# Patient Record
Sex: Male | Born: 1945 | Race: White | Hispanic: No | Marital: Single | Smoking: Former smoker
Health system: Southern US, Community
[De-identification: ages and names within clinical notes are randomized; demographics above are authoritative.]

## PROBLEM LIST (undated history)

## (undated) DIAGNOSIS — U071 COVID-19: Secondary | ICD-10-CM

## (undated) DIAGNOSIS — J189 Pneumonia, unspecified organism: Secondary | ICD-10-CM

## (undated) DIAGNOSIS — G479 Sleep disorder, unspecified: Secondary | ICD-10-CM

## (undated) DIAGNOSIS — J449 Chronic obstructive pulmonary disease, unspecified: Secondary | ICD-10-CM

## (undated) DIAGNOSIS — Z87442 Personal history of urinary calculi: Secondary | ICD-10-CM

## (undated) DIAGNOSIS — F419 Anxiety disorder, unspecified: Secondary | ICD-10-CM

## (undated) DIAGNOSIS — I10 Essential (primary) hypertension: Secondary | ICD-10-CM

## (undated) DIAGNOSIS — M109 Gout, unspecified: Secondary | ICD-10-CM

## (undated) DIAGNOSIS — K219 Gastro-esophageal reflux disease without esophagitis: Secondary | ICD-10-CM

## (undated) DIAGNOSIS — M1990 Unspecified osteoarthritis, unspecified site: Secondary | ICD-10-CM

## (undated) DIAGNOSIS — R519 Headache, unspecified: Secondary | ICD-10-CM

## (undated) DIAGNOSIS — N400 Enlarged prostate without lower urinary tract symptoms: Secondary | ICD-10-CM

## (undated) DIAGNOSIS — Z974 Presence of external hearing-aid: Secondary | ICD-10-CM

## (undated) DIAGNOSIS — E039 Hypothyroidism, unspecified: Secondary | ICD-10-CM

## (undated) DIAGNOSIS — F32A Depression, unspecified: Secondary | ICD-10-CM

## (undated) DIAGNOSIS — G629 Polyneuropathy, unspecified: Secondary | ICD-10-CM

## (undated) DIAGNOSIS — H9209 Otalgia, unspecified ear: Secondary | ICD-10-CM

## (undated) DIAGNOSIS — D126 Benign neoplasm of colon, unspecified: Secondary | ICD-10-CM

## (undated) DIAGNOSIS — E7800 Pure hypercholesterolemia, unspecified: Secondary | ICD-10-CM

## (undated) DIAGNOSIS — G4730 Sleep apnea, unspecified: Secondary | ICD-10-CM

## (undated) DIAGNOSIS — J45909 Unspecified asthma, uncomplicated: Secondary | ICD-10-CM

## (undated) HISTORY — PX: HERNIA REPAIR: SHX51

## (undated) HISTORY — PX: OTHER SURGICAL HISTORY: SHX169

## (undated) HISTORY — PX: CHOLECYSTECTOMY: SHX55

## (undated) HISTORY — PX: COLONOSCOPY: SHX174

---

## 2007-09-03 DIAGNOSIS — E7800 Pure hypercholesterolemia, unspecified: Secondary | ICD-10-CM | POA: Insufficient documentation

## 2007-09-03 DIAGNOSIS — I10 Essential (primary) hypertension: Secondary | ICD-10-CM | POA: Insufficient documentation

## 2008-01-30 DIAGNOSIS — F339 Major depressive disorder, recurrent, unspecified: Secondary | ICD-10-CM | POA: Insufficient documentation

## 2008-01-30 DIAGNOSIS — F519 Sleep disorder not due to a substance or known physiological condition, unspecified: Secondary | ICD-10-CM | POA: Insufficient documentation

## 2010-09-02 DIAGNOSIS — G4700 Insomnia, unspecified: Secondary | ICD-10-CM | POA: Insufficient documentation

## 2012-03-20 DIAGNOSIS — B369 Superficial mycosis, unspecified: Secondary | ICD-10-CM | POA: Insufficient documentation

## 2012-06-14 DIAGNOSIS — L299 Pruritus, unspecified: Secondary | ICD-10-CM | POA: Insufficient documentation

## 2013-04-20 ENCOUNTER — Emergency Department (HOSPITAL_COMMUNITY)
Admission: EM | Admit: 2013-04-20 | Discharge: 2013-04-20 | Disposition: A | Discharge status: Home / Self Care | Payer: Medicare HMO | Attending: Emergency Medicine | Admitting: Emergency Medicine

## 2013-04-20 ENCOUNTER — Encounter (HOSPITAL_COMMUNITY): Payer: Self-pay | Admitting: Emergency Medicine

## 2013-04-20 DIAGNOSIS — Z79899 Other long term (current) drug therapy: Secondary | ICD-10-CM | POA: Insufficient documentation

## 2013-04-20 DIAGNOSIS — J45909 Unspecified asthma, uncomplicated: Secondary | ICD-10-CM | POA: Insufficient documentation

## 2013-04-20 DIAGNOSIS — E785 Hyperlipidemia, unspecified: Secondary | ICD-10-CM | POA: Insufficient documentation

## 2013-04-20 DIAGNOSIS — S838X9A Sprain of other specified parts of unspecified knee, initial encounter: Secondary | ICD-10-CM | POA: Insufficient documentation

## 2013-04-20 DIAGNOSIS — I10 Essential (primary) hypertension: Secondary | ICD-10-CM | POA: Insufficient documentation

## 2013-04-20 DIAGNOSIS — W240XXA Contact with lifting devices, not elsewhere classified, initial encounter: Secondary | ICD-10-CM | POA: Insufficient documentation

## 2013-04-20 DIAGNOSIS — Y92838 Other recreation area as the place of occurrence of the external cause: Secondary | ICD-10-CM | POA: Insufficient documentation

## 2013-04-20 DIAGNOSIS — Z8669 Personal history of other diseases of the nervous system and sense organs: Secondary | ICD-10-CM | POA: Insufficient documentation

## 2013-04-20 DIAGNOSIS — S76112A Strain of left quadriceps muscle, fascia and tendon, initial encounter: Secondary | ICD-10-CM

## 2013-04-20 DIAGNOSIS — Y9239 Other specified sports and athletic area as the place of occurrence of the external cause: Secondary | ICD-10-CM | POA: Insufficient documentation

## 2013-04-20 DIAGNOSIS — Z87448 Personal history of other diseases of urinary system: Secondary | ICD-10-CM | POA: Insufficient documentation

## 2013-04-20 DIAGNOSIS — Y9389 Activity, other specified: Secondary | ICD-10-CM | POA: Insufficient documentation

## 2013-04-20 HISTORY — DX: Unspecified asthma, uncomplicated: J45.909

## 2013-04-20 HISTORY — DX: Essential (primary) hypertension: I10

## 2013-04-20 HISTORY — DX: Benign prostatic hyperplasia without lower urinary tract symptoms: N40.0

## 2013-04-20 HISTORY — DX: Polyneuropathy, unspecified: G62.9

## 2013-04-20 HISTORY — DX: Otalgia, unspecified ear: H92.09

## 2013-04-20 HISTORY — DX: Sleep disorder, unspecified: G47.9

## 2013-04-20 HISTORY — DX: Pure hypercholesterolemia, unspecified: E78.00

## 2013-04-20 MED ORDER — NAPROXEN 500 MG PO TABS: 500.0000 mg | ORAL_TABLET | Freq: Two times a day (BID) | ORAL | Status: DC

## 2013-04-20 MED ORDER — HYDROCODONE-ACETAMINOPHEN 5-325 MG PO TABS
1.0000 | ORAL_TABLET | Freq: Once | ORAL | Status: AC
  Administered 2013-04-20: 1 via ORAL
  Filled 2013-04-20: qty 1

## 2013-04-20 MED ORDER — HYDROCODONE-ACETAMINOPHEN 5-325 MG PO TABS: 1.0000 | ORAL_TABLET | Freq: Four times a day (QID) | ORAL | Status: DC | PRN

## 2013-04-20 NOTE — ED Notes (Signed)
Pt given d/c teaching by Jamison Oka, RN. Pt does not have any further questions upon d/c. Pt instructed not to drive home. Pt states he has called his sister to come get him and she will be picking him in the lobby. Pt requests a wheelchair to lobby. Pt has ace bandage applied to left leg prior to d/c and endorses he knows how to put it on and off. NAD noted upon d/c. Pt taken to waiting room via wheelchair.

## 2013-04-20 NOTE — ED Notes (Signed)
Osie Bond, PA at the bedside

## 2013-04-20 NOTE — ED Provider Notes (Signed)
History     CSN: [id number]  Arrival date & time 04/20/13  6213   First MD Initiated Contact with Patient 04/20/13 [phone]      Chief Complaint  Patient presents with  . Leg Pain    (Consider location/radiation/quality/duration/timing/severity/associated sxs/prior treatment) HPI Dakota Harper is a 67 y.o. male who reports left leg pain for 2 days. States used elliptical machine which he normally does not use at the gym 2 days ago. States since then has had pain in left quad that is worsening. 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 States pulled over and called 911. Pt reports taking ibuprofen for pain with no relief. Denies bruising or swelling to the area. No numbness or weakness to the distal leg. No other injuries.   Past Medical History  Diagnosis Date  . Hypertension   . Hypercholesteremia   . Asthma   . Sleep disorder   . BPH (benign prostatic hyperplasia)   . Ear pain   . Neuropathy     Past Surgical History  Procedure Laterality Date  . Hernia repair      History reviewed. No pertinent family history.  History  Substance Use Topics  . Smoking status: Not on file  . Smokeless tobacco: Not on file  . Alcohol Use: Not on file      Review of Systems  Constitutional: Negative for fever and chills.  Respiratory: Negative.   Cardiovascular: Negative.   Genitourinary: Negative for dysuria and flank pain.  Musculoskeletal: Positive for myalgias and arthralgias.  Skin: Negative.   Neurological: Negative for weakness and numbness.    Allergies  Sulfa antibiotics  Home Medications   Current Outpatient Rx  Name  Route  Sig  Dispense  Refill  . LISINOPRIL PO   Oral   Take 1 tablet by mouth daily.           BP 161/74  Pulse 75  Temp(Src) 97.7 F (36.5 C) (Oral)  Resp 20  SpO2 99%  Physical Exam  Nursing note and vitals  reviewed. Constitutional: He appears well-developed and well-nourished. No distress.  Eyes: Conjunctivae are normal.  Neck: Neck supple.  Cardiovascular: Normal rate, regular rhythm and normal heart sounds.   Pulmonary/Chest: Effort normal and breath sounds normal. No respiratory distress. He has no wheezes. He has no rales.  Musculoskeletal:  Normal appearing left thigh. Tender to the distal quadricept muscle and superior patella tendon. Normal hip exam. Pt is able to raise straight leg off stretcher and hold against resistance. Full rom of the knee and hip. Negative anterior and posterior drawer signs of left knee. No laxity of pain with medial or lateral strain. Normal and equal dorsal pedal pulses bilaterally. No LE swelling. No calf tenderness bilaterally.   Neurological: He is alert.  Skin: Skin is warm and dry.    ED Course  Procedures (including critical care time)  Labs Reviewed - No data to display No results found.   1. Quadriceps strain, left, initial encounter       MDM  Pt with left thigh pain. Mild tenderness over quadricept muscle. Full rom of both hips and knee joints. Doubt deep tissue infection given mild tenderness, no skin changes, no fever. Patella tendon intact. Will treat with ACE wrap, norco at home. Suspect muscle strain.   Filed Vitals:   04/20/13 0919  BP: 161/74  Pulse:  75  Temp: 97.7 F (36.5 C)  TempSrc: Oral  Resp: 20  SpO2: 99%          Lottie Mussel, PA-C 04/20/13 1635

## 2013-04-20 NOTE — ED Notes (Signed)
Pt ambulatory in room but requests wheelchair to lobby

## 2013-04-20 NOTE — ED Provider Notes (Signed)
Medical screening examination/treatment/procedure(s) were conducted as a shared visit with non-physician practitioner(s) and myself.  I personally evaluated the patient during the encounter Pt with injury to the left leg on eliptical 2 days ago.  Today pain worsened.  Pt has pain with palpation to the distal quad.  Full ROM of the knee and hip.  No signs of infection or joint problem.  Will d/c with f/u  Gwyneth Sprout, MD 04/20/13 2054

## 2013-04-20 NOTE — ED Notes (Signed)
Per EMS: Pt states that he injured his left leg on the elliptical machine 2 days prior and starting hurting yesterday. Pt states he was unable to drive his car. VSS 140 palpated HR 90 99% RA 18 RR

## 2013-04-26 ENCOUNTER — Emergency Department (HOSPITAL_COMMUNITY)
Admission: EM | Admit: 2013-04-26 | Discharge: 2013-04-26 | Disposition: A | Discharge status: Home / Self Care | Payer: Medicare HMO | Attending: Emergency Medicine | Admitting: Emergency Medicine

## 2013-04-26 ENCOUNTER — Encounter (HOSPITAL_COMMUNITY): Payer: Self-pay | Admitting: Family Medicine

## 2013-04-26 DIAGNOSIS — G479 Sleep disorder, unspecified: Secondary | ICD-10-CM | POA: Insufficient documentation

## 2013-04-26 DIAGNOSIS — R059 Cough, unspecified: Secondary | ICD-10-CM | POA: Insufficient documentation

## 2013-04-26 DIAGNOSIS — Y93B9 Activity, other involving muscle strengthening exercises: Secondary | ICD-10-CM | POA: Insufficient documentation

## 2013-04-26 DIAGNOSIS — Z79899 Other long term (current) drug therapy: Secondary | ICD-10-CM | POA: Insufficient documentation

## 2013-04-26 DIAGNOSIS — X500XXA Overexertion from strenuous movement or load, initial encounter: Secondary | ICD-10-CM | POA: Insufficient documentation

## 2013-04-26 DIAGNOSIS — IMO0001 Reserved for inherently not codable concepts without codable children: Secondary | ICD-10-CM | POA: Insufficient documentation

## 2013-04-26 DIAGNOSIS — Y9289 Other specified places as the place of occurrence of the external cause: Secondary | ICD-10-CM | POA: Insufficient documentation

## 2013-04-26 DIAGNOSIS — R05 Cough: Secondary | ICD-10-CM

## 2013-04-26 DIAGNOSIS — Z8669 Personal history of other diseases of the nervous system and sense organs: Secondary | ICD-10-CM | POA: Insufficient documentation

## 2013-04-26 DIAGNOSIS — IMO0002 Reserved for concepts with insufficient information to code with codable children: Secondary | ICD-10-CM | POA: Insufficient documentation

## 2013-04-26 DIAGNOSIS — Z7982 Long term (current) use of aspirin: Secondary | ICD-10-CM | POA: Insufficient documentation

## 2013-04-26 DIAGNOSIS — N400 Enlarged prostate without lower urinary tract symptoms: Secondary | ICD-10-CM | POA: Insufficient documentation

## 2013-04-26 DIAGNOSIS — I10 Essential (primary) hypertension: Secondary | ICD-10-CM | POA: Insufficient documentation

## 2013-04-26 DIAGNOSIS — J45909 Unspecified asthma, uncomplicated: Secondary | ICD-10-CM | POA: Insufficient documentation

## 2013-04-26 DIAGNOSIS — S76112D Strain of left quadriceps muscle, fascia and tendon, subsequent encounter: Secondary | ICD-10-CM

## 2013-04-26 DIAGNOSIS — G589 Mononeuropathy, unspecified: Secondary | ICD-10-CM | POA: Insufficient documentation

## 2013-04-26 DIAGNOSIS — E7800 Pure hypercholesterolemia, unspecified: Secondary | ICD-10-CM | POA: Insufficient documentation

## 2013-04-26 MED ORDER — LIDOCAINE 5 % EX PTCH: 1.0000 | MEDICATED_PATCH | CUTANEOUS | Status: DC

## 2013-04-26 MED ORDER — CYCLOBENZAPRINE HCL 10 MG PO TABS: 10.0000 mg | ORAL_TABLET | Freq: Two times a day (BID) | ORAL | Status: DC | PRN

## 2013-04-26 NOTE — ED Provider Notes (Signed)
History     CSN: [id number]  Arrival date & time 04/26/13  0808   First MD Initiated Contact with Patient 04/26/13 [phone]      Chief Complaint  Patient presents with  . Leg Pain    (Consider location/radiation/quality/duration/timing/severity/associated sxs/prior treatment) HPI  Dakota Harper is a 67 y.o. male complaining of left anterior thigh pain onset approximately a week ago after strenuous exercise. Patient was seen and diagnosed with quadriceps sprain. He's been taking all Norco with little relief. Patient denies any numbness paresthesia, difficulty ambulating, new trauma or strenuous activity. Patient also reports that he has had a dry cough for several months. She denies any chest pain, shortness of breath, fever, nausea vomiting, change in bowel or bladder habits.  Past Medical History  Diagnosis Date  . Hypertension   . Hypercholesteremia   . Asthma   . Sleep disorder   . BPH (benign prostatic hyperplasia)   . Ear pain   . Neuropathy     Past Surgical History  Procedure Laterality Date  . Hernia repair      History reviewed. No pertinent family history.  History  Substance Use Topics  . Smoking status: Never Smoker   . Smokeless tobacco: Not on file  . Alcohol Use: No      Review of Systems  Constitutional:       Negative except as described in HPI  HENT:       Negative except as described in HPI  Respiratory: Positive for cough.        Negative except as described in HPI  Cardiovascular:       Negative except as described in HPI  Gastrointestinal: Negative for nausea, vomiting and diarrhea.       Negative except as described in HPI  Genitourinary:       Negative except as described in HPI  Musculoskeletal: Positive for myalgias.       Negative except as described in HPI  Skin:       Negative except as described in HPI  Neurological:       Negative except as described in HPI  All other systems reviewed and are negative.    Allergies  Saw  palmetto and Sulfa antibiotics  Home Medications   Current Outpatient Rx  Name  Route  Sig  Dispense  Refill  . acetaminophen (TYLENOL) 325 MG tablet   Oral   Take 650 mg by mouth every 6 (six) hours as needed for pain.         Marland Kitchen albuterol (PROVENTIL HFA;VENTOLIN HFA) 108 (90 BASE) MCG/ACT inhaler   Inhalation   Inhale 1 puff into the lungs every 6 (six) hours as needed for wheezing.         . Artificial Tear Ointment (DRY EYES OP)   Both Eyes   Place 1 drop into both eyes as needed (dry eyes).         Marland Kitchen aspirin EC 81 MG tablet   Oral   Take 81 mg by mouth daily.         Marland Kitchen atorvastatin (LIPITOR) 80 MG tablet   Oral   Take 80 mg by mouth daily.         . cetirizine (ZYRTEC) 10 MG tablet   Oral   Take 10 mg by mouth daily.         . clobetasol cream (TEMOVATE) 0.05 %   Topical   Apply 1 application topically 2 (two) times daily.         Marland Kitchen  DULoxetine (CYMBALTA) 60 MG capsule   Oral   Take 60 mg by mouth daily.         . Fluticasone Propionate  HFA (FLOVENT HFA IN)   Inhalation   Inhale 2 sprays into the lungs 2 (two) times daily.         Marland Kitchen gabapentin (NEURONTIN) 800 MG tablet   Oral   Take 1,600 mg by mouth 3 (three) times daily.         Marland Kitchen HYDROcodone-acetaminophen (NORCO) 5-325 MG per tablet   Oral   Take 1 tablet by mouth every 6 (six) hours as needed for pain.   20 tablet   0   . ibuprofen (ADVIL,MOTRIN) 200 MG tablet   Oral   Take 1,000 mg by mouth every 6 (six) hours as needed for pain.         Marland Kitchen lisinopril (PRINIVIL,ZESTRIL) 5 MG tablet   Oral   Take 5 mg by mouth daily.         . naproxen (NAPROSYN) 500 MG tablet   Oral   Take 1 tablet (500 mg total) by mouth 2 (two) times daily.   30 tablet   0   . PRESCRIPTION MEDICATION   Apply externally   Apply 1 application topically 2 (two) times daily as needed (dry skin). triamcinolone 1%  Menthol and camfer 1%         . QUEtiapine (SEROQUEL) 200 MG tablet   Oral   Take 200  mg by mouth at bedtime.         . tamsulosin (FLOMAX) 0.4 MG CAPS   Oral   Take 0.8 mg by mouth daily.         . urea (CARMOL) 40 % CREA   Topical   Apply 1 application topically daily.         Marland Kitchen lidocaine (LIDODERM) 5 %   Transdermal   Place 1 patch onto the skin daily. Remove & Discard patch within 12 hours or as directed by MD   5 patch   0     BP 150/81  Pulse 90  Temp(Src) 97.3 F (36.3 C)  Resp 18  SpO2 99%  Physical Exam  Nursing note and vitals reviewed. Constitutional: He is oriented to person, place, and time. He appears well-developed and well-nourished. No distress.  HENT:  Head: Normocephalic.  Mouth/Throat: Oropharynx is clear and moist.  Eyes: Conjunctivae and EOM are normal. Pupils are equal, round, and reactive to light.  Neck: Normal range of motion. Neck supple.  Cardiovascular: Normal rate, regular rhythm and normal heart sounds.   Pulmonary/Chest: Effort normal and breath sounds normal. No stridor. No respiratory distress. He has no wheezes. He has no rales. He exhibits no tenderness.  Abdominal: Soft. Bowel sounds are normal. There is no tenderness. There is no rebound and no guarding.  Musculoskeletal: Normal range of motion.  Diffusely tender to palpation of medial quadriceps insertion. Excellent range of motion to both hip and knee.  Lymphadenopathy:    He has no cervical adenopathy.  Neurological: He is alert and oriented to person, place, and time.  Psychiatric: He has a normal mood and affect.    ED Course  Procedures (including critical care time)  Labs Reviewed - No data to display No results found.   1. Quadriceps strain, left, subsequent encounter   2. Cough       MDM   Filed Vitals:   04/26/13 0820  BP: 150/81  Pulse: 90  Temp: 97.3 F (36.3 C)  Resp: 18  SpO2: 99%     Dakota Harper is a 67 y.o. male with quadriceps strain, I will give the patient must rule out Sirs and topical lidocaine. Patient will  followup with his orthopedist he has an appointment next week. I have asked him to follow with his primary care Dr. For his dry cough which has been persistent for several months. Lung sounds are clear and I see any reason for intervention at this time.   Pt is hemodynamically stable, appropriate for, and amenable to discharge at this time. Pt verbalized understanding and agrees with care plan. Outpatient follow-up and specific return precautions discussed.    Discharge Medication List as of 04/26/2013  8:34 AM    START taking these medications   Details  lidocaine (LIDODERM) 5 % Place 1 patch onto the skin daily. Remove & Discard patch within 12 hours or as directed by MD, Starting 04/26/2013, Until Discontinued, [employer], PA-C 04/27/13 [phone]

## 2013-04-26 NOTE — ED Notes (Addendum)
Per pt was dx with muscle strain a week ago and was unable to stay off of his leg but not better. sts pain meds not helping. sts also has been congested x 2 months.

## 2013-04-29 NOTE — ED Provider Notes (Signed)
Medical screening examination/treatment/procedure(s) were performed by non-physician practitioner and as supervising physician I was immediately available for consultation/collaboration.   Jian Hodgman M Wallice Granville, DO 04/29/13 1815 

## 2013-07-18 DIAGNOSIS — R053 Chronic cough: Secondary | ICD-10-CM | POA: Insufficient documentation

## 2013-08-30 DIAGNOSIS — J45909 Unspecified asthma, uncomplicated: Secondary | ICD-10-CM | POA: Insufficient documentation

## 2013-09-18 DIAGNOSIS — M109 Gout, unspecified: Secondary | ICD-10-CM | POA: Insufficient documentation

## 2013-09-22 DIAGNOSIS — D352 Benign neoplasm of pituitary gland: Secondary | ICD-10-CM | POA: Insufficient documentation

## 2013-09-26 DIAGNOSIS — E669 Obesity, unspecified: Secondary | ICD-10-CM | POA: Insufficient documentation

## 2013-10-20 DIAGNOSIS — E291 Testicular hypofunction: Secondary | ICD-10-CM | POA: Insufficient documentation

## 2014-01-09 DIAGNOSIS — H0288A Meibomian gland dysfunction right eye, upper and lower eyelids: Secondary | ICD-10-CM | POA: Insufficient documentation

## 2014-01-09 DIAGNOSIS — H25019 Cortical age-related cataract, unspecified eye: Secondary | ICD-10-CM | POA: Insufficient documentation

## 2014-01-09 DIAGNOSIS — H11153 Pinguecula, bilateral: Secondary | ICD-10-CM | POA: Insufficient documentation

## 2014-01-14 DIAGNOSIS — K8020 Calculus of gallbladder without cholecystitis without obstruction: Secondary | ICD-10-CM | POA: Insufficient documentation

## 2014-07-02 DIAGNOSIS — M7550 Bursitis of unspecified shoulder: Secondary | ICD-10-CM | POA: Insufficient documentation

## 2014-07-03 DIAGNOSIS — M79604 Pain in right leg: Secondary | ICD-10-CM | POA: Insufficient documentation

## 2014-07-03 DIAGNOSIS — R079 Chest pain, unspecified: Secondary | ICD-10-CM | POA: Insufficient documentation

## 2014-07-03 DIAGNOSIS — I8390 Asymptomatic varicose veins of unspecified lower extremity: Secondary | ICD-10-CM | POA: Insufficient documentation

## 2014-08-14 ENCOUNTER — Ambulatory Visit: Payer: Self-pay | Admitting: Podiatry

## 2014-11-19 DIAGNOSIS — M722 Plantar fascial fibromatosis: Secondary | ICD-10-CM | POA: Insufficient documentation

## 2015-06-03 ENCOUNTER — Ambulatory Visit: Payer: Self-pay | Admitting: Family Medicine

## 2015-06-24 DIAGNOSIS — L309 Dermatitis, unspecified: Secondary | ICD-10-CM | POA: Insufficient documentation

## 2015-08-20 ENCOUNTER — Emergency Department: Payer: Commercial Managed Care - HMO

## 2015-08-20 ENCOUNTER — Emergency Department
Admission: EM | Admit: 2015-08-20 | Discharge: 2015-08-20 | Disposition: A | Discharge status: Home / Self Care | Payer: Commercial Managed Care - HMO | Attending: Emergency Medicine | Admitting: Emergency Medicine

## 2015-08-20 DIAGNOSIS — I10 Essential (primary) hypertension: Secondary | ICD-10-CM | POA: Insufficient documentation

## 2015-08-20 DIAGNOSIS — Z791 Long term (current) use of non-steroidal anti-inflammatories (NSAID): Secondary | ICD-10-CM | POA: Insufficient documentation

## 2015-08-20 DIAGNOSIS — Z7951 Long term (current) use of inhaled steroids: Secondary | ICD-10-CM | POA: Insufficient documentation

## 2015-08-20 DIAGNOSIS — R1032 Left lower quadrant pain: Secondary | ICD-10-CM | POA: Diagnosis present

## 2015-08-20 DIAGNOSIS — Z79899 Other long term (current) drug therapy: Secondary | ICD-10-CM | POA: Insufficient documentation

## 2015-08-20 DIAGNOSIS — F552 Abuse of laxatives: Secondary | ICD-10-CM

## 2015-08-20 DIAGNOSIS — Z7982 Long term (current) use of aspirin: Secondary | ICD-10-CM | POA: Diagnosis not present

## 2015-08-20 LAB — BASIC METABOLIC PANEL
Anion gap: 5 (ref 5–15)
BUN: 15 mg/dL (ref 6–20)
CALCIUM: 9.2 mg/dL (ref 8.9–10.3)
CO2: 28 mmol/L (ref 22–32)
CREATININE: 1.02 mg/dL (ref 0.61–1.24)
Chloride: 107 mmol/L (ref 101–111)
GFR calc Af Amer: >60 mL/min (ref >60)
GLUCOSE: 113 mg/dL — AB (ref 65–99)
POTASSIUM: 4 mmol/L (ref 3.5–5.1)
SODIUM: 140 mmol/L (ref 135–145)

## 2015-08-20 LAB — URINALYSIS COMPLETE WITH MICROSCOPIC (ARMC ONLY)
BILIRUBIN URINE: NEGATIVE
GLUCOSE, UA: NEGATIVE mg/dL
Hgb urine dipstick: NEGATIVE
Leukocytes, UA: NEGATIVE
Nitrite: NEGATIVE
PH: 6 (ref 5.0–8.0)
Protein, ur: 30 mg/dL — AB
Specific Gravity, Urine: 1.049 — ABNORMAL HIGH (ref 1.005–1.030)

## 2015-08-20 LAB — CBC WITH DIFFERENTIAL/PLATELET
Basophils Absolute: 0 10*3/uL (ref 0–0.1)
Basophils Relative: 0 %
EOS ABS: 0.1 10*3/uL (ref 0–0.7)
EOS PCT: 2 %
HCT: 44.3 % (ref 40.0–52.0)
Hemoglobin: 14.8 g/dL (ref 13.0–18.0)
LYMPHS ABS: 0.9 10*3/uL — AB (ref 1.0–3.6)
Lymphocytes Relative: 12 %
MCH: 30.5 pg (ref 26.0–34.0)
MCHC: 33.5 g/dL (ref 32.0–36.0)
MCV: 91.2 fL (ref 80.0–100.0)
MONO ABS: 0.6 10*3/uL (ref 0.2–1.0)
Monocytes Relative: 7 %
Neutro Abs: 6.1 10*3/uL (ref 1.4–6.5)
Neutrophils Relative %: 79 %
PLATELETS: 192 10*3/uL (ref 150–440)
RBC: 4.86 MIL/uL (ref 4.40–5.90)
RDW: 13.1 % (ref 11.5–14.5)
WBC: 7.7 10*3/uL (ref 3.8–10.6)

## 2015-08-20 MED ORDER — HYDROMORPHONE HCL 1 MG/ML IJ SOLN
0.5000 mg | INTRAMUSCULAR | Status: DC | PRN
  Filled 2015-08-20: qty 1

## 2015-08-20 MED ORDER — METOCLOPRAMIDE HCL 5 MG/ML IJ SOLN
10.0000 mg | Freq: Once | INTRAMUSCULAR | Status: AC
  Administered 2015-08-20: 10 mg via INTRAVENOUS
  Filled 2015-08-20: qty 2

## 2015-08-20 MED ORDER — IOHEXOL 300 MG/ML  SOLN
100.0000 mL | Freq: Once | INTRAMUSCULAR | Status: AC | PRN
  Administered 2015-08-20: 100 mL via INTRAVENOUS

## 2015-08-20 MED ORDER — FENTANYL CITRATE (PF) 100 MCG/2ML IJ SOLN
50.0000 ug | Freq: Once | INTRAMUSCULAR | Status: AC
  Administered 2015-08-20: 50 ug via INTRAVENOUS
  Filled 2015-08-20: qty 2

## 2015-08-20 MED ORDER — SODIUM CHLORIDE 0.9 % IV BOLUS (SEPSIS)
1000.0000 mL | Freq: Once | INTRAVENOUS | Status: AC
  Administered 2015-08-20: 1000 mL via INTRAVENOUS

## 2015-08-20 MED ORDER — DICYCLOMINE HCL 20 MG PO TABS: 20.0000 mg | ORAL_TABLET | Freq: Three times a day (TID) | ORAL | Status: DC | PRN

## 2015-08-20 MED ORDER — IOHEXOL 240 MG/ML SOLN
25.0000 mL | Freq: Once | INTRAMUSCULAR | Status: DC | PRN
  Administered 2015-08-20: 25 mL via ORAL
  Filled 2015-08-20: qty 50

## 2015-08-20 NOTE — ED Notes (Signed)
Pt to CT via stretcher

## 2015-08-20 NOTE — ED Notes (Signed)
Pt called out, c/o 10/10 pain in LLQ an he is requesting pain medication,.

## 2015-08-20 NOTE — Discharge Instructions (Signed)
Your CT scan looked good. You do not have diverticulitis or Crohn's disease. You do not have significant distention or significant constipation. Your pain is likely due to the constipation you had and the aggressive bowel regimen with multiple stool softeners and laxatives. Simplify this regimen-simply take MiraLAX once a day for the next 3 days and then stop. She may resume MiraLAX if needed. Take Bentyl for abdominal spasm as needed. Follow-up with your regular doctor this coming week. Return to the emergency department if you have worsening pain or other urgent concerns.  Abdominal Pain, Adult Many things can cause abdominal pain. Usually, abdominal pain is not caused by a disease and will improve without treatment. It can often be observed and treated at home. Your health care provider will do a physical exam and possibly order blood tests and X-rays to help determine the seriousness of your pain. However, in many cases, more time must pass before a clear cause of the pain can be found. Before that point, your health care provider may not know if you need more testing or further treatment. HOME CARE INSTRUCTIONS Monitor your abdominal pain for any changes. The following actions may help to alleviate any discomfort you are experiencing:  Only take over-the-counter or prescription medicines as directed by your health care provider.  Do not take laxatives unless directed to do so by your health care provider.  Try a clear liquid diet (broth, tea, or water) as directed by your health care provider. Slowly move to a bland diet as tolerated. SEEK MEDICAL CARE IF:  You have unexplained abdominal pain.  You have abdominal pain associated with nausea or diarrhea.  You have pain when you urinate or have a bowel movement.  You experience abdominal pain that wakes you in the night.  You have abdominal pain that is worsened or improved by eating food.  You have abdominal pain that is worsened with  eating fatty foods.  You have a fever. SEEK IMMEDIATE MEDICAL CARE IF:  Your pain does not go away within 2 hours.  You keep throwing up (vomiting).  Your pain is felt only in portions of the abdomen, such as the right side or the left lower portion of the abdomen.  You pass bloody or black tarry stools. MAKE SURE YOU:  Understand these instructions.  Will watch your condition.  Will get help right away if you are not doing well or get worse.   This information is not intended to replace advice given to you by your health care provider. Make sure you discuss any questions you have with your health care provider.   Document Released: 08/02/2005 Document Revised: 07/14/2015 Document Reviewed: 07/02/2013 Elsevier Interactive Patient Education [employer].

## 2015-08-20 NOTE — ED Provider Notes (Signed)
[facility] Emergency Department Provider Note  ____________________________________________  Time seen:  1345  I have reviewed the triage vital signs and the nursing notes.   HISTORY  Chief Complaint Fecal Impaction  abdominal pain, left lower quadrant    HPI Dakota Harper is a 69 y.o. male who reports she has been having difficulty with constipation for proximally 2 weeks. He believes this is due to the use of tramadol and Percocet for tension headache and dental pain. He went to an urgent care center approximately 2 weeks ago due to discomfort. An x-ray and blood tests were done at that time and they reported he had constipation and advised him to take MiraLAX. He took 4 doses of MiraLAX one day and 3 doses the next. He reports he did have bowel movements but has continued to have discomfort in the feeling that he needs to stool. He has used fleets enemas with positive results as well as area He has also taken Colace and senna. He went to see his regular doctor at [facility] yesterday. His doctor prescribed lactulose for him. He has taken 2 30 milliliter doses and reports he had a large bowel movement after this as well as. The patient reports she had a normal bowel movement this morning.  Despite multiple bowel movements, the patient remains focused on the possibility of fecal impaction and constipation. More of concern is his ongoing discomfort in the left lower quadrant of his abdomen. He denies any history of diverticulitis. He has had a colonoscopy in the past year which she reports showed polyps but no other pathology.   He denies any fever. He denies any nausea or vomiting.     Past Medical History  Diagnosis Date  . Hypertension   . Hypercholesteremia   . Asthma   . Sleep disorder   . BPH (benign prostatic hyperplasia)   . Ear pain   . Neuropathy ([place])     There are no active problems to display for this patient.   Past Surgical History   Procedure Laterality Date  . Hernia repair      Current Outpatient Rx  Name  Route  Sig  Dispense  Refill  . acetaminophen (TYLENOL) 325 MG tablet   Oral   Take 650 mg by mouth every 6 (six) hours as needed for pain.         Marland Kitchen albuterol (PROVENTIL HFA;VENTOLIN HFA) 108 (90 BASE) MCG/ACT inhaler   Inhalation   Inhale 1 puff into the lungs every 6 (six) hours as needed for wheezing.         . Artificial Tear Ointment (DRY EYES OP)   Both Eyes   Place 1 drop into both eyes as needed (dry eyes).         Marland Kitchen aspirin EC 81 MG tablet   Oral   Take 81 mg by mouth daily.         Marland Kitchen atorvastatin (LIPITOR) 80 MG tablet   Oral   Take 80 mg by mouth daily.         . cetirizine (ZYRTEC) 10 MG tablet   Oral   Take 10 mg by mouth daily.         . clobetasol cream (TEMOVATE) 0.05 %   Topical   Apply 1 application topically 2 (two) times daily.         . cyclobenzaprine (FLEXERIL) 10 MG tablet   Oral   Take 1 tablet (10 mg total) by mouth 2 (  two) times daily as needed for muscle spasms.   20 tablet   0   . dicyclomine (BENTYL) 20 MG tablet   Oral   Take 1 tablet (20 mg total) by mouth 3 (three) times daily as needed for spasms.   12 tablet   0   . DULoxetine (CYMBALTA) 60 MG capsule   Oral   Take 60 mg by mouth daily.         . Fluticasone Propionate  HFA (FLOVENT HFA IN)   Inhalation   Inhale 2 sprays into the lungs 2 (two) times daily.         Marland Kitchen gabapentin (NEURONTIN) 800 MG tablet   Oral   Take 1,600 mg by mouth 3 (three) times daily.         Marland Kitchen HYDROcodone-acetaminophen (NORCO) 5-325 MG per tablet   Oral   Take 1 tablet by mouth every 6 (six) hours as needed for pain.   20 tablet   0   . ibuprofen (ADVIL,MOTRIN) 200 MG tablet   Oral   Take 1,000 mg by mouth every 6 (six) hours as needed for pain.         Marland Kitchen lidocaine (LIDODERM) 5 %   Transdermal   Place 1 patch onto the skin daily. Remove & Discard patch within 12 hours or as directed by MD    5 patch   0   . lisinopril (PRINIVIL,ZESTRIL) 5 MG tablet   Oral   Take 5 mg by mouth daily.         . naproxen (NAPROSYN) 500 MG tablet   Oral   Take 1 tablet (500 mg total) by mouth 2 (two) times daily.   30 tablet   0   . PRESCRIPTION MEDICATION   Apply externally   Apply 1 application topically 2 (two) times daily as needed (dry skin). triamcinolone 1%  Menthol and camfer 1%         . QUEtiapine (SEROQUEL) 200 MG tablet   Oral   Take 200 mg by mouth at bedtime.         . tamsulosin (FLOMAX) 0.4 MG CAPS   Oral   Take 0.8 mg by mouth daily.         . urea (CARMOL) 40 % CREA   Topical   Apply 1 application topically daily.           Allergies Saw palmetto and Sulfa antibiotics  History reviewed. No pertinent family history.  Social History Social History  Substance Use Topics  . Smoking status: Never Smoker   . Smokeless tobacco: None  . Alcohol Use: No    Review of Systems  Constitutional: Negative for fever. ENT: Negative for sore throat. Cardiovascular: Negative for chest pain. Respiratory: Negative for cough. Gastrointestinal: Positive for abdominal pain with a focus of concern of constipation.. Genitourinary: Negative for dysuria. Musculoskeletal: No myalgias or injuries. Skin: Negative for rash. Neurological: Negative for paresthesia or weakness   10-point ROS otherwise negative.  ____________________________________________   PHYSICAL EXAM:  VITAL SIGNS: ED Triage Vitals  Enc Vitals Group     BP 08/20/15 1240 145/75 mmHg     Pulse Rate 08/20/15 1240 103     Resp 08/20/15 1240 16     Temp 08/20/15 1240 98.2 F (36.8 C)     Temp Source 08/20/15 1240 Oral     SpO2 08/20/15 1240 100 %     Weight 08/20/15 1240 204 lb (92.534 kg)     Height  08/20/15 1240 5\' 8"  (1.727 m)     Head Cir --      Peak Flow --      Pain Score 08/20/15 1241 10     Pain Loc --      Pain Edu? --      Excl. in [place]? --     Constitutional: Alert and  oriented. Well appearing and in no distress. ENT   Head: Normocephalic and atraumatic.   Nose: No congestion/rhinnorhea.    Cardiovascular: Normal rate, regular rhythm, no murmur noted Respiratory:  Normal respiratory effort, no tachypnea.    Breath sounds are clear and equal bilaterally.  Gastrointestinal: Soft , tenderness in the left lower quadrant. No rebound. No distention.  Back: No muscle spasm, no tenderness, no CVA tenderness. Musculoskeletal: No deformity noted. Nontender with normal range of motion in all extremities.  No noted edema. Neurologic:  Normal speech and language. No gross focal neurologic deficits are appreciated.  Skin:  Skin is warm, dry. No rash noted. Psychiatric: Mood and affect are normal. Speech and behavior are normal.  ____________________________________________    LABS (pertinent positives/negatives)  Labs Reviewed  CBC WITH DIFFERENTIAL/PLATELET - Abnormal; Notable for the following:    Lymphs Abs 0.9 (*)    All other components within normal limits  BASIC METABOLIC PANEL - Abnormal; Notable for the following:    Glucose, Bld 113 (*)    All other components within normal limits  URINALYSIS COMPLETEWITH MICROSCOPIC ([facility] ONLY) - Abnormal; Notable for the following:    Color, Urine YELLOW (*)    APPearance CLEAR (*)    Ketones, ur TRACE (*)    Specific Gravity, Urine 1.049 (*)    Protein, ur 30 (*)    Bacteria, UA FEW (*)    Squamous Epithelial / LPF 0-5 (*)    All other components within normal limits     ____________________________________________    RADIOLOGY  CT abdomen and pelvis:  IMPRESSION: No acute findings within the abdomen or pelvis.  Moderately enlarged prostate.  Tiny nonobstructive calculi in lower pole of left kidney in urinary bladder. No evidence of ureteral calculi or hydronephrosis.  ____________________________________________  ____________________________________________   INITIAL IMPRESSION /  ASSESSMENT AND PLAN / ED COURSE  Pertinent labs & imaging results that were available during my care of the patient were reviewed by me and considered in my medical decision making (see chart for details).  Pleasant alert 69 year old male in no acute distress. He is very focused on his bowel pattern and on the possibility of constipation, however he has had positive bowel movements with both fleets enemas, lactulose, and a normal bowel movement this morning without any other immediate intervention. I do not think he has a fecal impaction or even constipation and the stage. I'm more concerned about his pain, left lower quadrant, and the possibility of diverticulitis or other pathology. We will do a CT scan. Patient and I discussed this and he agrees with this plan.  I will treat his mild discomfort currently with 50 g of fentanyl and 10 mg of Reglan.  ----------------------------------------- 5:02 PM on 08/20/2015 -----------------------------------------  Patient complained of additional pain. We treated him with 0.5 mg of Dilaudid with an order for repeat as needed. The patient does ambulate in the emergency department and appears to be in no acute distress.  ----------------------------------------- 5:22 PM on 08/20/2015 -----------------------------------------  CT scan shows no acute findings. There is no evidence of diverticulitis. There is no evidence of obstruction, inflammatory process,  or colonic distention.  I believe this patient's discomfort has come from what was notable constipation followed by aggressive bowel regimen. I will advise him to continue to take MiraLAX once a day and taper off and follow-up with his primary physician.   ____________________________________________   FINAL CLINICAL IMPRESSION(S) / ED DIAGNOSES  Final diagnoses:  Left lower quadrant pain  Laxative abuse      Ahmed Prima, MD 08/20/15 1732

## 2015-08-20 NOTE — ED Notes (Signed)
08/07/15  Patient went to [facility] urgent care and was diagnosed with fecal impaction secondary to tramadol use. Since 10/1 patient has had numerous treatment for severe constipation.  Patient listed that he has had enemas, mira lax, stool softeners (colace), magnesium citrate, and lactulose.  Patient continues to complain of not have stools.  Patient did verbalize that he had abdominal x-ray 10-1 at [facility] urgent care that showed impaction, but no obstruction. Patient denies nausea and vomiting.  Patient major complaint is pain to left lower quadrant of abdomen. Last reported normal BM was today.

## 2015-09-24 DIAGNOSIS — K5904 Chronic idiopathic constipation: Secondary | ICD-10-CM | POA: Insufficient documentation

## 2015-10-01 ENCOUNTER — Emergency Department: Payer: Medicare PPO

## 2015-10-01 ENCOUNTER — Encounter: Payer: Self-pay | Admitting: Emergency Medicine

## 2015-10-01 ENCOUNTER — Emergency Department
Admission: EM | Admit: 2015-10-01 | Discharge: 2015-10-02 | Disposition: A | Discharge status: Home / Self Care | Payer: Medicare PPO | Attending: Student | Admitting: Student

## 2015-10-01 DIAGNOSIS — K5900 Constipation, unspecified: Secondary | ICD-10-CM | POA: Insufficient documentation

## 2015-10-01 DIAGNOSIS — I10 Essential (primary) hypertension: Secondary | ICD-10-CM | POA: Insufficient documentation

## 2015-10-01 DIAGNOSIS — R109 Unspecified abdominal pain: Secondary | ICD-10-CM | POA: Diagnosis present

## 2015-10-01 DIAGNOSIS — Z79899 Other long term (current) drug therapy: Secondary | ICD-10-CM | POA: Diagnosis not present

## 2015-10-01 DIAGNOSIS — Z791 Long term (current) use of non-steroidal anti-inflammatories (NSAID): Secondary | ICD-10-CM | POA: Diagnosis not present

## 2015-10-01 DIAGNOSIS — Z7982 Long term (current) use of aspirin: Secondary | ICD-10-CM | POA: Diagnosis not present

## 2015-10-01 DIAGNOSIS — Z7951 Long term (current) use of inhaled steroids: Secondary | ICD-10-CM | POA: Diagnosis not present

## 2015-10-01 DIAGNOSIS — R1084 Generalized abdominal pain: Secondary | ICD-10-CM

## 2015-10-01 LAB — CBC WITH DIFFERENTIAL/PLATELET
Basophils Absolute: 0.1 10*3/uL (ref 0–0.1)
Basophils Relative: 1 %
EOS ABS: 0.4 10*3/uL (ref 0–0.7)
Eosinophils Relative: 4 %
HCT: 46.1 % (ref 40.0–52.0)
HEMOGLOBIN: 15.6 g/dL (ref 13.0–18.0)
LYMPHS ABS: 1.6 10*3/uL (ref 1.0–3.6)
Lymphocytes Relative: 18 %
MCH: 31.2 pg (ref 26.0–34.0)
MCHC: 33.8 g/dL (ref 32.0–36.0)
MCV: 92.4 fL (ref 80.0–100.0)
MONOS PCT: 10 %
Monocytes Absolute: 0.9 10*3/uL (ref 0.2–1.0)
NEUTROS ABS: 5.8 10*3/uL (ref 1.4–6.5)
NEUTROS PCT: 67 %
Platelets: 252 10*3/uL (ref 150–440)
RBC: 4.99 MIL/uL (ref 4.40–5.90)
RDW: 13.7 % (ref 11.5–14.5)
WBC: 8.7 10*3/uL (ref 3.8–10.6)

## 2015-10-01 LAB — BASIC METABOLIC PANEL
Anion gap: 6 (ref 5–15)
BUN: 18 mg/dL (ref 6–20)
CHLORIDE: 106 mmol/L (ref 101–111)
CO2: 28 mmol/L (ref 22–32)
CREATININE: 1.08 mg/dL (ref 0.61–1.24)
Calcium: 9.8 mg/dL (ref 8.9–10.3)
GFR calc non Af Amer: >60 mL/min (ref >60)
Glucose, Bld: 128 mg/dL — ABNORMAL HIGH (ref 65–99)
Potassium: 3.9 mmol/L (ref 3.5–5.1)
Sodium: 140 mmol/L (ref 135–145)

## 2015-10-01 NOTE — ED Provider Notes (Signed)
[facility] Emergency Department Provider Note  ____________________________________________  Time seen: Approximately 11:33 PM  I have reviewed the triage vital signs and the nursing notes.   HISTORY  Chief Complaint Abdominal Pain    HPI Dakota Harper is a 69 y.o. male with hypertension, hyperlipidemia, asthma since for evaluation of 2 months gradual onset diffuse abdominal pain, constant since onset, no modifying factors. Patient was seen here 1 month ago for similar symptoms had a reassuring CT scan of the abdomen and pelvis and was discharged with MiraLAX for constipation which was thought to be associated with narcotic use. He was seen by his GI doctor at [facility] who gave him Bentyl which she also found to be constipating and who recommended he discontinue his Seroquel and all other constipating medications. He reports his pain has been persistent. He had a 6 vessel bowel movement today with the help of an enema. He denies any nausea, vomiting, diarrhea, fevers or chills. No chest pain or difficulty breathing. Currently his pain is moderate.   Past Medical History  Diagnosis Date  . Hypertension   . Hypercholesteremia   . Asthma   . Sleep disorder   . BPH (benign prostatic hyperplasia)   . Ear pain   . Neuropathy ([place])     There are no active problems to display for this patient.   Past Surgical History  Procedure Laterality Date  . Hernia repair    . Cholecystectomy      Current Outpatient Rx  Name  Route  Sig  Dispense  Refill  . acetaminophen (TYLENOL) 325 MG tablet   Oral   Take 650 mg by mouth every 6 (six) hours as needed for pain.         Marland Kitchen albuterol (PROVENTIL HFA;VENTOLIN HFA) 108 (90 BASE) MCG/ACT inhaler   Inhalation   Inhale 1 puff into the lungs every 6 (six) hours as needed for wheezing.         . Artificial Tear Ointment (DRY EYES OP)   Both Eyes   Place 1 drop into both eyes as needed (dry eyes).         Marland Kitchen aspirin  EC 81 MG tablet   Oral   Take 81 mg by mouth daily.         Marland Kitchen atorvastatin (LIPITOR) 80 MG tablet   Oral   Take 80 mg by mouth daily.         . cetirizine (ZYRTEC) 10 MG tablet   Oral   Take 10 mg by mouth daily.         . clobetasol cream (TEMOVATE) 0.05 %   Topical   Apply 1 application topically 2 (two) times daily.         . cyclobenzaprine (FLEXERIL) 10 MG tablet   Oral   Take 1 tablet (10 mg total) by mouth 2 (two) times daily as needed for muscle spasms.   20 tablet   0   . dicyclomine (BENTYL) 20 MG tablet   Oral   Take 1 tablet (20 mg total) by mouth 3 (three) times daily as needed for spasms.   12 tablet   0   . DULoxetine (CYMBALTA) 60 MG capsule   Oral   Take 60 mg by mouth daily.         . Fluticasone Propionate  HFA (FLOVENT HFA IN)   Inhalation   Inhale 2 sprays into the lungs 2 (two) times daily.         Marland Kitchen  gabapentin (NEURONTIN) 800 MG tablet   Oral   Take 1,600 mg by mouth 3 (three) times daily.         Marland Kitchen HYDROcodone-acetaminophen (NORCO) 5-325 MG per tablet   Oral   Take 1 tablet by mouth every 6 (six) hours as needed for pain.   20 tablet   0   . ibuprofen (ADVIL,MOTRIN) 200 MG tablet   Oral   Take 1,000 mg by mouth every 6 (six) hours as needed for pain.         Marland Kitchen lidocaine (LIDODERM) 5 %   Transdermal   Place 1 patch onto the skin daily. Remove & Discard patch within 12 hours or as directed by MD   5 patch   0   . lisinopril (PRINIVIL,ZESTRIL) 5 MG tablet   Oral   Take 5 mg by mouth daily.         . naproxen (NAPROSYN) 500 MG tablet   Oral   Take 1 tablet (500 mg total) by mouth 2 (two) times daily.   30 tablet   0   . PRESCRIPTION MEDICATION   Apply externally   Apply 1 application topically 2 (two) times daily as needed (dry skin). triamcinolone 1%  Menthol and camfer 1%         . QUEtiapine (SEROQUEL) 200 MG tablet   Oral   Take 200 mg by mouth at bedtime.         . tamsulosin (FLOMAX) 0.4 MG  CAPS   Oral   Take 0.8 mg by mouth daily.         . urea (CARMOL) 40 % CREA   Topical   Apply 1 application topically daily.         Marland Kitchen zolpidem (AMBIEN) 5 MG tablet   Oral   Take 1 tablet (5 mg total) by mouth at bedtime as needed for sleep.   5 tablet   0     Allergies Saw palmetto and Sulfa antibiotics  History reviewed. No pertinent family history.  Social History Social History  Substance Use Topics  . Smoking status: Never Smoker   . Smokeless tobacco: None  . Alcohol Use: No    Review of Systems Constitutional: No fever/chills Eyes: No visual changes. ENT: No sore throat. Cardiovascular: Denies chest pain. Respiratory: Denies shortness of breath. Gastrointestinal: + abdominal pain.  No nausea, no vomiting.  No diarrhea.  + constipation. Genitourinary: Negative for dysuria. Musculoskeletal: Negative for back pain. Skin: Negative for rash. Neurological: Negative for headaches, focal weakness or numbness.  10-point ROS otherwise negative.  ____________________________________________   PHYSICAL EXAM:  VITAL SIGNS: ED Triage Vitals  Enc Vitals Group     BP 10/01/15 2202 136/76 mmHg     Pulse Rate 10/01/15 2202 88     Resp 10/01/15 2202 18     Temp 10/01/15 2202 98.6 F (37 C)     Temp src --      SpO2 10/01/15 2202 99 %     Weight 10/01/15 2202 190 lb (86.183 kg)     Height 10/01/15 2202 5\' 8"  (1.727 m)     Head Cir --      Peak Flow --      Pain Score 10/01/15 2203 9     Pain Loc --      Pain Edu? --      Excl. in [place]? --     Constitutional: Alert and oriented. Well appearing and in no acute distress. Eyes: Conjunctivae  are normal. PERRL. EOMI. Head: Atraumatic. Nose: No congestion/rhinnorhea. Mouth/Throat: Mucous membranes are moist.  Oropharynx non-erythematous. Neck: No stridor.   Cardiovascular: Normal rate, regular rhythm. Grossly normal heart sounds.  Good peripheral circulation. Respiratory: Normal respiratory effort.  No  retractions. Lungs CTAB. Gastrointestinal: Soft and nontender. No distention.  No CVA tenderness. Genitourinary: deferred Musculoskeletal: No lower extremity tenderness nor edema.  No joint effusions. Neurologic:  Normal speech and language. No gross focal neurologic deficits are appreciated. No gait instability. Skin:  Skin is warm, dry and intact. No rash noted. Psychiatric: Mood and affect are normal. Speech and behavior are normal.  ____________________________________________   LABS (all labs ordered are listed, but only abnormal results are displayed)  Labs Reviewed  BASIC METABOLIC PANEL - Abnormal; Notable for the following:    Glucose, Bld 128 (*)    All other components within normal limits  CBC WITH DIFFERENTIAL/PLATELET  HEPATIC FUNCTION PANEL  LIPASE, BLOOD   ____________________________________________  EKG  none ____________________________________________  RADIOLOGY  KUB IMPRESSION: Unremarkable bowel gas pattern; no free intra-abdominal air seen. No radiographic evidence for constipation. Trace air and fluid noted within the colon. No significant stool seen.  ____________________________________________   PROCEDURES  Procedure(s) performed: None  Critical Care performed: No  ____________________________________________   INITIAL IMPRESSION / ASSESSMENT AND PLAN / ED COURSE  Pertinent labs & imaging results that were available during my care of the patient were reviewed by me and considered in my medical decision making (see chart for details).  Dakota Harper is a 69 y.o. male with hypertension, hyperlipidemia, asthma since for evaluation of 2 months gradual onset diffuse abdominal pain, constant since onset, no modifying factors. On exam, he is generally well-appearing and in no acute distress. Vital signs stable, he is afebrile. He does not appear to be in any discomfort. He is a benign abdominal examination. Labs reviewed. Unremarkable CBC,  CMP, lipase. No dysuria or increased urinary frequency so will not obtain urinalysis as my suspicion for urinary tract infection is low. Upright KUB shows an unremarkable bowel gas pattern and no constipation. I discussed with him that I do not think that there is an acute life-threatening intra-abdominal process given the chronicity of his pain, his benign exam, normal vital signs and reassuring lab work. We discussed over-the-counter treatment with appropriately dosed Tylenol for his pain and need for continued GI follow-up as well as close PCP follow-up. He is requesting Ambien for help with sleep as he has discontinued his Seroquel and his pain causes difficulty with sleep. I discussed with him that I will give him 5 tablets given that is the weekend and he'll follow up with primary care doctor for continued treatment. He had a CT scan one month ago and given his benign exam, I do not the key requires a repeat at this time. Discussed return precautions, need for close follow-up and he is comfortable with the discharge plan. ____________________________________________   FINAL CLINICAL IMPRESSION(S) / ED DIAGNOSES  Final diagnoses:  Generalized abdominal pain      Joanne Gavel, MD 10/02/15 0139

## 2015-10-01 NOTE — ED Notes (Signed)
Patient transported to X-ray 

## 2015-10-01 NOTE — ED Notes (Signed)
Pt. 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  Pt. States last bowel movement was today, using a fleet enema.  Pt. States using a variety of constipation medication, prescribed and OTC.  Pt. States he will still have lower abdominal pain after having bowel movement.

## 2015-10-02 LAB — HEPATIC FUNCTION PANEL
ALBUMIN: 4.3 g/dL (ref 3.5–5.0)
ALT: 26 U/L (ref 17–63)
AST: 23 U/L (ref 15–41)
Alkaline Phosphatase: 83 U/L (ref 38–126)
BILIRUBIN INDIRECT: 0.4 mg/dL (ref 0.3–0.9)
Bilirubin, Direct: 0.1 mg/dL (ref 0.1–0.5)
TOTAL PROTEIN: 7.1 g/dL (ref 6.5–8.1)
Total Bilirubin: 0.5 mg/dL (ref 0.3–1.2)

## 2015-10-02 LAB — LIPASE, BLOOD: LIPASE: 40 U/L (ref 11–51)

## 2015-10-02 MED ORDER — ZOLPIDEM TARTRATE 5 MG PO TABS: 5.0000 mg | ORAL_TABLET | Freq: Every evening | ORAL | Status: DC | PRN

## 2015-10-03 ENCOUNTER — Emergency Department: Admission: EM | Admit: 2015-10-03 | Discharge: 2015-10-03 | Discharge status: Absent without leave | Payer: Medicare PPO

## 2015-10-07 DIAGNOSIS — R1084 Generalized abdominal pain: Secondary | ICD-10-CM | POA: Insufficient documentation

## 2015-10-17 DIAGNOSIS — Z87898 Personal history of other specified conditions: Secondary | ICD-10-CM | POA: Insufficient documentation

## 2015-10-18 DIAGNOSIS — R45851 Suicidal ideations: Secondary | ICD-10-CM | POA: Insufficient documentation

## 2015-10-18 DIAGNOSIS — Z7289 Other problems related to lifestyle: Secondary | ICD-10-CM | POA: Insufficient documentation

## 2015-10-18 DIAGNOSIS — G894 Chronic pain syndrome: Secondary | ICD-10-CM | POA: Insufficient documentation

## 2016-03-31 ENCOUNTER — Encounter: Payer: Self-pay | Admitting: Emergency Medicine

## 2016-03-31 DIAGNOSIS — R319 Hematuria, unspecified: Secondary | ICD-10-CM | POA: Insufficient documentation

## 2016-03-31 DIAGNOSIS — R350 Frequency of micturition: Secondary | ICD-10-CM | POA: Diagnosis present

## 2016-03-31 DIAGNOSIS — Z5321 Procedure and treatment not carried out due to patient leaving prior to being seen by health care provider: Secondary | ICD-10-CM | POA: Insufficient documentation

## 2016-03-31 LAB — BASIC METABOLIC PANEL
Anion gap: 8 (ref 5–15)
BUN: 23 mg/dL — AB (ref 6–20)
CHLORIDE: 107 mmol/L (ref 101–111)
CO2: 26 mmol/L (ref 22–32)
Calcium: 9.7 mg/dL (ref 8.9–10.3)
Creatinine, Ser: 1.53 mg/dL — ABNORMAL HIGH (ref 0.61–1.24)
GFR calc Af Amer: 51 mL/min — ABNORMAL LOW (ref >60)
GFR calc non Af Amer: 44 mL/min — ABNORMAL LOW (ref >60)
Glucose, Bld: 148 mg/dL — ABNORMAL HIGH (ref 65–99)
POTASSIUM: 3.8 mmol/L (ref 3.5–5.1)
SODIUM: 141 mmol/L (ref 135–145)

## 2016-03-31 LAB — CBC
HEMATOCRIT: 45.7 % (ref 40.0–52.0)
Hemoglobin: 15.3 g/dL (ref 13.0–18.0)
MCH: 30 pg (ref 26.0–34.0)
MCHC: 33.5 g/dL (ref 32.0–36.0)
MCV: 89.6 fL (ref 80.0–100.0)
Platelets: 281 10*3/uL (ref 150–440)
RBC: 5.1 MIL/uL (ref 4.40–5.90)
RDW: 13.4 % (ref 11.5–14.5)
WBC: 12.9 10*3/uL — AB (ref 3.8–10.6)

## 2016-03-31 NOTE — ED Notes (Signed)
Patient presents to the ED with urinary frequency, hematuria, and bladder "pressure".  Patient reports he is being treated for a UTI with levaquin and is on the 4th dose today.  Patient reports history of BPH and states, "I think I need to see a urologist."  Patient appears slightly uncomfortable at this time.

## 2016-04-01 ENCOUNTER — Emergency Department
Admission: EM | Admit: 2016-04-01 | Discharge: 2016-04-01 | Disposition: A | Discharge status: Home / Self Care | Payer: Medicare PPO | Attending: Emergency Medicine | Admitting: Emergency Medicine

## 2016-04-02 ENCOUNTER — Emergency Department: Payer: Medicare PPO

## 2016-04-02 ENCOUNTER — Emergency Department
Admission: EM | Admit: 2016-04-02 | Discharge: 2016-04-02 | Disposition: A | Discharge status: Home / Self Care | Payer: Medicare PPO | Attending: Emergency Medicine | Admitting: Emergency Medicine

## 2016-04-02 DIAGNOSIS — I10 Essential (primary) hypertension: Secondary | ICD-10-CM | POA: Diagnosis not present

## 2016-04-02 DIAGNOSIS — Z7951 Long term (current) use of inhaled steroids: Secondary | ICD-10-CM | POA: Diagnosis not present

## 2016-04-02 DIAGNOSIS — Z79899 Other long term (current) drug therapy: Secondary | ICD-10-CM | POA: Diagnosis not present

## 2016-04-02 DIAGNOSIS — Z7982 Long term (current) use of aspirin: Secondary | ICD-10-CM | POA: Insufficient documentation

## 2016-04-02 DIAGNOSIS — J45909 Unspecified asthma, uncomplicated: Secondary | ICD-10-CM | POA: Insufficient documentation

## 2016-04-02 DIAGNOSIS — N200 Calculus of kidney: Secondary | ICD-10-CM | POA: Insufficient documentation

## 2016-04-02 DIAGNOSIS — R319 Hematuria, unspecified: Secondary | ICD-10-CM

## 2016-04-02 DIAGNOSIS — R1032 Left lower quadrant pain: Secondary | ICD-10-CM | POA: Diagnosis present

## 2016-04-02 LAB — URINALYSIS COMPLETE WITH MICROSCOPIC (ARMC ONLY)
Bilirubin Urine: NEGATIVE
Glucose, UA: NEGATIVE mg/dL
KETONES UR: NEGATIVE mg/dL
Nitrite: NEGATIVE
PH: 5 (ref 5.0–8.0)
PROTEIN: 30 mg/dL — AB
Specific Gravity, Urine: 1.021 (ref 1.005–1.030)
Squamous Epithelial / LPF: NONE SEEN

## 2016-04-02 LAB — CBC WITH DIFFERENTIAL/PLATELET
Basophils Absolute: 0 10*3/uL (ref 0–0.1)
EOS ABS: 0.4 10*3/uL (ref 0–0.7)
Eosinophils Relative: 4 %
HEMATOCRIT: 44.5 % (ref 40.0–52.0)
Hemoglobin: 15.2 g/dL (ref 13.0–18.0)
Lymphocytes Relative: 16 %
Lymphs Abs: 1.5 10*3/uL (ref 1.0–3.6)
MCH: 30 pg (ref 26.0–34.0)
MCHC: 34.1 g/dL (ref 32.0–36.0)
MCV: 88.1 fL (ref 80.0–100.0)
MONO ABS: 1.1 10*3/uL — AB (ref 0.2–1.0)
NEUTROS ABS: 6.5 10*3/uL (ref 1.4–6.5)
Neutrophils Relative %: 69 %
Platelets: 257 10*3/uL (ref 150–440)
RBC: 5.06 MIL/uL (ref 4.40–5.90)
RDW: 13.6 % (ref 11.5–14.5)
WBC: 9.5 10*3/uL (ref 3.8–10.6)

## 2016-04-02 LAB — LACTIC ACID, PLASMA
LACTIC ACID, VENOUS: 1.1 mmol/L (ref 0.5–2.0)
Lactic Acid, Venous: 3.3 mmol/L (ref 0.5–2.0)

## 2016-04-02 LAB — LIPASE, BLOOD: Lipase: 32 U/L (ref 11–51)

## 2016-04-02 MED ORDER — CEPHALEXIN 500 MG PO CAPS: 500.0000 mg | ORAL_CAPSULE | Freq: Four times a day (QID) | ORAL | Status: AC

## 2016-04-02 MED ORDER — DEXTROSE 5 % IV SOLN
1.0000 g | Freq: Once | INTRAVENOUS | Status: AC
  Administered 2016-04-02: 1 g via INTRAVENOUS
  Filled 2016-04-02: qty 10

## 2016-04-02 MED ORDER — ONDANSETRON HCL 4 MG PO TABS: 4.0000 mg | ORAL_TABLET | Freq: Every day | ORAL | Status: DC | PRN

## 2016-04-02 MED ORDER — TAMSULOSIN HCL 0.4 MG PO CAPS: 0.4000 mg | ORAL_CAPSULE | Freq: Every day | ORAL | Status: DC

## 2016-04-02 MED ORDER — SODIUM CHLORIDE 0.9 % IV BOLUS (SEPSIS)
1000.0000 mL | Freq: Once | INTRAVENOUS | Status: AC
  Administered 2016-04-02: 1000 mL via INTRAVENOUS

## 2016-04-02 MED ORDER — OXYCODONE-ACETAMINOPHEN 5-325 MG PO TABS: 1.0000 | ORAL_TABLET | Freq: Four times a day (QID) | ORAL | Status: DC | PRN

## 2016-04-02 NOTE — ED Notes (Signed)
Pt seen on the 26th for blood in urine and frequency and treated with UTI. States haematuria has stopped. Pt reports lower abd cramping since yesterday. Denies diarrhea, vomiting or fever.

## 2016-04-02 NOTE — ED Notes (Signed)
MD at bedside. 

## 2016-04-02 NOTE — ED Notes (Signed)
Pt reports lower abdomen pain X 2 days. PT states he stopped taking his Lyrica less than a week ago to save money and he "shouldn't have done that". Pt also reports blood in urine last time he was seen by MD.

## 2016-04-02 NOTE — ED Notes (Signed)
Report to Iris, RN  

## 2016-04-02 NOTE — Discharge Instructions (Signed)
If you have increased pain, vomiting, fever, or you feel worse in any way return to the emergency room. Otherwise, follow up closely with your urologist tomorrow or Tuesday when they open.

## 2016-04-02 NOTE — ED Provider Notes (Addendum)
[facility] Emergency Department Provider Note  ____________________________________________   I have reviewed the triage vital signs and the nursing notes.   HISTORY  Chief Complaint Abdominal Pain    HPI Dakota Harper is a 70 y.o. male who states that for the last 6 months he has had abdominal cramping off and on. He went to see his primary care doctor, they thought it was because of constipation secondary to narcotic use. He then went to see a GI doctor he had an endoscopy and they started him on Lyrica. He states the Lyrica is controlled as cramping for the last few months but he elected to stop taking it a couple weeks ago and then began having cramping off and on for last few days. Patient was being treated for urinary tract infection. He has no dysuria no urinary frequency at this time. He thinks his urinary tract infection is improved. He has not had any vomiting. He does feel constipated and he feels this is perhaps why he is having his cramping. The cramping is in the left lower part of his abdomen. It is the same cramping that he had before. He has had no vomiting he does not feel nauseated. He denies any chest pain or shortness of breath. His pain is in the left lower quadrant. He had a few episodes of yesterday and some today. This is the same discomfort he has when he is constipated. He states that he does not feel that he has urinary retention he is not having any further urinary symptoms. Patient has a somewhat rambling and vague history but he is aware of his workup thus far. He denies any symptoms at this time.    Past Medical History  Diagnosis Date  . Hypertension   . Hypercholesteremia   . Asthma   . Sleep disorder   . BPH (benign prostatic hyperplasia)   . Ear pain   . Neuropathy ([place])     There are no active problems to display for this patient.   Past Surgical History  Procedure Laterality Date  . Hernia repair    . Cholecystectomy       Current Outpatient Rx  Name  Route  Sig  Dispense  Refill  . acetaminophen (TYLENOL) 325 MG tablet   Oral   Take 650 mg by mouth every 6 (six) hours as needed for pain.         Marland Kitchen albuterol (PROVENTIL HFA;VENTOLIN HFA) 108 (90 BASE) MCG/ACT inhaler   Inhalation   Inhale 1 puff into the lungs every 6 (six) hours as needed for wheezing.         . Artificial Tear Ointment (DRY EYES OP)   Both Eyes   Place 1 drop into both eyes as needed (dry eyes).         Marland Kitchen aspirin EC 81 MG tablet   Oral   Take 81 mg by mouth daily.         Marland Kitchen atorvastatin (LIPITOR) 80 MG tablet   Oral   Take 80 mg by mouth daily.         . cetirizine (ZYRTEC) 10 MG tablet   Oral   Take 10 mg by mouth daily.         . clobetasol cream (TEMOVATE) 0.05 %   Topical   Apply 1 application topically 2 (two) times daily.         . cyclobenzaprine (FLEXERIL) 10 MG tablet   Oral   Take  1 tablet (10 mg total) by mouth 2 (two) times daily as needed for muscle spasms.   20 tablet   0   . dicyclomine (BENTYL) 20 MG tablet   Oral   Take 1 tablet (20 mg total) by mouth 3 (three) times daily as needed for spasms.   12 tablet   0   . DULoxetine (CYMBALTA) 60 MG capsule   Oral   Take 60 mg by mouth daily.         . Fluticasone Propionate  HFA (FLOVENT HFA IN)   Inhalation   Inhale 2 sprays into the lungs 2 (two) times daily.         Marland Kitchen gabapentin (NEURONTIN) 800 MG tablet   Oral   Take 1,600 mg by mouth 3 (three) times daily.         Marland Kitchen HYDROcodone-acetaminophen (NORCO) 5-325 MG per tablet   Oral   Take 1 tablet by mouth every 6 (six) hours as needed for pain.   20 tablet   0   . ibuprofen (ADVIL,MOTRIN) 200 MG tablet   Oral   Take 1,000 mg by mouth every 6 (six) hours as needed for pain.         Marland Kitchen lidocaine (LIDODERM) 5 %   Transdermal   Place 1 patch onto the skin daily. Remove & Discard patch within 12 hours or as directed by MD   5 patch   0   . lisinopril  (PRINIVIL,ZESTRIL) 5 MG tablet   Oral   Take 5 mg by mouth daily.         . naproxen (NAPROSYN) 500 MG tablet   Oral   Take 1 tablet (500 mg total) by mouth 2 (two) times daily.   30 tablet   0   . PRESCRIPTION MEDICATION   Apply externally   Apply 1 application topically 2 (two) times daily as needed (dry skin). triamcinolone 1%  Menthol and camfer 1%         . QUEtiapine (SEROQUEL) 200 MG tablet   Oral   Take 200 mg by mouth at bedtime.         . tamsulosin (FLOMAX) 0.4 MG CAPS   Oral   Take 0.8 mg by mouth daily.         . urea (CARMOL) 40 % CREA   Topical   Apply 1 application topically daily.         Marland Kitchen zolpidem (AMBIEN) 5 MG tablet   Oral   Take 1 tablet (5 mg total) by mouth at bedtime as needed for sleep.   5 tablet   0     Allergies Saw palmetto and Sulfa antibiotics  No family history on file.  Social History Social History  Substance Use Topics  . Smoking status: Never Smoker   . Smokeless tobacco: Not on file  . Alcohol Use: No    Review of Systems Constitutional: No fever/chills Eyes: No visual changes. ENT: No sore throat. No stiff neck no neck pain Cardiovascular: Denies chest pain. Respiratory: Denies shortness of breath. Gastrointestinal:   no vomiting.  No diarrhea.  No constipation. Genitourinary: Negative for dysuria. Musculoskeletal: Negative lower extremity swelling Skin: Negative for rash. Neurological: Negative for headaches, focal weakness or numbness. 10-point ROS otherwise negative.  ____________________________________________   PHYSICAL EXAM:  VITAL SIGNS: ED Triage Vitals  Enc Vitals Group     BP 04/02/16 1437 135/82 mmHg     Pulse Rate 04/02/16 1437 92  Resp 04/02/16 1437 18     Temp 04/02/16 1437 98.5 F (36.9 C)     Temp Source 04/02/16 1437 Oral     SpO2 04/02/16 1437 98 %     Weight 04/02/16 1437 205 lb (92.987 kg)     Height 04/02/16 1437 5\' 8"  (1.727 m)     Head Cir --      Peak Flow --       Pain Score 04/02/16 1449 6     Pain Loc --      Pain Edu? --      Excl. in [place]? --     Constitutional: Alert and oriented. Well appearing and in no acute distress. Eyes: Conjunctivae are normal. PERRL. EOMI. Head: Atraumatic. Nose: No congestion/rhinnorhea. Mouth/Throat: Mucous membranes are moist.  Oropharynx non-erythematous. Neck: No stridor.   Nontender with no meningismus Cardiovascular: Normal rate, regular rhythm. Grossly normal heart sounds.  Good peripheral circulation. Respiratory: Normal respiratory effort.  No retractions. Lungs CTAB. Abdominal: Soft and nontender. No distention. No guarding no rebound Back:  There is no focal tenderness or step off there is no midline tenderness there are no lesions noted. there is no CVA tenderness  Musculoskeletal: No lower extremity tenderness. No joint effusions, no DVT signs strong distal pulses no edema Neurologic:  Normal speech and language. No gross focal neurologic deficits are appreciated.  Skin:  Skin is warm, dry and intact. No rash noted. Psychiatric: Mood and affect are normal. Speech and behavior are normal.  ____________________________________________   LABS (all labs ordered are listed, but only abnormal results are displayed)  Labs Reviewed  LACTIC ACID, PLASMA  LACTIC ACID, PLASMA  CBC WITH DIFFERENTIAL/PLATELET  LIPASE, BLOOD  URINALYSIS COMPLETEWITH MICROSCOPIC ([facility] ONLY)   ____________________________________________  EKG  I personally interpreted any EKGs ordered by me or triage  ____________________________________________  RADIOLOGY  I reviewed any imaging ordered by me or triage that were performed during my shift and, if possible, patient and/or family made aware of any abnormal findings. ____________________________________________   PROCEDURES  Procedure(s) performed: None  Critical Care performed: None  ____________________________________________   INITIAL IMPRESSION / ASSESSMENT  AND PLAN / ED COURSE  Pertinent labs & imaging results that were available during my care of the patient were reviewed by me and considered in my medical decision making (see chart for details).  Patient with a completely benign abdominal exam who has had a cramping on and off for months that was apparently managed well with Lyrica but that he stop taking the Lyrica. We'll do a bladder scan to make sure he is not retaining urine although low suspicion based on his exam, we'll check basic blood work, nothing at this time this excess that this left lower quadrant cramping pain in conjunction with the constipation, patient states he has not had a bowel movement in 3 days, reflects ischemic gut we will send a lactate. Nothing that makes me think this represents intrathoracic pathology such as ACS PE or dissection, we will get a screening x-ray trying to avoid external radiation for this a symptomatically as an check blood work and reassess.   ----------------------------------------- 4:44 PM on 04/02/2016 -----------------------------------------  Patient is passing a kidney stone which accounts for symptoms. I was not sure what is lactated indicates. There is also no indication of ischemic gut on CT white count is normal abdomen is completely benign and he has clear other inciting cause. Does not appear to be septic with normal vital signs afebrile normotensive and  at this time not tachycardic. We will give him IV fluid. Lactic acid is a very nonspecific finding. He has trace leuks but a large number reds and trace whites in his urine. We will send a urine culture. Certainly no evidence of urosepsis.  ----------------------------------------- 6:02 PM on 04/02/2016 -----------------------------------------  Remains a symptomatically no complaints, he is asking about discharge however I didn't want a recheck that lactic acid to make sure it was real. She has no complaints of pain or tenderness and his  abdomen is completely benign.  ----------------------------------------- 7:08 PM on 04/02/2016 -----------------------------------------  She remains in no discomfort. We will give him pain medication and Flomax, given there is a question that he may have recently had a urinary tract infection given Rocephin here although there is no evidence of infection is lactic acid and recheck his normal is unclear what that signified, certainly no evidence of ongoing ischemia or sepsis. Nonetheless with her on the side of safety give him IV antibiotics pending culture. We'll also start him on Keflex. Patient in no acute distress requesting discharge.. Return precautions and follow-up given and understood. ____________________________________________   FINAL CLINICAL IMPRESSION(S) / ED DIAGNOSES  Final diagnoses:  None      This chart was dictated using voice recognition software.  Despite best efforts to proofread,  errors can occur which can change meaning.     Schuyler Amor, MD 04/02/16 [place], MD 04/02/16 [place], MD 04/02/16 [place], MD 04/02/16 [place], MD 04/02/16 1909  Schuyler Amor, MD 04/02/16 [id number]

## 2016-04-02 NOTE — ED Notes (Signed)
Pt up to the bathroom

## 2016-04-02 NOTE — ED Notes (Signed)
Pt discharged to home.  Family member driving.  Discharge instructions reviewed.  Verbalized understanding.  No questions or concerns at this time.  Teach back verified.  Pt in NAD.  No items left in ED.   

## 2016-04-03 LAB — URINE CULTURE: Culture: NO GROWTH

## 2016-06-01 ENCOUNTER — Ambulatory Visit: Payer: Medicare PPO | Admitting: Psychiatry

## 2017-07-30 ENCOUNTER — Ambulatory Visit: Payer: Self-pay | Admitting: Podiatry

## 2017-08-02 ENCOUNTER — Encounter: Payer: Self-pay | Admitting: Podiatry

## 2017-08-02 ENCOUNTER — Ambulatory Visit (INDEPENDENT_AMBULATORY_CARE_PROVIDER_SITE_OTHER): Payer: Medicare PPO | Admitting: Podiatry

## 2017-08-02 VITALS — BP 153/89 | HR 85

## 2017-08-02 DIAGNOSIS — L03031 Cellulitis of right toe: Secondary | ICD-10-CM | POA: Diagnosis not present

## 2017-08-02 DIAGNOSIS — L600 Ingrowing nail: Secondary | ICD-10-CM

## 2017-08-02 NOTE — Progress Notes (Signed)
   Subjective:    Patient ID: Dakota Harper, male    DOB: [birth year], 71 y.o.   MRN: [id number]  HPI this patient presents the office with chief complaint of painful ingrowing toenails, both borders of the right great toe.  He says pain has been present for over 2 months walking and wearing his shoes.  He says the nails are trimmed at his facility, but the podiatrist hasn't showed up for months.  He says he had surgery to correct ingrown toenail on the left great toe which has done well.  He presents the office today for an evaluation and treatment for the ingrowing toenails on both borders right great toe.    Review of Systems  All other systems reviewed and are negative.      Objective:   Physical Exam General Appearance  Alert, conversant and in no acute stress.  Vascular  Dorsalis pedis and posterior pulses are palpable  bilaterally.  Capillary return is within normal limits  Bilaterally. Temperature is within normal limits  Bilaterally  Neurologic  Senn-Weinstein monofilament wire test within normal limits  bilaterally. Muscle power  Within normal limits bilaterally.  Nails marked incurvation noted along the medial border of the right great toe with mild incurvation noted lateral border right great toe.  Both borders reveal redness and swelling noted along the nail grooves.  No evidence of granulation tissue or pus  Orthopedic  No limitations of motion of motion feet bilaterally.  No crepitus or effusions noted.  No bony pathology or digital deformities noted.  Skin  normotropic skin with no porokeratosis noted bilaterally.  No signs of infections or ulcers noted.          Assessment & Plan:  Ingrown Toenail right hallux  Paronychia right hallux.   IE  Nail surgery.  Treatment options and alternatives discussed.  Recommended permanent phenol matrixectomy and patient agreed.  The right hallux  was prepped with alcohol and a toe block of 3cc of 2% lidocaine plain was  administered in a digital toe block. .  The toe was then prepped with betadine solution .  The offending nail borders were  then excised and matrix tissue exposed.  Phenol was then applied to the matrix tissue followed by an alcohol wash.  Antibiotic ointment and a dry sterile dressing was applied.  The patient was dispensed instructions for aftercare.  RTC 1 week.     Gardiner Barefoot DPM

## 2017-08-09 ENCOUNTER — Encounter: Payer: Self-pay | Admitting: Podiatry

## 2017-08-09 ENCOUNTER — Ambulatory Visit (INDEPENDENT_AMBULATORY_CARE_PROVIDER_SITE_OTHER): Payer: Medicare PPO | Admitting: Podiatry

## 2017-08-09 DIAGNOSIS — Z09 Encounter for follow-up examination after completed treatment for conditions other than malignant neoplasm: Secondary | ICD-10-CM

## 2017-08-09 NOTE — Progress Notes (Signed)
This patient returns to the office following nail surgery one week ago.  The patient says toe has been soaked and bandaged as directed.  There has been improvement of the toe since the surgery has been performed. The patient presents for continued evaluation and treatment. There is necrotic tissue along both nail grooves.  GENERAL APPEARANCE: Alert, conversant. Appropriately groomed. No acute distress.  VASCULAR: Pedal pulses palpable at  [facility] and PT bilateral.  Capillary refill time is immediate to all digits,  Normal temperature gradient.    NEUROLOGIC: sensation is normal to 5.07 monofilament at 5/5 sites bilateral.  Light touch is intact bilateral, Muscle strength normal.  MUSCULOSKELETAL: acceptable muscle strength, tone and stability bilateral.  Intrinsic muscluature intact bilateral.  Rectus appearance of foot and digits noted bilateral.   DERMATOLOGIC: skin color, texture, and turgor are within normal limits.  No preulcerative lesions or ulcers  are seen, no interdigital maceration noted.   NAILS  There is necrotic tissue along the nail groove  There is redness along both borders.  Mild pain.  DX  S/p nail surgery  ROV  Home instructions were discussed.  Patient to call the office if there are any questions or concerns. Prescribe cephalexin 500 mg  # 15.  RTC prn   Gardiner Barefoot DPM

## 2017-08-28 ENCOUNTER — Ambulatory Visit: Payer: Self-pay | Admitting: Urology

## 2017-09-17 ENCOUNTER — Encounter: Payer: Self-pay | Admitting: Urology

## 2017-09-17 ENCOUNTER — Ambulatory Visit (INDEPENDENT_AMBULATORY_CARE_PROVIDER_SITE_OTHER): Payer: 59 | Admitting: Urology

## 2017-09-17 VITALS — BP 145/91 | HR 112 | Ht 68.0 in | Wt 210.3 lb

## 2017-09-17 DIAGNOSIS — N3946 Mixed incontinence: Secondary | ICD-10-CM | POA: Diagnosis not present

## 2017-09-17 DIAGNOSIS — N400 Enlarged prostate without lower urinary tract symptoms: Secondary | ICD-10-CM | POA: Diagnosis not present

## 2017-09-17 LAB — URINALYSIS, COMPLETE
Bilirubin, UA: NEGATIVE
Glucose, UA: NEGATIVE
Leukocytes, UA: NEGATIVE
NITRITE UA: NEGATIVE
RBC, UA: NEGATIVE
SPEC GRAV UA: 1.025 (ref 1.005–1.030)
UUROB: 1 mg/dL (ref 0.2–1.0)
pH, UA: 6 (ref 5.0–7.5)

## 2017-09-17 LAB — MICROSCOPIC EXAMINATION
Epithelial Cells (non renal): NONE SEEN /hpf (ref 0–10)
RBC MICROSCOPIC, UA: NONE SEEN /HPF (ref 0–?)
WBC UA: NONE SEEN /HPF (ref 0–?)

## 2017-09-17 MED ORDER — MIRABEGRON ER 50 MG PO TB24: 50.0000 mg | ORAL_TABLET | Freq: Every day | ORAL | 11 refills | Status: DC

## 2017-09-17 NOTE — Progress Notes (Signed)
09/17/2017 9:18 AM   Dakota Harper 09/21/1946 [id number]  Referring provider: Janann [address] [place], [place] [address]  Chief Complaint  Patient presents with  . Benign Prostatic Hypertrophy    HPI: The patient was consulted to evaluate his prostate.  He describes an enlarged prostate on a recent CT scan.  He wanted a prostate examination.  He is on Avodart and Flomax and wondered after 6 months could he stop the Avodart since he did not think it was working well.  He has a significant sleeping disorder and voids every 1-2 hours at night and about the same during the day.  His flow is reasonable  He did not note any other neurologic issues but I noticed a left hand tremor.  He has had at least one kidney stone passed  He has not had urinary tract infections and denies previous GU surgery  Modifying factors: There are no other modifying factors  Associated signs and symptoms: There are no other associated signs and symptoms Aggravating and relieving factors: There are no other aggravating or relieving factors Severity: Moderate Duration: Persistent     PMH: Past Medical History:  Diagnosis Date  . Asthma   . BPH (benign prostatic hyperplasia)   . Ear pain   . Hypercholesteremia   . Hypertension   . Neuropathy   . Sleep disorder     Surgical History: Past Surgical History:  Procedure Laterality Date  . CHOLECYSTECTOMY    . HERNIA REPAIR      Home Medications:  Allergies as of 09/17/2017      Reactions   Saw Palmetto Rash, Hives   "Berry form"   Other    Sulfa Antibiotics Rash   Sulfacetamide Sodium Rash      Medication List        Accurate as of 09/17/17  9:18 AM. Always use your most recent med list.          acetaminophen 325 MG tablet Commonly known as:  TYLENOL Take 650 mg by mouth every 6 (six) hours as needed for pain.   albuterol 108 (90 Base) MCG/ACT inhaler Commonly known as:  PROVENTIL HFA;VENTOLIN  HFA Inhale 1 puff into the lungs every 6 (six) hours as needed for wheezing.   amLODipine 5 MG tablet Commonly known as:  NORVASC   aspirin EC 81 MG tablet Take 81 mg by mouth daily.   atorvastatin 80 MG tablet Commonly known as:  LIPITOR Take 80 mg by mouth daily.   clobetasol cream 0.05 % Commonly known as:  TEMOVATE Apply 1 application topically 2 (two) times daily.   clonazePAM 1 MG tablet Commonly known as:  KLONOPIN Take 1 mg 2 (two) times daily by mouth.   DULoxetine 60 MG capsule Commonly known as:  CYMBALTA Take 60 mg by mouth daily.   dutasteride 0.5 MG capsule Commonly known as:  AVODART   FLOVENT HFA IN Inhale 2 sprays into the lungs 2 (two) times daily.   guaiFENesin 600 MG 12 hr tablet Commonly known as:  MUCINEX Take 2 (two) times daily by mouth.   ibuprofen 200 MG tablet Commonly known as:  ADVIL,MOTRIN Take 1,000 mg by mouth every 6 (six) hours as needed for pain.   levothyroxine 50 MCG tablet Commonly known as:  SYNTHROID, LEVOTHROID Take 50 mcg daily before breakfast by mouth.   lisinopril 5 MG tablet Commonly known as:  PRINIVIL,ZESTRIL Take 5 mg by mouth daily.   loratadine 10 MG tablet  Commonly known as:  CLARITIN Take 10 mg daily by mouth.   mirabegron ER 50 MG Tb24 tablet Commonly known as:  MYRBETRIQ Take 1 tablet (50 mg total) daily by mouth.   naproxen 500 MG tablet Commonly known as:  NAPROSYN Take 1 tablet (500 mg total) by mouth 2 (two) times daily.   PRESCRIPTION MEDICATION Apply 1 application topically 2 (two) times daily as needed (dry skin). triamcinolone 1%  Menthol and camfer 1%   QUEtiapine 200 MG tablet Commonly known as:  SEROQUEL Take 200 mg by mouth at bedtime.   risperiDONE 1 MG tablet Commonly known as:  RISPERDAL Take 1 mg at bedtime by mouth.   tamsulosin 0.4 MG Caps capsule Commonly known as:  FLOMAX Take 1 capsule (0.4 mg total) by mouth daily.   traZODone 50 MG tablet Commonly known as:   DESYREL Take 50 mg at bedtime by mouth.   urea 40 % Crea Commonly known as:  CARMOL Apply 1 application topically daily.   zolpidem 5 MG tablet Commonly known as:  AMBIEN Take 1 tablet (5 mg total) by mouth at bedtime as needed for sleep.       Allergies:  Allergies  Allergen Reactions  . Saw Palmetto Rash and Hives    "Berry form"  . Other   . Sulfa Antibiotics Rash  . Sulfacetamide Sodium Rash    Family History: Family History  Problem Relation Age of Onset  . Prostate cancer Neg Hx   . Bladder Cancer Neg Hx   . Kidney cancer Neg Hx     Social History:  reports that  has never smoked. he has never used smokeless tobacco. He reports that he does not drink alcohol or use drugs.  ROS: UROLOGY Frequent Urination?: Yes Hard to postpone urination?: No Burning/pain with urination?: Yes Get up at night to urinate?: Yes Leakage of urine?: No Urine stream starts and stops?: No Trouble starting stream?: No Do you have to strain to urinate?: No Blood in urine?: No Urinary tract infection?: No Sexually transmitted disease?: No Injury to kidneys or bladder?: No Painful intercourse?: No Weak stream?: No Erection problems?: No Penile pain?: No  Gastrointestinal Nausea?: No Vomiting?: No Indigestion/heartburn?: No Diarrhea?: No Constipation?: No  Constitutional Fever: No Night sweats?: No Weight loss?: No Fatigue?: Yes  Skin Skin rash/lesions?: No Itching?: No  Eyes Blurred vision?: No Double vision?: No  Ears/Nose/Throat Sore throat?: No Sinus problems?: Yes  Hematologic/Lymphatic Swollen glands?: No Easy bruising?: No  Cardiovascular Leg swelling?: No Chest pain?: Yes  Respiratory Cough?: Yes Shortness of breath?: No  Endocrine Excessive thirst?: No  Musculoskeletal Back pain?: No Joint pain?: Yes  Neurological Headaches?: No Dizziness?: No  Psychologic Depression?: Yes Anxiety?: No  Physical Exam: BP (!) 145/91 (BP  Location: Right Arm, Patient Position: Sitting, Cuff Size: Normal)   Pulse (!) 112   Ht 5\' 8"  (1.727 m)   Wt 210 lb 4.8 oz (95.4 kg)   BMI 31.98 kg/m   Constitutional:  Alert and oriented, No acute distress. HEENT: [place] AT, moist mucus membranes.  Trachea midline, no masses. Cardiovascular: No clubbing, cyanosis, or edema. Respiratory: Normal respiratory effort, no increased work of breathing. GI: Abdomen is soft, nontender, nondistended, no abdominal masses GU: 50 g benign prostate Skin: No rashes, bruises or suspicious lesions. Lymph: No cervical or inguinal adenopathy. Neurologic: Grossly intact, no focal deficits, moving all 4 extremities. Psychiatric: Normal mood and affect.  Laboratory Data: Lab Results  Component Value Date   WBC  9.5 04/02/2016   HGB 15.2 04/02/2016   HCT 44.5 04/02/2016   MCV 88.1 04/02/2016   PLT 257 04/02/2016    Lab Results  Component Value Date   CREATININE 1.53 (H) 03/31/2016    No results found for: PSA  No results found for: TESTOSTERONE  No results found for: HGBA1C  Urinalysis    Component Value Date/Time   COLORURINE YELLOW (A) 04/02/2016 1522   APPEARANCEUR CLEAR (A) 04/02/2016 1522   LABSPEC 1.021 04/02/2016 1522   PHURINE 5.0 04/02/2016 1522   GLUCOSEU NEGATIVE 04/02/2016 1522   HGBUR 2+ (A) 04/02/2016 1522   BILIRUBINUR NEGATIVE 04/02/2016 1522   KETONESUR NEGATIVE 04/02/2016 1522   PROTEINUR 30 (A) 04/02/2016 1522   NITRITE NEGATIVE 04/02/2016 1522   LEUKOCYTESUR TRACE (A) 04/02/2016 1522    Pertinent Imaging: none  Assessment & Plan: The patient has medical comorbidities with frequency and nocturia.  A CT scan in May 2017 demonstrated an enlarged prostate and he had a distal left ureteral stone.  The patient would like to try Myrbetriq 50 mg.  I will ask him next time if he stopped the Avodart.  I am not convinced that desmopressin is ideal for him.  I found that the conversation was a little bit circular today but I  think he understands things well.  He may have tried in antimuscarinic years ago without benefit.  Reevaluate in 4-6 weeks  1. Benign prostatic hyperplasia, unspecified whether lower urinary tract symptoms present 2.  Nighttime frequency - Urinalysis, Complete   No Follow-up on file.  Reece Packer, MD  [facility] [address] [place], [place] [address] [phone]

## 2017-10-15 ENCOUNTER — Ambulatory Visit: Payer: Self-pay

## 2017-11-12 ENCOUNTER — Ambulatory Visit: Payer: Self-pay | Admitting: Urology

## 2017-11-14 DIAGNOSIS — G4733 Obstructive sleep apnea (adult) (pediatric): Secondary | ICD-10-CM | POA: Insufficient documentation

## 2017-11-27 ENCOUNTER — Ambulatory Visit: Payer: 59 | Attending: Neurology

## 2017-11-27 DIAGNOSIS — G4761 Periodic limb movement disorder: Secondary | ICD-10-CM | POA: Insufficient documentation

## 2017-11-27 DIAGNOSIS — G4733 Obstructive sleep apnea (adult) (pediatric): Secondary | ICD-10-CM | POA: Diagnosis present

## 2017-11-27 DIAGNOSIS — Z8669 Personal history of other diseases of the nervous system and sense organs: Secondary | ICD-10-CM | POA: Insufficient documentation

## 2017-11-27 DIAGNOSIS — Z09 Encounter for follow-up examination after completed treatment for conditions other than malignant neoplasm: Secondary | ICD-10-CM | POA: Diagnosis not present

## 2018-01-31 ENCOUNTER — Emergency Department
Admission: EM | Admit: 2018-01-31 | Discharge: 2018-01-31 | Disposition: A | Discharge status: Home / Self Care | Payer: 59 | Attending: Emergency Medicine | Admitting: Emergency Medicine

## 2018-01-31 ENCOUNTER — Other Ambulatory Visit: Payer: Self-pay

## 2018-01-31 DIAGNOSIS — Z79899 Other long term (current) drug therapy: Secondary | ICD-10-CM | POA: Insufficient documentation

## 2018-01-31 DIAGNOSIS — J45909 Unspecified asthma, uncomplicated: Secondary | ICD-10-CM | POA: Insufficient documentation

## 2018-01-31 DIAGNOSIS — R339 Retention of urine, unspecified: Secondary | ICD-10-CM | POA: Diagnosis not present

## 2018-01-31 DIAGNOSIS — Z7982 Long term (current) use of aspirin: Secondary | ICD-10-CM | POA: Insufficient documentation

## 2018-01-31 DIAGNOSIS — K5900 Constipation, unspecified: Secondary | ICD-10-CM | POA: Diagnosis present

## 2018-01-31 DIAGNOSIS — I10 Essential (primary) hypertension: Secondary | ICD-10-CM | POA: Insufficient documentation

## 2018-01-31 DIAGNOSIS — K5641 Fecal impaction: Secondary | ICD-10-CM | POA: Diagnosis not present

## 2018-01-31 NOTE — ED Triage Notes (Addendum)
Pt "my rectum is impacted" states he has had issues with it in the past and was able to get some out himself but is still uncomfortable. Pt was brought and dropped off from assistive living at [place]. Pt is a/ox4.Marland Kitchen

## 2018-01-31 NOTE — ED Provider Notes (Signed)
[facility] Emergency Department Provider Note  ____________________________________________   First MD Initiated Contact with Patient 01/31/18 1511     (approximate)  I have reviewed the triage vital signs and the nursing notes.   HISTORY  Chief Complaint Constipation   HPI Dakota Harper is a 72 y.o. male with a history of hypertension as well as intermittent constipation who is presenting to the emergency department having not moved his bowels over the past 4-5 days.  He says that he try to self disimpact was able to get a very small amount out but still feels pressure in his rectum and is unable to urinate.  He says that his abdomen also feels full but is denying any abdominal pain to me.   Past Medical History:  Diagnosis Date  . Asthma   . BPH (benign prostatic hyperplasia)   . Ear pain   . Hypercholesteremia   . Hypertension   . Neuropathy   . Sleep disorder     There are no active problems to display for this patient.   Past Surgical History:  Procedure Laterality Date  . CHOLECYSTECTOMY    . HERNIA REPAIR      Prior to Admission medications   Medication Sig Start Date End Date Taking? Authorizing Provider  acetaminophen (TYLENOL) 325 MG tablet Take 650 mg by mouth every 6 (six) hours as needed for pain.    [provider]  albuterol (PROVENTIL HFA;VENTOLIN HFA) 108 (90 BASE) MCG/ACT inhaler Inhale 1 puff into the lungs every 6 (six) hours as needed for wheezing.    [provider]  amLODipine (NORVASC) 5 MG tablet  08/07/17   [provider]  aspirin EC 81 MG tablet Take 81 mg by mouth daily.    [provider]  atorvastatin (LIPITOR) 80 MG tablet Take 80 mg by mouth daily.    [provider]  clobetasol cream (TEMOVATE) 9.98 % Apply 1 application topically 2 (two) times daily.    [provider]  clonazePAM (KLONOPIN) 1 MG tablet Take 1 mg 2 (two) times daily by mouth.     [provider]  DULoxetine (CYMBALTA) 60 MG capsule Take 60 mg by mouth daily.    [provider]  dutasteride (AVODART) 0.5 MG capsule  07/05/17   [provider]  Fluticasone Propionate  HFA (FLOVENT HFA IN) Inhale 2 sprays into the lungs 2 (two) times daily.    [provider]  guaiFENesin (MUCINEX) 600 MG 12 hr tablet Take 2 (two) times daily by mouth.    [provider]  ibuprofen (ADVIL,MOTRIN) 200 MG tablet Take 1,000 mg by mouth every 6 (six) hours as needed for pain.    [provider]  levothyroxine (SYNTHROID, LEVOTHROID) 50 MCG tablet Take 50 mcg daily before breakfast by mouth.    [provider]  lisinopril (PRINIVIL,ZESTRIL) 5 MG tablet Take 5 mg by mouth daily.    [provider]  loratadine (CLARITIN) 10 MG tablet Take 10 mg daily by mouth.    [provider]  mirabegron ER (MYRBETRIQ) 50 MG TB24 tablet Take 1 tablet (50 mg total) daily by mouth. 09/17/17   MacDiarmid, Nicki Reaper, MD  naproxen (NAPROSYN) 500 MG tablet Take 1 tablet (500 mg total) by mouth 2 (two) times daily. Patient not taking: Reported on 08/02/2017 04/20/13   Jeannett Senior, PA-C  PRESCRIPTION MEDICATION Apply 1 application topically 2 (two) times daily as needed (dry skin). triamcinolone 1%  Menthol and camfer  1%    [provider]  QUEtiapine (SEROQUEL) 200 MG tablet Take 200 mg by mouth at bedtime.    [provider]  risperiDONE (RISPERDAL) 1 MG tablet Take 1 mg at bedtime by mouth.    [provider]  tamsulosin (FLOMAX) 0.4 MG CAPS capsule Take 1 capsule (0.4 mg total) by mouth daily. 04/02/16   Schuyler Amor, MD  traZODone (DESYREL) 50 MG tablet Take 50 mg at bedtime by mouth.    [provider]  urea (CARMOL) 40 % CREA Apply 1 application topically daily.    [provider]  zolpidem (AMBIEN) 5 MG tablet Take 1 tablet (5 mg total) by mouth at bedtime as needed for sleep. Patient  not taking: Reported on 08/02/2017 10/02/15   Joanne Gavel, MD    Allergies Saw palmetto; Other; Sulfa antibiotics; and Sulfacetamide sodium  Family History  Problem Relation Age of Onset  . Prostate cancer Neg Hx   . Bladder Cancer Neg Hx   . Kidney cancer Neg Hx     Social History Social History   Tobacco Use  . Smoking status: Never Smoker  . Smokeless tobacco: Never Used  Substance Use Topics  . Alcohol use: No  . Drug use: No    Review of Systems  Constitutional: No fever/chills Eyes: No visual changes. ENT: No sore throat. Cardiovascular: Denies chest pain. Respiratory: Denies shortness of breath. Gastrointestinal: No abdominal pain.  No nausea, no vomiting.  No diarrhea.   Genitourinary: Negative for dysuria. Musculoskeletal: Negative for back pain. Skin: Negative for rash. Neurological: Negative for headaches, focal weakness or numbness.   ____________________________________________   PHYSICAL EXAM:  VITAL SIGNS: ED Triage Vitals  Enc Vitals Group     BP 01/31/18 1419 108/66     Pulse Rate 01/31/18 1419 68     Resp 01/31/18 1419 18     Temp 01/31/18 1419 97.9 F (36.6 C)     Temp Source 01/31/18 1419 Oral     SpO2 01/31/18 1419 100 %     Weight 01/31/18 1419 205 lb (93 kg)     Height 01/31/18 1419 5\' 8"  (1.727 m)     Head Circumference --      Peak Flow --      Pain Score 01/31/18 1425 0     Pain Loc --      Pain Edu? --      Excl. in [place]? --     Constitutional: Alert and oriented. Well appearing and in no acute distress. Eyes: Conjunctivae are normal.  Head: Atraumatic. Nose: No congestion/rhinnorhea. Mouth/Throat: Mucous membranes are moist.  Neck: No stridor.   Cardiovascular: Normal rate, regular rhythm. Grossly normal heart sounds.   Respiratory: Normal respiratory effort.  No retractions. Lungs CTAB. Gastrointestinal: Soft and nontender. No distention.  Musculoskeletal: No lower extremity tenderness nor edema.  No joint  effusions. Neurologic:  Normal speech and language. No gross focal neurologic deficits are appreciated. Skin:  Skin is warm, dry and intact. No rash noted. Psychiatric: Mood and affect are normal. Speech and behavior are normal.  ____________________________________________   LABS (all labs ordered are listed, but only abnormal results are displayed)  Labs Reviewed - No data to display ____________________________________________  EKG   ____________________________________________  RADIOLOGY   ____________________________________________   PROCEDURES  Procedure(s) performed:    Fecal disimpaction Date/Time: 01/31/2018 5:01 PM Performed by: Orbie Pyo, MD Authorized by: Orbie Pyo, MD  Consent: Verbal consent obtained. Consent given  by: patient Patient understanding: patient states understanding of the procedure being performed Patient identity confirmed: verbally with patient Patient tolerance: Patient tolerated the procedure well with no immediate complications Comments: Several large, hard stool balls removed from the rectal vault     Critical Care performed:   ____________________________________________   INITIAL IMPRESSION / ASSESSMENT AND PLAN / ED COURSE  Pertinent labs & imaging results that were available during my care of the patient were reviewed by me and considered in my medical decision making (see chart for details).  DDX: Fecal impaction, constipation, bowel obstruction, urinary retention As part of my medical decision making, I reviewed the following data within the electronic MEDICAL RECORD NUMBER Notes from prior ED visits  ----------------------------------------- 5:03 PM on 01/31/2018 -----------------------------------------  Patient reports having a large bowel movement after disimpaction.  Says he feels improved and was also able to urinate.  Will be discharged at this  time. ____________________________________________   FINAL CLINICAL IMPRESSION(S) / ED DIAGNOSES  Fecal Impaction.     NEW MEDICATIONS STARTED DURING THIS VISIT:  New Prescriptions   No medications on file     Note:  This document was prepared using Dragon voice recognition software and may include unintentional dictation errors.     Orbie Pyo, MD 01/31/18 [phone]

## 2018-01-31 NOTE — ED Triage Notes (Addendum)
FIRST NURSE NOTE-pt here for constipation. Last BM yesterday. Reports took some stool out with hand. Ambulatory.

## 2018-04-07 NOTE — Progress Notes (Signed)
[facility] Consultation      Assessment and Plan:  Asthma with chronic cough. - Patient using rescue inhaler 3 times daily. - He is asked to stop lisinopril, he is currently only taking 5 mg with blood pressure well controlled.  Will need to have follow-up with PCP and his assisted living facility to see if his blood pressure needs to be treated with other medications. - He has symptoms of allergic rhinitis, he is asked to take Nasacort 2 sprays in each nostril every night. - He is asked to return in about 6 weeks if his symptoms have not improved.  At that time we can consider further titration of inhalers, as well as initiation of cough medicine such as Tessalon.   Date: 04/08/2018  MRN# [id number] Dakota Harper 1946-03-22    Dakota Harper is a 72 y.o. old male seen in consultation for chief complaint of:    Chief Complaint  Patient presents with  . Consult    Referral from Sierra Brooks, NP. Persistent cough vs allergic bronchitis.     HPI:   The patient is a 72 year old male with a history of headaches as well as a history of obstructive sleep apnea.  Patient comes in today from the [facility] which is assisted living.  He is here because he was diagnosed with allergic bronchitis, he coughs when the weather changes. He has been on both pulmicort and advair over the years, both seemed to work. When he moved to [facility] in 2005 he was changed to flovent and albuterol.  He notes that the past few years he has been having to use his albuterol up to 3 times per day in addition to the flovent over the past few years because of coughing. He is coughing up mucus. He is not a smoker. He has never lived with a smoker.  He has occasional sinus drainage, he takes claritin daily. He was told in the past that his allergy tests were negative. He does not take a nasal spray.  He denies reflux.   He apparently had a sleep study a few months ago which  was negative, at sleep med. He has insomnia for which he takes ambien XR. He has been on multiple medications for insomnia for several years including seroquel, restoril, temazepam.   Review and summary of outside medical records: Patient has a history of allergic bronchitis, currently on Claritin, he has been maintained on Flovent twice daily and albuterol as needed.  He has issues with insomnia, he takes Klonopin 1 mg nightly, his history includes hypertension, depression, schizoaffective disorder.   PMHX:   Past Medical History:  Diagnosis Date  . Asthma   . BPH (benign prostatic hyperplasia)   . Ear pain   . Hypercholesteremia   . Hypertension   . Neuropathy   . Sleep disorder    Surgical Hx:  Past Surgical History:  Procedure Laterality Date  . CHOLECYSTECTOMY    . HERNIA REPAIR     Family Hx:  Family History  Problem Relation Age of Onset  . Prostate cancer Neg Hx   . Bladder Cancer Neg Hx   . Kidney cancer Neg Hx    Social Hx:   Social History   Tobacco Use  . Smoking status: Never Smoker  . Smokeless tobacco: Never Used  Substance Use Topics  . Alcohol use: No  . Drug use: No   Medication:    Current Outpatient Medications:  .  acetaminophen (TYLENOL) 325 MG tablet, Take 650 mg by mouth every 6 (six) hours as needed for pain., Disp: , Rfl:  .  albuterol (PROVENTIL HFA;VENTOLIN HFA) 108 (90 BASE) MCG/ACT inhaler, Inhale 1 puff into the lungs every 6 (six) hours as needed for wheezing., Disp: , Rfl:  .  amLODipine (NORVASC) 5 MG tablet, , Disp: , Rfl:  .  aspirin EC 81 MG tablet, Take 81 mg by mouth daily., Disp: , Rfl:  .  atorvastatin (LIPITOR) 80 MG tablet, Take 80 mg by mouth daily., Disp: , Rfl:  .  clobetasol cream (TEMOVATE) 6.57 %, Apply 1 application topically 2 (two) times daily., Disp: , Rfl:  .  clonazePAM (KLONOPIN) 1 MG tablet, Take 1 mg 2 (two) times daily by mouth., Disp: , Rfl:  .  DULoxetine (CYMBALTA) 60 MG capsule, Take 60 mg by mouth  daily., Disp: , Rfl:  .  dutasteride (AVODART) 0.5 MG capsule, , Disp: , Rfl:  .  Fluticasone Propionate  HFA (FLOVENT HFA IN), Inhale 2 sprays into the lungs 2 (two) times daily., Disp: , Rfl:  .  guaiFENesin (MUCINEX) 600 MG 12 hr tablet, Take 2 (two) times daily by mouth., Disp: , Rfl:  .  ibuprofen (ADVIL,MOTRIN) 200 MG tablet, Take 1,000 mg by mouth every 6 (six) hours as needed for pain., Disp: , Rfl:  .  levothyroxine (SYNTHROID, LEVOTHROID) 50 MCG tablet, Take 50 mcg daily before breakfast by mouth., Disp: , Rfl:  .  lisinopril (PRINIVIL,ZESTRIL) 5 MG tablet, Take 5 mg by mouth daily., Disp: , Rfl:  .  loratadine (CLARITIN) 10 MG tablet, Take 10 mg daily by mouth., Disp: , Rfl:  .  mirabegron ER (MYRBETRIQ) 50 MG TB24 tablet, Take 1 tablet (50 mg total) daily by mouth., Disp: 30 tablet, Rfl: 11 .  naproxen (NAPROSYN) 500 MG tablet, Take 1 tablet (500 mg total) by mouth 2 (two) times daily. (Patient not taking: Reported on 08/02/2017), Disp: 30 tablet, Rfl: 0 .  PRESCRIPTION MEDICATION, Apply 1 application topically 2 (two) times daily as needed (dry skin). triamcinolone 1%  Menthol and camfer 1%, Disp: , Rfl:  .  QUEtiapine (SEROQUEL) 200 MG tablet, Take 200 mg by mouth at bedtime., Disp: , Rfl:  .  risperiDONE (RISPERDAL) 1 MG tablet, Take 1 mg at bedtime by mouth., Disp: , Rfl:  .  tamsulosin (FLOMAX) 0.4 MG CAPS capsule, Take 1 capsule (0.4 mg total) by mouth daily., Disp: 10 capsule, Rfl: 0 .  traZODone (DESYREL) 50 MG tablet, Take 50 mg at bedtime by mouth., Disp: , Rfl:  .  urea (CARMOL) 40 % CREA, Apply 1 application topically daily., Disp: , Rfl:  .  zolpidem (AMBIEN) 5 MG tablet, Take 1 tablet (5 mg total) by mouth at bedtime as needed for sleep. (Patient not taking: Reported on 08/02/2017), Disp: 5 tablet, Rfl: 0   Allergies:  Saw palmetto; Other; Sulfa antibiotics; and Sulfacetamide sodium  Review of Systems: Gen:  Denies  fever, sweats, chills HEENT: Denies blurred vision,  double vision. bleeds, sore throat Cvc:  No dizziness, chest pain. Resp:   Denies cough or sputum production, shortness of breath Gi: Denies swallowing difficulty, stomach pain. Gu:  Denies bladder incontinence, burning urine Ext:   No Joint pain, stiffness. Skin: No skin rash,  hives  Endoc:  No polyuria, polydipsia. Psych: No depression, insomnia. Other:  All other systems were reviewed with the patient and were negative other that what is mentioned in the HPI.  Physical Examination:   VS: BP 110/68 (BP Location: Right Arm, Cuff Size: Normal)   Pulse 95   SpO2 96%   General Appearance: No distress  Neuro:without focal findings,  speech normal,  HEENT: PERRLA, EOM intact.  Mallampati 4 Pulmonary: normal breath sounds, No wheezing.  CardiovascularNormal S1,S2.  No m/r/g.   Abdomen: Benign, Soft, non-tender. Renal:  No costovertebral tenderness  GU:  No performed at this time. Endoc: No evident thyromegaly, no signs of acromegaly. Skin:   warm, no rashes, no ecchymosis  Extremities: normal, no cyanosis, clubbing.  Other findings:    LABORATORY PANEL:   CBC No results for input(s): WBC, HGB, HCT, PLT in the last 168 hours. ------------------------------------------------------------------------------------------------------------------  Chemistries  No results for input(s): NA, K, CL, CO2, GLUCOSE, BUN, CREATININE, CALCIUM, MG, AST, ALT, ALKPHOS, BILITOT in the last 168 hours.  Invalid input(s): GFRCGP ------------------------------------------------------------------------------------------------------------------  Cardiac Enzymes No results for input(s): TROPONINI in the last 168 hours. ------------------------------------------------------------  RADIOLOGY:  No results found.     Thank  you for the consultation and for allowing [facility] to assist in the care of your patient. Our recommendations are noted above.  Please contact us if  we can be of further service.   Marda Stalker, MD.  Board Certified in Internal Medicine, Pulmonary Medicine, [place], and Sleep Medicine.  [facility] Office Number: [phone]  Patricia Pesa, M.D.  Merton Border, M.D  04/08/2018

## 2018-04-08 ENCOUNTER — Ambulatory Visit (INDEPENDENT_AMBULATORY_CARE_PROVIDER_SITE_OTHER): Payer: 59 | Admitting: Internal Medicine

## 2018-04-08 VITALS — BP 110/68 | HR 95

## 2018-04-08 DIAGNOSIS — R05 Cough: Secondary | ICD-10-CM

## 2018-04-08 DIAGNOSIS — J4550 Severe persistent asthma, uncomplicated: Secondary | ICD-10-CM

## 2018-04-08 DIAGNOSIS — T464X5A Adverse effect of angiotensin-converting-enzyme inhibitors, initial encounter: Secondary | ICD-10-CM

## 2018-04-08 DIAGNOSIS — R058 Other specified cough: Secondary | ICD-10-CM

## 2018-04-08 NOTE — Patient Instructions (Addendum)
  Start nasacort 2 sprays in each nostril every night.  Stop lisinopril  Come back in 6 weeks if symptoms have not resolved.

## 2018-05-08 NOTE — Progress Notes (Deleted)
[facility] Consultation      Assessment and Plan:  Asthma with chronic cough. - Patient using rescue inhaler 3 times daily. - He is asked to stop lisinopril, he is currently only taking 5 mg with blood pressure well controlled.  Will need to have follow-up with PCP and his assisted living facility to see if his blood pressure needs to be treated with other medications. - He has symptoms of allergic rhinitis, he is asked to take Nasacort 2 sprays in each nostril every night. - He is asked to return in about 6 weeks if his symptoms have not improved.  At that time we can consider further titration of inhalers, as well as initiation of cough medicine such as Tessalon.   Date: 05/08/2018  MRN# [id number] Dakota Harper 10-Mar-1946    Dakota Harper is a 72 y.o. old male seen in consultation for chief complaint of:    No chief complaint on file.   HPI:  The patient is a 72 year old male with a history of headaches as well as a history of obstructive sleep apnea he is a resident at [facility] which is assisted living.  At last visit it was noted that he had asthma with chronic cough.  He was asked to stop lisinopril, he was asked to use Nasacort, continue rescue inhaler, and follow-up in 6 weeks if symptoms have not improved, at that time we could consider further titration of inhalers as well as cough medicines.  He apparently had a sleep study a few months ago which was negative, at [facility]. He has insomnia for which he takes ambien XR. He has been on multiple medications for insomnia for several years including seroquel, restoril, temazepam.   Review and summary of outside medical records: Patient has a history of allergic bronchitis, currently on Claritin, he has been maintained on Flovent twice daily and albuterol as needed.  He has issues with insomnia, he takes Klonopin 1 mg nightly, his history includes hypertension, depression, schizoaffective  disorder.   Medication:    Current Outpatient Medications:  .  acetaminophen (TYLENOL) 325 MG tablet, Take 650 mg by mouth every 6 (six) hours as needed for pain., Disp: , Rfl:  .  albuterol (PROVENTIL HFA;VENTOLIN HFA) 108 (90 BASE) MCG/ACT inhaler, Inhale 1 puff into the lungs every 6 (six) hours as needed for wheezing., Disp: , Rfl:  .  aspirin EC 81 MG tablet, Take 81 mg by mouth daily., Disp: , Rfl:  .  clonazePAM (KLONOPIN) 1 MG tablet, Take 1 mg 2 (two) times daily by mouth., Disp: , Rfl:  .  DULoxetine (CYMBALTA) 60 MG capsule, Take 60 mg by mouth daily., Disp: , Rfl:  .  fenofibrate (TRICOR) 145 MG tablet, Take 1 tablet by mouth daily., Disp: , Rfl:  .  fluocinonide ointment (LIDEX) 0.05 %, , Disp: , Rfl:  .  Fluticasone Propionate  HFA (FLOVENT HFA IN), Inhale 2 sprays into the lungs 2 (two) times daily., Disp: , Rfl:  .  gabapentin (NEURONTIN) 100 MG capsule, Take 100 mg twice a day for one week, then increase to 200 mg(2 tablets) twice a day and continue, Disp: , Rfl:  .  guaiFENesin (MUCINEX) 600 MG 12 hr tablet, Take 2 (two) times daily by mouth., Disp: , Rfl:  .  levothyroxine (SYNTHROID, LEVOTHROID) 50 MCG tablet, Take 50 mcg daily before breakfast by mouth., Disp: , Rfl:  .  lisinopril (PRINIVIL,ZESTRIL) 20 MG tablet, Take 1 tablet by  mouth daily., Disp: , Rfl:  .  loratadine (CLARITIN) 10 MG tablet, Take 10 mg daily by mouth., Disp: , Rfl:  .  mirabegron ER (MYRBETRIQ) 50 MG TB24 tablet, Take 1 tablet (50 mg total) daily by mouth., Disp: 30 tablet, Rfl: 11 .  Multiple Vitamin (MULTI-VITAMINS) TABS, Take by mouth., Disp: , Rfl:  .  nortriptyline (PAMELOR) 75 MG capsule, , Disp: , Rfl:  .  PRESCRIPTION MEDICATION, Apply 1 application topically 2 (two) times daily as needed (dry skin). triamcinolone 1%  Menthol and camfer 1%, Disp: , Rfl:  .  tamsulosin (FLOMAX) 0.4 MG CAPS capsule, Take 1 capsule (0.4 mg total) by mouth daily., Disp: 10 capsule, Rfl: 0 .  zolpidem (AMBIEN) 5  MG tablet, Take 1 tablet (5 mg total) by mouth at bedtime as needed for sleep., Disp: 5 tablet, Rfl: 0   Allergies:  Saw palmetto; Other; Sulfa antibiotics; and Sulfacetamide sodium    Other findings:    LABORATORY PANEL:   CBC No results for input(s): WBC, HGB, HCT, PLT in the last 168 hours. ------------------------------------------------------------------------------------------------------------------  Chemistries  No results for input(s): NA, K, CL, CO2, GLUCOSE, BUN, CREATININE, CALCIUM, MG, AST, ALT, ALKPHOS, BILITOT in the last 168 hours.  Invalid input(s): GFRCGP ------------------------------------------------------------------------------------------------------------------  Cardiac Enzymes No results for input(s): TROPONINI in the last 168 hours. ------------------------------------------------------------  RADIOLOGY:  No results found.     Thank  you for the consultation and for allowing [facility] to assist in the care of your patient. Our recommendations are noted above.  Please contact us if we can be of further service.   Marda Stalker, M.D., F.C.C.P.  Board Certified in Internal Medicine, Pulmonary Medicine, [place], and Sleep Medicine.  [facility] Office Number: [phone]   05/08/2018

## 2018-05-13 ENCOUNTER — Inpatient Hospital Stay: Payer: 59 | Admitting: Internal Medicine

## 2018-05-14 ENCOUNTER — Encounter: Payer: Self-pay | Admitting: Internal Medicine

## 2018-06-18 ENCOUNTER — Inpatient Hospital Stay: Payer: 59 | Admitting: Internal Medicine

## 2018-09-05 DIAGNOSIS — R42 Dizziness and giddiness: Secondary | ICD-10-CM | POA: Insufficient documentation

## 2018-09-26 NOTE — Progress Notes (Addendum)
09/27/2018 12:16 PM  Dakota Harper 27-Jun-1946 [id number]  Referring provider: Janann Colonel [place], [place] [address]  Chief Complaint  Patient presents with  . Benign Prostatic Hypertrophy   HPI: Patient is a 72 y.o. male with a history of benign prostatic hypertrophy, urinary frequency and nocturia. He presents today for a prostate and testicle exam.   Prior CT scan on 03/2016 showed a left distal uretal stone and repeat CT scan on 06/2016 showed no stone or hydronephrosis.   He complains of frequent urination, urine leakage, trouble starting stream and erection problems.  Urine leakage was a recent incident and considered unusual for him. He states it was due to excessive waiting for the rest room.  Drinks 3 glasses of water daily. He is currently on Myrbetriq and Tamsulosin. Denies lumps in testicles. His IPSS score today is 22/3. His PVR today is 0 mL.  Background history: He was seen on 09/17/2018 by Dr. Matilde Sprang who noted the patient has medical comorbidities with frequency and nocturia. . At that time he was on Avodart and Flomax and interested in stopping Avodart since he did not feel it was an effective form of treatment for his symptoms. He had a CT scan in May 2017 that demonstrated an enlarged prostate and a distal left ureteral stone. Patient was interested in Myrbetriq but wasn't started.    PMH: Past Medical History:  Diagnosis Date  . Asthma   . BPH (benign prostatic hyperplasia)   . Ear pain   . Hypercholesteremia   . Hypertension   . Neuropathy   . Sleep disorder    Surgical History: Past Surgical History:  Procedure Laterality Date  . CHOLECYSTECTOMY    . HERNIA REPAIR     Home Medications:  Allergies as of 09/27/2018      Reactions   Saw Palmetto Rash, Hives   "Berry form"   Other    Sulfa Antibiotics Rash   Sulfacetamide Sodium Rash      Medication List        Accurate as of 09/27/18 12:16 PM. Always use your  most recent med list.          acetaminophen 325 MG tablet Commonly known as:  TYLENOL Take 650 mg by mouth every 6 (six) hours as needed for pain.   albuterol 108 (90 Base) MCG/ACT inhaler Commonly known as:  PROVENTIL HFA;VENTOLIN HFA Inhale 1 puff into the lungs every 6 (six) hours as needed for wheezing.   aspirin EC 81 MG tablet Take 81 mg by mouth daily.   clonazePAM 1 MG tablet Commonly known as:  KLONOPIN Take 1 mg 2 (two) times daily by mouth.   DULoxetine 60 MG capsule Commonly known as:  CYMBALTA Take 60 mg by mouth daily.   fenofibrate 145 MG tablet Commonly known as:  TRICOR Take 1 tablet by mouth daily.   FLOVENT HFA IN Inhale 2 sprays into the lungs 2 (two) times daily.   fluocinonide ointment 0.05 % Commonly known as:  LIDEX   gabapentin 100 MG capsule Commonly known as:  NEURONTIN Take 100 mg twice a day for one week, then increase to 200 mg(2 tablets) twice a day and continue   guaiFENesin 600 MG 12 hr tablet Commonly known as:  MUCINEX Take 2 (two) times daily by mouth.   levothyroxine 50 MCG tablet Commonly known as:  SYNTHROID, LEVOTHROID Take 50 mcg daily before breakfast by mouth.   lisinopril 20 MG tablet Commonly known  as:  PRINIVIL,ZESTRIL Take 1 tablet by mouth daily.   loratadine 10 MG tablet Commonly known as:  CLARITIN Take 10 mg daily by mouth.   mirabegron ER 50 MG Tb24 tablet Commonly known as:  MYRBETRIQ Take 1 tablet (50 mg total) daily by mouth.   MULTI-VITAMINS Tabs Take by mouth.   nortriptyline 75 MG capsule Commonly known as:  PAMELOR   PRESCRIPTION MEDICATION Apply 1 application topically 2 (two) times daily as needed (dry skin). triamcinolone 1%  Menthol and camfer 1%   tamsulosin 0.4 MG Caps capsule Commonly known as:  FLOMAX Take 1 capsule (0.4 mg total) by mouth daily.   zolpidem 5 MG tablet Commonly known as:  AMBIEN Take 1 tablet (5 mg total) by mouth at bedtime as needed for sleep.       Allergies:  Allergies  Allergen Reactions  . Saw Palmetto Rash and Hives    "Berry form"  . Other   . Sulfa Antibiotics Rash  . Sulfacetamide Sodium Rash   Family History: Family History  Problem Relation Age of Onset  . Prostate cancer Neg Hx   . Bladder Cancer Neg Hx   . Kidney cancer Neg Hx    Social History:  reports that he has never smoked. He has never used smokeless tobacco. He reports that he does not drink alcohol or use drugs.  ROS: UROLOGY Frequent Urination?: Yes Hard to postpone urination?: No Burning/pain with urination?: No Get up at night to urinate?: No Leakage of urine?: Yes Urine stream starts and stops?: No Trouble starting stream?: Yes Do you have to strain to urinate?: No Blood in urine?: No Urinary tract infection?: No Sexually transmitted disease?: No Injury to kidneys or bladder?: No Painful intercourse?: No Weak stream?: No Erection problems?: Yes Penile pain?: No Gastrointestinal Nausea?: No Vomiting?: No Indigestion/heartburn?: No Diarrhea?: No Constipation?: No Constitutional Fever: No Night sweats?: No Weight loss?: No Fatigue?: Yes Skin Skin rash/lesions?: No Itching?: No Eyes Blurred vision?: No Double vision?: No Ears/Nose/Throat Sore throat?: No Sinus problems?: Yes Hematologic/Lymphatic Swollen glands?: No Easy bruising?: No Cardiovascular Leg swelling?: No Chest pain?: No Respiratory Cough?: Yes Shortness of breath?: Yes Endocrine Excessive thirst?: No Musculoskeletal Back pain?: No Joint pain?: No Neurological Headaches?: Yes Dizziness?: Yes Psychologic Depression?: Yes Anxiety?: Yes  Physical Exam: BP 131/71   Pulse (!) 106   Ht 5\' 8"  (1.727 m)   Wt 200 lb (90.7 kg)   BMI 30.41 kg/m   Constitutional:  Well nourished. Alert and oriented, No acute distress. GU: No CVA tenderness.  No bladder fullness or masses.  Patient with circumcised phallus.  Urethral meatus is patent.  No penile  discharge. No penile lesions or rashes. Scrotum without lesions, cysts, rashes and/or edema.  Testicles are located scrotally bilaterally. No masses are appreciated in the testicles. Left and right epididymis are normal. Rectal: Patient with  normal sphincter tone. Anus and perineum without scarring or rashes. No rectal masses are appreciated. Prostate is approximately 60 grams, no nodules are appreciated. Seminal vesicles are normal. Neurologic: Grossly intact, no focal deficits, moving all 4 extremities. Psychiatric: Normal mood and affect.  Laboratory Data: Lab Results  Component Value Date   WBC 9.5 04/02/2016   HGB 15.2 04/02/2016   HCT 44.5 04/02/2016   MCV 88.1 04/02/2016   PLT 257 04/02/2016   Lab Results  Component Value Date   CREATININE 1.53 (H) 03/31/2016   No results found for: PSA No results found for: TESTOSTERONE No results found for:  HGBA1C No results found for: TSH No results found for: CHOL, HDL, CHOLHDL, VLDL, LDLCALC  Lab Results  Component Value Date   AST 23 10/01/2015   Lab Results  Component Value Date   ALT 26 10/01/2015   No components found for: ALKALINEPHOPHATASE No components found for: BILIRUBINTOTAL No results found for: ESTRADIOL  Urinalysis    Component Value Date/Time   COLORURINE YELLOW (A) 04/02/2016 1522   APPEARANCEUR Clear 09/17/2017 0905   LABSPEC 1.021 04/02/2016 1522   PHURINE 5.0 04/02/2016 1522   GLUCOSEU Negative 09/17/2017 0905   HGBUR 2+ (A) 04/02/2016 1522   BILIRUBINUR Negative 09/17/2017 0905   KETONESUR NEGATIVE 04/02/2016 1522   PROTEINUR 2+ (A) 09/17/2017 0905   PROTEINUR 30 (A) 04/02/2016 1522   NITRITE Negative 09/17/2017 0905   NITRITE NEGATIVE 04/02/2016 1522   LEUKOCYTESUR Negative 09/17/2017 0905   I have reviewed the labs.   Pertinent Imaging:  CT scan on 03/2016 showed a left distal uretal stone and repeat CT scan on 06/2016 showed no stone or hydronephrosis.   I have independently reviewed the  films.   Results for ABRIAN, HANOVER (MRN [id number]) as of 10/06/2018 17:50  Ref. Range 10/06/2018 17:50  Scan Result Unknown 0 mL   Assessment & Plan:    1. Benign Prostatic Hypertrophy IPSS score is 22/3 Continue conservative management, avoiding bladder irritants and timed voiding's Most bothersome symptoms is/are frequency Continue tamsulosin 0.4 mg daily RTC in 12 months for IPSS, PVR and exam   2. Frequency Continue the Myrbetriq 25 mg; refills given   Return in about 1 year (around 09/28/2019) for IPSS and PVR.  These notes generated with voice recognition software. I apologize for typographical errors.  Laneta Simmers  [facility] [address]  [place], [place] [address] [phone]  I, Temidayo Atanda-Ogunleye , am acting as a Education administrator for [employer], PA-C.   I have reviewed the above documentation for accuracy and completeness, and I agree with the above.    Zara Council, PA-C

## 2018-09-27 ENCOUNTER — Encounter: Payer: Self-pay | Admitting: Urology

## 2018-09-27 ENCOUNTER — Ambulatory Visit (INDEPENDENT_AMBULATORY_CARE_PROVIDER_SITE_OTHER): Payer: 59 | Admitting: Urology

## 2018-09-27 ENCOUNTER — Other Ambulatory Visit: Payer: Self-pay

## 2018-09-27 VITALS — BP 131/71 | HR 106 | Ht 68.0 in | Wt 200.0 lb

## 2018-09-27 DIAGNOSIS — N400 Enlarged prostate without lower urinary tract symptoms: Secondary | ICD-10-CM | POA: Diagnosis not present

## 2018-09-27 DIAGNOSIS — R350 Frequency of micturition: Secondary | ICD-10-CM

## 2018-10-06 LAB — BLADDER SCAN AMB NON-IMAGING

## 2018-10-06 NOTE — Addendum Note (Signed)
Addended by: Zara Council A on: 10/06/2018 05:51 PM   Modules accepted: Orders

## 2019-05-01 ENCOUNTER — Telehealth: Payer: Self-pay | Admitting: Internal Medicine

## 2019-05-01 ENCOUNTER — Ambulatory Visit (INDEPENDENT_AMBULATORY_CARE_PROVIDER_SITE_OTHER): Payer: 59 | Admitting: Internal Medicine

## 2019-05-01 DIAGNOSIS — J4550 Severe persistent asthma, uncomplicated: Secondary | ICD-10-CM

## 2019-05-01 DIAGNOSIS — R05 Cough: Secondary | ICD-10-CM | POA: Diagnosis not present

## 2019-05-01 DIAGNOSIS — R059 Cough, unspecified: Secondary | ICD-10-CM

## 2019-05-01 MED ORDER — BENZONATATE 100 MG PO CAPS: 200.0000 mg | ORAL_CAPSULE | Freq: Three times a day (TID) | ORAL | 3 refills | Status: DC

## 2019-05-01 NOTE — Telephone Encounter (Signed)
Called and spoke to [place] at the [facility]. The patient has had a cough and the in-house doctor believes it is just allergies, however the patient would like to schedule an apt just to make sure. Not sure how long this cough has been going on, they are unsure of any other symptoms. Scheduled for phone visit this afternoon.

## 2019-05-01 NOTE — Patient Instructions (Signed)
Will send fax with orders for chest xray and tessalon perles to attention of [facility] at fax [phone]

## 2019-05-01 NOTE — Progress Notes (Signed)
[facility] Consultation      Assessment and Plan:  Asthma with chronic cough. - Patient using rescue inhaler 3 times daily. -Lisinopril was stopped without improvement in cough.  Cough did not improve with trial of nasal steroid. - We will start trial of Tessalon, will check chest x-ray. Per pt request: Will send fax with orders for chest xray and tessalon perles to attention of [facility] at fax [phone]   Date: 05/01/2019  MRN# [id number] Dakota Harper [birth year]    Dakota Harper is a 73 y.o. old male seen in consultation for chief complaint of: DYSPNEA, COUGH.      HPI:  Dakota Harper is a 73 y.o. male with a history of headaches as well as a history of obstructive sleep apnea, he lives at [facility] which is assisted living.  He was seen recently with asthma with chronic cough thought to be related to allergies.  His lisinopril was stopped, was started on Nasacort and asked to return in 6 weeks if symptoms have not improved. His cough did not improve.  He denies reflux.  Since his last visit he has continued on flovent twice daily and albuterol. He did not notice an improvement in the cough with nasal spray. He takes robitussin twice daily. He has not been tried on tessalon.   [facility] He has insomnia for which he takes ambien XR. He has been on multiple medications for insomnia for several years including seroquel, restoril, temazepam.   Review and summary of outside medical records: Patient has a history of allergic bronchitis, currently on Claritin, he has been maintained on Flovent twice daily and albuterol as needed.  He has issues with insomnia, he takes Klonopin 1 mg nightly, his history includes hypertension, depression, schizoaffective disorder.  Medication:    Current Outpatient Medications:  .  acetaminophen (TYLENOL) 325 MG tablet, Take 650 mg by mouth every 6 (six) hours as needed  for pain., Disp: , Rfl:  .  albuterol (PROVENTIL HFA;VENTOLIN HFA) 108 (90 BASE) MCG/ACT inhaler, Inhale 1 puff into the lungs every 6 (six) hours as needed for wheezing., Disp: , Rfl:  .  aspirin EC 81 MG tablet, Take 81 mg by mouth daily., Disp: , Rfl:  .  clonazePAM (KLONOPIN) 1 MG tablet, Take 1 mg 2 (two) times daily by mouth., Disp: , Rfl:  .  DULoxetine (CYMBALTA) 60 MG capsule, Take 60 mg by mouth daily., Disp: , Rfl:  .  fenofibrate (TRICOR) 145 MG tablet, Take 1 tablet by mouth daily., Disp: , Rfl:  .  fluocinonide ointment (LIDEX) 0.05 %, , Disp: , Rfl:  .  Fluticasone Propionate  HFA (FLOVENT HFA IN), Inhale 2 sprays into the lungs 2 (two) times daily., Disp: , Rfl:  .  gabapentin (NEURONTIN) 100 MG capsule, Take 100 mg twice a day for one week, then increase to 200 mg(2 tablets) twice a day and continue, Disp: , Rfl:  .  guaiFENesin (MUCINEX) 600 MG 12 hr tablet, Take 2 (two) times daily by mouth., Disp: , Rfl:  .  levothyroxine (SYNTHROID, LEVOTHROID) 50 MCG tablet, Take 50 mcg daily before breakfast by mouth., Disp: , Rfl:  .  lisinopril (PRINIVIL,ZESTRIL) 20 MG tablet, Take 1 tablet by mouth daily., Disp: , Rfl:  .  loratadine (CLARITIN) 10 MG tablet, Take 10 mg daily by mouth., Disp: , Rfl:  .  mirabegron ER (MYRBETRIQ) 50 MG TB24 tablet,  Take 1 tablet (50 mg total) daily by mouth., Disp: 30 tablet, Rfl: 11 .  Multiple Vitamin (MULTI-VITAMINS) TABS, Take by mouth., Disp: , Rfl:  .  nortriptyline (PAMELOR) 75 MG capsule, , Disp: , Rfl:  .  PRESCRIPTION MEDICATION, Apply 1 application topically 2 (two) times daily as needed (dry skin). triamcinolone 1%  Menthol and camfer 1%, Disp: , Rfl:  .  tamsulosin (FLOMAX) 0.4 MG CAPS capsule, Take 1 capsule (0.4 mg total) by mouth daily., Disp: 10 capsule, Rfl: 0 .  zolpidem (AMBIEN) 5 MG tablet, Take 1 tablet (5 mg total) by mouth at bedtime as needed for sleep., Disp: 5 tablet, Rfl: 0   Allergies:  Saw palmetto, Other, Sulfa antibiotics,  and Sulfacetamide sodium  Review of Systems:  Constitutional: Feels well. Cardiovascular: Denies chest pain, exertional chest pain.  Pulmonary: Denies hemoptysis, pleuritic chest pain.   The remainder of systems were reviewed and were found to be negative other than what is documented in the HPI.       LABORATORY PANEL:   CBC No results for input(s): WBC, HGB, HCT, PLT in the last 168 hours. ------------------------------------------------------------------------------------------------------------------  Chemistries  No results for input(s): NA, K, CL, CO2, GLUCOSE, BUN, CREATININE, CALCIUM, MG, AST, ALT, ALKPHOS, BILITOT in the last 168 hours.  Invalid input(s): GFRCGP ------------------------------------------------------------------------------------------------------------------  Cardiac Enzymes No results for input(s): TROPONINI in the last 168 hours. ------------------------------------------------------------  RADIOLOGY:  No results found.     Thank  you for the consultation and for allowing [facility] to assist in the care of your patient. Our recommendations are noted above.  Please contact us if we can be of further service.  Marda Stalker, M.D., F.C.C.P.  Board Certified in Internal Medicine, Pulmonary Medicine, [place], and Sleep Medicine.  [facility] Office Number: [phone]  05/01/2019

## 2019-05-02 ENCOUNTER — Telehealth: Payer: Self-pay

## 2019-05-02 NOTE — Telephone Encounter (Signed)
Left message for [facility] to schedule 6wk rov.

## 2019-05-05 NOTE — Telephone Encounter (Signed)
[facility]C [facility] and received message that they were busy with residents at this time.  Will call back.

## 2019-05-05 NOTE — Telephone Encounter (Signed)
Spoke to medtech at [facility] and was advised to call back in 1hr, as schedulers are currently unavailable.

## 2019-05-05 NOTE — Telephone Encounter (Signed)
I spoke to [name], scheduled f/u apt for 06/19/19 for a phone visit.

## 2019-05-07 ENCOUNTER — Telehealth: Payer: Self-pay | Admitting: Internal Medicine

## 2019-05-07 NOTE — Telephone Encounter (Signed)
Spoke to patient, he had the CXR taken at [facility]. I let him know we do not have the results. He stated he is seeing his [facility] doctor tomorrow and she can send over the results. Will await results.

## 2019-05-07 NOTE — Telephone Encounter (Signed)
Attempted to call back, no answer, unable to leave a message. I can not see where he had the CXR.

## 2019-05-12 NOTE — Telephone Encounter (Signed)
Called pt re CXR results. Advised pt that we never received the results from his PCP. Pt stated that his PCP told him his CXR was normal. No further action necessary at this time.

## 2019-06-03 ENCOUNTER — Ambulatory Visit (INDEPENDENT_AMBULATORY_CARE_PROVIDER_SITE_OTHER): Payer: 59 | Admitting: Internal Medicine

## 2019-06-03 DIAGNOSIS — J4550 Severe persistent asthma, uncomplicated: Secondary | ICD-10-CM | POA: Diagnosis not present

## 2019-06-03 DIAGNOSIS — R05 Cough: Secondary | ICD-10-CM | POA: Diagnosis not present

## 2019-06-03 DIAGNOSIS — R059 Cough, unspecified: Secondary | ICD-10-CM

## 2019-06-03 NOTE — Progress Notes (Signed)
[facility] Consultation    Virtual Visit via Video Note I connected with patient on 06/03/19 at 11:15 AM EDT by video and verified that I am speaking with the correct person using two identifiers.   I discussed the limitations, risks of performing an evaluation and management service by video and the availability of in person appointments. I also discussed with the patient that there may be a patient responsible charge related to this service.  In light of current covid-19 pandemic, patient also understands that we are trying to protect them by minimizing in office contact if at all possible.  The patient expressed understanding and agreed to proceed. Please see note below for further detail.    The patient was advised to call back or seek an in-person evaluation if the symptoms worsen or if the condition fails to improve as anticipated. I spent 15 minutes of face-to-face time during this visit.   Dakota Hobby, MD   Assessment and Plan:  Asthma with chronic cough and chronic bronchitis.  - Patient using rescue inhaler 3 times daily. -Lisinopril was stopped without improvement in cough.  Cough did not improve with trial of nasal steroid. - We will start trial of Tessalon, will check chest x-ray. Per pt request: Will send fax with orders for chest xray and tessalon perles to attention of [facility] at fax [phone] --Continue current medications, he would like to try advair, ok to use this in the place of flovent as a trial. He is going to get it from his PCP.  Return if symptoms worsen or fail to improve.  Date: 06/03/2019  MRN# [id number] Dakota Harper [birth year]    Dakota Harper is a 73 y.o. old male seen in consultation for chief complaint of: DYSPNEA, COUGH.      HPI:  Dakota Harper is a 73 y.o. male with a history of headaches as well as a history of obstructive sleep apnea, he lives at [facility] which is assisted living.   He was seen recently with asthma with chronic cough thought to be related to allergies.  His lisinopril was stopped, was started on Nasacort and asked to return in 6 weeks if symptoms have not improved. His cough did not improve.  He denies reflux.  Since his last visit he underwent a chest x-ray which was normal, he was started on a trial of Tessalon.  Since his last phone visit he started the tessalon and after 4-5 days they helped for about a week. He has been having some chest congestion and started mucinex which helped. The congestion happened a few weeks later but the mucinex did not help. He has also tried robitussin which has helped.  He has remained on the tessalon. His doctor is going to change him to singulair. He is currently taking flovent 2 puffs twice per day and proair as needed.     [facility] He has insomnia for which he takes ambien XR. He has been on multiple medications for insomnia for several years including seroquel, restoril, temazepam.   Chest x-ray 05/06/2019>> outside x-ray report.  Reported as normal. PSG 11/27/2017>> AHI of 0.3, negative for sleep apnea.  PLMI 100 with PLM arousal index of 3. Review and summary of outside medical records: Patient has a history of allergic bronchitis, currently on Claritin, he has been maintained on Flovent twice daily and albuterol as needed.  He has issues with insomnia, he  takes Klonopin 1 mg nightly, his history includes hypertension, depression, schizoaffective disorder.  Medication:    Current Outpatient Medications:  .  acetaminophen (TYLENOL) 325 MG tablet, Take 650 mg by mouth every 6 (six) hours as needed for pain., Disp: , Rfl:  .  albuterol (PROVENTIL HFA;VENTOLIN HFA) 108 (90 BASE) MCG/ACT inhaler, Inhale 1 puff into the lungs every 6 (six) hours as needed for wheezing., Disp: , Rfl:  .  aspirin EC 81 MG tablet, Take 81 mg by mouth daily., Disp: , Rfl:  .  benzonatate (TESSALON PERLES)  100 MG capsule, Take 2 capsules (200 mg total) by mouth 3 (three) times daily., Disp: 540 capsule, Rfl: 3 .  clonazePAM (KLONOPIN) 1 MG tablet, Take 1 mg 2 (two) times daily by mouth., Disp: , Rfl:  .  DULoxetine (CYMBALTA) 60 MG capsule, Take 60 mg by mouth daily., Disp: , Rfl:  .  fenofibrate (TRICOR) 145 MG tablet, Take 1 tablet by mouth daily., Disp: , Rfl:  .  fluocinonide ointment (LIDEX) 0.05 %, , Disp: , Rfl:  .  Fluticasone Propionate  HFA (FLOVENT HFA IN), Inhale 2 sprays into the lungs 2 (two) times daily., Disp: , Rfl:  .  gabapentin (NEURONTIN) 100 MG capsule, Take 100 mg twice a day for one week, then increase to 200 mg(2 tablets) twice a day and continue, Disp: , Rfl:  .  guaiFENesin (MUCINEX) 600 MG 12 hr tablet, Take 2 (two) times daily by mouth., Disp: , Rfl:  .  levothyroxine (SYNTHROID, LEVOTHROID) 50 MCG tablet, Take 50 mcg daily before breakfast by mouth., Disp: , Rfl:  .  lisinopril (PRINIVIL,ZESTRIL) 20 MG tablet, Take 1 tablet by mouth daily., Disp: , Rfl:  .  loratadine (CLARITIN) 10 MG tablet, Take 10 mg daily by mouth., Disp: , Rfl:  .  mirabegron ER (MYRBETRIQ) 50 MG TB24 tablet, Take 1 tablet (50 mg total) daily by mouth., Disp: 30 tablet, Rfl: 11 .  Multiple Vitamin (MULTI-VITAMINS) TABS, Take by mouth., Disp: , Rfl:  .  nortriptyline (PAMELOR) 75 MG capsule, , Disp: , Rfl:  .  PRESCRIPTION MEDICATION, Apply 1 application topically 2 (two) times daily as needed (dry skin). triamcinolone 1%  Menthol and camfer 1%, Disp: , Rfl:  .  tamsulosin (FLOMAX) 0.4 MG CAPS capsule, Take 1 capsule (0.4 mg total) by mouth daily., Disp: 10 capsule, Rfl: 0 .  zolpidem (AMBIEN) 5 MG tablet, Take 1 tablet (5 mg total) by mouth at bedtime as needed for sleep., Disp: 5 tablet, Rfl: 0   Allergies:  Saw palmetto, Other, Sulfa antibiotics, and Sulfacetamide sodium  Review of Systems:  Constitutional: Feels well. Cardiovascular: Denies chest pain, exertional chest pain.  Pulmonary:  Denies hemoptysis, pleuritic chest pain.   The remainder of systems were reviewed and were found to be negative other than what is documented in the HPI.        LABORATORY PANEL:   CBC No results for input(s): WBC, HGB, HCT, PLT in the last 168 hours. ------------------------------------------------------------------------------------------------------------------  Chemistries  No results for input(s): NA, K, CL, CO2, GLUCOSE, BUN, CREATININE, CALCIUM, MG, AST, ALT, ALKPHOS, BILITOT in the last 168 hours.  Invalid input(s): GFRCGP ------------------------------------------------------------------------------------------------------------------  Cardiac Enzymes No results for input(s): TROPONINI in the last 168 hours. ------------------------------------------------------------  RADIOLOGY:  No results found.     Thank  you for the consultation and for allowing [facility] to assist in the care of your patient. Our recommendations are noted above.  Please  contact us if we can be of further service.  Marda Stalker, M.D., F.C.C.P.  Board Certified in Internal Medicine, Pulmonary Medicine, [place], and Sleep Medicine.  [facility] Office Number: [phone]  06/03/2019

## 2019-06-03 NOTE — Patient Instructions (Signed)
Continue to use your current medications.  Ok to to try advair in place of flovent, call us if you would like me to prescribe this.

## 2019-06-19 ENCOUNTER — Ambulatory Visit: Payer: 59 | Admitting: Internal Medicine

## 2019-11-11 ENCOUNTER — Emergency Department: Payer: 59

## 2019-11-11 ENCOUNTER — Encounter: Payer: Self-pay | Admitting: Emergency Medicine

## 2019-11-11 ENCOUNTER — Other Ambulatory Visit: Payer: Self-pay

## 2019-11-11 ENCOUNTER — Emergency Department
Admission: EM | Admit: 2019-11-11 | Discharge: 2019-11-11 | Disposition: A | Discharge status: Home / Self Care | Payer: 59 | Attending: Emergency Medicine | Admitting: Emergency Medicine

## 2019-11-11 DIAGNOSIS — Y92129 Unspecified place in nursing home as the place of occurrence of the external cause: Secondary | ICD-10-CM | POA: Diagnosis not present

## 2019-11-11 DIAGNOSIS — S82044A Nondisplaced comminuted fracture of right patella, initial encounter for closed fracture: Secondary | ICD-10-CM | POA: Diagnosis not present

## 2019-11-11 DIAGNOSIS — S82001A Unspecified fracture of right patella, initial encounter for closed fracture: Secondary | ICD-10-CM

## 2019-11-11 DIAGNOSIS — W010XXA Fall on same level from slipping, tripping and stumbling without subsequent striking against object, initial encounter: Secondary | ICD-10-CM | POA: Insufficient documentation

## 2019-11-11 DIAGNOSIS — J45909 Unspecified asthma, uncomplicated: Secondary | ICD-10-CM | POA: Insufficient documentation

## 2019-11-11 DIAGNOSIS — Z7982 Long term (current) use of aspirin: Secondary | ICD-10-CM | POA: Insufficient documentation

## 2019-11-11 DIAGNOSIS — Y9389 Activity, other specified: Secondary | ICD-10-CM | POA: Diagnosis not present

## 2019-11-11 DIAGNOSIS — Z79899 Other long term (current) drug therapy: Secondary | ICD-10-CM | POA: Diagnosis not present

## 2019-11-11 DIAGNOSIS — Y999 Unspecified external cause status: Secondary | ICD-10-CM | POA: Insufficient documentation

## 2019-11-11 DIAGNOSIS — I10 Essential (primary) hypertension: Secondary | ICD-10-CM | POA: Insufficient documentation

## 2019-11-11 DIAGNOSIS — S8991XA Unspecified injury of right lower leg, initial encounter: Secondary | ICD-10-CM | POA: Diagnosis present

## 2019-11-11 MED ORDER — HYDROCODONE-ACETAMINOPHEN 5-325 MG PO TABS: 1.0000 | ORAL_TABLET | Freq: Once | ORAL | Status: DC

## 2019-11-11 MED ORDER — HYDROCODONE-ACETAMINOPHEN 5-325 MG PO TABS: 1.0000 | ORAL_TABLET | Freq: Four times a day (QID) | ORAL | 0 refills | Status: DC | PRN

## 2019-11-11 NOTE — ED Notes (Signed)
See triage note  States he fell yesterday at [place] [facility]   Having pain to right knee   Having increased pain with movement

## 2019-11-11 NOTE — Discharge Instructions (Signed)
Call make an appointment with Dr. Posey Pronto who is the orthopedist on-call for [facility].  Wear the knee immobilizer when you are up walking for protection and support.  X-rays show that you have a fracture to your kneecap.  You may need to use ice and elevate as needed for pain and swelling.  Continue your regular medication.

## 2019-11-11 NOTE — ED Provider Notes (Signed)
[facility] Emergency Department Provider Note  ____________________________________________   First MD Initiated Contact with Patient 11/11/19 [phone]     (approximate)  I have reviewed the triage vital signs and the nursing notes.   HISTORY  Chief Complaint Knee Pain   HPI Dakota Harper is a 74 y.o. male presents to the ED via EMS after he fell at the [facility] yesterday and continues to have right knee pain.  Patient states that he was getting up without the use of his walker which she normally uses.  Patient denies any head injury or loss of consciousness.  Patient reports that he was positive for Covid on 11/05/2019.  He denies any worsening of his symptoms and states that he is beginning to feel much better.  He denies any recent fever or chills.  He states that he is eating better and is only here to be seen about his knee.  Currently rates his pain as a 10/10.     Past Medical History:  Diagnosis Date  . Asthma   . BPH (benign prostatic hyperplasia)   . Ear pain   . Hypercholesteremia   . Hypertension   . Neuropathy   . Sleep disorder     There are no problems to display for this patient.   Past Surgical History:  Procedure Laterality Date  . CHOLECYSTECTOMY    . HERNIA REPAIR      Prior to Admission medications   Medication Sig Start Date End Date Taking? Authorizing Provider  acetaminophen (TYLENOL) 325 MG tablet Take 650 mg by mouth every 6 (six) hours as needed for pain.    [provider]  albuterol (PROVENTIL HFA;VENTOLIN HFA) 108 (90 BASE) MCG/ACT inhaler Inhale 1 puff into the lungs every 6 (six) hours as needed for wheezing.    [provider]  aspirin EC 81 MG tablet Take 81 mg by mouth daily.    [provider]  benzonatate (TESSALON PERLES) 100 MG capsule Take 2 capsules (200 mg total) by mouth 3 (three) times daily. 05/01/19 04/30/20  Laverle Hobby, MD  clonazePAM (KLONOPIN) 1 MG tablet Take 1 mg  2 (two) times daily by mouth.    [provider]  DULoxetine (CYMBALTA) 60 MG capsule Take 60 mg by mouth daily.    [provider]  fenofibrate (TRICOR) 145 MG tablet Take 1 tablet by mouth daily. 02/03/18   [provider]  fluocinonide ointment (LIDEX) 0.05 %  03/13/18   [provider]  Fluticasone Propionate  HFA (FLOVENT HFA IN) Inhale 2 sprays into the lungs 2 (two) times daily.    [provider]  gabapentin (NEURONTIN) 100 MG capsule Take 100 mg twice a day for one week, then increase to 200 mg(2 tablets) twice a day and continue 03/22/18   [provider]  guaiFENesin (MUCINEX) 600 MG 12 hr tablet Take 2 (two) times daily by mouth.    [provider]  HYDROcodone-acetaminophen (NORCO/VICODIN) 5-325 MG tablet Take 1 tablet by mouth every 6 (six) hours as needed for moderate pain. 11/11/19   Johnn Hai, PA-C  levothyroxine (SYNTHROID, LEVOTHROID) 50 MCG tablet Take 50 mcg daily before breakfast by mouth.    [provider]  lisinopril (PRINIVIL,ZESTRIL) 20 MG tablet Take 1 tablet by mouth daily. 03/05/18   [provider]  loratadine (CLARITIN) 10 MG tablet Take 10 mg daily by mouth.    [provider]  mirabegron ER (MYRBETRIQ) 50 MG TB24 tablet Take  1 tablet (50 mg total) daily by mouth. 09/17/17   Bjorn Loser, MD  Multiple Vitamin (MULTI-VITAMINS) TABS Take by mouth.    [provider]  nortriptyline (PAMELOR) 75 MG capsule  04/04/18   [provider]  PRESCRIPTION MEDICATION Apply 1 application topically 2 (two) times daily as needed (dry skin). triamcinolone 1%  Menthol and camfer 1%    [provider]  tamsulosin (FLOMAX) 0.4 MG CAPS capsule Take 1 capsule (0.4 mg total) by mouth daily. 04/02/16   Schuyler Amor, MD  zolpidem (AMBIEN) 5 MG tablet Take 1 tablet (5 mg total) by mouth at bedtime as needed for sleep. 10/02/15   Joanne Gavel, MD    Allergies Saw  palmetto, Other, Sulfa antibiotics, and Sulfacetamide sodium  Family History  Problem Relation Age of Onset  . Prostate cancer Neg Hx   . Bladder Cancer Neg Hx   . Kidney cancer Neg Hx     Social History Social History   Tobacco Use  . Smoking status: Never Smoker  . Smokeless tobacco: Never Used  Substance Use Topics  . Alcohol use: No  . Drug use: No    Review of Systems Constitutional: No fever/chills Eyes: No visual changes. ENT: No sore throat. Cardiovascular: Denies chest pain. Respiratory: Denies shortness of breath. Gastrointestinal: No abdominal pain.  No nausea, no vomiting.  No diarrhea. Genitourinary: Negative for dysuria. Musculoskeletal: Positive for right knee pain. Skin: Negative for rash. Neurological: Negative for headaches, focal weakness or numbness. ___________________________________________   PHYSICAL EXAM:  VITAL SIGNS: ED Triage Vitals  Enc Vitals Group     BP 11/11/19 0329 (!) 100/47     Pulse Rate 11/11/19 0329 (!) 105     Resp 11/11/19 0329 18     Temp 11/11/19 0329 98.6 F (37 C)     Temp Source 11/11/19 0329 Oral     SpO2 11/11/19 0329 97 %     Weight 11/11/19 0327 215 lb (97.5 kg)     Height 11/11/19 0327 5\' 9"  (1.753 m)     Head Circumference --      Peak Flow --      Pain Score 11/11/19 0327 10     Pain Loc --      Pain Edu? --      Excl. in [place]? --    Constitutional: Alert and oriented. Well appearing and in no acute distress. Eyes: Conjunctivae are normal.  Head: Atraumatic. Nose: No congestion/rhinnorhea. Neck: No stridor.  Point tenderness on palpation of cervical spine posteriorly.  No evidence of abrasions or discoloration. Cardiovascular: Normal rate, regular rhythm. Grossly normal heart sounds.  Good peripheral circulation. Respiratory: Normal respiratory effort.  No retractions. Lungs CTAB. Gastrointestinal: Soft and nontender. No distention. Musculoskeletal: On examination of the right knee there is no gross  deformity however there is moderate tenderness on palpation of the patella with diffuse soft tissue edema.  Range of motion is guarded secondary to discomfort.  Skin is intact without discoloration.  Patient has no tenderness on palpation above or below his knee.  Nontender to palpation right hip area. Neurologic:  Normal speech and language. No gross focal neurologic deficits are appreciated.  Skin:  Skin is warm, dry and intact.  Psychiatric: Mood and affect are normal. Speech and behavior are normal.  ____________________________________________   LABS (all labs ordered are listed, but only abnormal results are displayed)  Labs Reviewed - No data to display ____________________________________________  RADIOLOGY  Official radiology report(s): DG  Knee Complete 4 Views Right  Result Date: 11/11/2019 CLINICAL DATA:  Right knee pain after fall yesterday morning EXAM: RIGHT KNEE - COMPLETE 4+ VIEW COMPARISON:  None. FINDINGS: There is some questionable irregular lucency through the superior pole of the patella incompletely assessed on these four views. Consider further evaluation with axial/sunrise view. Mild thickening of the distal quadriceps tendon is noted as well with prepatellar soft tissue thickening and some stranding in Hoffa's fat pad. No other acute osseous or soft tissue abnormality. No traumatic malalignment. IMPRESSION: 1. Possible fracture lucency through the superior pole of the patella. Correlate for point tenderness and consider further evaluation with axial/sunrise view or consider cross-sectional imaging. 2. Mild thickening of the distal quadriceps tendon, prepatellar soft tissue thickening and stranding in Hoffa's fat pad. 3. No other acute osseous abnormality or traumatic malalignment. Electronically Signed   By: Lovena Le M.D.   On: 11/11/2019 04:50    ____________________________________________   PROCEDURES  Procedure(s) performed (including Critical  Care):  Procedures Knee immobilizer was applied by ED staff.  ____________________________________________   INITIAL IMPRESSION / ASSESSMENT AND PLAN / ED COURSE  As part of my medical decision making, I reviewed the following data within the electronic MEDICAL RECORD NUMBER Notes from prior ED visits and [place] Controlled Substance Database  74 year old male presents to the ED after a fall this morning at the [facility].  Patient states that he got up without using his walker which he normally does.  He states that he fell injuring his right knee.  He states pain is increased with trying to ambulate.  Patient was brought to the ED via EMS.  X-rays are suggestive of a nondisplaced fracture of the patella and clinically patient is tender in this area.  Patient was placed in a knee immobilizer and given Norco while in the ED.  He was returned to the [facility] via EMS as they did not have any transportation.  He is to follow-up with Dr. Posey Pronto who is on-call for orthopedics [facility] clinic.  Patient is also given instructions to ice and elevate as needed for swelling and help with pain.  A prescription for Norco was sent to his pharmacy to be delivered the nursing facility.   ____________________________________________   FINAL CLINICAL IMPRESSION(S) / ED DIAGNOSES  Final diagnoses:  Closed nondisplaced fracture of right patella, unspecified fracture morphology, initial encounter     ED Discharge Orders         Ordered    HYDROcodone-acetaminophen (NORCO/VICODIN) 5-325 MG tablet  Every 6 hours PRN     11/11/19 0936           Note:  This document was prepared using Dragon voice recognition software and may include unintentional dictation errors.    Johnn Hai, PA-C 11/11/19 1053    Lavonia Drafts, MD 11/11/19 1234

## 2019-11-11 NOTE — ED Triage Notes (Signed)
Pt to triage via w/c, mask in place, with no distress noted; brought in by EMS from [facility] for c/o rt knee pain after falling yesterday morning after getting up without his walker; pt able to weight bear with pain; denies any other c/o or injuries; +COVID on 12/30

## 2019-11-25 ENCOUNTER — Inpatient Hospital Stay
Admission: EM | Admit: 2019-11-25 | Discharge: 2019-11-29 | DRG: 871 | Disposition: A | Discharge status: Home Health | Payer: 59 | Source: Skilled Nursing Facility | Attending: Hospitalist | Admitting: Hospitalist

## 2019-11-25 ENCOUNTER — Other Ambulatory Visit: Payer: Self-pay

## 2019-11-25 ENCOUNTER — Emergency Department: Payer: 59

## 2019-11-25 ENCOUNTER — Encounter: Payer: Self-pay | Admitting: Emergency Medicine

## 2019-11-25 DIAGNOSIS — Z79899 Other long term (current) drug therapy: Secondary | ICD-10-CM | POA: Diagnosis not present

## 2019-11-25 DIAGNOSIS — E785 Hyperlipidemia, unspecified: Secondary | ICD-10-CM | POA: Diagnosis present

## 2019-11-25 DIAGNOSIS — J159 Unspecified bacterial pneumonia: Secondary | ICD-10-CM | POA: Diagnosis present

## 2019-11-25 DIAGNOSIS — I129 Hypertensive chronic kidney disease with stage 1 through stage 4 chronic kidney disease, or unspecified chronic kidney disease: Secondary | ICD-10-CM | POA: Diagnosis present

## 2019-11-25 DIAGNOSIS — N400 Enlarged prostate without lower urinary tract symptoms: Secondary | ICD-10-CM

## 2019-11-25 DIAGNOSIS — Z79890 Hormone replacement therapy (postmenopausal): Secondary | ICD-10-CM | POA: Diagnosis not present

## 2019-11-25 DIAGNOSIS — A419 Sepsis, unspecified organism: Principal | ICD-10-CM

## 2019-11-25 DIAGNOSIS — G8929 Other chronic pain: Secondary | ICD-10-CM | POA: Diagnosis not present

## 2019-11-25 DIAGNOSIS — J4550 Severe persistent asthma, uncomplicated: Secondary | ICD-10-CM | POA: Diagnosis present

## 2019-11-25 DIAGNOSIS — Z20822 Contact with and (suspected) exposure to covid-19: Secondary | ICD-10-CM

## 2019-11-25 DIAGNOSIS — E039 Hypothyroidism, unspecified: Secondary | ICD-10-CM | POA: Diagnosis present

## 2019-11-25 DIAGNOSIS — J9601 Acute respiratory failure with hypoxia: Secondary | ICD-10-CM | POA: Diagnosis present

## 2019-11-25 DIAGNOSIS — H04123 Dry eye syndrome of bilateral lacrimal glands: Secondary | ICD-10-CM | POA: Diagnosis present

## 2019-11-25 DIAGNOSIS — R251 Tremor, unspecified: Secondary | ICD-10-CM | POA: Diagnosis present

## 2019-11-25 DIAGNOSIS — Z7982 Long term (current) use of aspirin: Secondary | ICD-10-CM | POA: Diagnosis not present

## 2019-11-25 DIAGNOSIS — Z882 Allergy status to sulfonamides status: Secondary | ICD-10-CM

## 2019-11-25 DIAGNOSIS — G4733 Obstructive sleep apnea (adult) (pediatric): Secondary | ICD-10-CM | POA: Diagnosis present

## 2019-11-25 DIAGNOSIS — N179 Acute kidney failure, unspecified: Secondary | ICD-10-CM | POA: Diagnosis present

## 2019-11-25 DIAGNOSIS — D75839 Thrombocytosis, unspecified: Secondary | ICD-10-CM

## 2019-11-25 DIAGNOSIS — R17 Unspecified jaundice: Secondary | ICD-10-CM

## 2019-11-25 DIAGNOSIS — R519 Headache, unspecified: Secondary | ICD-10-CM | POA: Diagnosis present

## 2019-11-25 DIAGNOSIS — D473 Essential (hemorrhagic) thrombocythemia: Secondary | ICD-10-CM | POA: Diagnosis not present

## 2019-11-25 DIAGNOSIS — J189 Pneumonia, unspecified organism: Secondary | ICD-10-CM | POA: Diagnosis not present

## 2019-11-25 DIAGNOSIS — R531 Weakness: Secondary | ICD-10-CM | POA: Diagnosis not present

## 2019-11-25 DIAGNOSIS — N1832 Chronic kidney disease, stage 3b: Secondary | ICD-10-CM | POA: Diagnosis present

## 2019-11-25 DIAGNOSIS — J440 Chronic obstructive pulmonary disease with acute lower respiratory infection: Secondary | ICD-10-CM | POA: Diagnosis present

## 2019-11-25 DIAGNOSIS — N183 Chronic kidney disease, stage 3 unspecified: Secondary | ICD-10-CM

## 2019-11-25 DIAGNOSIS — Z7951 Long term (current) use of inhaled steroids: Secondary | ICD-10-CM

## 2019-11-25 HISTORY — DX: Unspecified bacterial pneumonia: J15.9

## 2019-11-25 HISTORY — DX: Sepsis, unspecified organism: A41.9

## 2019-11-25 LAB — COMPREHENSIVE METABOLIC PANEL
ALT: 21 U/L (ref 0–44)
AST: 27 U/L (ref 15–41)
Albumin: 2.9 g/dL — ABNORMAL LOW (ref 3.5–5.0)
Alkaline Phosphatase: 56 U/L (ref 38–126)
Anion gap: 13 (ref 5–15)
BUN: 23 mg/dL (ref 8–23)
CO2: 23 mmol/L (ref 22–32)
Calcium: 9.2 mg/dL (ref 8.9–10.3)
Chloride: 99 mmol/L (ref 98–111)
Creatinine, Ser: 1.76 mg/dL — ABNORMAL HIGH (ref 0.61–1.24)
GFR calc Af Amer: 43 mL/min — ABNORMAL LOW (ref >60)
GFR calc non Af Amer: 37 mL/min — ABNORMAL LOW (ref >60)
Glucose, Bld: 132 mg/dL — ABNORMAL HIGH (ref 70–99)
Potassium: 4.2 mmol/L (ref 3.5–5.1)
Sodium: 135 mmol/L (ref 135–145)
Total Bilirubin: 1.7 mg/dL — ABNORMAL HIGH (ref 0.3–1.2)
Total Protein: 7.8 g/dL (ref 6.5–8.1)

## 2019-11-25 LAB — CBC WITH DIFFERENTIAL/PLATELET
Abs Immature Granulocytes: 0.61 10*3/uL — ABNORMAL HIGH (ref 0.00–0.07)
Basophils Absolute: 0.1 10*3/uL (ref 0.0–0.1)
Basophils Relative: 0 %
Eosinophils Absolute: 0.1 10*3/uL (ref 0.0–0.5)
Eosinophils Relative: 1 %
HCT: 42.5 % (ref 39.0–52.0)
Hemoglobin: 14.2 g/dL (ref 13.0–17.0)
Immature Granulocytes: 3 %
Lymphocytes Relative: 4 %
Lymphs Abs: 0.7 10*3/uL (ref 0.7–4.0)
MCH: 29.5 pg (ref 26.0–34.0)
MCHC: 33.4 g/dL (ref 30.0–36.0)
MCV: 88.2 fL (ref 80.0–100.0)
Monocytes Absolute: 1.3 10*3/uL — ABNORMAL HIGH (ref 0.1–1.0)
Monocytes Relative: 7 %
Neutro Abs: 15.6 10*3/uL — ABNORMAL HIGH (ref 1.7–7.7)
Neutrophils Relative %: 85 %
Platelets: 475 10*3/uL — ABNORMAL HIGH (ref 150–400)
RBC: 4.82 MIL/uL (ref 4.22–5.81)
RDW: 14.5 % (ref 11.5–15.5)
WBC: 18.4 10*3/uL — ABNORMAL HIGH (ref 4.0–10.5)
nRBC: 0 % (ref 0.0–0.2)

## 2019-11-25 LAB — TROPONIN I (HIGH SENSITIVITY): Troponin I (High Sensitivity): 5 ng/L (ref <18)

## 2019-11-25 LAB — RESPIRATORY PANEL BY RT PCR (FLU A&B, COVID)
Influenza A by PCR: NEGATIVE
Influenza B by PCR: NEGATIVE
SARS Coronavirus 2 by RT PCR: NEGATIVE

## 2019-11-25 LAB — LACTIC ACID, PLASMA: Lactic Acid, Venous: 1 mmol/L (ref 0.5–1.9)

## 2019-11-25 MED ORDER — GABAPENTIN 100 MG PO CAPS
200.0000 mg | ORAL_CAPSULE | Freq: Every day | ORAL | Status: DC
  Administered 2019-11-26 (×2): 200 mg via ORAL
  Filled 2019-11-25 (×3): qty 2

## 2019-11-25 MED ORDER — LOSARTAN POTASSIUM 25 MG PO TABS
25.0000 mg | ORAL_TABLET | Freq: Every day | ORAL | Status: DC
  Administered 2019-11-26 – 2019-11-29 (×4): 25 mg via ORAL
  Filled 2019-11-25 (×4): qty 1

## 2019-11-25 MED ORDER — TAMSULOSIN HCL 0.4 MG PO CAPS
0.4000 mg | ORAL_CAPSULE | Freq: Every day | ORAL | Status: DC
  Administered 2019-11-25 – 2019-11-28 (×4): 0.4 mg via ORAL
  Filled 2019-11-25 (×4): qty 1

## 2019-11-25 MED ORDER — ASPIRIN EC 81 MG PO TBEC
81.0000 mg | DELAYED_RELEASE_TABLET | Freq: Every day | ORAL | Status: DC
  Administered 2019-11-25 – 2019-11-28 (×4): 81 mg via ORAL
  Filled 2019-11-25 (×4): qty 1

## 2019-11-25 MED ORDER — SODIUM CHLORIDE 0.9 % IV SOLN
500.0000 mg | INTRAVENOUS | Status: DC
  Administered 2019-11-25 – 2019-11-26 (×2): 500 mg via INTRAVENOUS
  Filled 2019-11-25 (×3): qty 500

## 2019-11-25 MED ORDER — LEVOTHYROXINE SODIUM 50 MCG PO TABS
50.0000 ug | ORAL_TABLET | Freq: Every day | ORAL | Status: DC
  Administered 2019-11-26 – 2019-11-29 (×4): 50 ug via ORAL
  Filled 2019-11-25 (×4): qty 1

## 2019-11-25 MED ORDER — ENOXAPARIN SODIUM 40 MG/0.4ML ~~LOC~~ SOLN
40.0000 mg | SUBCUTANEOUS | Status: DC
  Administered 2019-11-25 – 2019-11-28 (×3): 40 mg via SUBCUTANEOUS
  Filled 2019-11-25 (×4): qty 0.4

## 2019-11-25 MED ORDER — SODIUM CHLORIDE 0.9 % IV BOLUS
500.0000 mL | Freq: Once | INTRAVENOUS | Status: AC
  Administered 2019-11-25: 21:00:00 500 mL via INTRAVENOUS

## 2019-11-25 MED ORDER — NORTRIPTYLINE HCL 25 MG PO CAPS
150.0000 mg | ORAL_CAPSULE | Freq: Every day | ORAL | Status: DC
  Administered 2019-11-26 – 2019-11-29 (×4): 150 mg via ORAL
  Filled 2019-11-25 (×4): qty 6

## 2019-11-25 MED ORDER — MIRABEGRON ER 50 MG PO TB24
50.0000 mg | ORAL_TABLET | Freq: Every day | ORAL | Status: DC
  Administered 2019-11-26 – 2019-11-29 (×4): 50 mg via ORAL
  Filled 2019-11-25 (×4): qty 1

## 2019-11-25 MED ORDER — SODIUM CHLORIDE 0.9 % IV SOLN
2.0000 g | INTRAVENOUS | Status: DC
  Administered 2019-11-25: 21:00:00 2 g via INTRAVENOUS
  Filled 2019-11-25: qty 20

## 2019-11-25 MED ORDER — MONTELUKAST SODIUM 10 MG PO TABS
5.0000 mg | ORAL_TABLET | Freq: Every day | ORAL | Status: DC
  Administered 2019-11-26 – 2019-11-28 (×4): 5 mg via ORAL
  Filled 2019-11-25 (×2): qty 1
  Filled 2019-11-25: qty 0.5
  Filled 2019-11-25: qty 1
  Filled 2019-11-25: qty 0.5

## 2019-11-25 MED ORDER — GABAPENTIN 600 MG PO TABS
600.0000 mg | ORAL_TABLET | Freq: Every day | ORAL | Status: DC
  Administered 2019-11-25: 23:00:00 600 mg via ORAL
  Filled 2019-11-25 (×3): qty 1

## 2019-11-25 MED ORDER — SODIUM CHLORIDE 0.9 % IV SOLN
2.0000 g | INTRAVENOUS | Status: DC
  Administered 2019-11-25 – 2019-11-27 (×3): 2 g via INTRAVENOUS
  Filled 2019-11-25: qty 20
  Filled 2019-11-25 (×2): qty 2

## 2019-11-25 MED ORDER — MOMETASONE FURO-FORMOTEROL FUM 200-5 MCG/ACT IN AERO
2.0000 | INHALATION_SPRAY | Freq: Two times a day (BID) | RESPIRATORY_TRACT | Status: DC
  Administered 2019-11-26 – 2019-11-29 (×8): 2 via RESPIRATORY_TRACT
  Filled 2019-11-25: qty 8.8

## 2019-11-25 MED ORDER — ACETAMINOPHEN 500 MG PO TABS: 500.0000 mg | ORAL_TABLET | ORAL | Status: DC | PRN

## 2019-11-25 MED ORDER — FENOFIBRATE 160 MG PO TABS
160.0000 mg | ORAL_TABLET | Freq: Every day | ORAL | Status: DC
  Administered 2019-11-27 – 2019-11-29 (×3): 160 mg via ORAL
  Filled 2019-11-25 (×4): qty 1

## 2019-11-25 NOTE — H&P (Signed)
History and Physical    Dakota Harper [id number] DOB: [birth year] DOA: 11/25/2019  [facility]P: [facility]  Patient coming from: [facility]  I have personally briefly reviewed patient's old medical records in [place]  Chief Complaint: cough and wheezing  HPI: Dakota Harper is a 74 y.o. male with medical history significant of severe persistent asthma, chronic headaches, insomnia, tremors, OSA, BPH who presents with concerns of increase cough and wheezing.  Patient reportedly endorse that he was positive for COVID about 2 weeks ago and since then has had at least 3 falls due to weakness and has been having left sided rib pain. He also noted increase non-productive cough. No fever. He saw a doctor today and was sent to ED for concerns of pneumonia.  Per facility he actually tested negative for COVID last week but no physical copy of test here in the ED. POC COVID test is pending.   Denies tobacco, alcohol or illicit drug use.  ED Course: He was afebrile, tachycardic, tachynpeic at 92% oxygen saturation on room air.  WBC of 18.4. CXR showed left lower lobe pneumonia.   He received 500cc bolus and was started on Rocephin and Azithromycin.   Review of Systems:  Constitutional: No Weight Change, No Fever ENT/Mouth: No sore throat, No Rhinorrhea Eyes: Vision Changes Cardiovascular: N+o Chest Pain, +SOB Respiratory: +Cough, No Sputum, Gastrointestinal: No Nausea, No Vomiting, No Diarrhea, No Constipation, No Pain Genitourinary: no Urinary Incontinence Musculoskeletal: No Arthralgias, No Myalgias Skin: No Skin Lesions, No Pruritus, Neuro: + Weakness, No Numbness,  No Loss of Consciousness, No Syncope Psych: no decrease appetite Heme/Lymph: No Bruising, No Bleeding  Past Medical History:  Diagnosis Date  . Asthma   . BPH (benign prostatic hyperplasia)   . Ear pain   . Hypercholesteremia   . Hypertension   . Neuropathy   . Sleep disorder     Past Surgical  History:  Procedure Laterality Date  . CHOLECYSTECTOMY    . HERNIA REPAIR       reports that he has never smoked. He has never used smokeless tobacco. He reports that he does not drink alcohol or use drugs.  Allergies  Allergen Reactions  . Saw Palmetto Hives and Rash  . Sulfa Antibiotics Rash  . Sulfacetamide Sodium Rash    Family History  Problem Relation Age of Onset  . Prostate cancer Neg Hx   . Bladder Cancer Neg Hx   . Kidney cancer Neg Hx      Prior to Admission medications   Medication Sig Start Date End Date Taking? Authorizing Provider  acetaminophen (TYLENOL) 325 MG tablet Take 650 mg by mouth every 6 (six) hours as needed for pain.    [provider]  albuterol (PROVENTIL HFA;VENTOLIN HFA) 108 (90 BASE) MCG/ACT inhaler Inhale 1 puff into the lungs every 6 (six) hours as needed for wheezing.    [provider]  aspirin EC 81 MG tablet Take 81 mg by mouth daily.    [provider]  benzonatate (TESSALON PERLES) 100 MG capsule Take 2 capsules (200 mg total) by mouth 3 (three) times daily. 05/01/19 04/30/20  Laverle Hobby, MD  clonazePAM (KLONOPIN) 1 MG tablet Take 1 mg 2 (two) times daily by mouth.    [provider]  DULoxetine (CYMBALTA) 60 MG capsule Take 60 mg by mouth daily.    [provider]  fenofibrate (TRICOR) 145 MG tablet Take 1 tablet by mouth daily. 02/03/18   [provider]  fluocinonide ointment (LIDEX) 0.05 %  03/13/18   [provider]  Fluticasone Propionate  HFA (FLOVENT HFA IN) Inhale 2 sprays into the lungs 2 (two) times daily.    [provider]  gabapentin (NEURONTIN) 100 MG capsule Take 100 mg twice a day for one week, then increase to 200 mg(2 tablets) twice a day and continue 03/22/18   [provider]  guaiFENesin (MUCINEX) 600 MG 12 hr tablet Take 2 (two) times daily by mouth.    [provider]  HYDROcodone-acetaminophen (NORCO/VICODIN) 5-325 MG  tablet Take 1 tablet by mouth every 6 (six) hours as needed for moderate pain. 11/11/19   Johnn Hai, PA-C  levothyroxine (SYNTHROID, LEVOTHROID) 50 MCG tablet Take 50 mcg daily before breakfast by mouth.    [provider]  lisinopril (PRINIVIL,ZESTRIL) 20 MG tablet Take 1 tablet by mouth daily. 03/05/18   [provider]  loratadine (CLARITIN) 10 MG tablet Take 10 mg daily by mouth.    [provider]  mirabegron ER (MYRBETRIQ) 50 MG TB24 tablet Take 1 tablet (50 mg total) daily by mouth. 09/17/17   MacDiarmid, Nicki Reaper, MD  Multiple Vitamin (MULTI-VITAMINS) TABS Take by mouth.    [provider]  nortriptyline (PAMELOR) 75 MG capsule  04/04/18   [provider]  PRESCRIPTION MEDICATION Apply 1 application topically 2 (two) times daily as needed (dry skin). triamcinolone 1%  Menthol and camfer 1%    [provider]  tamsulosin (FLOMAX) 0.4 MG CAPS capsule Take 1 capsule (0.4 mg total) by mouth daily. 04/02/16   Schuyler Amor, MD  zolpidem (AMBIEN) 5 MG tablet Take 1 tablet (5 mg total) by mouth at bedtime as needed for sleep. 10/02/15   Joanne Gavel, MD    Physical Exam: Vitals:   11/25/19 1709 11/25/19 1710  BP: 102/64   Pulse: (!) 113   Resp: 20   Temp: 98.5 F (36.9 C)   TempSrc: Oral   SpO2: 94%   Weight:  95.3 kg  Height:  5\' 9"  (1.753 m)    Constitutional: NAD, calm, comfortable, nontoxic appearing male laying flat in bed Vitals:   11/25/19 1709 11/25/19 1710  BP: 102/64   Pulse: (!) 113   Resp: 20   Temp: 98.5 F (36.9 C)   TempSrc: Oral   SpO2: 94%   Weight:  95.3 kg  Height:  5\' 9"  (1.753 m)   Eyes: PERRL, lids and conjunctivae normal ENMT: Mucous membranes are moist.  Poor dentition. Neck: normal, supple Respiratory: Bibasilar crackles and crackles to left upper lobe with no wheezing, tachypneic without any use of accessory muscle on room air Cardiovascular: Regular rate and rhythm, no murmurs / rubs /  gallops. No extremity edema. 2+ pedal pulses. No carotid bruits.  Abdomen: Pain to palpation of the left lateral lower rib cage, no masses palpated.  Bowel sounds positive.  Musculoskeletal: no clubbing / cyanosis. No joint deformity upper and lower extremities. Good ROM, no contractures. Normal muscle tone.  Skin: no rashes, lesions, ulcers. No induration Neurologic: CN 2-12 grossly intact. Sensation intact. Strength 4/5 in lower extremity. Psychiatric: Normal judgment and insight. Alert and oriented x 3. Normal mood.     Labs on Admission: I have personally reviewed following labs and imaging studies  CBC: Recent Labs  Lab 11/25/19 1716  WBC 18.4*  NEUTROABS 15.6*  HGB 14.2  HCT 42.5  MCV 88.2  PLT 123XX123*   Basic Metabolic Panel: Recent Labs  Lab 11/25/19 1716  NA 135  K 4.2  CL 99  CO2 23  GLUCOSE 132*  BUN 23  CREATININE 1.76*  CALCIUM 9.2   GFR: Estimated Creatinine Clearance: 41.9 mL/min (A) (by C-G formula based on SCr of 1.76 mg/dL (H)). Liver Function Tests: Recent Labs  Lab 11/25/19 1716  AST 27  ALT 21  ALKPHOS 56  BILITOT 1.7*  PROT 7.8  ALBUMIN 2.9*   No results for input(s): LIPASE, AMYLASE in the last 168 hours. No results for input(s): AMMONIA in the last 168 hours. Coagulation Profile: No results for input(s): INR, PROTIME in the last 168 hours. Cardiac Enzymes: No results for input(s): CKTOTAL, CKMB, CKMBINDEX, TROPONINI in the last 168 hours. BNP (last 3 results) No results for input(s): PROBNP in the last 8760 hours. HbA1C: No results for input(s): HGBA1C in the last 72 hours. CBG: No results for input(s): GLUCAP in the last 168 hours. Lipid Profile: No results for input(s): CHOL, HDL, LDLCALC, TRIG, CHOLHDL, LDLDIRECT in the last 72 hours. Thyroid Function Tests: No results for input(s): TSH, T4TOTAL, FREET4, T3FREE, THYROIDAB in the last 72 hours. Anemia Panel: No results for input(s): VITAMINB12, FOLATE, FERRITIN, TIBC, IRON,  RETICCTPCT in the last 72 hours. Urine analysis:    Component Value Date/Time   COLORURINE YELLOW (A) 04/02/2016 1522   APPEARANCEUR Clear 09/17/2017 0905   LABSPEC 1.021 04/02/2016 1522   PHURINE 5.0 04/02/2016 1522   GLUCOSEU Negative 09/17/2017 0905   HGBUR 2+ (A) 04/02/2016 1522   BILIRUBINUR Negative 09/17/2017 0905   KETONESUR NEGATIVE 04/02/2016 1522   PROTEINUR 2+ (A) 09/17/2017 0905   PROTEINUR 30 (A) 04/02/2016 1522   NITRITE Negative 09/17/2017 0905   NITRITE NEGATIVE 04/02/2016 1522   LEUKOCYTESUR Negative 09/17/2017 0905    Radiological Exams on Admission: DG Chest 2 View  Result Date: 11/25/2019 CLINICAL DATA:  74 year old male with shortness of breath and cough EXAM: CHEST - 2 VIEW COMPARISON:  None FINDINGS: Left lower lobe patchy opacity most consistent with pneumonia. Clinical correlation and follow-up recommended. A small left pleural effusion may be present. There is no pneumothorax. The cardiac silhouette is within normal limits. Atherosclerotic calcification of the aorta. No acute osseous pathology. IMPRESSION: Left lower lobe pneumonia. Clinical correlation and follow-up recommended. Electronically Signed   By: Anner Crete M.D.   On: 11/25/2019 17:46    EKG: Independently reviewed.   Assessment/Plan Sepsis secondary to left lower lobe community acquired pneumonia Admit as PUI pending repeat Covid test.  Although suspect this symptom is from bacterial pneumonia. 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 Baseline unknown Follow with fluids Avoid nephrotoxic agents   Hypothyroidism Continue levothyroxine  BPH Continue Flomax   Chronic headache Continue nortriptyline  Tremors Continue gabapentin  DVT prophylaxis:.Lovenox Code  Status: Full Family Communication: Plan discussed with patient at bedside  disposition Plan: SNF with at least 2 midnight stays  Consults called:  Admission status: inpatient    Iyonnah Ferrante T Darnella Zeiter DO [facility]   If 7PM-7AM, please contact night-coverage [email or web address]   11/25/2019, 8:54 PM

## 2019-11-25 NOTE — ED Provider Notes (Signed)
[facility] Emergency Department Provider Note   ____________________________________________   First MD Initiated Contact with Patient 11/25/19 2005     (approximate)  I have reviewed the triage vital signs and the nursing notes.   HISTORY  Chief Complaint Pneumonia    HPI Dakota Harper is a 74 y.o. male history of hypertension, COPD  Patient reports that his doctor referred him here to get an x-ray today that showed pneumonia.  He has cough, shortness of breath but does also have elements of chronic cough.  He has been feeling a little more short of breath and having aching and discomfort over the left side of the chest for several days  He reports verbally that he tested positive for coronavirus about 2 weeks ago, but is since tested negative while living at the [facility].  However, he does not have any accompanying documentation but does report that his test came back positive through "do[facility] making housecall[facility]" out of [place] and he reports it would be fine for [place] to contact them to fax over his information on his test  He reports chronic cough and congestion.  Reports this is due to his COPD.  Came here for evaluation and concern of his doctor who told him he had pneumonia today   Past Medical History:  Diagnosis Date  . Asthma   . BPH (benign prostatic hyperplasia)   . Ear pain   . Hypercholesteremia   . Hypertension   . Neuropathy   . Sleep disorder     There are no problems to display for this patient.   Past Surgical History:  Procedure Laterality Date  . CHOLECYSTECTOMY    . HERNIA REPAIR      Prior to Admission medications   Medication Sig Start Date End Date Taking? Authorizing Provider  acetaminophen (TYLENOL) 325 MG tablet Take 650 mg by mouth every 6 (six) hours as needed for pain.    [provider]  albuterol (PROVENTIL HFA;VENTOLIN HFA) 108 (90 BASE) MCG/ACT inhaler Inhale 1 puff into the lungs every 6 (six) hours as  needed for wheezing.    [provider]  aspirin EC 81 MG tablet Take 81 mg by mouth daily.    [provider]  benzonatate (TESSALON PERLES) 100 MG capsule Take 2 capsules (200 mg total) by mouth 3 (three) times daily. 05/01/19 04/30/20  Laverle Hobby, MD  clonazePAM (KLONOPIN) 1 MG tablet Take 1 mg 2 (two) times daily by mouth.    [provider]  DULoxetine (CYMBALTA) 60 MG capsule Take 60 mg by mouth daily.    [provider]  fenofibrate (TRICOR) 145 MG tablet Take 1 tablet by mouth daily. 02/03/18   [provider]  fluocinonide ointment (LIDEX) 0.05 %  03/13/18   [provider]  Fluticasone Propionate  HFA (FLOVENT HFA IN) Inhale 2 sprays into the lungs 2 (two) times daily.    [provider]  gabapentin (NEURONTIN) 100 MG capsule Take 100 mg twice a day for one week, then increase to 200 mg(2 tablets) twice a day and continue 03/22/18   [provider]  guaiFENesin (MUCINEX) 600 MG 12 hr tablet Take 2 (two) times daily by mouth.    [provider]  HYDROcodone-acetaminophen (NORCO/VICODIN) 5-325 MG tablet Take 1 tablet by mouth every 6 (six) hours as needed for moderate pain. 11/11/19   Johnn Hai, PA-C  levothyroxine (SYNTHROID, LEVOTHROID) 50 MCG tablet Take 50 mcg daily before breakfast by mouth.  [provider]  lisinopril (PRINIVIL,ZESTRIL) 20 MG tablet Take 1 tablet by mouth daily. 03/05/18   [provider]  loratadine (CLARITIN) 10 MG tablet Take 10 mg daily by mouth.    [provider]  mirabegron ER (MYRBETRIQ) 50 MG TB24 tablet Take 1 tablet (50 mg total) daily by mouth. 09/17/17   MacDiarmid, Nicki Reaper, MD  Multiple Vitamin (MULTI-VITAMINS) TABS Take by mouth.    [provider]  nortriptyline (PAMELOR) 75 MG capsule  04/04/18   [provider]  PRESCRIPTION MEDICATION Apply 1 application topically 2 (two) times daily as needed (dry skin).  triamcinolone 1%  Menthol and camfer 1%    [provider]  tamsulosin (FLOMAX) 0.4 MG CAPS capsule Take 1 capsule (0.4 mg total) by mouth daily. 04/02/16   Schuyler Amor, MD  zolpidem (AMBIEN) 5 MG tablet Take 1 tablet (5 mg total) by mouth at bedtime as needed for sleep. 10/02/15   Joanne Gavel, MD    Allergies Saw palmetto, Sulfa antibiotics, and Sulfacetamide sodium  Family History  Problem Relation Age of Onset  . Prostate cancer Neg Hx   . Bladder Cancer Neg Hx   . Kidney cancer Neg Hx     Social History Social History   Tobacco Use  . Smoking status: Never Smoker  . Smokeless tobacco: Never Used  Substance Use Topics  . Alcohol use: No  . Drug use: No    Review of Systems Constitutional: No fever/chills Eyes: No visual changes. ENT: No sore throat. Cardiovascular: Aching pain over the left ribs not quite sure how long but is been there a little while. Respiratory: Some shortness of breath.  No wheezing.  Frequent productive cough, reports this is somewhat chronic in nature.  Please see note in HPI above Gastrointestinal: No abdominal pain.   Genitourinary: Negative for dysuria. Musculoskeletal: Negative for back pain. Skin: Negative for rash. Neurological: Negative for headaches, areas of focal weakness or numbness.    ____________________________________________   PHYSICAL EXAM:  VITAL SIGNS: ED Triage Vitals  Enc Vitals Group     BP 11/25/19 1709 102/64     Pulse Rate 11/25/19 1709 (!) 113     Resp 11/25/19 1709 20     Temp 11/25/19 1709 98.5 F (36.9 C)     Temp Source 11/25/19 1709 Oral     SpO2 11/25/19 1709 94 %     Weight 11/25/19 1710 210 lb (95.3 kg)     Height 11/25/19 1710 5\' 9"  (1.753 m)     Head Circumference --      Peak Flow --      Pain Score 11/25/19 1710 6     Pain Loc --      Pain Edu? --      Excl. in [place]? --     Constitutional: Alert and oriented.  Mildly ill-appearing, some appearance of chronic illness but in  no acute distress Eyes: Conjunctivae are normal. Head: Atraumatic. Nose: No congestion/rhinnorhea. Mouth/Throat: Mucous membranes are slightly dry. Neck: No stridor.  Cardiovascular: Slightly tachycardic rate, regular rhythm. Grossly normal heart sounds.  Good peripheral circulation. Respiratory: Normal respiratory effort.  No retractions.  Diminished lung sounds at bases bilaterally, crackles and diminished lung sounds especially present on the left lower lobe. Gastrointestinal: Soft and nontender. No distention. Musculoskeletal: No lower extremity tenderness nor edema. Neurologic:  Normal speech and language. No gross focal neurologic deficits are appreciated.  Skin:  Skin is warm, dry and intact. No rash noted.  Psychiatric: Mood and affect are normal. Speech and behavior are normal.  ____________________________________________   LABS (all labs ordered are listed, but only abnormal results are displayed)  Labs Reviewed  COMPREHENSIVE METABOLIC PANEL - Abnormal; Notable for the following components:      Result Value   Glucose, Bld 132 (*)    Creatinine, Ser 1.76 (*)    Albumin 2.9 (*)    Total Bilirubin 1.7 (*)    GFR calc non Af Amer 37 (*)    GFR calc Af Amer 43 (*)    All other components within normal limits  CBC WITH DIFFERENTIAL/PLATELET - Abnormal; Notable for the following components:   WBC 18.4 (*)    Platelets 475 (*)    Neutro Abs 15.6 (*)    Monocytes Absolute 1.3 (*)    Abs Immature Granulocytes 0.61 (*)    All other components within normal limits  CULTURE, BLOOD (ROUTINE X 2)  CULTURE, BLOOD (ROUTINE X 2)  RESPIRATORY PANEL BY RT PCR (FLU A&B, COVID)  LACTIC ACID, PLASMA  TROPONIN I (HIGH SENSITIVITY)   ____________________________________________  EKG  Reviewed enterotomy at 1715 Heart rate 110 QRs 160 QT 400 Sinus tachycardia, right bundle branch block.  No evidence acute ischemia  ____________________________________________  RADIOLOGY  DG  Chest 2 View  Result Date: 11/25/2019 CLINICAL DATA:  74 year old male with shortness of breath and cough EXAM: CHEST - 2 VIEW COMPARISON:  None FINDINGS: Left lower lobe patchy opacity most consistent with pneumonia. Clinical correlation and follow-up recommended. A small left pleural effusion may be present. There is no pneumothorax. The cardiac silhouette is within normal limits. Atherosclerotic calcification of the aorta. No acute osseous pathology. IMPRESSION: Left lower lobe pneumonia. Clinical correlation and follow-up recommended. Electronically Signed   By: Anner Crete M.D.   On: 11/25/2019 17:46    Imaging reviewed, personally viewed, consolidation of left lower lobe small pleural effusion ____________________________________________   PROCEDURES  Procedure(s) performed: None  Procedures  Critical Care performed: Yes, see critical care note(s)  CRITICAL CARE Performed by: Delman Kitten   Total critical care time: 30 minutes  Critical care time was exclusive of separately billable procedures and treating other patients.  Critical care was necessary to treat or prevent imminent or life-threatening deterioration.  Critical care was time spent personally by me on the following activities: development of treatment plan with patient and/or surrogate as well as nursing, discussions with consultants, evaluation of patient's response to treatment, examination of patient, obtaining history from patient or surrogate, ordering and performing treatments and interventions, ordering and review of laboratory studies, ordering and review of radiographic studies, pulse oximetry and re-evaluation of patient's condition.  ____________________________________________   INITIAL IMPRESSION / ASSESSMENT AND PLAN / ED COURSE  Pertinent labs & imaging results that were available during my care of the patient were reviewed by me and considered in my medical decision making (see chart for  details).   Patient presents with cough, leukocytosis, tachycardia and infiltrative pattern in the left lower lobe.  Symptoms seem highly suggestive of a community-acquired pneumonia.  He denies recent hospitalization.  However, the patient does report that he tested positive for Covid 2 weeks ago, but since tested negative.  As a little unclear to me, work [employer] house calls and hopes to retrieve his COVID-19 testing and see if he did affirm a positive test.  There is no clear diminishment in symptoms or interval improvement however, and thus it makes it hard to know if  the patient still has active COVID-19 or if he was actually ever diagnosed as a positive.  Will obtain a repeat [id number] test, and certainly maintain appropriate precautions as we continue to delineate this  Nonetheless his imaging studies and clinical history seem to suggest community-acquired pneumonia we will start on appropriate antibiotics, obtain blood cultures and initiate fluids as he appears dehydrated as well.  He does meet sepsis criteria.  No evidence of severe or septic shock tender at this time.  Other etiologies for consolidation would certainly include tumor, ongoing infection, COVID-19 etc.  Patient will be monitored closely in the hospital  Hospitalist paged, discussed case with Dr. Flossie Buffy, also discussed his COVID test and that we are waiting on 2 hour PCR test and fax of his records of COVID testing from the [facility]  Patient understanding of plan agreeable with admission    Denies acute cardiac symptoms.  No acute neurologic symptoms currently afebrile.  Awake alert and oriented without oxygen deficit noted at this time  ____________________________________________   FINAL CLINICAL IMPRESSION(S) / ED DIAGNOSES  Final diagnoses:  COVID-19 virus test result unknown  Community acquired pneumonia of left lower lobe of lung  Sepsis, due to unspecified organism, unspecified whether acute organ dysfunction  present [facility])        Note:  This document was prepared using Dragon voice recognition software and may include unintentional dictation errors       Delman Kitten, MD 11/25/19 2049

## 2019-11-25 NOTE — ED Notes (Signed)
Pt complaining of ongoing shortness of breath, oxygen saturation WDL.  Pt placed on 2L nasal cannula for comfort measures.

## 2019-11-25 NOTE — ED Triage Notes (Signed)
Pt in via [facility] from the [facility], reports PCP advised to be evaluated here due to seeing PNA on recent chest imaging.  Pt reports cough, shortness of breath x approximately one week.  NAD noted at this time.

## 2019-11-25 NOTE — ED Triage Notes (Signed)
FIRST NURSE NOTE- from the [facility].  Per nursing facility sats were in 80s but were 96% RA with EMS.  Nursing staff still wanted pt evaluated. Pt has not complaints.

## 2019-11-25 NOTE — ED Notes (Signed)
Pt given apple juice, graham crackers, and peanut butter.  

## 2019-11-25 NOTE — ED Notes (Addendum)
Yellow High Fall Risk slip-resistant socks applied and yellow High Fall Risk bracelet placed on pt's right wrist at this time.

## 2019-11-25 NOTE — Progress Notes (Signed)
CODE SEPSIS - PHARMACY COMMUNICATION  **Broad Spectrum Antibiotics should be administered within 1 hour of Sepsis diagnosis**  Time Code Sepsis Called/Page Received:  1/19 @ 2007   Antibiotics Ordered: Ceftriaxone , Azithromycin   Time of 1st antibiotic administration:  Ceftriaxone 1/19 @ 2049   Additional action taken by pharmacy:   If necessary, Name of Provider/Nurse Contacted:     Jody Aguinaga D ,PharmD Clinical Pharmacist  11/25/2019  9:48 PM

## 2019-11-26 DIAGNOSIS — J189 Pneumonia, unspecified organism: Secondary | ICD-10-CM | POA: Diagnosis present

## 2019-11-26 DIAGNOSIS — J159 Unspecified bacterial pneumonia: Secondary | ICD-10-CM

## 2019-11-26 LAB — CBC
HCT: 36.1 % — ABNORMAL LOW (ref 39.0–52.0)
Hemoglobin: 12.2 g/dL — ABNORMAL LOW (ref 13.0–17.0)
MCH: 29.7 pg (ref 26.0–34.0)
MCHC: 33.8 g/dL (ref 30.0–36.0)
MCV: 87.8 fL (ref 80.0–100.0)
Platelets: 381 10*3/uL (ref 150–400)
RBC: 4.11 MIL/uL — ABNORMAL LOW (ref 4.22–5.81)
RDW: 14.5 % (ref 11.5–15.5)
WBC: 17.1 10*3/uL — ABNORMAL HIGH (ref 4.0–10.5)
nRBC: 0 % (ref 0.0–0.2)

## 2019-11-26 LAB — MRSA PCR SCREENING: MRSA by PCR: POSITIVE — AB

## 2019-11-26 LAB — BASIC METABOLIC PANEL
Anion gap: 10 (ref 5–15)
BUN: 23 mg/dL (ref 8–23)
CO2: 22 mmol/L (ref 22–32)
Calcium: 8.2 mg/dL — ABNORMAL LOW (ref 8.9–10.3)
Chloride: 104 mmol/L (ref 98–111)
Creatinine, Ser: 1.55 mg/dL — ABNORMAL HIGH (ref 0.61–1.24)
GFR calc Af Amer: 50 mL/min — ABNORMAL LOW (ref >60)
GFR calc non Af Amer: 43 mL/min — ABNORMAL LOW (ref >60)
Glucose, Bld: 102 mg/dL — ABNORMAL HIGH (ref 70–99)
Potassium: 4.3 mmol/L (ref 3.5–5.1)
Sodium: 136 mmol/L (ref 135–145)

## 2019-11-26 LAB — HIV ANTIBODY (ROUTINE TESTING W REFLEX): HIV Screen 4th Generation wRfx: NONREACTIVE

## 2019-11-26 LAB — PROCALCITONIN: Procalcitonin: 0.95 ng/mL

## 2019-11-26 MED ORDER — BENZONATATE 100 MG PO CAPS
200.0000 mg | ORAL_CAPSULE | Freq: Three times a day (TID) | ORAL | Status: DC
  Administered 2019-11-26 – 2019-11-29 (×9): 200 mg via ORAL
  Filled 2019-11-26 (×9): qty 2

## 2019-11-26 MED ORDER — MUPIROCIN 2 % EX OINT
1.0000 "application " | TOPICAL_OINTMENT | Freq: Two times a day (BID) | CUTANEOUS | Status: DC
  Administered 2019-11-27 – 2019-11-29 (×5): 1 via NASAL
  Filled 2019-11-26: qty 22

## 2019-11-26 MED ORDER — CHLORHEXIDINE GLUCONATE CLOTH 2 % EX PADS
6.0000 | MEDICATED_PAD | Freq: Every day | CUTANEOUS | Status: DC
  Administered 2019-11-28 – 2019-11-29 (×2): 6 via TOPICAL

## 2019-11-26 MED ORDER — IPRATROPIUM-ALBUTEROL 0.5-2.5 (3) MG/3ML IN SOLN
3.0000 mL | Freq: Four times a day (QID) | RESPIRATORY_TRACT | Status: DC
  Administered 2019-11-26 – 2019-11-27 (×3): 3 mL via RESPIRATORY_TRACT
  Filled 2019-11-26 (×2): qty 3

## 2019-11-26 NOTE — Progress Notes (Signed)
PROGRESS NOTE    Dakota Harper  [id number] DOB: 05-13-1946 DOA: 11/25/2019 [facility]: [facility]    Assessment & Plan:   Principal Problem:   Community acquired bacterial pneumonia Active Problems:   Sepsis ([place])   Weakness   CKD (chronic kidney disease), stage III   Hypothyroidism   BPH (benign prostatic hyperplasia)   Headache   Tremors of nervous system   Thrombocytosis ([facility])   Elevated bilirubin   Pneumonia    Dakota Harper is a 74 y.o. Caucasian male with medical history significant of severe persistent asthma, chronic headaches, insomnia, tremors, OSA, BPH who presents with concerns of increase cough and wheezing.   ED Course: He was afebrile, tachycardic, tachynpeic at 92% oxygen saturation on room air.  WBC of 18.4. CXR showed left lower lobe pneumonia.   He received 500cc bolus and was started on Rocephin and Azithromycin.   # Sepsis 2/2 Left lower lobe community acquired pneumonia # Acute hypoxic respiratory failure 2/2 CAP --COVID neg.  Procal 0.95, suggestive of sepsis.  No significant wheezing. PLAN: --Continue IV Rocephin and azithromycin --Incentive spirometry --Tessalon 200 mg TID scheduled (pt said this is the only cough med that works for him) --DuoNeb QID scheduled  Weakness secondary to pneumonia PT eval  Thrombocytosis  plt of 475 reactive   Elevated total bilirubin reactive   Hypertension  Continue Losartan   CKD 3b Presenting creatinine of 1.75/GFR 37. Baseline unknown Avoid nephrotoxic agents   Hypothyroidism Continue levothyroxine  BPH Continue Flomax   Chronic headache Continue nortriptyline  Tremors Continue gabapentin   DVT prophylaxis: Lovenox SQ Code Status: Full code  Family Communication: Not today Disposition Plan: Home   Subjective and Interval History:  Pt complained of bad coughing spells which cause him chest pain.  No fever, abdominal pain, N/V/D, dysuria, increased swelling.   Objective: Vitals:   11/26/19 1615 11/26/19 1630 11/26/19 1631 11/26/19 1740  BP:   (!) 118/58 121/66  Pulse: 94 93  96  Resp: (!) 24 (!) 22  20  Temp:    97.9 F (36.6 C)  TempSrc:    Oral  SpO2: 96% 97%  95%  Weight:      Height:       No intake or output data in the 24 hours ending 11/26/19 2006 Filed Weights   11/25/19 1710 11/26/19 0720  Weight: 95.3 kg 95.3 kg    Examination:   Constitutional: NAD, AAOx3 HEENT: conjunctivae and lids normal, EOMI CV: RRR no M,R,G. Distal pulses +2.  No cyanosis.   RESP: No wheezes, frequent coughing spells GI: +BS, NTND Extremities: No effusions, edema, or tenderness in BLE SKIN: warm, dry and intact Neuro: II - XII grossly intact.  Sensation intact Psych: Depressed mood and affect.     Data Reviewed: I have personally reviewed following labs and imaging studies  CBC: Recent Labs  Lab 11/25/19 1716 11/26/19 0614  WBC 18.4* 17.1*  NEUTROABS 15.6*  --   HGB 14.2 12.2*  HCT 42.5 36.1*  MCV 88.2 87.8  PLT 475* [id number]   Basic Metabolic Panel: Recent Labs  Lab 11/25/19 1716 11/26/19 0614  NA 135 136  K 4.2 4.3  CL 99 104  CO2 23 22  GLUCOSE 132* 102*  BUN 23 23  CREATININE 1.76* 1.55*  CALCIUM 9.2 8.2*   GFR: Estimated Creatinine Clearance: 47.6 mL/min (A) (by C-G formula based on SCr of 1.55 mg/dL (H)). Liver Function Tests: Recent Labs  Lab 11/25/19 1716  AST 27  ALT 21  ALKPHOS 56  BILITOT 1.7*  PROT 7.8  ALBUMIN 2.9*   No results for input(s): LIPASE, AMYLASE in the last 168 hours. No results for input(s): AMMONIA in the last 168 hours. Coagulation Profile: No results for input(s): INR, PROTIME in the last 168 hours. Cardiac Enzymes: No results for input(s): CKTOTAL, CKMB, CKMBINDEX, TROPONINI in the last 168 hours. BNP (last 3 results) No results for input(s): PROBNP in the last 8760 hours. HbA1C: No results for input(s): HGBA1C in the last 72 hours. CBG: No results for input(s): GLUCAP in the  last 168 hours. Lipid Profile: No results for input(s): CHOL, HDL, LDLCALC, TRIG, CHOLHDL, LDLDIRECT in the last 72 hours. Thyroid Function Tests: No results for input(s): TSH, T4TOTAL, FREET4, T3FREE, THYROIDAB in the last 72 hours. Anemia Panel: No results for input(s): VITAMINB12, FOLATE, FERRITIN, TIBC, IRON, RETICCTPCT in the last 72 hours. Sepsis Labs: Recent Labs  Lab 11/25/19 1716 11/26/19 0614  PROCALCITON  --  0.95  LATICACIDVEN 1.0  --     Recent Results (from the past 240 hour(s))  Blood Culture (routine x 2)     Status: None (Preliminary result)   Collection Time: 11/25/19  8:07 PM   Specimen: BLOOD  Result Value Ref Range Status   Specimen Description BLOOD LEFT ANTECUBITAL  Final   Special Requests   Final    BOTTLES DRAWN AEROBIC AND ANAEROBIC Blood Culture adequate volume   Culture   Final    NO GROWTH < 12 HOURS Performed at [facility], [address], [place], [place] [address]    Report Status PENDING  Incomplete  Blood Culture (routine x 2)     Status: None (Preliminary result)   Collection Time: 11/25/19  8:12 PM   Specimen: BLOOD  Result Value Ref Range Status   Specimen Description BLOOD BLOOD RIGHT WRIST  Final   Special Requests   Final    BOTTLES DRAWN AEROBIC AND ANAEROBIC Blood Culture adequate volume   Culture   Final    NO GROWTH < 12 HOURS Performed at [facility], [address] [place], [place] [address]    Report Status PENDING  Incomplete  Respiratory Panel by RT PCR (Flu A&B, Covid) - Nasopharyngeal Swab     Status: None   Collection Time: 11/25/19  8:50 PM   Specimen: Nasopharyngeal Swab  Result Value Ref Range Status   SARS Coronavirus 2 by RT PCR NEGATIVE NEGATIVE Final    Comment: (NOTE) SARS-CoV-2 target nucleic acids are NOT DETECTED. The SARS-CoV-2 RNA is generally detectable in upper respiratoy specimens during the acute phase of infection. The lowest concentration of SARS-CoV-2 viral copies this  assay can detect is 131 copies/mL. A negative result does not preclude SARS-Cov-2 infection and should not be used as the sole basis for treatment or other patient management decisions. A negative result may occur with  improper specimen collection/handling, submission of specimen other than nasopharyngeal swab, presence of viral mutation(s) within the areas targeted by this assay, and inadequate number of viral copies (<131 copies/mL). A negative result must be combined with clinical observations, patient history, and epidemiological information. The expected result is Negative. Fact Sheet for Patients:  PinkCheek.be Fact Sheet for Healthcare Providers:  GravelBags.it This test is not yet ap proved or cleared by the [place] FDA and  has been authorized for detection and/or diagnosis of SARS-CoV-2 by FDA under an Emergency Use Authorization (EUA). This EUA will remain  in effect (  meaning this test can be used) for the duration of the COVID-19 declaration under Section 564(b)(1) of the Act, 21 U.S.C. section 360bbb-3(b)(1), unless the authorization is terminated or revoked sooner.    Influenza A by PCR NEGATIVE NEGATIVE Final   Influenza B by PCR NEGATIVE NEGATIVE Final    Comment: (NOTE) The Xpert Xpress SARS-CoV-2/FLU/RSV assay is intended as an aid in  the diagnosis of influenza from Nasopharyngeal swab specimens and  should not be used as a sole basis for treatment. Nasal washings and  aspirates are unacceptable for Xpert Xpress SARS-CoV-2/FLU/RSV  testing. Fact Sheet for Patients: PinkCheek.be Fact Sheet for Healthcare Providers: GravelBags.it This test is not yet approved or cleared by the [place] FDA and  has been authorized for detection and/or diagnosis of SARS-CoV-2 by  FDA under an Emergency Use Authorization (EUA). This EUA will remain  in  effect (meaning this test can be used) for the duration of the  Covid-19 declaration under Section 564(b)(1) of the Act, 21  U.S.C. section 360bbb-3(b)(1), unless the authorization is  terminated or revoked. Performed at [facility], [address] [place], [place] [address]       Radiology Studies: DG Chest 2 View  Result Date: 11/25/2019 CLINICAL DATA:  74 year old male with shortness of breath and cough EXAM: CHEST - 2 VIEW COMPARISON:  None FINDINGS: Left lower lobe patchy opacity most consistent with pneumonia. Clinical correlation and follow-up recommended. A small left pleural effusion may be present. There is no pneumothorax. The cardiac silhouette is within normal limits. Atherosclerotic calcification of the aorta. No acute osseous pathology. IMPRESSION: Left lower lobe pneumonia. Clinical correlation and follow-up recommended. Electronically Signed   By: Anner Crete M.D.   On: 11/25/2019 17:46     Scheduled Meds: . aspirin EC  81 mg Oral QHS  . benzonatate  200 mg Oral TID  . enoxaparin (LOVENOX) injection  40 mg Subcutaneous Q24H  . fenofibrate  160 mg Oral Daily  . gabapentin  200 mg Oral QHS  . gabapentin  600 mg Oral QHS  . ipratropium-albuterol  3 mL Nebulization QID  . levothyroxine  50 mcg Oral QAC breakfast  . losartan  25 mg Oral Daily  . mirabegron ER  50 mg Oral Daily  . mometasone-formoterol  2 puff Inhalation BID  . montelukast  5 mg Oral QHS  . nortriptyline  150 mg Oral Daily  . tamsulosin  0.4 mg Oral QHS   Continuous Infusions: . azithromycin Stopped (11/25/19 2219)  . cefTRIAXone (ROCEPHIN)  IV Stopped (11/25/19 2256)     LOS: 1 day     Enzo Bi, MD [facility] If 7PM-7AM, please contact night-coverage 11/26/2019, 8:06 PM

## 2019-11-26 NOTE — ED Notes (Signed)
Pt assisted to toilet. Pt ambulatory with steady gait. Pt assisted into clean gown. Pt instructed to use red call bell when he is done.

## 2019-11-26 NOTE — ED Notes (Signed)
Pt assisted back to bed. Pt placed back on monitor. Pt resting at this time. Lights dimmed per pt request.

## 2019-11-26 NOTE — ED Notes (Addendum)
This RN assisted pt with ambulating to bathroom. Pt experienced increased SOB and coughing while pivoting from stretcher to toilet. Pt expresses pain in chest from SOB however denies pain medication.

## 2019-11-26 NOTE — ED Notes (Signed)
Pt assisted to toilet. Pt instructed to pull red call bell when done.

## 2019-11-26 NOTE — ED Notes (Signed)
Pt assisted back to bed by this RN and Therapist, sports. Pillow placed under pt's bottom. Pt repositioned in bed. Pt placed back on monitor and hooked to 2L [place] O2 at this time. TV turned on for pt. Pt given apple juice. Pt informed breakfast will be served within the hour.

## 2019-11-26 NOTE — ED Notes (Addendum)
This nurse assisted pt with using the restroom. Pt was visibly unsteady on his feet at first but was able to gain balance and pivot to toilet from stretcher with the help of this RN. Pt experienced increased SOB while getting up to use the restroom. Pt O2 was 95% after returning to stretcher from using restroom.

## 2019-11-26 NOTE — Progress Notes (Signed)
CH met w/ pt. per OR for AD education.  Pt. in bed w/lights dimmed watching TV.  Pt. shared he is suffering from COPD and pneumonia; experiences painful coughing fits that are 'frightening' to him.  Pt. shared feeling that his condition was seriuos: 'I'm pretty sick... I'm gonna be here for a while'  Discussion w/CH interrupted abruptly by coughing fit; Hollister judged this is not good time for AD discussion; left document w/RN for pt.'s perusal and made RN aware of CH's availability.  No further needs expressed at this time.     11/26/19 1945  Clinical Encounter Type  Visited With Patient;Health care provider  Visit Type Other (Comment);Initial (AD Education)  Referral From Nurse  Consult/Referral To Chaplain  Recommendations  (Follow up w/AD educ. if requested)  Spiritual Encounters  Spiritual Needs Emotional  Stress Factors  Patient Stress Factors Exhausted;Health changes;Loss of control

## 2019-11-26 NOTE — Progress Notes (Signed)
Report called to [facility]. Patient to be transported to room.

## 2019-11-26 NOTE — ED Notes (Signed)
Pt given warm blanket.

## 2019-11-27 LAB — CBC
HCT: 34.7 % — ABNORMAL LOW (ref 39.0–52.0)
Hemoglobin: 11.8 g/dL — ABNORMAL LOW (ref 13.0–17.0)
MCH: 29.9 pg (ref 26.0–34.0)
MCHC: 34 g/dL (ref 30.0–36.0)
MCV: 88.1 fL (ref 80.0–100.0)
Platelets: 409 10*3/uL — ABNORMAL HIGH (ref 150–400)
RBC: 3.94 MIL/uL — ABNORMAL LOW (ref 4.22–5.81)
RDW: 14.6 % (ref 11.5–15.5)
WBC: 14.6 10*3/uL — ABNORMAL HIGH (ref 4.0–10.5)
nRBC: 0 % (ref 0.0–0.2)

## 2019-11-27 LAB — BASIC METABOLIC PANEL
Anion gap: 8 (ref 5–15)
BUN: 21 mg/dL (ref 8–23)
CO2: 25 mmol/L (ref 22–32)
Calcium: 8.4 mg/dL — ABNORMAL LOW (ref 8.9–10.3)
Chloride: 104 mmol/L (ref 98–111)
Creatinine, Ser: 1.38 mg/dL — ABNORMAL HIGH (ref 0.61–1.24)
GFR calc Af Amer: 58 mL/min — ABNORMAL LOW (ref >60)
GFR calc non Af Amer: 50 mL/min — ABNORMAL LOW (ref >60)
Glucose, Bld: 132 mg/dL — ABNORMAL HIGH (ref 70–99)
Potassium: 4.2 mmol/L (ref 3.5–5.1)
Sodium: 137 mmol/L (ref 135–145)

## 2019-11-27 LAB — MAGNESIUM: Magnesium: 2 mg/dL (ref 1.7–2.4)

## 2019-11-27 LAB — PROCALCITONIN: Procalcitonin: 0.6 ng/mL

## 2019-11-27 MED ORDER — ADULT MULTIVITAMIN W/MINERALS CH
1.0000 | ORAL_TABLET | Freq: Every day | ORAL | Status: DC
  Administered 2019-11-27 – 2019-11-29 (×3): 1 via ORAL
  Filled 2019-11-27 (×3): qty 1

## 2019-11-27 MED ORDER — MAGNESIUM HYDROXIDE 400 MG/5ML PO SUSP
30.0000 mL | Freq: Once | ORAL | Status: AC
  Administered 2019-11-27: 21:00:00 30 mL via ORAL
  Filled 2019-11-27: qty 30

## 2019-11-27 MED ORDER — IPRATROPIUM-ALBUTEROL 0.5-2.5 (3) MG/3ML IN SOLN
3.0000 mL | Freq: Three times a day (TID) | RESPIRATORY_TRACT | Status: DC
  Filled 2019-11-27 (×2): qty 3

## 2019-11-27 MED ORDER — AZITHROMYCIN 500 MG PO TABS
500.0000 mg | ORAL_TABLET | Freq: Every day | ORAL | Status: DC
  Administered 2019-11-27 – 2019-11-28 (×2): 500 mg via ORAL
  Filled 2019-11-27 (×2): qty 1

## 2019-11-27 MED ORDER — POLYETHYLENE GLYCOL 3350 17 G PO PACK
17.0000 g | PACK | Freq: Every day | ORAL | Status: DC | PRN
  Filled 2019-11-27: qty 1

## 2019-11-27 MED ORDER — CYCLOSPORINE 0.05 % OP EMUL
1.0000 [drp] | Freq: Two times a day (BID) | OPHTHALMIC | Status: DC
  Administered 2019-11-27 – 2019-11-29 (×4): 1 [drp] via OPHTHALMIC
  Filled 2019-11-27 (×5): qty 1

## 2019-11-27 MED ORDER — FLUOCINONIDE 0.05 % EX OINT
1.0000 "application " | TOPICAL_OINTMENT | Freq: Every day | CUTANEOUS | Status: DC
  Administered 2019-11-27 – 2019-11-29 (×3): 1 via TOPICAL
  Filled 2019-11-27: qty 15

## 2019-11-27 MED ORDER — ENSURE ENLIVE PO LIQD
237.0000 mL | Freq: Two times a day (BID) | ORAL | Status: DC
  Administered 2019-11-27 – 2019-11-29 (×5): 237 mL via ORAL

## 2019-11-27 MED ORDER — GABAPENTIN 400 MG PO CAPS
800.0000 mg | ORAL_CAPSULE | Freq: Every day | ORAL | Status: DC
  Administered 2019-11-27 – 2019-11-28 (×2): 800 mg via ORAL
  Filled 2019-11-27 (×2): qty 2

## 2019-11-27 MED ORDER — SODIUM CHLORIDE 0.9 % IV SOLN
INTRAVENOUS | Status: DC | PRN
  Administered 2019-11-27: 250 mL via INTRAVENOUS

## 2019-11-27 MED ORDER — POLYVINYL ALCOHOL 1.4 % OP SOLN
1.0000 [drp] | OPHTHALMIC | Status: DC | PRN
  Filled 2019-11-27: qty 15

## 2019-11-27 NOTE — Progress Notes (Signed)
Initial Nutrition Assessment  DOCUMENTATION CODES:   Obesity unspecified  INTERVENTION:  Provide Ensure Enlive po BID, each supplement provides 350 kcal and 20 grams of protein (pt prefers chocolate)  MVI daily  NUTRITION DIAGNOSIS:   Increased nutrient needs related to acute illness(community aquired bacterial pnuemonia) as evidenced by estimated needs.  GOAL:   Patient will meet greater than or equal to 90% of their needs  MONITOR:   PO intake, Weight trends, Labs, I & O's, Supplement acceptance  REASON FOR ASSESSMENT:   Malnutrition Screening Tool    ASSESSMENT:  RD working remotely.  74 year old male with past medical history of HTN, HLD, BPH, OSA, tremors, insomnia, chronic headaches and severe persistent asthma, reported positive for COVID approximately 2 weeks ago presented to ED with increased cough with wheezing and complains of left sided rib pain secondary to multiple falls due to weakness. Patient admitted with sepsis secondary to left lower lobe pneumonia.  Per chart review, COVID negative. Patient to return to [facility] assisted [facility] facility upon discharge.  Patient documented with 100% po intake of breakfast this morning. Spoke with patient via phone this afternoon. He reports having fruit salad, lemonade and orange sherbet for lunch and stated his breakfast was great this morning.   Patient reports good appetite prior to admission, stated 3 meals/day provided for him although he doesn't care for the food very much. He recalls meatloaf, spaghetti with meat sauce, beef tips, pizza, pork chops and sloppy joes.   Patient denies any recent weight loss. He stated that activities have been limited at facility due to pandemic and feels he has probably gained weight. He recalls UBW 210 lbs.   Current wt 95.3 kg (209.66 lbs)  Weight recent weight history available for review. Last weight prior to admission 90.7 kg (November 2019)  Patient with increased needs  secondary to bacterial pneumonia and would benefit from oral nutrition supplement. Patient amenable to chocolate Ensure during admission.    Medications reviewed and include: Gabapentin, Lovenox, Rocephin  Labs: BG 132,102, Cr 1.38 (H), WBC 14.6 (H)  NUTRITION - FOCUSED PHYSICAL EXAM: Unable to complete at this time, RD working remotely.    Diet Order:   Diet Order            Diet Heart Room service appropriate? Yes; Fluid consistency: Thin  Diet effective now              EDUCATION NEEDS:   No education needs have been identified at this time  Skin:  Skin Assessment: Reviewed RN Assessment  Last BM:  PTA  Height:   Ht Readings from Last 1 Encounters:  11/26/19 5\' 9"  (1.753 m)    Weight:   Wt Readings from Last 1 Encounters:  11/26/19 95.3 kg    Ideal Body Weight:  72.7 kg  BMI:  Body mass index is 31.01 kg/m.  Estimated Nutritional Needs:   Kcal:  1900-2100  Protein:  95-105  Fluid:  >/= 1.9 L/day   Lajuan Lines, RD, LDN Clinical Nutrition [place] Telephone [phone] After Hours/Weekend Pager: [phone]

## 2019-11-27 NOTE — Progress Notes (Signed)
PHARMACIST - PHYSICIAN COMMUNICATION DR:   Billie Ruddy CONCERNING: Antibiotic IV to Oral Route Change Policy  RECOMMENDATION: This patient is receiving Azithromycin by the intravenous route.  Based on criteria approved by the Pharmacy and Therapeutics Committee, the antibiotic(s) is/are being converted to the equivalent oral dose form(s).  DESCRIPTION: These criteria include:  Patient being treated for a respiratory tract infection, urinary tract infection, cellulitis or clostridium difficile associated diarrhea if on metronidazole  The patient is not neutropenic and does not exhibit a GI malabsorption state  The patient is eating (either orally or via tube) and/or has been taking other orally administered medications for a least 24 hours  The patient is improving clinically and has a Tmax < 100.5  If you have questions about this conversion, please contact the [place], PharmD, BCPS Clinical Pharmacist 11/27/2019 11:55 AM

## 2019-11-27 NOTE — TOC Progression Note (Signed)
Transition of Care [facility]) - Progression Note    Patient Details  Name: Dakota Harper MRN: [id number] Date of Birth: [birth year]  Transition of Care [facility]) CM/SW [place], RN Phone Number: 11/27/2019, 10:16 AM  Clinical Narrative:     Damaris Schooner with Enid Derry at [facility], the patient resides there at the [facility] and they will accept the patient back at [place]      Expected Discharge Plan and Services                                                 Social Determinants of Health (SDOH) Interventions    Readmission Risk Interventions No flowsheet data found.

## 2019-11-27 NOTE — Progress Notes (Signed)
PROGRESS NOTE    Dakota Harper  [id number] DOB: May 24, 1946 DOA: 11/25/2019 PCP: [facility]    Assessment & Plan:   Principal Problem:   Community acquired bacterial pneumonia Active Problems:   Sepsis ([place])   Weakness   CKD (chronic kidney disease), stage III   Hypothyroidism   BPH (benign prostatic hyperplasia)   Headache   Tremors of nervous system   Thrombocytosis (H[facility])   Elevated bilirubin   Pneumonia    Dakota Harper is a 74 y.o. Caucasian male with medical history significant of severe persistent asthma, chronic headaches, insomnia, tremors, OSA, BPH who presents with concerns of increase cough and wheezing.   ED Course: He was afebrile, tachycardic, tachynpeic at 92% oxygen saturation on room air.  WBC of 18.4. CXR showed left lower lobe pneumonia.   He received 500cc bolus and was started on Rocephin and Azithromycin.   # Sepsis 2/2 Left lower lobe community acquired pneumonia # Acute hypoxic respiratory failure 2/2 CAP --COVID neg.  Procal 0.95, suggestive of sepsis.  No significant wheezing. PLAN: --Continue IV Rocephin and azithromycin --Incentive spirometry --Tessalon 200 mg TID scheduled (pt said this is the only cough med that works for him) --DuoNeb QID scheduled  Weakness secondary to pneumonia PT eval  Thrombocytosis  plt of 475 reactive   Elevated total bilirubin reactive   Hypertension  Continue Losartan   CKD 3b Presenting creatinine of 1.75/GFR 37. Baseline unknown Avoid nephrotoxic agents   Hypothyroidism Continue levothyroxine  BPH Continue Flomax   Chronic headache Continue nortriptyline  Tremors Continue gabapentin   DVT prophylaxis: Lovenox SQ Code Status: Full code  Family Communication: Not today Disposition Plan: Home in 1-2 days   Subjective and Interval History:  Still having bad coughing spells which hurts his chest, but seemed much improved today.  Able to talk for a long time  without an episode of coughing.  Complained of dry eyes.  No fever, dyspnea, abdominal pain, N/V/D, dysuria, increased swelling.   Objective: Vitals:   11/27/19 0926 11/27/19 0929 11/27/19 1201 11/27/19 1543  BP: (!) 113/54 (!) 108/48 (P) 120/64 131/66  Pulse: 86   (!) 102  Resp:    18  Temp:    98 F (36.7 C)  TempSrc:      SpO2:    95%  Weight:      Height:        Intake/Output Summary (Last 24 hours) at 11/27/2019 1954 Last data filed at 11/27/2019 0945 Gross per 24 hour  Intake 811.65 ml  Output 300 ml  Net 511.65 ml   Filed Weights   11/25/19 1710 11/26/19 0720  Weight: 95.3 kg 95.3 kg    Examination:   Constitutional: NAD, AAOx3 HEENT: conjunctivae and lids normal, EOMI CV: RRR no M,R,G. Distal pulses +2.  No cyanosis.   RESP: No wheezes, clear lung sounds, on RA GI: +BS, NTND Extremities: No effusions, edema, or tenderness in BLE SKIN: warm, dry and intact Neuro: II - XII grossly intact.  Sensation intact Psych: Depressed mood and affect.     Data Reviewed: I have personally reviewed following labs and imaging studies  CBC: Recent Labs  Lab 11/25/19 1716 11/26/19 0614 11/27/19 0517  WBC 18.4* 17.1* 14.6*  NEUTROABS 15.6*  --   --   HGB 14.2 12.2* 11.8*  HCT 42.5 36.1* 34.7*  MCV 88.2 87.8 88.1  PLT 475* 381 [id number]*   Basic Metabolic Panel: Recent Labs  Lab 11/25/19 1716 11/26/19 [id number]  11/27/19 0517  NA 135 136 137  K 4.2 4.3 4.2  CL 99 104 104  CO2 23 22 25   GLUCOSE 132* 102* 132*  BUN 23 23 21   CREATININE 1.76* 1.55* 1.38*  CALCIUM 9.2 8.2* 8.4*  MG  --   --  2.0   GFR: Estimated Creatinine Clearance: 53.5 mL/min (A) (by C-G formula based on SCr of 1.38 mg/dL (H)). Liver Function Tests: Recent Labs  Lab 11/25/19 1716  AST 27  ALT 21  ALKPHOS 56  BILITOT 1.7*  PROT 7.8  ALBUMIN 2.9*   No results for input(s): LIPASE, AMYLASE in the last 168 hours. No results for input(s): AMMONIA in the last 168 hours. Coagulation Profile: No  results for input(s): INR, PROTIME in the last 168 hours. Cardiac Enzymes: No results for input(s): CKTOTAL, CKMB, CKMBINDEX, TROPONINI in the last 168 hours. BNP (last 3 results) No results for input(s): PROBNP in the last 8760 hours. HbA1C: No results for input(s): HGBA1C in the last 72 hours. CBG: No results for input(s): GLUCAP in the last 168 hours. Lipid Profile: No results for input(s): CHOL, HDL, LDLCALC, TRIG, CHOLHDL, LDLDIRECT in the last 72 hours. Thyroid Function Tests: No results for input(s): TSH, T4TOTAL, FREET4, T3FREE, THYROIDAB in the last 72 hours. Anemia Panel: No results for input(s): VITAMINB12, FOLATE, FERRITIN, TIBC, IRON, RETICCTPCT in the last 72 hours. Sepsis Labs: Recent Labs  Lab 11/25/19 1716 11/26/19 0614 11/27/19 0517  PROCALCITON  --  0.95 0.60  LATICACIDVEN 1.0  --   --     Recent Results (from the past 240 hour(s))  Blood Culture (routine x 2)     Status: None (Preliminary result)   Collection Time: 11/25/19  8:07 PM   Specimen: BLOOD  Result Value Ref Range Status   Specimen Description BLOOD LEFT ANTECUBITAL  Final   Special Requests   Final    BOTTLES DRAWN AEROBIC AND ANAEROBIC Blood Culture adequate volume   Culture   Final    NO GROWTH 2 DAYS Performed at [facility], [address], [place], [place] [address]    Report Status PENDING  Incomplete  Blood Culture (routine x 2)     Status: None (Preliminary result)   Collection Time: 11/25/19  8:12 PM   Specimen: BLOOD  Result Value Ref Range Status   Specimen Description BLOOD BLOOD RIGHT WRIST  Final   Special Requests   Final    BOTTLES DRAWN AEROBIC AND ANAEROBIC Blood Culture adequate volume   Culture   Final    NO GROWTH 2 DAYS Performed at [facility], [address], [place], [place] [address]    Report Status PENDING  Incomplete  Respiratory Panel by RT PCR (Flu A&B, Covid) - Nasopharyngeal Swab     Status: None   Collection Time: 11/25/19  8:50  PM   Specimen: Nasopharyngeal Swab  Result Value Ref Range Status   SARS Coronavirus 2 by RT PCR NEGATIVE NEGATIVE Final    Comment: (NOTE) SARS-CoV-2 target nucleic acids are NOT DETECTED. The SARS-CoV-2 RNA is generally detectable in upper respiratoy specimens during the acute phase of infection. The lowest concentration of SARS-CoV-2 viral copies this assay can detect is 131 copies/mL. A negative result does not preclude SARS-Cov-2 infection and should not be used as the sole basis for treatment or other patient management decisions. A negative result may occur with  improper specimen collection/handling, submission of specimen other than nasopharyngeal swab, presence of viral mutation(s) within the areas  targeted by this assay, and inadequate number of viral copies (<131 copies/mL). A negative result must be combined with clinical observations, patient history, and epidemiological information. The expected result is Negative. Fact Sheet for Patients:  PinkCheek.be Fact Sheet for Healthcare Providers:  GravelBags.it This test is not yet ap proved or cleared by the [place] FDA and  has been authorized for detection and/or diagnosis of SARS-CoV-2 by FDA under an Emergency Use Authorization (EUA). This EUA will remain  in effect (meaning this test can be used) for the duration of the COVID-19 declaration under Section 564(b)(1) of the Act, 21 U.S.C. section 360bbb-3(b)(1), unless the authorization is terminated or revoked sooner.    Influenza A by PCR NEGATIVE NEGATIVE Final   Influenza B by PCR NEGATIVE NEGATIVE Final    Comment: (NOTE) The Xpert Xpress SARS-CoV-2/FLU/RSV assay is intended as an aid in  the diagnosis of influenza from Nasopharyngeal swab specimens and  should not be used as a sole basis for treatment. Nasal washings and  aspirates are unacceptable for Xpert Xpress SARS-CoV-2/FLU/RSV  testing. Fact  Sheet for Patients: PinkCheek.be Fact Sheet for Healthcare Providers: GravelBags.it This test is not yet approved or cleared by the [place] FDA and  has been authorized for detection and/or diagnosis of SARS-CoV-2 by  FDA under an Emergency Use Authorization (EUA). This EUA will remain  in effect (meaning this test can be used) for the duration of the  Covid-19 declaration under Section 564(b)(1) of the Act, 21  U.S.C. section 360bbb-3(b)(1), unless the authorization is  terminated or revoked. Performed at [facility], [address] [place], [place] [address]   MRSA PCR Screening     Status: Abnormal   Collection Time: 11/26/19  6:47 PM   Specimen: Nasopharyngeal  Result Value Ref Range Status   MRSA by PCR POSITIVE (A) NEGATIVE Final    Comment:        The GeneXpert MRSA Assay (FDA approved for NASAL specimens only), is one component of a comprehensive MRSA colonization surveillance program. It is not intended to diagnose MRSA infection nor to guide or monitor treatment for MRSA infections. RESULT CALLED TO, READ BACK BY AND VERIFIED WITH: [facility] 11/26/19 @ 2016  Angelica Performed at [facility], [address], [place],  [address]       Radiology Studies: No results found.   Scheduled Meds: . aspirin EC  81 mg Oral QHS  . azithromycin  500 mg Oral Daily  . benzonatate  200 mg Oral TID  . Chlorhexidine Gluconate Cloth  6 each Topical Q0600  . cycloSPORINE  1 drop Both Eyes BID  . enoxaparin (LOVENOX) injection  40 mg Subcutaneous Q24H  . feeding supplement (ENSURE ENLIVE)  237 mL Oral BID BM  . fenofibrate  160 mg Oral Daily  . fluocinonide ointment  1 application Topical Daily  . gabapentin  800 mg Oral QHS  . ipratropium-albuterol  3 mL Nebulization TID  . levothyroxine  50 mcg Oral QAC breakfast  . losartan  25 mg Oral Daily  . mirabegron ER  50 mg Oral Daily   . mometasone-formoterol  2 puff Inhalation BID  . montelukast  5 mg Oral QHS  . multivitamin with minerals  1 tablet Oral Daily  . mupirocin ointment  1 application Nasal BID  . nortriptyline  150 mg Oral Daily  . tamsulosin  0.4 mg Oral QHS   Continuous Infusions: . cefTRIAXone (ROCEPHIN)  IV 2 g (11/26/19 2138)  LOS: 2 days     Enzo Bi, MD [facility] If 7PM-7AM, please contact night-coverage 11/27/2019, 7:54 PM

## 2019-11-28 LAB — BASIC METABOLIC PANEL
Anion gap: 9 (ref 5–15)
BUN: 18 mg/dL (ref 8–23)
CO2: 25 mmol/L (ref 22–32)
Calcium: 8.7 mg/dL — ABNORMAL LOW (ref 8.9–10.3)
Chloride: 105 mmol/L (ref 98–111)
Creatinine, Ser: 1.31 mg/dL — ABNORMAL HIGH (ref 0.61–1.24)
GFR calc Af Amer: >60 mL/min (ref >60)
GFR calc non Af Amer: 53 mL/min — ABNORMAL LOW (ref >60)
Glucose, Bld: 105 mg/dL — ABNORMAL HIGH (ref 70–99)
Potassium: 4.6 mmol/L (ref 3.5–5.1)
Sodium: 139 mmol/L (ref 135–145)

## 2019-11-28 LAB — CBC
HCT: 33 % — ABNORMAL LOW (ref 39.0–52.0)
Hemoglobin: 11.4 g/dL — ABNORMAL LOW (ref 13.0–17.0)
MCH: 30.1 pg (ref 26.0–34.0)
MCHC: 34.5 g/dL (ref 30.0–36.0)
MCV: 87.1 fL (ref 80.0–100.0)
Platelets: 466 10*3/uL — ABNORMAL HIGH (ref 150–400)
RBC: 3.79 MIL/uL — ABNORMAL LOW (ref 4.22–5.81)
RDW: 14.4 % (ref 11.5–15.5)
WBC: 13.5 10*3/uL — ABNORMAL HIGH (ref 4.0–10.5)
nRBC: 0 % (ref 0.0–0.2)

## 2019-11-28 LAB — PROCALCITONIN: Procalcitonin: 0.83 ng/mL

## 2019-11-28 LAB — MAGNESIUM: Magnesium: 2.1 mg/dL (ref 1.7–2.4)

## 2019-11-28 MED ORDER — ONDANSETRON HCL 4 MG/2ML IJ SOLN: 4.0000 mg | Freq: Four times a day (QID) | INTRAMUSCULAR | Status: DC | PRN

## 2019-11-28 MED ORDER — LEVOFLOXACIN 500 MG PO TABS
500.0000 mg | ORAL_TABLET | Freq: Every day | ORAL | Status: DC
  Administered 2019-11-28: 21:00:00 500 mg via ORAL
  Filled 2019-11-28: qty 1

## 2019-11-28 MED ORDER — CALCIUM CARBONATE ANTACID 500 MG PO CHEW: 1.0000 | CHEWABLE_TABLET | Freq: Three times a day (TID) | ORAL | Status: DC | PRN

## 2019-11-28 MED ORDER — ONDANSETRON 4 MG PO TBDP
4.0000 mg | ORAL_TABLET | Freq: Three times a day (TID) | ORAL | Status: DC | PRN
  Filled 2019-11-28: qty 1

## 2019-11-28 MED ORDER — DOCUSATE SODIUM 100 MG PO CAPS: 100.0000 mg | ORAL_CAPSULE | Freq: Two times a day (BID) | ORAL | Status: DC | PRN

## 2019-11-28 MED ORDER — IPRATROPIUM-ALBUTEROL 0.5-2.5 (3) MG/3ML IN SOLN: 3.0000 mL | Freq: Four times a day (QID) | RESPIRATORY_TRACT | Status: DC | PRN

## 2019-11-28 MED ORDER — GUAIFENESIN-DM 100-10 MG/5ML PO SYRP: 10.0000 mL | ORAL_SOLUTION | Freq: Four times a day (QID) | ORAL | Status: DC | PRN

## 2019-11-28 MED ORDER — PREDNISONE 20 MG PO TABS
40.0000 mg | ORAL_TABLET | Freq: Every day | ORAL | Status: DC
  Administered 2019-11-28 – 2019-11-29 (×2): 40 mg via ORAL
  Filled 2019-11-28 (×2): qty 2

## 2019-11-28 MED ORDER — ALUM & MAG HYDROXIDE-SIMETH 200-200-20 MG/5ML PO SUSP: 15.0000 mL | Freq: Four times a day (QID) | ORAL | Status: DC | PRN

## 2019-11-28 MED ORDER — POLYETHYLENE GLYCOL 3350 17 G PO PACK
17.0000 g | PACK | Freq: Two times a day (BID) | ORAL | Status: DC | PRN
  Filled 2019-11-28: qty 1

## 2019-11-28 NOTE — Evaluation (Signed)
Physical Therapy Evaluation Patient Details Name: Dakota Harper MRN: [id number] DOB: 1946/04/30 Today's Date: 11/28/2019   History of Present Illness   74 y.o. male with medical history significant of severe persistent asthma, chronic headaches, insomnia, tremors, OSA, BPH who presents with concerns of increase cough and wheezing.  Clinical Impression  Pt was able to get to standing confidently and safely, he was able to do some minimal ambulation w/o AD but did much better with walker.  He was able to ambulate around the nurses' station to maintain continuous speed and cadence.  Pt's O2 was in the low 90s t/o the effort with some fatigue, cuing t/o the effort for breathing and generally.  Overall pt did well and feels good about his ability to return to his facility, though he is still concerned about his cough (which was significant post ambulation).      Follow Up Recommendations Home health PT(at the [facility])    Equipment Recommendations  None recommended by PT    Recommendations for Other Services       Precautions / Restrictions Precautions Precautions: Fall Restrictions Weight Bearing Restrictions: No      Mobility  Bed Mobility Overal bed mobility: Modified Independent             General bed mobility comments: Pt was able to get to EOB w/o assist, good confidence  Transfers Overall transfer level: Modified independent Equipment used: Rolling walker (2 wheeled)             General transfer comment: Pt was actually able to rise to standing w/o phyiscal assist or AD, however he was more steady and confident when UE were on it  Ambulation/Gait Ambulation/Gait assistance: Modified independent (Device/Increase time) Gait Distance (Feet): 200 Feet Assistive device: Rolling walker (2 wheeled)       General Gait Details: Using the walker pt was able to confidently ambulate into the hallway, he did have some fatigue with the prolonged bout of ambulation but  ultimately was able to safely circumambulate around the nurses' station with O2 remaining in the low 90s and needing only minimal cues for direction, breathing, and walker use.    Stairs            Wheelchair Mobility    Modified Rankin (Stroke Patients Only)       Balance Overall balance assessment: Needs assistance Sitting-balance support: No upper extremity supported Sitting balance-Leahy Scale: Good Sitting balance - Comments: Pt able to sit at EOB w/o issue   Standing balance support: Bilateral upper extremity supported Standing balance-Leahy Scale: Fair Standing balance comment: Pt showed some ability to take a few steps and do functional tasks w/o AD, but balance much more appropriate with walker                             Pertinent Vitals/Pain Pain Assessment: (non-descript rib pain from coughing)    Home Living Family/patient expects to be discharged to:: (return to the [facility])               Home Equipment: Gilford Rile - 4 wheels;Cane - single point      Prior Function Level of Independence: Independent with assistive device(s)         Comments: Pt reports that he is able to be up and around at the [place], he has been more limited recently secondary to covid/cough     Hand Dominance  Extremity/Trunk Assessment   Upper Extremity Assessment Upper Extremity Assessment: Generalized weakness(age appropriate limitations)    Lower Extremity Assessment Lower Extremity Assessment: Generalized weakness(age appropriate limitations)       Communication   Communication: No difficulties  Cognition Arousal/Alertness: Awake/alert Behavior During Therapy: WFL for tasks assessed/performed Overall Cognitive Status: Within Functional Limits for tasks assessed                                        General Comments      Exercises     Assessment/Plan    PT Assessment Patient needs continued PT services  PT Problem List  Decreased activity tolerance;Decreased strength;Decreased balance;Decreased knowledge of use of DME;Decreased safety awareness;Cardiopulmonary status limiting activity;Pain       PT Treatment Interventions DME instruction;Gait training;Stair training;Functional mobility training;Therapeutic activities;Therapeutic exercise;Balance training;Neuromuscular re-education;Patient/family education    PT Goals (Current goals can be found in the Care Plan section)  Acute Rehab PT Goals Patient Stated Goal: get rid of his cough and go home PT Goal Formulation: With patient Time For Goal Achievement: 12/12/19 Potential to Achieve Goals: Good    Frequency Min 2X/week   Barriers to discharge        Co-evaluation               AM-PAC PT "6 Clicks" Mobility  Outcome Measure Help needed turning from your back to your side while in a flat bed without using bedrails?: None Help needed moving from lying on your back to sitting on the side of a flat bed without using bedrails?: None Help needed moving to and from a bed to a chair (including a wheelchair)?: None Help needed standing up from a chair using your arms (e.g., wheelchair or bedside chair)?: None Help needed to walk in hospital room?: A Little Help needed climbing 3-5 steps with a railing? : A Little 6 Click Score: 22    End of Session Equipment Utilized During Treatment: Gait belt Activity Tolerance: Patient tolerated treatment well;Patient limited by fatigue Patient left: with bed alarm set;with call bell/phone within reach Nurse Communication: Mobility status PT Visit Diagnosis: Muscle weakness (generalized) (M62.81);Difficulty in walking, not elsewhere classified (R26.2)    Time: [id number] PT Time Calculation (min) (ACUTE ONLY): 24 min   Charges:   PT Evaluation $PT Eval Low Complexity: 1 Low PT Treatments $Gait Training: 8-22 mins        Kreg Shropshire, DPT 11/28/2019, 11:51 AM

## 2019-11-28 NOTE — Progress Notes (Signed)
PROGRESS NOTE    Dakota Harper  [id number] DOB: 02-Feb-1946 DOA: 11/25/2019 [facility]CP: [facility]    Assessment & Plan:   Principal Problem:   Community acquired bacterial pneumonia Active Problems:   Sepsis ([place])   Weakness   CKD (chronic kidney disease), stage III   Hypothyroidism   BPH (benign prostatic hyperplasia)   Headache   Tremors of nervous system   Thrombocytosis ([facility])   Elevated bilirubin   Pneumonia    Dakota Harper is a 74 y.o. Caucasian male with medical history significant of severe persistent asthma, chronic headaches, insomnia, tremors, OSA, BPH who presents with concerns of increase cough and wheezing.   ED Course: He was afebrile, tachycardic, tachynpeic at 92% oxygen saturation on room air.  WBC of 18.4. CXR showed left lower lobe pneumonia.   He received 500cc bolus and was started on Rocephin and Azithromycin.   # Sepsis 2/2 Left lower lobe community acquired pneumonia # Acute hypoxic respiratory failure 2/2 CAP, resolved # Chronic cough # hx of severe persistent asthma --COVID neg.  Procal 0.95, suggestive of sepsis.  No significant wheezing. --s/p 3 days of IV Rocephin PLAN: --continue azithromycin --de-escalate to Levaquin --start prednisone 40 mg daily for 5 days to calm inflammation --Incentive spirometry --Tessalon 200 mg TID scheduled (pt said this is the only cough med that works for him) --DuoNeb QID PRN --outpatient pulm followup  Weakness secondary to pneumonia PT eval, rec HH[facility]  Thrombocytosis  plt of 475 reactive   Elevated total bilirubin reactive   Hypertension  Continue Losartan   CKD 3b Presenting creatinine of 1.75/GFR 37. Baseline unknown Avoid nephrotoxic agents   Hypothyroidism Continue levothyroxine  BPH Continue Flomax   Chronic headache Continue nortriptyline  Tremors Continue gabapentin   DVT prophylaxis: Lovenox SQ Code Status: Full code  Family  Communication: Not today Disposition Plan: Home in 1-2 days   Subjective and Interval History:  Coughing up sputum.  Didn't think neb tx helped.  Worked with PT and able to ambulate.  Still bothered by cough.  No fever, abdominal pain, N/V/D, dysuria, increased swelling.   Objective: Vitals:   11/27/19 2304 11/28/19 0754 11/28/19 0920 11/28/19 1600  BP: (!) 129/59 123/67 121/73 (!) 145/69  Pulse: 96 99 87 (!) 101  Resp: 17 17  17   Temp: 98.6 F (37 C) 98.2 F (36.8 C)  98.1 F (36.7 C)  TempSrc: Oral Oral  Oral  SpO2: 93% 94%  94%  Weight:      Height:        Intake/Output Summary (Last 24 hours) at 11/28/2019 1914 Last data filed at 11/28/2019 0214 Gross per 24 hour  Intake 101.77 ml  Output --  Net 101.77 ml   Filed Weights   11/25/19 1710 11/26/19 0720  Weight: 95.3 kg 95.3 kg    Examination:   Constitutional: NAD, AAOx3 HEENT: conjunctivae and lids normal, EOMI CV: RRR no M,R,G. Distal pulses +2.  No cyanosis.   RESP: No wheezes, clear lung sounds, on RA GI: +BS, NTND Extremities: No effusions, edema, or tenderness in BLE SKIN: warm, dry and intact Neuro: II - XII grossly intact.  Sensation intact Psych: better mood and affect.     Data Reviewed: I have personally reviewed following labs and imaging studies  CBC: Recent Labs  Lab 11/25/19 1716 11/26/19 0614 11/27/19 0517 11/28/19 0433  WBC 18.4* 17.1* 14.6* 13.5*  NEUTROABS 15.6*  --   --   --  HGB 14.2 12.2* 11.8* 11.4*  HCT 42.5 36.1* 34.7* 33.0*  MCV 88.2 87.8 88.1 87.1  PLT 475* 381 409* [id number]*   Basic Metabolic Panel: Recent Labs  Lab 11/25/19 1716 11/26/19 0614 11/27/19 0517 11/28/19 0433  NA 135 136 137 139  K 4.2 4.3 4.2 4.6  CL 99 104 104 105  CO2 23 22 25 25   GLUCOSE 132* 102* 132* 105*  BUN 23 23 21 18   CREATININE 1.76* 1.55* 1.38* 1.31*  CALCIUM 9.2 8.2* 8.4* 8.7*  MG  --   --  2.0 2.1   GFR: Estimated Creatinine Clearance: 56.3 mL/min (A) (by C-G formula based on SCr of  1.31 mg/dL (H)). Liver Function Tests: Recent Labs  Lab 11/25/19 1716  AST 27  ALT 21  ALKPHOS 56  BILITOT 1.7*  PROT 7.8  ALBUMIN 2.9*   No results for input(s): LIPASE, AMYLASE in the last 168 hours. No results for input(s): AMMONIA in the last 168 hours. Coagulation Profile: No results for input(s): INR, PROTIME in the last 168 hours. Cardiac Enzymes: No results for input(s): CKTOTAL, CKMB, CKMBINDEX, TROPONINI in the last 168 hours. BNP (last 3 results) No results for input(s): PROBNP in the last 8760 hours. HbA1C: No results for input(s): HGBA1C in the last 72 hours. CBG: No results for input(s): GLUCAP in the last 168 hours. Lipid Profile: No results for input(s): CHOL, HDL, LDLCALC, TRIG, CHOLHDL, LDLDIRECT in the last 72 hours. Thyroid Function Tests: No results for input(s): TSH, T4TOTAL, FREET4, T3FREE, THYROIDAB in the last 72 hours. Anemia Panel: No results for input(s): VITAMINB12, FOLATE, FERRITIN, TIBC, IRON, RETICCTPCT in the last 72 hours. Sepsis Labs: Recent Labs  Lab 11/25/19 1716 11/26/19 0614 11/27/19 0517 11/28/19 0433  PROCALCITON  --  0.95 0.60 0.83  LATICACIDVEN 1.0  --   --   --     Recent Results (from the past 240 hour(s))  Blood Culture (routine x 2)     Status: None (Preliminary result)   Collection Time: 11/25/19  8:07 PM   Specimen: BLOOD  Result Value Ref Range Status   Specimen Description BLOOD LEFT ANTECUBITAL  Final   Special Requests   Final    BOTTLES DRAWN AEROBIC AND ANAEROBIC Blood Culture adequate volume   Culture   Final    NO GROWTH 3 DAYS Performed at [facility], [address] [place], [place] [address]    Report Status PENDING  Incomplete  Blood Culture (routine x 2)     Status: None (Preliminary result)   Collection Time: 11/25/19  8:12 PM   Specimen: BLOOD  Result Value Ref Range Status   Specimen Description BLOOD BLOOD RIGHT WRIST  Final   Special Requests   Final    BOTTLES DRAWN AEROBIC AND  ANAEROBIC Blood Culture adequate volume   Culture   Final    NO GROWTH 3 DAYS Performed at [facility], [address] [place], [place] [address]    Report Status PENDING  Incomplete  Respiratory Panel by RT PCR (Flu A&B, Covid) - Nasopharyngeal Swab     Status: None   Collection Time: 11/25/19  8:50 PM   Specimen: Nasopharyngeal Swab  Result Value Ref Range Status   SARS Coronavirus 2 by RT PCR NEGATIVE NEGATIVE Final    Comment: (NOTE) SARS-CoV-2 target nucleic acids are NOT DETECTED. The SARS-CoV-2 RNA is generally detectable in upper respiratoy specimens during the acute phase of infection. The lowest concentration of SARS-CoV-2 viral copies this assay  can detect is 131 copies/mL. A negative result does not preclude SARS-Cov-2 infection and should not be used as the sole basis for treatment or other patient management decisions. A negative result may occur with  improper specimen collection/handling, submission of specimen other than nasopharyngeal swab, presence of viral mutation(s) within the areas targeted by this assay, and inadequate number of viral copies (<131 copies/mL). A negative result must be combined with clinical observations, patient history, and epidemiological information. The expected result is Negative. Fact Sheet for Patients:  PinkCheek.be Fact Sheet for Healthcare Providers:  GravelBags.it This test is not yet ap proved or cleared by the [place] FDA and  has been authorized for detection and/or diagnosis of SARS-CoV-2 by FDA under an Emergency Use Authorization (EUA). This EUA will remain  in effect (meaning this test can be used) for the duration of the COVID-19 declaration under Section 564(b)(1) of the Act, 21 U.S.C. section 360bbb-3(b)(1), unless the authorization is terminated or revoked sooner.    Influenza A by PCR NEGATIVE NEGATIVE Final   Influenza B by PCR NEGATIVE  NEGATIVE Final    Comment: (NOTE) The Xpert Xpress SARS-CoV-2/FLU/RSV assay is intended as an aid in  the diagnosis of influenza from Nasopharyngeal swab specimens and  should not be used as a sole basis for treatment. Nasal washings and  aspirates are unacceptable for Xpert Xpress SARS-CoV-2/FLU/RSV  testing. Fact Sheet for Patients: PinkCheek.be Fact Sheet for Healthcare Providers: GravelBags.it This test is not yet approved or cleared by the [place] FDA and  has been authorized for detection and/or diagnosis of SARS-CoV-2 by  FDA under an Emergency Use Authorization (EUA). This EUA will remain  in effect (meaning this test can be used) for the duration of the  Covid-19 declaration under Section 564(b)(1) of the Act, 21  U.S.C. section 360bbb-3(b)(1), unless the authorization is  terminated or revoked. Performed at [facility], [address] [place], [place] [address]   MRSA PCR Screening     Status: Abnormal   Collection Time: 11/26/19  6:47 PM   Specimen: Nasopharyngeal  Result Value Ref Range Status   MRSA by PCR POSITIVE (A) NEGATIVE Final    Comment:        The GeneXpert MRSA Assay (FDA approved for NASAL specimens only), is one component of a comprehensive MRSA colonization surveillance program. It is not intended to diagnose MRSA infection nor to guide or monitor treatment for MRSA infections. RESULT CALLED TO, READ BACK BY AND VERIFIED WITH: [facility] 11/26/19 @ 2016  [place] Performed at [facility], [address] [place], [place] [address]       Radiology Studies: No results found.   Scheduled Meds: . aspirin EC  81 mg Oral QHS  . azithromycin  500 mg Oral Daily  . benzonatate  200 mg Oral TID  . Chlorhexidine Gluconate Cloth  6 each Topical Q0600  . cycloSPORINE  1 drop Both Eyes BID  . enoxaparin (LOVENOX) injection  40 mg Subcutaneous Q24H  . feeding  supplement (ENSURE ENLIVE)  237 mL Oral BID BM  . fenofibrate  160 mg Oral Daily  . fluocinonide ointment  1 application Topical Daily  . gabapentin  800 mg Oral QHS  . levofloxacin  500 mg Oral QHS  . levothyroxine  50 mcg Oral QAC breakfast  . losartan  25 mg Oral Daily  . mirabegron ER  50 mg Oral Daily  . mometasone-formoterol  2 puff Inhalation BID  . montelukast  5 mg Oral QHS  . multivitamin with minerals  1 tablet Oral Daily  . mupirocin ointment  1 application Nasal BID  . nortriptyline  150 mg Oral Daily  . predniSONE  40 mg Oral Q breakfast  . tamsulosin  0.4 mg Oral QHS   Continuous Infusions: . sodium chloride Stopped (11/27/19 2213)     LOS: 3 days     Enzo Bi, MD [facility] If 7PM-7AM, please contact night-coverage 11/28/2019, 7:14 PM

## 2019-11-28 NOTE — TOC Progression Note (Signed)
Transition of Care [facility]) - Progression Note    Patient Details  Name: Dakota Harper MRN: [id number] Date of Birth: 23-Jun-1946  Transition of Care [facility]) CM/SW [place], RN Phone Number: 11/28/2019, 2:48 PM  Clinical Narrative:    Hulen Skains the [facility] and let them know the patient will return tomorrow        Expected Discharge Plan and Services                                                 Social Determinants of Health (SDOH) Interventions    Readmission Risk Interventions No flowsheet data found.

## 2019-11-28 NOTE — Care Management Important Message (Signed)
Important Message  Patient Details  Name: Dakota Harper MRN: [id number] Date of Birth: 01-01-1946   Medicare Important Message Given:  Yes     Dannette Barbara 11/28/2019, 11:32 AM

## 2019-11-29 LAB — CBC
HCT: 33.1 % — ABNORMAL LOW (ref 39.0–52.0)
Hemoglobin: 11.2 g/dL — ABNORMAL LOW (ref 13.0–17.0)
MCH: 29.5 pg (ref 26.0–34.0)
MCHC: 33.8 g/dL (ref 30.0–36.0)
MCV: 87.1 fL (ref 80.0–100.0)
Platelets: 437 10*3/uL — ABNORMAL HIGH (ref 150–400)
RBC: 3.8 MIL/uL — ABNORMAL LOW (ref 4.22–5.81)
RDW: 14.2 % (ref 11.5–15.5)
WBC: 13.2 10*3/uL — ABNORMAL HIGH (ref 4.0–10.5)
nRBC: 0 % (ref 0.0–0.2)

## 2019-11-29 LAB — BASIC METABOLIC PANEL
Anion gap: 9 (ref 5–15)
BUN: 24 mg/dL — ABNORMAL HIGH (ref 8–23)
CO2: 27 mmol/L (ref 22–32)
Calcium: 9.3 mg/dL (ref 8.9–10.3)
Chloride: 104 mmol/L (ref 98–111)
Creatinine, Ser: 1.19 mg/dL (ref 0.61–1.24)
GFR calc Af Amer: >60 mL/min (ref >60)
GFR calc non Af Amer: 60 mL/min — ABNORMAL LOW (ref >60)
Glucose, Bld: 144 mg/dL — ABNORMAL HIGH (ref 70–99)
Potassium: 4.6 mmol/L (ref 3.5–5.1)
Sodium: 140 mmol/L (ref 135–145)

## 2019-11-29 LAB — MAGNESIUM: Magnesium: 2.2 mg/dL (ref 1.7–2.4)

## 2019-11-29 MED ORDER — PREDNISONE 20 MG PO TABS: 40.0000 mg | ORAL_TABLET | Freq: Every day | ORAL | 0 refills | Status: AC

## 2019-11-29 MED ORDER — LEVOFLOXACIN 500 MG PO TABS: 500.0000 mg | ORAL_TABLET | Freq: Every day | ORAL | 0 refills | Status: AC

## 2019-11-29 MED ORDER — BENZONATATE 200 MG PO CAPS: 200.0000 mg | ORAL_CAPSULE | Freq: Three times a day (TID) | ORAL | 0 refills | Status: AC

## 2019-11-29 MED ORDER — AZITHROMYCIN 500 MG PO TABS: 500.0000 mg | ORAL_TABLET | Freq: Every day | ORAL | 0 refills | Status: AC

## 2019-11-29 NOTE — Plan of Care (Signed)

## 2019-11-29 NOTE — Progress Notes (Signed)
Dakota Harper taxi company called to advise that they are at [employer] entrance for pt discharge; discharge envelope given to the pt; taxi voucher to be given to the taxi driver; pt discharged via wheelchair by nursing to the [facility] entrance

## 2019-11-29 NOTE — Discharge Summary (Signed)
Physician Discharge Summary   Dakota Harper  male DOB: 05/12/1946  XB:[id number]  PCP: Selena Lesser [facility]  Admit date: 11/25/2019 Discharge date:   Admitted From: ALF at [facility]  Disposition:  ALF at [facility]: Yes CODE STATUS: Full code  Discharge Instructions    Diet - low sodium heart healthy   Complete by: As directed    Discharge instructions   Complete by: As directed    Please finish taking 1 more day of antibiotics (Azithromycin and Levaquin) and 3 more days of steroid (prednisone), starting tomorrow.  Please follow up with your pulmonologist as outpatient.  Dr. Enzo Bi - -   Increase activity slowly   Complete by: As directed        Hospital Course:  For full details, please see H&P, progress notes, consult notes and ancillary notes.  Briefly,  Dakota Harper a 74 y.o.Caucasian malewith medical history significant ofsevere persistent asthma, chronic bronchitis, chronic headaches, insomnia, tremors, OSA, BPH who presented from ALF at [facility] with concerns ofincrease cough and wheezing.  # Sepsis 2/2 Left lower lobecommunity acquiredpneumonia # Chronic cough # hx of severe persistent asthma  # Hx of chronic bronchitis In the ED, pt was afebrile, tachycardic, tachynpeic at 92% oxygen saturationon room air.  WBC of 18.4. CXR showed left lower lobe pneumonia.  He received 500cc bolus and was started on Rocephin and Azithromycin.COVID neg.  Procal 0.95, supporting dx of sepsis.  No significant wheezing, however, pt had frequent coughing spells which caused chest pain and breathlessness.  Pt received 3 days of IV Rocephin and then transitioned to Levaquin for 2 more days to finish a 5-day course, along with azithromycin.  Prednisone 40 mg daily was started to help calm inflammation and perhaps help with the painful cough.  Tessalon 200 mg TID were ordered on admission because pt said this is the only cough med that works for him,  however, could not afford it outpatient.  DuoNeb QID scheduled, but pt didn't think it helped, so changed to PRN.  Pt had previously seen a pulmonologist for his respiratory issues, and is advised to continue followup with outpatient pulm.  Weaknesssecondary to pneumonia PT eval, rec HHPT at [facility]  Thrombocytosis  plt of 475, reactive   Elevated total bilirubin reactive  Hypertension Continued Losartan  AKI on CKD3b Presenting creatinine of 1.75/GFR 37.  Cr improved throughout hospitalization.  On the day of discharge, Cr 1.19.  Pt likely had AKI on presentation due to infection and poor PO intake.  Hypothyroidism Continued levothyroxine  BPH Continued Flomax  Chronic headache Continued nortriptyline  Tremors Continued gabapentin   Discharge Diagnoses:  Principal Problem:   Community acquired bacterial pneumonia Active Problems:   Sepsis ([place])   Weakness   CKD (chronic kidney disease), stage III   Hypothyroidism   BPH (benign prostatic hyperplasia)   Headache   Tremors of nervous system   Thrombocytosis (HCC)   Elevated bilirubin   Pneumonia    Discharge Instructions:  Allergies as of 11/29/2019      Reactions   Saw Palmetto Hives, Rash   Sulfa Antibiotics Rash   Sulfacetamide Sodium Rash      Medication List    STOP taking these medications   HYDROcodone-acetaminophen 5-325 MG tablet Commonly known as: NORCO/VICODIN     TAKE these medications   acetaminophen 500 MG tablet Commonly known as: TYLENOL Take 500 mg by mouth every 4 (four) hours as  needed for mild pain.   albuterol 108 (90 Base) MCG/ACT inhaler Commonly known as: VENTOLIN HFA Inhale 1 puff into the lungs 3 (three) times daily.   aspirin EC 81 MG tablet Take 81 mg by mouth at bedtime.   azithromycin 500 MG tablet Commonly known as: ZITHROMAX Take 1 tablet (500 mg total) by mouth daily for 1 day. antibiotic Start taking on: November 30, 2019   barrier cream  Crea Commonly known as: non-specified Apply 1 application topically as needed (post-toileting skin protection).   benzonatate 200 MG capsule Commonly known as: TESSALON Take 1 capsule (200 mg total) by mouth 3 (three) times daily for 10 days.   cycloSPORINE 0.05 % ophthalmic emulsion Commonly known as: RESTASIS Place 1 drop into both eyes 2 (two) times daily as needed (dry eyes).   fenofibrate 145 MG tablet Commonly known as: TRICOR Take 145 mg by mouth at bedtime.   fluocinonide ointment 0.05 % Commonly known as: LIDEX Apply 1 application topically 2 (two) times daily as needed (pruritus).   fluticasone-salmeterol 115-21 MCG/ACT inhaler Commonly known as: ADVAIR HFA Inhale 2 puffs into the lungs 2 (two) times daily.   gabapentin 100 MG capsule Commonly known as: NEURONTIN Take 200 mg by mouth at bedtime. (take with 600mg  tablet for equal 800mg )   gabapentin 600 MG tablet Commonly known as: NEURONTIN Take 600 mg by mouth at bedtime. (take with 100mg  capsules to equal 800mg )   guaiFENesin 100 MG/5ML liquid Commonly known as: ROBITUSSIN Take 300 mg by mouth every 6 (six) hours as needed for cough.   ibuprofen 400 MG tablet Commonly known as: ADVIL Take 400 mg by mouth 3 (three) times daily as needed for mild pain or moderate pain.   levofloxacin 500 MG tablet Commonly known as: LEVAQUIN Take 1 tablet (500 mg total) by mouth daily for 1 day. antibiotic Start taking on: November 30, 2019   levothyroxine 50 MCG tablet Commonly known as: SYNTHROID Take 50 mcg daily before breakfast by mouth.   loperamide 2 MG capsule Commonly known as: IMODIUM Take 4 mg by mouth as needed for diarrhea or loose stools ((max 8 doses in 24 hours)).   losartan 25 MG tablet Commonly known as: COZAAR Take 25 mg by mouth daily.   magnesium hydroxide 400 MG/5ML suspension Commonly known as: MILK OF MAGNESIA Take 30 mLs by mouth daily as needed for mild constipation or moderate  constipation.   Mintox [id number] MG/5ML suspension Generic drug: alum & mag hydroxide-simeth Take 30 mLs by mouth 4 (four) times daily as needed for indigestion or heartburn.   montelukast 5 MG chewable tablet Commonly known as: SINGULAIR Chew 5 mg by mouth at bedtime.   Multi-Vitamins Tabs Take 1 tablet by mouth daily.   Myrbetriq 50 MG Tb24 tablet Generic drug: mirabegron ER Take 50 mg by mouth daily.   nortriptyline 75 MG capsule Commonly known as: PAMELOR Take 150 mg by mouth daily.   Peridex 0.12 % solution Generic drug: chlorhexidine Use as directed 15 mLs in the mouth or throat 2 (two) times daily as needed (oral discomfort).   polyethylene glycol 17 g packet Commonly known as: MIRALAX / GLYCOLAX Take 17 g by mouth daily as needed for mild constipation or moderate constipation.   predniSONE 20 MG tablet Commonly known as: DELTASONE Take 2 tablets (40 mg total) by mouth daily with breakfast for 3 days. Start taking on: November 30, 2019   tamsulosin 0.4 MG Caps capsule Commonly known as: FLOMAX Take 0.4  mg by mouth at bedtime.       Follow-up Information    [place]. Schedule an appointment as soon as possible for a visit in 1 week(s).   Specialty: Assisted Living Facility Contact information: [place] [address] [phone]           Allergies  Allergen Reactions  . Saw Palmetto Hives and Rash  . Sulfa Antibiotics Rash  . Sulfacetamide Sodium Rash     The results of significant diagnostics from this hospitalization (including imaging, microbiology, ancillary and laboratory) are listed below for reference.   Consultations:   Procedures/Studies: DG Chest 2 View  Result Date: 11/25/2019 CLINICAL DATA:  74 year old male with shortness of breath and cough EXAM: CHEST - 2 VIEW COMPARISON:  None FINDINGS: Left lower lobe patchy opacity most consistent with pneumonia. Clinical correlation and follow-up  recommended. A small left pleural effusion may be present. There is no pneumothorax. The cardiac silhouette is within normal limits. Atherosclerotic calcification of the aorta. No acute osseous pathology. IMPRESSION: Left lower lobe pneumonia. Clinical correlation and follow-up recommended. Electronically Signed   By: Anner Crete M.D.   On: 11/25/2019 17:46   DG Knee Complete 4 Views Right  Result Date: 11/11/2019 CLINICAL DATA:  Right knee pain after fall yesterday morning EXAM: RIGHT KNEE - COMPLETE 4+ VIEW COMPARISON:  None. FINDINGS: There is some questionable irregular lucency through the superior pole of the patella incompletely assessed on these four views. Consider further evaluation with axial/sunrise view. Mild thickening of the distal quadriceps tendon is noted as well with prepatellar soft tissue thickening and some stranding in Hoffa's fat pad. No other acute osseous or soft tissue abnormality. No traumatic malalignment. IMPRESSION: 1. Possible fracture lucency through the superior pole of the patella. Correlate for point tenderness and consider further evaluation with axial/sunrise view or consider cross-sectional imaging. 2. Mild thickening of the distal quadriceps tendon, prepatellar soft tissue thickening and stranding in Hoffa's fat pad. 3. No other acute osseous abnormality or traumatic malalignment. Electronically Signed   By: Lovena Le M.D.   On: 11/11/2019 04:50      Labs: BNP (last 3 results) No results for input(s): BNP in the last 8760 hours. Basic Metabolic Panel: Recent Labs  Lab 11/25/19 1716 11/26/19 0614 11/27/19 0517 11/28/19 0433 11/29/19 0741  NA 135 136 137 139 140  K 4.2 4.3 4.2 4.6 4.6  CL 99 104 104 105 104  CO2 23 22 25 25 27   GLUCOSE 132* 102* 132* 105* 144*  BUN 23 23 21 18  24*  CREATININE 1.76* 1.55* 1.38* 1.31* 1.19  CALCIUM 9.2 8.2* 8.4* 8.7* 9.3  MG  --   --  2.0 2.1 2.2   Liver Function Tests: Recent Labs  Lab 11/25/19 1716  AST 27   ALT 21  ALKPHOS 56  BILITOT 1.7*  PROT 7.8  ALBUMIN 2.9*   No results for input(s): LIPASE, AMYLASE in the last 168 hours. No results for input(s): AMMONIA in the last 168 hours. CBC: Recent Labs  Lab 11/25/19 1716 11/26/19 0614 11/27/19 0517 11/28/19 0433 11/29/19 0741  WBC 18.4* 17.1* 14.6* 13.5* 13.2*  NEUTROABS 15.6*  --   --   --   --   HGB 14.2 12.2* 11.8* 11.4* 11.2*  HCT 42.5 36.1* 34.7* 33.0* 33.1*  MCV 88.2 87.8 88.1 87.1 87.1  PLT 475* 381 409* 466* 437*   Cardiac Enzymes: No results for input(s): CKTOTAL, CKMB, CKMBINDEX, TROPONINI in the last  168 hours. BNP: Invalid input(s): POCBNP CBG: No results for input(s): GLUCAP in the last 168 hours. D-Dimer No results for input(s): DDIMER in the last 72 hours. Hgb A1c No results for input(s): HGBA1C in the last 72 hours. Lipid Profile No results for input(s): CHOL, HDL, LDLCALC, TRIG, CHOLHDL, LDLDIRECT in the last 72 hours. Thyroid function studies No results for input(s): TSH, T4TOTAL, T3FREE, THYROIDAB in the last 72 hours.  Invalid input(s): FREET3 Anemia work up No results for input(s): VITAMINB12, FOLATE, FERRITIN, TIBC, IRON, RETICCTPCT in the last 72 hours. Urinalysis    Component Value Date/Time   COLORURINE YELLOW (A) 04/02/2016 1522   APPEARANCEUR Clear 09/17/2017 0905   LABSPEC 1.021 04/02/2016 1522   PHURINE 5.0 04/02/2016 1522   GLUCOSEU Negative 09/17/2017 0905   HGBUR 2+ (A) 04/02/2016 1522   BILIRUBINUR Negative 09/17/2017 0905   KETONESUR NEGATIVE 04/02/2016 1522   PROTEINUR 2+ (A) 09/17/2017 0905   PROTEINUR 30 (A) 04/02/2016 1522   NITRITE Negative 09/17/2017 0905   NITRITE NEGATIVE 04/02/2016 1522   LEUKOCYTESUR Negative 09/17/2017 0905   Sepsis Labs Invalid input(s): PROCALCITONIN,  WBC,  LACTICIDVEN Microbiology Recent Results (from the past 240 hour(s))  Blood Culture (routine x 2)     Status: None (Preliminary result)   Collection Time: 11/25/19  8:07 PM   Specimen: BLOOD   Result Value Ref Range Status   Specimen Description BLOOD LEFT ANTECUBITAL  Final   Special Requests   Final    BOTTLES DRAWN AEROBIC AND ANAEROBIC Blood Culture adequate volume   Culture   Final    NO GROWTH 4 DAYS Performed at [facility], [address], [place], [place] [address]    Report Status PENDING  Incomplete  Blood Culture (routine x 2)     Status: None (Preliminary result)   Collection Time: 11/25/19  8:12 PM   Specimen: BLOOD  Result Value Ref Range Status   Specimen Description BLOOD BLOOD RIGHT WRIST  Final   Special Requests   Final    BOTTLES DRAWN AEROBIC AND ANAEROBIC Blood Culture adequate volume   Culture   Final    NO GROWTH 4 DAYS Performed at [facility], [address] [place], [place] [address]    Report Status PENDING  Incomplete  Respiratory Panel by RT PCR (Flu A&B, Covid) - Nasopharyngeal Swab     Status: None   Collection Time: 11/25/19  8:50 PM   Specimen: Nasopharyngeal Swab  Result Value Ref Range Status   SARS Coronavirus 2 by RT PCR NEGATIVE NEGATIVE Final    Comment: (NOTE) SARS-CoV-2 target nucleic acids are NOT DETECTED. The SARS-CoV-2 RNA is generally detectable in upper respiratoy specimens during the acute phase of infection. The lowest concentration of SARS-CoV-2 viral copies this assay can detect is 131 copies/mL. A negative result does not preclude SARS-Cov-2 infection and should not be used as the sole basis for treatment or other patient management decisions. A negative result may occur with  improper specimen collection/handling, submission of specimen other than nasopharyngeal swab, presence of viral mutation(s) within the areas targeted by this assay, and inadequate number of viral copies (<131 copies/mL). A negative result must be combined with clinical observations, patient history, and epidemiological information. The expected result is Negative. Fact Sheet for Patients:   PinkCheek.be Fact Sheet for Healthcare Providers:  GravelBags.it This test is not yet ap proved or cleared by the [place] FDA and  has been authorized for detection and/or diagnosis of SARS-CoV-2 by  FDA under an Emergency Use Authorization (EUA). This EUA will remain  in effect (meaning this test can be used) for the duration of the COVID-19 declaration under Section 564(b)(1) of the Act, 21 U.S.C. section 360bbb-3(b)(1), unless the authorization is terminated or revoked sooner.    Influenza A by PCR NEGATIVE NEGATIVE Final   Influenza B by PCR NEGATIVE NEGATIVE Final    Comment: (NOTE) The Xpert Xpress SARS-CoV-2/FLU/RSV assay is intended as an aid in  the diagnosis of influenza from Nasopharyngeal swab specimens and  should not be used as a sole basis for treatment. Nasal washings and  aspirates are unacceptable for Xpert Xpress SARS-CoV-2/FLU/RSV  testing. Fact Sheet for Patients: PinkCheek.be Fact Sheet for Healthcare Providers: GravelBags.it This test is not yet approved or cleared by the [place] FDA and  has been authorized for detection and/or diagnosis of SARS-CoV-2 by  FDA under an Emergency Use Authorization (EUA). This EUA will remain  in effect (meaning this test can be used) for the duration of the  Covid-19 declaration under Section 564(b)(1) of the Act, 21  U.S.C. section 360bbb-3(b)(1), unless the authorization is  terminated or revoked. Performed at [facility], [address] [place], [place] [address]   MRSA PCR Screening     Status: Abnormal   Collection Time: 11/26/19  6:47 PM   Specimen: Nasopharyngeal  Result Value Ref Range Status   MRSA by PCR POSITIVE (A) NEGATIVE Final    Comment:        The GeneXpert MRSA Assay (FDA approved for NASAL specimens only), is one component of a comprehensive MRSA  colonization surveillance program. It is not intended to diagnose MRSA infection nor to guide or monitor treatment for MRSA infections. RESULT CALLED TO, READ BACK BY AND VERIFIED WITH: [facility] 11/26/19 @ 2016  [place] Performed at [facility], [address] [place], [place] [address]      Total time spend on discharging this patient, including the last patient exam, discussing the hospital stay, instructions for ongoing care as it relates to all pertinent caregivers, as well as preparing the medical discharge records, prescriptions, and/or referrals as applicable, is 40 minutes.    Enzo Bi, MD  [facility] 11/29/2019, 12:12 PM  If 7PM-7AM, please contact night-coverage

## 2019-11-29 NOTE — [facility] Notes (Addendum)
LCSW contacted for taxi voucher for transportation at discharge back to [facility]. Spoke with Tabitha at [facility] who stated patient could return to facility today and they did not need RN to call report. Facility will need D/C summary. Patient able to walk and does not have any money, per [employer]. Taxi voucher given to [employer]. No further needs assessed.   Oretha Ellis, LCSW Transitions of Care [phone]

## 2019-11-30 LAB — CULTURE, BLOOD (ROUTINE X 2)
Culture: NO GROWTH
Culture: NO GROWTH
Special Requests: ADEQUATE
Special Requests: ADEQUATE

## 2020-01-21 NOTE — Progress Notes (Signed)
01/22/2020 8:22 PM  Dakota Harper 07/31/1946 [id number]  Referring provider: Jan[facility] [place],  [place] [address]  Chief Complaint  Patient presents with  . Elevated PSA   HPI: Dakota Harper is a 74 y.o. male with a history of nephrolithiasis, benign prostatic hypertrophy, urinary frequency and nocturia who is referred by Odessa Fleming, FNP-C for elevated PSA.   History of nephrolithiasis Prior CT scan on 03/2016 showed a left distal uretal stone and repeat CT scan on 06/2016 showed no stone or hydronephrosis.   BPH WITH LUTS  (prostate and/or bladder) IPSS score: 27/6    PVR: 90 mL  Previous score: 22/3  Previous PVR: 0 mL  Major complaint(s): Frequency, incontinence, hesitancy and urgency x 10 months. Denies any dysuria, hematuria or suprapubic pain.   Currently taking: Myrbetriq 50 mg daily and tamsulosin 0.4 mg daily   His has had an elevated PSA at 8.19 in 01/2020.     Denies any recent fevers, chills, nausea or vomiting.  He does not have a family history of PCa.  IPSS    Row Name 01/22/20 1400         International Prostate Symptom Score   How often have you had the sensation of not emptying your bladder?  Less than half the time     How often have you had to urinate less than every two hours?  Almost always     How often have you found you stopped and started again several times when you urinated?  Less than half the time     How often have you found it difficult to postpone urination?  Almost always     How often have you had a weak urinary stream?  More than half the time     How often have you had to strain to start urination?  More than half the time     How many times did you typically get up at night to urinate?  5 Times     Total IPSS Score  27       Quality of Life due to urinary symptoms   If you were to spend the rest of your life with your urinary condition just the way it is now how would you feel about that?   Terrible        Score:  1-7 Mild 8-19 Moderate 20-35 Severe   Background history: He was seen on 09/17/2018 by Dr. Matilde Sprang who noted the patient has medical comorbidities with frequency and nocturia. . At that time he was on Avodart and Flomax and interested in stopping Avodart since he did not feel it was an effective form of treatment for his symptoms. He had a CT scan in May 2017 that demonstrated an enlarged prostate and a distal left ureteral stone. Patient was interested in Myrbetriq but wasn't started.    PMH: Past Medical History:  Diagnosis Date  . Asthma   . BPH (benign prostatic hyperplasia)   . Ear pain   . Hypercholesteremia   . Hypertension   . Neuropathy   . Sleep disorder    Surgical History: Past Surgical History:  Procedure Laterality Date  . CHOLECYSTECTOMY    . HERNIA REPAIR     Home Medications:  Allergies as of 01/22/2020      Reactions   Saw Palmetto Hives, Rash   Sulfa Antibiotics Rash   Sulfacetamide Sodium Rash      Medication List  Accurate as of January 22, 2020 11:59 PM. If you have any questions, ask your nurse or doctor.        acetaminophen 500 MG tablet Commonly known as: TYLENOL Take 500 mg by mouth every 4 (four) hours as needed for mild pain.   albuterol 108 (90 Base) MCG/ACT inhaler Commonly known as: VENTOLIN HFA Inhale 1 puff into the lungs 3 (three) times daily.   aspirin EC 81 MG tablet Take 81 mg by mouth at bedtime.   barrier cream Crea Commonly known as: non-specified Apply 1 application topically as needed (post-toileting skin protection).   cycloSPORINE 0.05 % ophthalmic emulsion Commonly known as: RESTASIS Place 1 drop into both eyes 2 (two) times daily as needed (dry eyes).   fenofibrate 145 MG tablet Commonly known as: TRICOR Take 145 mg by mouth at bedtime.   finasteride 5 MG tablet Commonly known as: PROSCAR Take 1 tablet (5 mg total) by mouth daily. Started by: Zara Council, PA-C     fluocinonide ointment 0.05 % Commonly known as: LIDEX Apply 1 application topically 2 (two) times daily as needed (pruritus).   fluticasone-salmeterol 115-21 MCG/ACT inhaler Commonly known as: ADVAIR HFA Inhale 2 puffs into the lungs 2 (two) times daily.   gabapentin 100 MG capsule Commonly known as: NEURONTIN Take 200 mg by mouth at bedtime. (take with 600mg  tablet for equal 800mg )   gabapentin 600 MG tablet Commonly known as: NEURONTIN Take 600 mg by mouth at bedtime. (take with 100mg  capsules to equal 800mg )   guaiFENesin 100 MG/5ML liquid Commonly known as: ROBITUSSIN Take 300 mg by mouth every 6 (six) hours as needed for cough.   ibuprofen 400 MG tablet Commonly known as: ADVIL Take 400 mg by mouth 3 (three) times daily as needed for mild pain or moderate pain.   levothyroxine 50 MCG tablet Commonly known as: SYNTHROID Take 50 mcg daily before breakfast by mouth.   loperamide 2 MG capsule Commonly known as: IMODIUM Take 4 mg by mouth as needed for diarrhea or loose stools ((max 8 doses in 24 hours)).   losartan 25 MG tablet Commonly known as: COZAAR Take 25 mg by mouth daily.   magnesium hydroxide 400 MG/5ML suspension Commonly known as: MILK OF MAGNESIA Take 30 mLs by mouth daily as needed for mild constipation or moderate constipation.   Mintox [id number] MG/5ML suspension Generic drug: alum & mag hydroxide-simeth Take 30 mLs by mouth 4 (four) times daily as needed for indigestion or heartburn.   montelukast 5 MG chewable tablet Commonly known as: SINGULAIR Chew 5 mg by mouth at bedtime.   Multi-Vitamins Tabs Take 1 tablet by mouth daily.   Myrbetriq 50 MG Tb24 tablet Generic drug: mirabegron ER Take 50 mg by mouth daily.   nortriptyline 75 MG capsule Commonly known as: PAMELOR Take 150 mg by mouth daily.   Peridex 0.12 % solution Generic drug: chlorhexidine Use as directed 15 mLs in the mouth or throat 2 (two) times daily as needed (oral  discomfort).   polyethylene glycol 17 g packet Commonly known as: MIRALAX / GLYCOLAX Take 17 g by mouth daily as needed for mild constipation or moderate constipation.   tamsulosin 0.4 MG Caps capsule Commonly known as: FLOMAX Take 0.4 mg by mouth at bedtime.      Allergies:  Allergies  Allergen Reactions  . Saw Palmetto Hives and Rash  . Sulfa Antibiotics Rash  . Sulfacetamide Sodium Rash   Family History: Family History  Problem Relation Age of Onset  .  Prostate cancer Neg Hx   . Bladder Cancer Neg Hx   . Kidney cancer Neg Hx    Social History:  reports that he has never smoked. He has never used smokeless tobacco. He reports that he does not drink alcohol or use drugs.  ROS: For pertinent review of systems please refer to history of present illness  Physical Exam: BP 112/62   Pulse 72   Ht 5\' 9"  (1.753 m)   Wt 200 lb (90.7 kg)   BMI 29.53 kg/m   Constitutional:  Well nourished. Alert and oriented, No acute distress. HEENT: [place] AT, mask in place.  Trachea midline, no masses. Cardiovascular: No clubbing, cyanosis, or edema. Respiratory: Normal respiratory effort, no increased work of breathing. GI: Abdomen is soft, non tender, non distended, no abdominal masses.  GU: No CVA tenderness.  No bladder fullness or masses.  Patient with circumcised phallus.   Urethral meatus is patent.  No penile discharge. No penile lesions or rashes. Scrotum without lesions, cysts, rashes and/or edema.  Testicles are located scrotally bilaterally. No masses are appreciated in the testicles. Left and right epididymis are normal. Rectal: Patient with  normal sphincter tone. Anus and perineum without scarring or rashes. No rectal masses are appreciated. Prostate is approximately 60 grams, could only palpate the apex and the midportion of the gland, no nodules are appreciated. Seminal vesicles could not be palpated.  Skin: No rashes, bruises or suspicious lesions. Lymph: No inguinal  adenopathy. Neurologic: Grossly intact, no focal deficits, moving all 4 extremities. Psychiatric: Normal mood and affect.   Laboratory Data: PSA trend  3.9 in January 2012  4.1 in May 2012  4.8 in September 2012  4.7 in September 2013  5.2 in October 2014  5.3 in May 2015  5.78 in May 2016  4.7 in June 2017   8.19 in March 2021    Lab Results  Component Value Date   WBC 13.2 (H) 11/29/2019   HGB 11.2 (L) 11/29/2019   HCT 33.1 (L) 11/29/2019   MCV 87.1 11/29/2019   PLT 437 (H) 11/29/2019   Lab Results  Component Value Date   CREATININE 1.19 11/29/2019    Lab Results  Component Value Date   AST 27 11/25/2019   Lab Results  Component Value Date   ALT 21 11/25/2019    Pertinent Imaging: Results for DETWAN, WANT (MRN [id number]) as of 02/07/2020 20:57  Ref. Range 01/22/2020 15:05  Scan Result Unknown 90    Assessment & Plan:    1. Elevated PSA Discussed with the patient that PSA is an acronym for  prostate specific antigen,  which is a protein made by the prostate gland and can be detected in the blood stream. I explained to the patient situations that would increase the PSA, such as: a man's age,  BPH, infection, recent intercourse/ejaculation, prostate infarction, recent urethroscopic manipulation (Foley placement/cystoscopy) and prostate cancer.  At this time, I have advised the patient that we will repeat the PSA If PSA returns lower than 8, we will continue to trend to ensure a downward trajectory If PSA returns elevated, we will have further discussion and consider a prostate MRI   2. Benign Prostatic Hypertrophy IPSS score is 27/6, it is worsening Continue conservative management, avoiding bladder irritants and timed voiding's Most bothersome symptoms is/are frequency, urgency and hesitancy Continue tamsulosin 0.4 mg daily and add finasteride 5 mg daily RTC in pending PSA  3. Frequency Continue the Myrbetriq 25 mg; refills given  Laneta Simmers  [facility] [address]  [place], [place] [address] [phone]  I, Stacie Glaze, am acting as a Education administrator for [employer], PA-C.  I have reviewed the above documentation for accuracy and completeness, and I agree with the above.    Zara Council, PA-C

## 2020-01-22 ENCOUNTER — Encounter: Payer: Self-pay | Admitting: Urology

## 2020-01-22 ENCOUNTER — Other Ambulatory Visit: Payer: Self-pay | Admitting: Family Medicine

## 2020-01-22 ENCOUNTER — Other Ambulatory Visit: Payer: Self-pay

## 2020-01-22 ENCOUNTER — Ambulatory Visit (INDEPENDENT_AMBULATORY_CARE_PROVIDER_SITE_OTHER): Payer: 59 | Admitting: Urology

## 2020-01-22 VITALS — BP 112/62 | HR 72 | Ht 69.0 in | Wt 200.0 lb

## 2020-01-22 DIAGNOSIS — N400 Enlarged prostate without lower urinary tract symptoms: Secondary | ICD-10-CM | POA: Diagnosis not present

## 2020-01-22 DIAGNOSIS — R972 Elevated prostate specific antigen [PSA]: Secondary | ICD-10-CM

## 2020-01-22 DIAGNOSIS — R350 Frequency of micturition: Secondary | ICD-10-CM

## 2020-01-22 LAB — BLADDER SCAN AMB NON-IMAGING: Scan Result: 90

## 2020-01-22 MED ORDER — FINASTERIDE 5 MG PO TABS: 5.0000 mg | ORAL_TABLET | Freq: Every day | ORAL | 3 refills | Status: AC

## 2020-01-23 ENCOUNTER — Other Ambulatory Visit: Payer: Self-pay | Admitting: Urology

## 2020-01-23 ENCOUNTER — Telehealth: Payer: Self-pay | Admitting: *Deleted

## 2020-01-23 LAB — PSA: Prostate Specific Ag, Serum: 18.2 ng/mL — ABNORMAL HIGH (ref 0.0–4.0)

## 2020-01-23 MED ORDER — CIPROFLOXACIN HCL 250 MG PO TABS: 250.0000 mg | ORAL_TABLET | Freq: Two times a day (BID) | ORAL | 0 refills | Status: DC

## 2020-01-23 NOTE — Telephone Encounter (Signed)
-----   Message from Nori Riis, PA-C sent at 01/23/2020  8:45 AM EDT ----- I have spoken to Mr. Shanholtz regarding his PSA results and have recommended 30 days of Cipro 250 mg BID x 30 days.  We need to fax the script to [phone].  We also need to call Varney Biles at [phone] to schedule a repeat PSA in one month.

## 2020-01-23 NOTE — Telephone Encounter (Signed)
I called and scheduled his appt for the lab 02/23/2020 10.00am (psa)

## 2020-02-23 ENCOUNTER — Other Ambulatory Visit: Payer: 59

## 2020-02-23 ENCOUNTER — Other Ambulatory Visit: Payer: Self-pay

## 2020-02-23 DIAGNOSIS — N400 Enlarged prostate without lower urinary tract symptoms: Secondary | ICD-10-CM

## 2020-02-24 ENCOUNTER — Telehealth: Payer: Self-pay | Admitting: Family Medicine

## 2020-02-24 LAB — PSA: Prostate Specific Ag, Serum: 8.6 ng/mL — ABNORMAL HIGH (ref 0.0–4.0)

## 2020-02-24 NOTE — Telephone Encounter (Signed)
-----   Message from Nori Riis, PA-C sent at 02/24/2020 12:48 PM EDT ----- Please let Mr. Binda know that his PSA has reduced by almost half.  I would like it repeated again in one month.

## 2020-02-24 NOTE — Telephone Encounter (Signed)
Patient notified and voiced understanding, appointment has been made.

## 2020-03-25 ENCOUNTER — Other Ambulatory Visit: Payer: Self-pay

## 2020-03-25 DIAGNOSIS — R972 Elevated prostate specific antigen [PSA]: Secondary | ICD-10-CM

## 2020-03-26 ENCOUNTER — Other Ambulatory Visit: Payer: 59

## 2020-03-26 ENCOUNTER — Other Ambulatory Visit: Payer: Self-pay

## 2020-03-26 DIAGNOSIS — R972 Elevated prostate specific antigen [PSA]: Secondary | ICD-10-CM

## 2020-03-27 LAB — PSA: Prostate Specific Ag, Serum: 7.5 ng/mL — ABNORMAL HIGH (ref 0.0–4.0)

## 2020-03-29 ENCOUNTER — Telehealth: Payer: Self-pay | Admitting: Family Medicine

## 2020-03-29 NOTE — Telephone Encounter (Signed)
-----   Message from Nori Riis, PA-C sent at 03/28/2020  7:55 AM EDT ----- Please let Dakota Harper know that his PSA continues to trend down.  I would like it repeated again in one month.

## 2020-03-29 NOTE — Telephone Encounter (Signed)
Patient's nurse at [facility] has been notified and a message was left for an appointment to be made for PSA blood draw only in 1 month.

## 2020-03-31 NOTE — Telephone Encounter (Signed)
Patient called to discuss his PSA results. I gave the result to patient with the note that he needs to come back in in 1 month for another PSA draw to make sure the lab continues to trend down. Patient states he does not think it is necessary to come back in in 1 month. He has a lab appointment and a office visit in September. He states he will just come to those appointments.

## 2020-04-06 NOTE — Telephone Encounter (Signed)
Patient notified and voiced understanding.

## 2020-04-06 NOTE — Telephone Encounter (Signed)
As long as Mr. Reicks understands that an elevated PSA may signify prostate cancer and he may delay diagnosis and treatment by waiting until September.

## 2020-07-19 ENCOUNTER — Other Ambulatory Visit: Payer: Self-pay

## 2020-07-27 ENCOUNTER — Ambulatory Visit: Payer: Self-pay | Admitting: Urology

## 2020-08-16 ENCOUNTER — Telehealth: Payer: Self-pay | Admitting: Urology

## 2020-08-16 NOTE — Telephone Encounter (Signed)
Dakota Harper had indicated that he wanted to wait until September to repeat his PSA.  It is now October.  Would you call him and get him scheduled for a lab appointment for a PSA?

## 2020-08-24 ENCOUNTER — Other Ambulatory Visit: Payer: 59

## 2020-08-26 ENCOUNTER — Ambulatory Visit: Payer: 59 | Admitting: Urology

## 2020-09-23 ENCOUNTER — Telehealth: Payer: Self-pay | Admitting: Urology

## 2020-09-23 NOTE — Telephone Encounter (Signed)
Dakota Harper had indicated that he wanted to wait until September to repeat his PSA.  It is now November.  Would you call him and get him scheduled for a lab appointment for a PSA?

## 2020-09-24 NOTE — Telephone Encounter (Signed)
LMOM for [place] to return call to reschedule lab appt and f/u visit with Larene Beach.

## 2020-10-15 ENCOUNTER — Other Ambulatory Visit: Payer: Self-pay

## 2020-10-15 ENCOUNTER — Other Ambulatory Visit
Admission: RE | Admit: 2020-10-15 | Discharge: 2020-10-15 | Disposition: A | Discharge status: Home / Self Care | Payer: 59 | Source: Ambulatory Visit | Attending: Internal Medicine | Admitting: Internal Medicine

## 2020-10-15 DIAGNOSIS — Z01812 Encounter for preprocedural laboratory examination: Secondary | ICD-10-CM | POA: Diagnosis present

## 2020-10-15 DIAGNOSIS — Z20822 Contact with and (suspected) exposure to covid-19: Secondary | ICD-10-CM | POA: Insufficient documentation

## 2020-10-16 LAB — SARS CORONAVIRUS 2 (TAT 6-24 HRS): SARS Coronavirus 2: NEGATIVE

## 2020-10-18 ENCOUNTER — Encounter
Admission: RE | Disposition: A | Discharge status: Home / Self Care | Payer: Self-pay | Source: Ambulatory Visit | Attending: Internal Medicine

## 2020-10-18 ENCOUNTER — Other Ambulatory Visit: Payer: Self-pay

## 2020-10-18 ENCOUNTER — Encounter: Payer: Self-pay | Admitting: Internal Medicine

## 2020-10-18 ENCOUNTER — Encounter: Payer: Self-pay | Admitting: Certified Registered Nurse Anesthetist

## 2020-10-18 ENCOUNTER — Ambulatory Visit
Admission: RE | Admit: 2020-10-18 | Discharge: 2020-10-18 | Disposition: A | Discharge status: Home / Self Care | Payer: 59 | Source: Ambulatory Visit | Attending: Internal Medicine | Admitting: Internal Medicine

## 2020-10-18 DIAGNOSIS — Z86010 Personal history of colonic polyps: Secondary | ICD-10-CM | POA: Diagnosis present

## 2020-10-18 DIAGNOSIS — Z1211 Encounter for screening for malignant neoplasm of colon: Secondary | ICD-10-CM | POA: Insufficient documentation

## 2020-10-18 DIAGNOSIS — Z538 Procedure and treatment not carried out for other reasons: Secondary | ICD-10-CM | POA: Insufficient documentation

## 2020-10-18 SURGERY — COLONOSCOPY WITH PROPOFOL
Anesthesia: General

## 2020-10-18 MED ORDER — PROPOFOL 500 MG/50ML IV EMUL
INTRAVENOUS | Status: AC
  Filled 2020-10-18: qty 100

## 2020-10-18 MED ORDER — SODIUM CHLORIDE 0.9 % IV SOLN: INTRAVENOUS | Status: DC

## 2020-10-18 NOTE — Interval H&P Note (Signed)
History and Physical Interval Note:  10/18/2020 9:37 AM  Dakota Harper  has presented today for surgery, with the diagnosis of hx of adenomatous colonic polyps.  The various methods of treatment have been discussed with the patient and family. After consideration of risks, benefits and other options for treatment, the patient has consented to  Procedure(s) with comments: COLONOSCOPY WITH PROPOFOL (N/A) - C-19 TEST ON 12/10 as a surgical intervention.  The patient's history has been reviewed, patient examined, no change in status, stable for surgery.  I have reviewed the patient's chart and labs.  Questions were answered to the patient's satisfaction.     [place], [place]

## 2020-10-18 NOTE — H&P (Signed)
Outpatient short stay form Pre-procedure 10/18/2020 9:35 AM Dakota Harper K. Alice Reichert, M.D.  Primary Physician: [facility]  Reason for visit:  Personal history of adenomatous colon polyps  History of present illness:                           Patient presents for colonoscopy for a personal hx of colon polyps. The patient denies abdominal pain, abnormal weight loss or rectal bleeding.      Current Facility-Administered Medications:  .  0.9 %  sodium chloride infusion, , Intravenous, Continuous, Kaylianna Detert, Benay Pike, MD  Medications Prior to Admission  Medication Sig Dispense Refill Last Dose  . aspirin EC 81 MG tablet Take 81 mg by mouth at bedtime.    10/17/2020 at Unknown time  . atorvastatin (LIPITOR) 10 MG tablet Take 10 mg by mouth daily.   10/17/2020 at Unknown time  . chlorhexidine (PERIDEX) 0.12 % solution Use as directed 15 mLs in the mouth or throat 2 (two) times daily as needed (oral discomfort).   10/17/2020 at Unknown time  . cycloSPORINE (RESTASIS) 0.05 % ophthalmic emulsion Place 1 drop into both eyes 2 (two) times daily as needed (dry eyes).   10/17/2020 at Unknown time  . finasteride (PROSCAR) 5 MG tablet Take 1 tablet (5 mg total) by mouth daily. 90 tablet 3 10/17/2020 at Unknown time  . gabapentin (NEURONTIN) 100 MG capsule Take 200 mg by mouth at bedtime. (take with 600mg  tablet for equal 800mg )   10/17/2020 at Unknown time  . gabapentin (NEURONTIN) 600 MG tablet Take 600 mg by mouth at bedtime. (take with 100mg  capsules to equal 800mg )   10/17/2020 at Unknown time  . levothyroxine (SYNTHROID, LEVOTHROID) 50 MCG tablet Take 50 mcg daily before breakfast by mouth.   Past Week at Unknown time  . loperamide (IMODIUM) 2 MG capsule Take 4 mg by mouth as needed for diarrhea or loose stools ((max 8 doses in 24 hours)).   10/17/2020 at Unknown time  . losartan (COZAAR) 25 MG tablet Take 25 mg by mouth daily.   10/17/2020 at Unknown time  . magnesium hydroxide (MILK OF  MAGNESIA) 400 MG/5ML suspension Take 30 mLs by mouth daily as needed for mild constipation or moderate constipation.   Past Week at Unknown time  . mirabegron ER (MYRBETRIQ) 50 MG TB24 tablet Take 50 mg by mouth daily.   10/17/2020 at Unknown time  . montelukast (SINGULAIR) 5 MG chewable tablet Chew 5 mg by mouth at bedtime.   10/17/2020 at Unknown time  . Multiple Vitamin (MULTI-VITAMINS) TABS Take 1 tablet by mouth daily.    10/17/2020 at Unknown time  . nortriptyline (PAMELOR) 75 MG capsule Take 150 mg by mouth daily.    10/17/2020 at Unknown time  . omeprazole (PRILOSEC) 10 MG capsule Take 10 mg by mouth daily.   10/17/2020 at Unknown time  . polyethylene glycol (MIRALAX / GLYCOLAX) 17 g packet Take 17 g by mouth daily as needed for mild constipation or moderate constipation.   10/17/2020 at Unknown time  . tamsulosin (FLOMAX) 0.4 MG CAPS capsule Take 0.4 mg by mouth at bedtime.   10/17/2020 at Unknown time  . tiZANidine (ZANAFLEX) 2 MG tablet Take by mouth every 6 (six) hours as needed for muscle spasms.   10/17/2020 at Unknown time  . acetaminophen (TYLENOL) 500 MG tablet Take 500 mg by mouth every 4 (four) hours as needed for mild pain.  at prn  . albuterol (PROVENTIL HFA;VENTOLIN HFA) 108 (90 BASE) MCG/ACT inhaler Inhale 1 puff into the lungs 3 (three) times daily.    at prn  . alum & mag hydroxide-simeth (MAALOX/MYLANTA) 200-200-20 MG/5ML suspension Take 30 mLs by mouth 4 (four) times daily as needed for indigestion or heartburn.    at prn  . barrier cream (NON-SPECIFIED) CREA Apply 1 application topically as needed (post-toileting skin protection).    at prn  . ciprofloxacin (CIPRO) 250 MG tablet Take 1 tablet (250 mg total) by mouth 2 (two) times daily. (Patient not taking: Reported on 10/18/2020) 60 tablet 0 Completed Course at Unknown time  . famotidine (PEPCID) 40 MG tablet Take 40 mg by mouth daily. (Patient not taking: Reported on 10/18/2020)   Not Taking at Unknown time  .  fenofibrate (TRICOR) 145 MG tablet Take 145 mg by mouth at bedtime.  (Patient not taking: Reported on 10/18/2020)   Not Taking at Unknown time  . fluocinonide ointment (LIDEX) 2.33 % Apply 1 application topically 2 (two) times daily as needed (pruritus).     at prn  . fluticasone-salmeterol (ADVAIR HFA) 115-21 MCG/ACT inhaler Inhale 2 puffs into the lungs 2 (two) times daily. (Patient not taking: Reported on 10/18/2020)   Not Taking at Unknown time  . guaiFENesin (ROBITUSSIN) 100 MG/5ML liquid Take 300 mg by mouth every 6 (six) hours as needed for cough.    at prn  . ibuprofen (ADVIL) 400 MG tablet Take 400 mg by mouth 3 (three) times daily as needed for mild pain or moderate pain.    at prn     Allergies  Allergen Reactions  . Saw Palmetto Hives and Rash  . Sulfa Antibiotics Rash  . Sulfacetamide Sodium Rash     Past Medical History:  Diagnosis Date  . Asthma   . BPH (benign prostatic hyperplasia)   . Ear pain   . Hypercholesteremia   . Hypertension   . Neuropathy   . Sleep disorder     Review of systems:  Otherwise negative.    Physical Exam  Gen: Alert, oriented. Appears stated age.  HEENT: [place]/AT. PERRLA. Lungs: CTA, no wheezes. CV: RR nl S1, S2. Abd: soft, benign, no masses. BS+ Ext: No edema. Pulses 2+    Planned procedures: Proceed with colonoscopy. The patient understands the nature of the planned procedure, indications, risks, alternatives and potential complications including but not limited to bleeding, infection, perforation, damage to internal organs and possible oversedation/side effects from anesthesia. The patient agrees and gives consent to proceed.  Please refer to procedure notes for findings, recommendations and patient disposition/instructions.     Dakota Harper K. Alice Reichert, M.D. Gastroenterology 10/18/2020  9:35 AM

## 2020-10-18 NOTE — Progress Notes (Signed)
Patient cancelled due to poor prep in pre procedure

## 2020-10-18 NOTE — H&P (Signed)
After discussing with patient, he did not prep his colon adequately. He took Imodium "because I was having too much diarrhea". I explained to the patient that the prep intentionally causes diarrhea. Patient reports he has "brown liquid with pieces of stool" at his last bowel movement. I decided to cancel and reschedule the procedure due to likely suboptimal bowel preparation. Patient acknowledged understanding and will be rescheduled by my office.  Dakota Harper, M.D. ABIM Diplomate in Gastroenterology [place]

## 2020-10-25 ENCOUNTER — Other Ambulatory Visit: Payer: Self-pay

## 2020-10-25 ENCOUNTER — Ambulatory Visit (INDEPENDENT_AMBULATORY_CARE_PROVIDER_SITE_OTHER): Payer: 59 | Admitting: Podiatry

## 2020-10-25 ENCOUNTER — Encounter: Payer: Self-pay | Admitting: Podiatry

## 2020-10-25 DIAGNOSIS — N1832 Chronic kidney disease, stage 3b: Secondary | ICD-10-CM

## 2020-10-25 DIAGNOSIS — L600 Ingrowing nail: Secondary | ICD-10-CM

## 2020-10-25 NOTE — Progress Notes (Signed)
This patient presents to the office stating he has pain for the last 2 weeks due to ingrowing nails both big toes.   He previously had nail surgery on both great toes.  He presents to the office for evaluation and treatment.  He denies any drainage or infection.   Vascular  Dorsalis pedis and posterior tibial pulses are weakly  palpable  B/L.  Capillary return  WNL.  Temperature gradient is  WNL.  Skin turgor  WNL  Sensorium  Senn Weinstein monofilament wire  WNL. Normal tactile sensation.  Nail Exam  Patient has normal nails with no evidence of bacterial or fungal infection. Marked incurvation medial and lateral borders both hallux toes.  No signs of redness or infection noted.  Orthopedic  Exam  Muscle tone and muscle strength  WNL.  No limitations of motion feet  B/L.  No crepitus or joint effusion noted.  Foot type is unremarkable and digits show no abnormalities.  Bony prominences are unremarkable.  Skin  No open lesions.  Normal skin texture and turgor    Incurvated/Ingrown toenails Hallux  B/L.  Debride hallux toenails with nail nipper followed by dremel tool.   Gardiner Barefoot DPM

## 2020-10-27 ENCOUNTER — Inpatient Hospital Stay
Admission: EM | Admit: 2020-10-27 | Discharge: 2020-11-01 | DRG: 871 | Disposition: A | Discharge status: Skilled Nursing Facility | Payer: 59 | Source: Skilled Nursing Facility | Attending: Internal Medicine | Admitting: Internal Medicine

## 2020-10-27 ENCOUNTER — Other Ambulatory Visit: Payer: Self-pay

## 2020-10-27 ENCOUNTER — Emergency Department: Payer: 59

## 2020-10-27 DIAGNOSIS — R6521 Severe sepsis with septic shock: Secondary | ICD-10-CM | POA: Diagnosis present

## 2020-10-27 DIAGNOSIS — Z888 Allergy status to other drugs, medicaments and biological substances status: Secondary | ICD-10-CM

## 2020-10-27 DIAGNOSIS — E039 Hypothyroidism, unspecified: Secondary | ICD-10-CM | POA: Diagnosis present

## 2020-10-27 DIAGNOSIS — N170 Acute kidney failure with tubular necrosis: Secondary | ICD-10-CM | POA: Diagnosis present

## 2020-10-27 DIAGNOSIS — Z882 Allergy status to sulfonamides status: Secondary | ICD-10-CM

## 2020-10-27 DIAGNOSIS — N400 Enlarged prostate without lower urinary tract symptoms: Secondary | ICD-10-CM | POA: Diagnosis present

## 2020-10-27 DIAGNOSIS — J4489 Other specified chronic obstructive pulmonary disease: Secondary | ICD-10-CM | POA: Diagnosis present

## 2020-10-27 DIAGNOSIS — R6520 Severe sepsis without septic shock: Secondary | ICD-10-CM | POA: Diagnosis present

## 2020-10-27 DIAGNOSIS — J449 Chronic obstructive pulmonary disease, unspecified: Secondary | ICD-10-CM | POA: Diagnosis present

## 2020-10-27 DIAGNOSIS — G479 Sleep disorder, unspecified: Secondary | ICD-10-CM | POA: Diagnosis present

## 2020-10-27 DIAGNOSIS — E7800 Pure hypercholesterolemia, unspecified: Secondary | ICD-10-CM | POA: Diagnosis present

## 2020-10-27 DIAGNOSIS — Z79890 Hormone replacement therapy (postmenopausal): Secondary | ICD-10-CM

## 2020-10-27 DIAGNOSIS — Z7982 Long term (current) use of aspirin: Secondary | ICD-10-CM

## 2020-10-27 DIAGNOSIS — J189 Pneumonia, unspecified organism: Secondary | ICD-10-CM | POA: Diagnosis present

## 2020-10-27 DIAGNOSIS — A419 Sepsis, unspecified organism: Principal | ICD-10-CM | POA: Diagnosis present

## 2020-10-27 DIAGNOSIS — N183 Chronic kidney disease, stage 3 unspecified: Secondary | ICD-10-CM | POA: Diagnosis present

## 2020-10-27 DIAGNOSIS — N179 Acute kidney failure, unspecified: Secondary | ICD-10-CM | POA: Diagnosis present

## 2020-10-27 DIAGNOSIS — Z9049 Acquired absence of other specified parts of digestive tract: Secondary | ICD-10-CM | POA: Diagnosis not present

## 2020-10-27 DIAGNOSIS — Z79899 Other long term (current) drug therapy: Secondary | ICD-10-CM

## 2020-10-27 DIAGNOSIS — Z7952 Long term (current) use of systemic steroids: Secondary | ICD-10-CM | POA: Diagnosis not present

## 2020-10-27 DIAGNOSIS — J440 Chronic obstructive pulmonary disease with acute lower respiratory infection: Secondary | ICD-10-CM | POA: Diagnosis present

## 2020-10-27 DIAGNOSIS — Z7951 Long term (current) use of inhaled steroids: Secondary | ICD-10-CM

## 2020-10-27 DIAGNOSIS — I129 Hypertensive chronic kidney disease with stage 1 through stage 4 chronic kidney disease, or unspecified chronic kidney disease: Secondary | ICD-10-CM | POA: Diagnosis present

## 2020-10-27 DIAGNOSIS — L600 Ingrowing nail: Secondary | ICD-10-CM | POA: Diagnosis present

## 2020-10-27 DIAGNOSIS — Z20822 Contact with and (suspected) exposure to covid-19: Secondary | ICD-10-CM | POA: Diagnosis present

## 2020-10-27 HISTORY — DX: Acute kidney failure, unspecified: N17.9

## 2020-10-27 LAB — COMPREHENSIVE METABOLIC PANEL
ALT: 19 U/L (ref 0–44)
AST: 28 U/L (ref 15–41)
Albumin: 2.9 g/dL — ABNORMAL LOW (ref 3.5–5.0)
Alkaline Phosphatase: 78 U/L (ref 38–126)
Anion gap: 13 (ref 5–15)
BUN: 26 mg/dL — ABNORMAL HIGH (ref 8–23)
CO2: 20 mmol/L — ABNORMAL LOW (ref 22–32)
Calcium: 8.6 mg/dL — ABNORMAL LOW (ref 8.9–10.3)
Chloride: 104 mmol/L (ref 98–111)
Creatinine, Ser: 2.39 mg/dL — ABNORMAL HIGH (ref 0.61–1.24)
GFR, Estimated: 28 mL/min — ABNORMAL LOW (ref >60)
Glucose, Bld: 126 mg/dL — ABNORMAL HIGH (ref 70–99)
Potassium: 4.3 mmol/L (ref 3.5–5.1)
Sodium: 137 mmol/L (ref 135–145)
Total Bilirubin: 1.8 mg/dL — ABNORMAL HIGH (ref 0.3–1.2)
Total Protein: 6.2 g/dL — ABNORMAL LOW (ref 6.5–8.1)

## 2020-10-27 LAB — BRAIN NATRIURETIC PEPTIDE: B Natriuretic Peptide: 93.9 pg/mL (ref 0.0–100.0)

## 2020-10-27 LAB — CBC WITH DIFFERENTIAL/PLATELET
Abs Immature Granulocytes: 0.26 10*3/uL — ABNORMAL HIGH (ref 0.00–0.07)
Basophils Absolute: 0.1 10*3/uL (ref 0.0–0.1)
Basophils Relative: 0 %
Eosinophils Absolute: 0 10*3/uL (ref 0.0–0.5)
Eosinophils Relative: 0 %
HCT: 39.6 % (ref 39.0–52.0)
Hemoglobin: 13.6 g/dL (ref 13.0–17.0)
Immature Granulocytes: 1 %
Lymphocytes Relative: 4 %
Lymphs Abs: 0.9 10*3/uL (ref 0.7–4.0)
MCH: 31.3 pg (ref 26.0–34.0)
MCHC: 34.3 g/dL (ref 30.0–36.0)
MCV: 91.2 fL (ref 80.0–100.0)
Monocytes Absolute: 1.4 10*3/uL — ABNORMAL HIGH (ref 0.1–1.0)
Monocytes Relative: 5 %
Neutro Abs: 23.8 10*3/uL — ABNORMAL HIGH (ref 1.7–7.7)
Neutrophils Relative %: 90 %
Platelets: 227 10*3/uL (ref 150–400)
RBC: 4.34 MIL/uL (ref 4.22–5.81)
RDW: 14.6 % (ref 11.5–15.5)
Smear Review: NORMAL
WBC: 26.5 10*3/uL — ABNORMAL HIGH (ref 4.0–10.5)
nRBC: 0 % (ref 0.0–0.2)

## 2020-10-27 LAB — URINALYSIS, COMPLETE (UACMP) WITH MICROSCOPIC
Bacteria, UA: NONE SEEN
Bilirubin Urine: NEGATIVE
Glucose, UA: NEGATIVE mg/dL
Hgb urine dipstick: NEGATIVE
Ketones, ur: NEGATIVE mg/dL
Leukocytes,Ua: NEGATIVE
Nitrite: NEGATIVE
Protein, ur: NEGATIVE mg/dL
Specific Gravity, Urine: 1.017 (ref 1.005–1.030)
pH: 5 (ref 5.0–8.0)

## 2020-10-27 LAB — RESP PANEL BY RT-PCR (FLU A&B, COVID) ARPGX2
Influenza A by PCR: NEGATIVE
Influenza B by PCR: NEGATIVE
SARS Coronavirus 2 by RT PCR: NEGATIVE

## 2020-10-27 LAB — TROPONIN I (HIGH SENSITIVITY)
Troponin I (High Sensitivity): 10 ng/L (ref <18)
Troponin I (High Sensitivity): 12 ng/L (ref <18)

## 2020-10-27 LAB — APTT: aPTT: 32 seconds (ref 24–36)

## 2020-10-27 LAB — PROTIME-INR
INR: 1.1 (ref 0.8–1.2)
Prothrombin Time: 13.9 seconds (ref 11.4–15.2)

## 2020-10-27 LAB — LACTIC ACID, PLASMA
Lactic Acid, Venous: 3.7 mmol/L (ref 0.5–1.9)
Lactic Acid, Venous: 3.1 mmol/L (ref 0.5–1.9)
Lactic Acid, Venous: 1.5 mmol/L (ref 0.5–1.9)

## 2020-10-27 LAB — PROCALCITONIN: Procalcitonin: 8.1 ng/mL

## 2020-10-27 MED ORDER — CYCLOSPORINE 0.05 % OP EMUL
1.0000 [drp] | Freq: Two times a day (BID) | OPHTHALMIC | Status: DC | PRN
  Administered 2020-10-28 – 2020-11-01 (×3): 1 [drp] via OPHTHALMIC
  Filled 2020-10-27 (×5): qty 1

## 2020-10-27 MED ORDER — VANCOMYCIN HCL IN DEXTROSE 1-5 GM/200ML-% IV SOLN
1000.0000 mg | Freq: Once | INTRAVENOUS | Status: AC
  Administered 2020-10-27: 1000 mg via INTRAVENOUS
  Filled 2020-10-27: qty 200

## 2020-10-27 MED ORDER — TAMSULOSIN HCL 0.4 MG PO CAPS
0.4000 mg | ORAL_CAPSULE | Freq: Every day | ORAL | Status: DC
  Administered 2020-10-27 – 2020-10-31 (×5): 0.4 mg via ORAL
  Filled 2020-10-27 (×5): qty 1

## 2020-10-27 MED ORDER — IPRATROPIUM-ALBUTEROL 0.5-2.5 (3) MG/3ML IN SOLN
3.0000 mL | Freq: Four times a day (QID) | RESPIRATORY_TRACT | Status: DC | PRN
  Administered 2020-10-27: 3 mL via RESPIRATORY_TRACT
  Filled 2020-10-27: qty 3

## 2020-10-27 MED ORDER — ACETAMINOPHEN 325 MG PO TABS
650.0000 mg | ORAL_TABLET | ORAL | Status: DC | PRN
  Administered 2020-10-28 – 2020-10-31 (×5): 650 mg via ORAL
  Filled 2020-10-27 (×5): qty 2

## 2020-10-27 MED ORDER — SODIUM CHLORIDE 0.9 % IV SOLN
2.0000 g | Freq: Once | INTRAVENOUS | Status: AC
  Administered 2020-10-27: 2 g via INTRAVENOUS
  Filled 2020-10-27: qty 2

## 2020-10-27 MED ORDER — LACTATED RINGERS IV BOLUS (SEPSIS)
1000.0000 mL | Freq: Once | INTRAVENOUS | Status: AC
  Administered 2020-10-27: 1000 mL via INTRAVENOUS

## 2020-10-27 MED ORDER — MONTELUKAST SODIUM 10 MG PO TABS
5.0000 mg | ORAL_TABLET | Freq: Every day | ORAL | Status: DC
  Administered 2020-10-27 – 2020-10-31 (×5): 5 mg via ORAL
  Filled 2020-10-27 (×4): qty 1
  Filled 2020-10-27: qty 0.5
  Filled 2020-10-27: qty 1
  Filled 2020-10-27: qty 0.5

## 2020-10-27 MED ORDER — FENOFIBRATE 54 MG PO TABS
54.0000 mg | ORAL_TABLET | Freq: Every day | ORAL | Status: DC
  Administered 2020-10-28 – 2020-11-01 (×5): 54 mg via ORAL
  Filled 2020-10-27 (×5): qty 1

## 2020-10-27 MED ORDER — GABAPENTIN 100 MG PO CAPS
200.0000 mg | ORAL_CAPSULE | Freq: Every day | ORAL | Status: DC
  Administered 2020-10-27 (×2): 200 mg via ORAL
  Filled 2020-10-27: qty 2

## 2020-10-27 MED ORDER — NORTRIPTYLINE HCL 25 MG PO CAPS
150.0000 mg | ORAL_CAPSULE | Freq: Every day | ORAL | Status: DC
  Administered 2020-10-28 – 2020-11-01 (×5): 150 mg via ORAL
  Filled 2020-10-27 (×5): qty 6

## 2020-10-27 MED ORDER — FINASTERIDE 5 MG PO TABS
5.0000 mg | ORAL_TABLET | Freq: Every day | ORAL | Status: DC
  Administered 2020-10-28 – 2020-11-01 (×5): 5 mg via ORAL
  Filled 2020-10-27 (×5): qty 1

## 2020-10-27 MED ORDER — TIZANIDINE HCL 2 MG PO TABS
2.0000 mg | ORAL_TABLET | Freq: Four times a day (QID) | ORAL | Status: DC | PRN
  Administered 2020-10-30: 2 mg via ORAL
  Filled 2020-10-27 (×3): qty 1

## 2020-10-27 MED ORDER — FAMOTIDINE 20 MG PO TABS
40.0000 mg | ORAL_TABLET | Freq: Every day | ORAL | Status: DC
  Administered 2020-10-27 – 2020-10-28 (×2): 40 mg via ORAL
  Filled 2020-10-27 (×2): qty 2

## 2020-10-27 MED ORDER — ONDANSETRON HCL 4 MG/2ML IJ SOLN: 4.0000 mg | Freq: Four times a day (QID) | INTRAMUSCULAR | Status: DC | PRN

## 2020-10-27 MED ORDER — ENOXAPARIN SODIUM 40 MG/0.4ML ~~LOC~~ SOLN
40.0000 mg | SUBCUTANEOUS | Status: DC
  Administered 2020-10-27 – 2020-10-31 (×5): 40 mg via SUBCUTANEOUS
  Filled 2020-10-27 (×6): qty 0.4

## 2020-10-27 MED ORDER — MIRABEGRON ER 50 MG PO TB24
50.0000 mg | ORAL_TABLET | Freq: Every day | ORAL | Status: DC
  Administered 2020-10-28 – 2020-11-01 (×5): 50 mg via ORAL
  Filled 2020-10-27 (×5): qty 1

## 2020-10-27 MED ORDER — GABAPENTIN 600 MG PO TABS
600.0000 mg | ORAL_TABLET | Freq: Every day | ORAL | Status: DC
  Administered 2020-10-27 – 2020-10-31 (×5): 600 mg via ORAL
  Filled 2020-10-27 (×5): qty 1

## 2020-10-27 MED ORDER — SODIUM CHLORIDE 0.9 % IV SOLN
2.0000 g | INTRAVENOUS | Status: AC
  Administered 2020-10-27 – 2020-10-31 (×5): 2 g via INTRAVENOUS
  Filled 2020-10-27: qty 20
  Filled 2020-10-27 (×4): qty 2

## 2020-10-27 MED ORDER — ONDANSETRON HCL 4 MG PO TABS: 4.0000 mg | ORAL_TABLET | Freq: Four times a day (QID) | ORAL | Status: DC | PRN

## 2020-10-27 MED ORDER — ATORVASTATIN CALCIUM 10 MG PO TABS
10.0000 mg | ORAL_TABLET | Freq: Every day | ORAL | Status: DC
  Administered 2020-10-28 – 2020-11-01 (×5): 10 mg via ORAL
  Filled 2020-10-27 (×5): qty 1

## 2020-10-27 MED ORDER — ADULT MULTIVITAMIN W/MINERALS CH
1.0000 | ORAL_TABLET | Freq: Every day | ORAL | Status: DC
  Administered 2020-10-28 – 2020-11-01 (×5): 1 via ORAL
  Filled 2020-10-27 (×5): qty 1

## 2020-10-27 MED ORDER — SODIUM CHLORIDE 0.9 % IV SOLN: INTRAVENOUS | Status: DC

## 2020-10-27 MED ORDER — LACTATED RINGERS IV BOLUS
1000.0000 mL | Freq: Once | INTRAVENOUS | Status: AC
  Administered 2020-10-27: 1000 mL via INTRAVENOUS

## 2020-10-27 MED ORDER — LEVOTHYROXINE SODIUM 50 MCG PO TABS
50.0000 ug | ORAL_TABLET | Freq: Every day | ORAL | Status: DC
  Administered 2020-10-28 – 2020-11-01 (×5): 50 ug via ORAL
  Filled 2020-10-27 (×5): qty 1

## 2020-10-27 MED ORDER — ASPIRIN EC 81 MG PO TBEC
81.0000 mg | DELAYED_RELEASE_TABLET | Freq: Every day | ORAL | Status: DC
  Administered 2020-10-27 – 2020-10-31 (×5): 81 mg via ORAL
  Filled 2020-10-27 (×5): qty 1

## 2020-10-27 MED ORDER — MIDODRINE HCL 5 MG PO TABS
10.0000 mg | ORAL_TABLET | Freq: Three times a day (TID) | ORAL | Status: DC
  Administered 2020-10-28 (×3): 10 mg via ORAL
  Filled 2020-10-27 (×7): qty 2

## 2020-10-27 MED ORDER — MOMETASONE FURO-FORMOTEROL FUM 200-5 MCG/ACT IN AERO
2.0000 | INHALATION_SPRAY | Freq: Two times a day (BID) | RESPIRATORY_TRACT | Status: DC
  Filled 2020-10-27 (×2): qty 8.8

## 2020-10-27 MED ORDER — AZITHROMYCIN 500 MG IV SOLR
500.0000 mg | INTRAVENOUS | Status: AC
  Administered 2020-10-27 – 2020-10-31 (×5): 500 mg via INTRAVENOUS
  Filled 2020-10-27 (×5): qty 500

## 2020-10-27 NOTE — Progress Notes (Signed)
CODE SEPSIS - PHARMACY COMMUNICATION  **Broad Spectrum Antibiotics should be administered within 1 hour of Sepsis diagnosis**  Time Code Sepsis Called/Page Received: 11:10  Antibiotics Ordered: Cefepime and Vancomycin   Time of 1st antibiotic administration: Cefepime given at 11:32  Additional action taken by pharmacy: n/a  If necessary, Name of Provider/Nurse Contacted: n/a    Dakota Harper ,PharmD Clinical Pharmacist  10/27/2020  12:27 PM

## 2020-10-27 NOTE — Sepsis Progress Note (Signed)
Notified provider of need to order additional fluid bolus for persistently LOW BP.

## 2020-10-27 NOTE — ED Notes (Signed)
Pt on bedpan.

## 2020-10-27 NOTE — ED Notes (Signed)
Date and time results received: 10/27/20 11:56 AM   Test: Lactic Critical Value: 3.7  Name of Provider Notified: Siadecki

## 2020-10-27 NOTE — Progress Notes (Signed)
PHARMACY -  BRIEF ANTIBIOTIC NOTE   Pharmacy has received consult(s) for cefepime and vancomycin from an ED provider.  The patient's profile has been reviewed for ht/wt/allergies/indication/available labs.    One time order(s) placed for Cefepime 2g and Vancomycin 1g have been placed. Will order an additional vancomycin 1g IV for a total loading dose of 2g.   Further antibiotics/pharmacy consults should be ordered by admitting physician if indicated.                       Thank you, Pernell Dupre, PharmD, BCPS Clinical Pharmacist 10/27/2020 12:08 PM

## 2020-10-27 NOTE — Consult Note (Signed)
CRITICAL CARE PROGRESS NOTE    Name: Dakota Harper MRN: [id number] DOB: Mar 26, 1946     LOS: 0  Referring physician: Dr Joylene IgoAgbata  SUBJECTIVE FINDINGS & SIGNIFICANT EVENTS    Patient description:    [age] M hx of hypertension, neuropathy, BPH, COPD who was sent from the skilled nursing facility for evaluation of a fever and low blood pressure. Noted to be febrile at SNF. Presented with symptoms of LRTI including cough and dyspnea,chills. He denies having any nausea, no vomiting, no constipation, no diarrhea, no urinary frequency, no nocturia, no dysuria, no dizziness, no lightheadedness,  No headache, no chest pain, no abdominal pain.  CXR with R lung infiltrate suggestive of CAP. Patient received 3L LR in ER and then required vasopressor support so PCCM was consulted for additional evaluation and management.    Lines/tubes :   Microbiology/Sepsis markers: Results for orders placed or performed during the hospital encounter of 10/27/20  Resp Panel by RT-PCR (Flu A&B, Covid) Nasopharyngeal Swab     Status: None   Collection Time: 10/27/20 11:10 AM   Specimen: Nasopharyngeal Swab; Nasopharyngeal(NP) swabs in vial transport medium  Result Value Ref Range Status   SARS Coronavirus 2 by RT PCR NEGATIVE NEGATIVE Final    Comment: (NOTE) SARS-CoV-2 target nucleic acids are NOT DETECTED.  The SARS-CoV-2 RNA is generally detectable in upper respiratory specimens during the acute phase of infection. The lowest concentration of SARS-CoV-2 viral copies this assay can detect is 138 copies/mL. A negative result does not preclude SARS-Cov-2 infection and should not be used as the sole basis for treatment or other patient management decisions. A negative result may occur with  improper specimen collection/handling, submission  of specimen other than nasopharyngeal swab, presence of viral mutation(s) within the areas targeted by this assay, and inadequate number of viral copies(<138 copies/mL). A negative result must be combined with clinical observations, patient history, and epidemiological information. The expected result is Negative.  Fact Sheet for Patients:  BloggerCourse.comhttps://www.fda.gov/media/152166/download  Fact Sheet for Healthcare Providers:  SeriousBroker.ithttps://www.fda.gov/media/152162/download  This test is no t yet approved or cleared by the [place] FDA and  has been authorized for detection and/or diagnosis of SARS-CoV-2 by FDA under an Emergency Use Authorization (EUA). This EUA will remain  in effect (meaning this test can be used) for the duration of the COVID-19 declaration under Section 564(b)(1) of the Act, 21 U.S.C.section 360bbb-3(b)(1), unless the authorization is terminated  or revoked sooner.       Influenza A by PCR NEGATIVE NEGATIVE Final   Influenza B by PCR NEGATIVE NEGATIVE Final    Comment: (NOTE) The Xpert Xpress SARS-CoV-2/FLU/RSV plus assay is intended as an aid in the diagnosis of influenza from Nasopharyngeal swab specimens and should not be used as a sole basis for treatment. Nasal washings and aspirates are unacceptable for Xpert Xpress SARS-CoV-2/FLU/RSV testing.  Fact Sheet for Patients: BloggerCourse.comhttps://www.fda.gov/media/152166/download  Fact Sheet for Healthcare Providers: SeriousBroker.ithttps://www.fda.gov/media/152162/download  This test is not yet approved or cleared by the [place] FDA and has been authorized for detection and/or diagnosis of SARS-CoV-2 by FDA under an Emergency Use Authorization (EUA). This EUA will remain in effect (meaning this test can be used) for the duration of the COVID-19 declaration under Section 564(b)(1) of the Act, 21 U.S.C. section 360bbb-3(b)(1), unless the authorization is terminated or revoked.  Performed at [facility], [address] [place], [place] [address]     Anti-infectives:  Anti-infectives (From admission, onward)   Start  Dose/Rate Route Frequency Ordered Stop   10/27/20 2200  cefTRIAXone (ROCEPHIN) 2 g in sodium chloride 0.9 % 100 mL IVPB        2 g 200 mL/hr over 30 Minutes Intravenous Every 24 hours 10/27/20 1330     10/27/20 1400  azithromycin (ZITHROMAX) 500 mg in sodium chloride 0.9 % 250 mL IVPB        500 mg 250 mL/hr over 60 Minutes Intravenous Every 24 hours 10/27/20 1330     10/27/20 1230  vancomycin (VANCOCIN) IVPB 1000 mg/200 mL premix        1,000 mg 200 mL/hr over 60 Minutes Intravenous  Once 10/27/20 1208 10/27/20 1459   10/27/20 1115  ceFEPIme (MAXIPIME) 2 g in sodium chloride 0.9 % 100 mL IVPB        2 g 200 mL/hr over 30 Minutes Intravenous  Once 10/27/20 1110 10/27/20 1216   10/27/20 1115  vancomycin (VANCOCIN) IVPB 1000 mg/200 mL premix        1,000 mg 200 mL/hr over 60 Minutes Intravenous  Once 10/27/20 1110 10/27/20 1354        PAST MEDICAL HISTORY   Past Medical History:  Diagnosis Date  . Asthma   . BPH (benign prostatic hyperplasia)   . Ear pain   . Hypercholesteremia   . Hypertension   . Neuropathy   . Sleep disorder      SURGICAL HISTORY   Past Surgical History:  Procedure Laterality Date  . CHOLECYSTECTOMY    . HERNIA REPAIR       FAMILY HISTORY   Family History  Problem Relation Age of Onset  . Prostate cancer Neg Hx   . Bladder Cancer Neg Hx   . Kidney cancer Neg Hx      SOCIAL HISTORY   Social History   Tobacco Use  . Smoking status: Never Smoker  . Smokeless tobacco: Never Used  Vaping Use  . Vaping Use: Never used  Substance Use Topics  . Alcohol use: No  . Drug use: No     MEDICATIONS   Current Medication:  Current Facility-Administered Medications:  .  0.9 %  sodium chloride infusion, , Intravenous, Continuous, Agbata, Tochukwu, MD .  acetaminophen (TYLENOL) tablet 650 mg, 650 mg, Oral, Q4H PRN, Agbata, Tochukwu, MD .   aspirin EC tablet 81 mg, 81 mg, Oral, QHS, Agbata, Tochukwu, MD .  atorvastatin (LIPITOR) tablet 10 mg, 10 mg, Oral, Daily, Agbata, Tochukwu, MD .  azithromycin (ZITHROMAX) 500 mg in sodium chloride 0.9 % 250 mL IVPB, 500 mg, Intravenous, Q24H, Agbata, Tochukwu, MD, Stopped at 10/27/20 1721 .  cefTRIAXone (ROCEPHIN) 2 g in sodium chloride 0.9 % 100 mL IVPB, 2 g, Intravenous, Q24H, Agbata, Tochukwu, MD .  cycloSPORINE (RESTASIS) 0.05 % ophthalmic emulsion 1 drop, 1 drop, Both Eyes, BID PRN, Agbata, Tochukwu, MD .  enoxaparin (LOVENOX) injection 40 mg, 40 mg, Subcutaneous, Q24H, Agbata, Tochukwu, MD .  famotidine (PEPCID) tablet 40 mg, 40 mg, Oral, Daily, Agbata, Tochukwu, MD .  Derrill Memo ON 10/28/2020] fenofibrate tablet 54 mg, 54 mg, Oral, Daily, Agbata, Tochukwu, MD .  finasteride (PROSCAR) tablet 5 mg, 5 mg, Oral, Daily, Agbata, Tochukwu, MD .  gabapentin (NEURONTIN) capsule 200 mg, 200 mg, Oral, QHS, Agbata, Tochukwu, MD .  gabapentin (NEURONTIN) tablet 600 mg, 600 mg, Oral, QHS, Agbata, Tochukwu, MD .  ipratropium-albuterol (DUONEB) 0.5-2.5 (3) MG/3ML nebulizer solution 3 mL, 3 mL, Nebulization, Q6H PRN, Agbata, Tochukwu, MD .  Derrill Memo ON 10/28/2020] levothyroxine (SYNTHROID) tablet  50 mcg, 50 mcg, Oral, QAC breakfast, Agbata, Tochukwu, MD .  mirabegron ER (MYRBETRIQ) tablet 50 mg, 50 mg, Oral, Daily, Agbata, Tochukwu, MD .  mometasone-formoterol (DULERA) 200-5 MCG/ACT inhaler 2 puff, 2 puff, Inhalation, BID, Agbata, Tochukwu, MD .  montelukast (SINGULAIR) chewable tablet 5 mg, 5 mg, Oral, QHS, Agbata, Tochukwu, MD .  Multi-Vitamins TABS 1 tablet, 1 tablet, Oral, Daily, Agbata, Tochukwu, MD .  nortriptyline (PAMELOR) capsule 150 mg, 150 mg, Oral, Daily, Agbata, Tochukwu, MD .  ondansetron (ZOFRAN) tablet 4 mg, 4 mg, Oral, Q6H PRN **OR** ondansetron (ZOFRAN) injection 4 mg, 4 mg, Intravenous, Q6H PRN, Agbata, Tochukwu, MD .  tamsulosin (FLOMAX) capsule 0.4 mg, 0.4 mg, Oral, QHS, Agbata, Tochukwu,  MD .  tiZANidine (ZANAFLEX) tablet 2 mg, 2 mg, Oral, Q6H PRN, Agbata, Tochukwu, MD  Current Outpatient Medications:  .  acetaminophen (TYLENOL) 325 MG tablet, Take 650 mg by mouth every 4 (four) hours as needed for mild pain., Disp: , Rfl:  .  acetaminophen (TYLENOL) 500 MG tablet, Take 500 mg by mouth every 4 (four) hours as needed for moderate pain or mild pain., Disp: , Rfl:  .  albuterol (PROVENTIL HFA;VENTOLIN HFA) 108 (90 BASE) MCG/ACT inhaler, Inhale 1 puff into the lungs 3 (three) times daily as needed for wheezing or shortness of breath., Disp: , Rfl:  .  alum & mag hydroxide-simeth (MAALOX/MYLANTA) 200-200-20 MG/5ML suspension, Take 30 mLs by mouth 4 (four) times daily as needed for indigestion or heartburn., Disp: , Rfl:  .  aspirin EC 81 MG tablet, Take 81 mg by mouth at bedtime. , Disp: , Rfl:  .  atorvastatin (LIPITOR) 10 MG tablet, Take 10 mg by mouth every evening., Disp: , Rfl:  .  barrier cream (NON-SPECIFIED) CREA, Apply 1 application topically as needed (post-toileting skin protection)., Disp: , Rfl:  .  carbamide peroxide (EAR WAX REMOVAL DROPS) 6.5 % OTIC solution, Place 5 drops into both ears every Sunday., Disp: , Rfl:  .  chlorhexidine (PERIDEX) 0.12 % solution, Use as directed 15 mLs in the mouth or throat 2 (two) times daily as needed (oral discomfort)., Disp: , Rfl:  .  cycloSPORINE (RESTASIS) 0.05 % ophthalmic emulsion, Place 1 drop into both eyes 2 (two) times daily as needed (dry eyes)., Disp: , Rfl:  .  diphenhydrAMINE (BENADRYL) 25 MG tablet, Take 25 mg by mouth every 6 (six) hours as needed for itching or allergies., Disp: , Rfl:  .  famotidine (PEPCID) 40 MG tablet, Take 40 mg by mouth every evening., Disp: , Rfl:  .  finasteride (PROSCAR) 5 MG tablet, Take 1 tablet (5 mg total) by mouth daily., Disp: 90 tablet, Rfl: 3 .  fluocinonide (LIDEX) 0.05 % external solution, Apply 1 application topically 2 (two) times daily., Disp: , Rfl:  .  fluocinonide ointment  (LIDEX) [id number] %, Apply 1 application topically 2 (two) times daily as needed (pruritus). (apply to legs), Disp: , Rfl:  .  fluticasone furoate-vilanterol (BREO ELLIPTA) 200-25 MCG/INH AEPB, Inhale 1 puff into the lungs daily., Disp: , Rfl:  .  gabapentin (NEURONTIN) 100 MG capsule, Take 200 mg by mouth at bedtime. (take with 600mg  tablet to equal 800mg  total), Disp: , Rfl:  .  gabapentin (NEURONTIN) 600 MG tablet, Take 600 mg by mouth at bedtime. (take with 100mg  capsules to equal 800mg  total), Disp: , Rfl:  .  guaiFENesin (ROBITUSSIN) 100 MG/5ML liquid, Take 300 mg by mouth every 6 (six) hours as needed for cough., Disp: , Rfl:  .  hydrocortisone 1 % ointment, Apply 1 application topically daily as needed for itching., Disp: , Rfl:  .  ibuprofen (ADVIL) 400 MG tablet, Take 400 mg by mouth 3 (three) times daily as needed for mild pain or moderate pain., Disp: , Rfl:  .  ipratropium-albuterol (DUONEB) 0.5-2.5 (3) MG/3ML SOLN, Take 3 mLs by nebulization 2 (two) times daily., Disp: , Rfl:  .  levothyroxine (SYNTHROID, LEVOTHROID) 50 MCG tablet, Take 50 mcg daily before breakfast by mouth., Disp: , Rfl:  .  loperamide (IMODIUM) 2 MG capsule, Take 4 mg by mouth as needed for diarrhea or loose stools ((max 8 doses in 24 hours))., Disp: , Rfl:  .  losartan (COZAAR) 25 MG tablet, Take 25 mg by mouth daily., Disp: , Rfl:  .  magnesium hydroxide (MILK OF MAGNESIA) 400 MG/5ML suspension, Take 30 mLs by mouth daily as needed for mild constipation or moderate constipation., Disp: , Rfl:  .  mirabegron ER (MYRBETRIQ) 50 MG TB24 tablet, Take 50 mg by mouth daily., Disp: , Rfl:  .  montelukast (SINGULAIR) 5 MG chewable tablet, Chew 5 mg by mouth at bedtime., Disp: , Rfl:  .  Multiple Vitamin (MULTI-VITAMINS) TABS, Take 1 tablet by mouth daily. , Disp: , Rfl:  .  nortriptyline (PAMELOR) 75 MG capsule, Take 150 mg by mouth daily. , Disp: , Rfl:  .  omeprazole (PRILOSEC) 10 MG capsule, Take 10 mg by mouth daily., Disp:  , Rfl:  .  polyethylene glycol (MIRALAX / GLYCOLAX) 17 g packet, Take 17 g by mouth daily as needed for mild constipation or moderate constipation., Disp: , Rfl:  .  tamsulosin (FLOMAX) 0.4 MG CAPS capsule, Take 0.4 mg by mouth at bedtime., Disp: , Rfl:  .  tiZANidine (ZANAFLEX) 2 MG tablet, Take 2 mg by mouth at bedtime., Disp: , Rfl:     ALLERGIES   Saw palmetto, Other, Sulfa antibiotics, and Sulfacetamide sodium    REVIEW OF SYSTEMS    10 point ROS done and is negative except as per subjective findings.   PHYSICAL EXAMINATION   Vital Signs: Temp:  [98.2 F (36.8 C)] 98.2 F (36.8 C) (12/22 1103) Pulse Rate:  [76-86] 80 (12/22 1715) Resp:  [13-42] 13 (12/22 1715) BP: (72-117)/(40-91) 97/51 (12/22 1715) SpO2:  [90 %-100 %] 99 % (12/22 1715) Weight:  [99.8 kg] 99.8 kg (12/22 1103)  GENERAL:age appropriate HEAD: Normocephalic, atraumatic.  EYES: Pupils equal, round, reactive to light.  No scleral icterus.  MOUTH: Moist mucosal membrane. NECK: Supple. No thyromegaly. No nodules. No JVD.  PULMONARY: mild crackles CARDIOVASCULAR: S1 and S2. Regular rate and rhythm. No murmurs, rubs, or gallops.  GASTROINTESTINAL: Soft, nontender, non-distended. No masses. Positive bowel sounds. No hepatosplenomegaly.  MUSCULOSKELETAL: No swelling, clubbing, or edema.  NEUROLOGIC: Mild distress due to acute illness SKIN:intact,warm,dry   PERTINENT DATA     Infusions: . sodium chloride    . azithromycin Stopped (10/27/20 1721)  . cefTRIAXone (ROCEPHIN)  IV     Scheduled Medications: . aspirin EC  81 mg Oral QHS  . atorvastatin  10 mg Oral Daily  . enoxaparin (LOVENOX) injection  40 mg Subcutaneous Q24H  . famotidine  40 mg Oral Daily  . [START ON 10/28/2020] fenofibrate  54 mg Oral Daily  . finasteride  5 mg Oral Daily  . gabapentin  200 mg Oral QHS  . gabapentin  600 mg Oral QHS  . [START ON 10/28/2020] levothyroxine  50 mcg Oral QAC breakfast  . mirabegron ER  50 mg Oral  Daily  . mometasone-formoterol  2 puff Inhalation BID  . montelukast  5 mg Oral QHS  . Multi-Vitamins  1 tablet Oral Daily  . nortriptyline  150 mg Oral Daily  . tamsulosin  0.4 mg Oral QHS   PRN Medications: acetaminophen, cycloSPORINE, ipratropium-albuterol, ondansetron **OR** ondansetron (ZOFRAN) IV, tiZANidine Hemodynamic parameters:   Intake/Output: No intake/output data recorded.  Ventilator  Settings:      LAB RESULTS:  Basic Metabolic Panel: Recent Labs  Lab 10/27/20 1110  NA 137  K 4.3  CL 104  CO2 20*  GLUCOSE 126*  BUN 26*  CREATININE 2.39*  CALCIUM 8.6*   Liver Function Tests: Recent Labs  Lab 10/27/20 1110  AST 28  ALT 19  ALKPHOS 78  BILITOT 1.8*  PROT 6.2*  ALBUMIN 2.9*   No results for input(s): LIPASE, AMYLASE in the last 168 hours. No results for input(s): AMMONIA in the last 168 hours. CBC: Recent Labs  Lab 10/27/20 1110  WBC 26.5*  NEUTROABS 23.8*  HGB 13.6  HCT 39.6  MCV 91.2  PLT 227   Cardiac Enzymes: No results for input(s): CKTOTAL, CKMB, CKMBINDEX, TROPONINI in the last 168 hours. BNP: Invalid input(s): POCBNP CBG: No results for input(s): GLUCAP in the last 168 hours.     IMAGING RESULTS:  Imaging: DG Chest Port 1 View  Result Date: 10/27/2020 CLINICAL DATA:  Fever EXAM: PORTABLE CHEST 1 VIEW COMPARISON:  11/25/2019 FINDINGS: Heart size within normal limits. Low lung volumes. Atherosclerotic calcification of the aortic knob. Patchy airspace opacity within the right upper lobe. Bibasilar interstitial prominence. No large pleural fluid collection. No pneumothorax. IMPRESSION: Patchy airspace opacity within the right upper lobe, suspicious for pneumonia. Electronically Signed   By: Davina Poke D.O.   On: 10/27/2020 11:31   @[email or web address] @ DG Chest Port 1 View  Result Date: 10/27/2020 CLINICAL DATA:  Fever EXAM: PORTABLE CHEST 1 VIEW COMPARISON:  11/25/2019 FINDINGS: Heart size within normal limits. Low lung  volumes. Atherosclerotic calcification of the aortic knob. Patchy airspace opacity within the right upper lobe. Bibasilar interstitial prominence. No large pleural fluid collection. No pneumothorax. IMPRESSION: Patchy airspace opacity within the right upper lobe, suspicious for pneumonia. Electronically Signed   By: Davina Poke D.O.   On: 10/27/2020 11:31       ASSESSMENT AND PLAN    -Multidisciplinary rounds held today   Septic shock    - present on admission    - due to right lung community acquired pneumonia -RVP -resp culture -procalcitonin -empiric abx -use vasopressors to keep MAP>65- currently midodrine po only  -follow ABG and LA -follow up cultures -emperic ABX -consider stress dose steroids   ID -continue IV abx as prescibed -follow up cultures  GI/Nutrition GI PROPHYLAXIS as indicated DIET-->TF's as tolerated Constipation protocol as indicated  ENDO - ICU hypoglycemic\Hyperglycemia protocol -check FSBS per protocol   ELECTROLYTES -follow labs as needed -replace as needed -pharmacy consultation   DVT/GI PRX ordered -SCDs  TRANSFUSIONS AS NEEDED MONITOR FSBS ASSESS the need for LABS as needed   Critical care provider statement:    Critical care time (minutes):  33   Critical care time was exclusive of:  Separately billable procedures and treating other patients   Critical care was necessary to treat or prevent imminent or life-threatening deterioration of the following conditions:  CAP, septic shock, multiple comorbid conditions   Critical care was time spent personally by me on the following activities:  Development of treatment plan  with patient or surrogate, discussions with consultants, evaluation of patient's response to treatment, examination of patient, obtaining history from patient or surrogate, ordering and performing treatments and interventions, ordering and review of laboratory studies and re-evaluation of patient's condition.  I  assumed direction of critical care for this patient from another provider in my specialty: no    This document was prepared using Dragon voice recognition software and may include unintentional dictation errors.    Ottie Glazier, M.D.  Division of [place]

## 2020-10-27 NOTE — ED Triage Notes (Signed)
Pt arrived via ems from the [facility]. Ems reports called out for temp of 101.8 reportedly taken at 1000 prior to ems called. At 0700 temp of 98.1. ems reports staff at facility gave tylenol "just because".  ems arrived  Dizzy when standing but hx of vertigo. Flu like symptoms reported by pt x 3 days, productive cough, sore throat. A&o x 4 on arrival, NAD noted at this time  90-92% RA, pt denied any SOB ems BP 74/42 HR 88 147 cbg  20g left forearm. 228ml NS prior to arrival

## 2020-10-27 NOTE — ED Provider Notes (Signed)
[facility] Emergency Department Provider Note ____________________________________________   Event Date/Time   First MD Initiated Contact with Patient 10/27/20 1056     (approximate)  I have reviewed the triage vital signs and the nursing notes.   HISTORY  Chief Complaint Fever and Hypotension    HPI Dakota Harper is a 74 y.o. male with PMH as noted below who presents from a facility with fever measured at some point earlier today to 100.8 along with a low blood pressure.  EMS reports that Tylenol was given at the facility.  The patient reports chills and weakness.  He also has had some cough but states this is chronic and due to his COPD.  He has no vomiting or diarrhea, urinary symptoms, or chest pain.   Past Medical History:  Diagnosis Date  . Asthma   . BPH (benign prostatic hyperplasia)   . Ear pain   . Hypercholesteremia   . Hypertension   . Neuropathy   . Sleep disorder     Patient Active Problem List   Diagnosis Date Noted  . Ingrown left big toenail 10/25/2020  . Ingrown nail of great toe of right foot 10/25/2020  . Pneumonia 11/26/2019  . Community acquired bacterial pneumonia 11/25/2019  . Sepsis ([place]) 11/25/2019  . Weakness 11/25/2019  . CKD (chronic kidney disease), stage III ([place]) 11/25/2019  . Hypothyroidism 11/25/2019  . BPH (benign prostatic hyperplasia) 11/25/2019  . Headache 11/25/2019  . Tremors of nervous system 11/25/2019  . Thrombocytosis 11/25/2019  . Elevated bilirubin 11/25/2019    Past Surgical History:  Procedure Laterality Date  . CHOLECYSTECTOMY    . HERNIA REPAIR      Prior to Admission medications   Medication Sig Start Date End Date Taking? Authorizing Provider  acetaminophen (TYLENOL) 500 MG tablet Take 650 mg by mouth every 4 (four) hours as needed for mild pain. Take 2 tablets per mouth,prn    [provider]  albuterol (PROVENTIL HFA;VENTOLIN HFA) 108 (90 BASE) MCG/ACT inhaler Inhale  1 puff into the lungs 3 (three) times daily.    [provider]  alum & mag hydroxide-simeth (MAALOX/MYLANTA) 200-200-20 MG/5ML suspension Take 30 mLs by mouth 4 (four) times daily as needed for indigestion or heartburn.    [provider]  aspirin EC 81 MG tablet Take 81 mg by mouth at bedtime.     [provider]  atorvastatin (LIPITOR) 10 MG tablet Take 10 mg by mouth daily.    [provider]  barrier cream (NON-SPECIFIED) CREA Apply 1 application topically as needed (post-toileting skin protection).    [provider]  carbamide peroxide (EAR WAX REMOVAL DROPS) 6.5 % OTIC solution 5 drops into affected ear    [provider]  chlorhexidine (PERIDEX) 0.12 % solution Use as directed 15 mLs in the mouth or throat 2 (two) times daily as needed (oral discomfort).    [provider]  ciprofloxacin (CIPRO) 250 MG tablet Take 1 tablet (250 mg total) by mouth 2 (two) times daily. 01/23/20   McGowan, Larene Beach A, PA-C  cycloSPORINE (RESTASIS) 0.05 % ophthalmic emulsion Place 1 drop into both eyes 2 (two) times daily as needed (dry eyes).    [provider]  famotidine (PEPCID) 40 MG tablet Take 40 mg by mouth daily.    [provider]  fenofibrate (TRICOR) 145 MG tablet Take 145 mg by mouth at bedtime.    [provider]  finasteride (PROSCAR) 5 MG tablet Take 1  tablet (5 mg total) by mouth daily. 01/22/20   Zara Council A, PA-C  fluocinonide ointment (LIDEX) [id number] % Apply 1 application topically 2 (two) times daily as needed (pruritus).     [provider]  fluticasone furoate-vilanterol (BREO ELLIPTA) 200-25 MCG/INH AEPB 1 puff 08/10/20   [provider]  fluticasone-salmeterol (ADVAIR HFA) 115-21 MCG/ACT inhaler Inhale 2 puffs into the lungs 2 (two) times daily.    [provider]  gabapentin (NEURONTIN) 100 MG capsule Take 200 mg by mouth at bedtime. (take with 600mg  tablet for equal  800mg )    [provider]  gabapentin (NEURONTIN) 600 MG tablet Take 600 mg by mouth at bedtime. (take with 100mg  capsules to equal 800mg )    [provider]  guaiFENesin (ROBITUSSIN) 100 MG/5ML liquid Take 300 mg by mouth every 6 (six) hours as needed for cough.    [provider]  ibuprofen (ADVIL) 400 MG tablet Take 400 mg by mouth 3 (three) times daily as needed for mild pain or moderate pain.    [provider]  ipratropium-albuterol (DUONEB) 0.5-2.5 (3) MG/3ML SOLN 3 ml as needed 07/21/20   [provider]  levothyroxine (SYNTHROID, LEVOTHROID) 50 MCG tablet Take 50 mcg daily before breakfast by mouth.    [provider]  loperamide (IMODIUM) 2 MG capsule Take 4 mg by mouth as needed for diarrhea or loose stools ((max 8 doses in 24 hours)).    [provider]  losartan (COZAAR) 25 MG tablet Take 25 mg by mouth daily.    [provider]  magnesium hydroxide (MILK OF MAGNESIA) 400 MG/5ML suspension Take 30 mLs by mouth daily as needed for mild constipation or moderate constipation.    [provider]  meloxicam (MOBIC) 7.5 MG tablet Take 7.5 mg by mouth daily. 05/11/20   [provider]  mirabegron ER (MYRBETRIQ) 50 MG TB24 tablet Take 50 mg by mouth daily.    [provider]  montelukast (SINGULAIR) 5 MG chewable tablet Chew 5 mg by mouth at bedtime.    [provider]  Multiple Vitamin (MULTI-VITAMINS) TABS Take 1 tablet by mouth daily.     [provider]  nortriptyline (PAMELOR) 75 MG capsule Take 150 mg by mouth daily.     [provider]  omeprazole (PRILOSEC) 10 MG capsule Take 10 mg by mouth daily.    [provider]  polyethylene glycol (MIRALAX / GLYCOLAX) 17 g packet Take 17 g by mouth daily as needed for mild constipation or moderate constipation.    [provider]  predniSONE (DELTASONE) 5 MG tablet Take by mouth. 09/29/20   [provider]  tamsulosin (FLOMAX) 0.4 MG CAPS capsule Take 0.4 mg by mouth at bedtime.    [provider]  tiZANidine (ZANAFLEX) 2 MG tablet Take by mouth every 6 (six) hours as needed for muscle spasms.    [provider]    Allergies Saw palmetto, Other, Sulfa antibiotics, and Sulfacetamide sodium  Family History  Problem Relation Age of Onset  . Prostate cancer Neg Hx   . Bladder Cancer Neg Hx   . Kidney cancer Neg Hx     Social History Social History   Tobacco Use  . Smoking status: Never Smoker  . Smokeless tobacco: Never Used  Vaping Use  . Vaping Use: Never used  Substance Use Topics  . Alcohol use: No  . Drug use: No    Review of Systems  Constitutional: Positive for chills.  Eyes: No redness. ENT: No sore throat. Cardiovascular: Denies chest pain. Respiratory: Denies shortness of breath. Gastrointestinal: No vomiting or diarrhea.  Genitourinary: Negative for dysuria.  Musculoskeletal: Negative for back pain. Skin: Negative for rash. Neurological: Negative for headache.   ____________________________________________   PHYSICAL EXAM:  VITAL SIGNS: ED Triage Vitals  Enc Vitals Group     BP 10/27/20 1103 (!) 82/52     Pulse Rate 10/27/20 1103 86     Resp 10/27/20 1107 18     Temp 10/27/20 1103 98.2 F (36.8 C)     Temp Source 10/27/20 1103 Oral     SpO2 10/27/20 1103 90 %     Weight 10/27/20 1103 220 lb (99.8 kg)     Height 10/27/20 1103 5\' 8"  (1.727 m)     Head Circumference --      Peak Flow --      Pain Score 10/27/20 1103 0     Pain Loc --      Pain Edu? --      Excl. in [place]? --     Constitutional: Alert and oriented.  Relatively well appearing, in no acute distress. Eyes: Conjunctivae are normal.  Head: Atraumatic. Nose: No congestion/rhinnorhea. Mouth/Throat: Mucous membranes are dry.   Neck: Normal range of motion.  Cardiovascular: Normal rate, regular rhythm. Grossly normal heart sounds.  Good peripheral  circulation. Respiratory: Normal respiratory effort.  No retractions.  Mild rhonchi. Gastrointestinal: Soft and nontender. No distention.  Genitourinary: No flank tenderness. Musculoskeletal: No lower extremity edema.  Extremities warm and well perfused.  Neurologic:  Normal speech and language. No gross focal neurologic deficits are appreciated.  Skin:  Skin is warm and dry. No rash noted. Psychiatric: Mood and affect are normal. Speech and behavior are normal.  ____________________________________________   LABS (all labs ordered are listed, but only abnormal results are displayed)  Labs Reviewed  LACTIC ACID, PLASMA - Abnormal; Notable for the following components:      Result Value   Lactic Acid, Venous 3.7 (*)    All other components within normal limits  COMPREHENSIVE METABOLIC PANEL - Abnormal; Notable for the following components:   CO2 20 (*)    Glucose, Bld 126 (*)    BUN 26 (*)    Creatinine, Ser 2.39 (*)    Calcium 8.6 (*)    Total Protein 6.2 (*)    Albumin 2.9 (*)    Total Bilirubin 1.8 (*)    GFR, Estimated 28 (*)    All other components within normal limits  CBC WITH DIFFERENTIAL/PLATELET - Abnormal; Notable for the following components:   WBC 26.5 (*)    Neutro Abs 23.8 (*)    Monocytes Absolute 1.4 (*)    Abs Immature Granulocytes 0.26 (*)    All other components within normal limits  RESP PANEL BY RT-PCR (FLU A&B, COVID) ARPGX2  CULTURE, BLOOD (ROUTINE X 2)  CULTURE, BLOOD (ROUTINE X 2)  URINE CULTURE  PROTIME-INR  APTT  BRAIN NATRIURETIC PEPTIDE  LACTIC ACID, PLASMA  URINALYSIS, COMPLETE (UACMP) WITH MICROSCOPIC  TROPONIN I (HIGH SENSITIVITY)  TROPONIN I (HIGH SENSITIVITY)   ____________________________________________  EKG  ED ECG REPORT I, Arta Silence, the attending physician, personally viewed and interpreted this ECG.  Date: 10/27/2020 EKG Time: 1231 Rate: 82 Rhythm: normal sinus rhythm QRS Axis: normal Intervals: RBBB,  LAFB ST/T Wave abnormalities: normal Narrative Interpretation: no evidence of acute ischemia  ____________________________________________  RADIOLOGY  CXR interpreted by me shows right upper lobe opacity concerning  for pneumonia  ____________________________________________   PROCEDURES  Procedure(s) performed: No  Procedures  Critical Care performed: Yes  CRITICAL CARE Performed by: Arta Silence   Total critical care time: 35 minutes  Critical care time was exclusive of separately billable procedures and treating other patients.  Critical care was necessary to treat or prevent imminent or life-threatening deterioration.  Critical care was time spent personally by me on the following activities: development of treatment plan with patient and/or surrogate as well as nursing, discussions with consultants, evaluation of patient's response to treatment, examination of patient, obtaining history from patient or surrogate, ordering and performing treatments and interventions, ordering and review of laboratory studies, ordering and review of radiographic studies, pulse oximetry and re-evaluation of patient's condition. ____________________________________________   INITIAL IMPRESSION / ASSESSMENT AND PLAN / ED COURSE  Pertinent labs & imaging results that were available during my care of the patient were reviewed by me and considered in my medical decision making (see chart for details).  74 year old male with PMH as noted above including history of COPD and CKD presents with fever and low blood pressure as low as the 70s over 40s noted by EMS.  The patient has had some chills and has been feeling somewhat weak and lightheaded, but denies other focal complaints.  He reports chronic cough which is unchanged.  I reviewed the past medical records in [place].  The patient was most recent admitted in January for sepsis and left lower lobe pneumonia.  On exam today, the patient is  overall well-appearing.  He is hypotensive with borderline low O2 saturation but other vital signs are normal.  He is afebrile but did receive Tylenol prior to coming to the ED.  He has no evidence of acute respiratory distress.  Lung exam reveals some rhonchi.  Exam is otherwise as described above.  Differential includes pneumonia, COVID-19, influenza, other infection/sepsis, or less likely cardiac or metabolic cause.  We will obtain a chest x-ray, lab work-up, and reassess.  I have ordered antibiotics and fluids per the sepsis protocol.  ----------------------------------------- 1:27 PM on 10/27/2020 -----------------------------------------  Chest x-ray shows evidence of right upper lobe pneumonia, and broad-spectrum antibiotics have been administered.  The blood pressure has improved after fluids.  I discussed the case with Dr. Francine Graven from the hospitalist service for admission.  _______________________  Dakota Harper was evaluated in Emergency Department on 10/27/2020 for the symptoms described in the history of present illness. He was evaluated in the context of the global COVID-19 pandemic, which necessitated consideration that the patient might be at risk for infection with the SARS-CoV-2 virus that causes COVID-19. Institutional protocols and algorithms that pertain to the evaluation of patients at risk for COVID-19 are in a state of rapid change based on information released by regulatory bodies including the CDC and federal and state organizations. These policies and algorithms were followed during the patient's care in the ED.  ____________________________________________   FINAL CLINICAL IMPRESSION(S) / ED DIAGNOSES  Final diagnoses:  Sepsis, due to unspecified organism, unspecified whether acute organ dysfunction present [facility])  Community acquired pneumonia of right upper lobe of lung      NEW MEDICATIONS STARTED DURING THIS VISIT:  New Prescriptions   No medications on  file     Note:  This document was prepared using Dragon voice recognition software and may include unintentional dictation errors.    Arta Silence, MD 10/27/20 1328

## 2020-10-27 NOTE — H&P (Addendum)
History and Physical    Dakota Harper [id number] DOB: 1946-02-16 DOA: 10/27/2020  PCP: Dakota Harper Of   Patient coming from: Skilled nursing facility  I have personally briefly reviewed patient's old medical records in [place]  Chief Complaint: Fever  HPI: Dakota Harper is a 74 y.o. male with medical history significant for hypertension, neuropathy, BPH, COPD who was sent from the skilled nursing facility for evaluation of a fever and low blood pressure.  Patient was said to have a temp of 100.8 F at the skilled nursing facility for which he was treated with Tylenol.  They also documented a low blood pressure. Patient complains of a cough which is productive of dark phlegm.  He has a history of a chronic cough secondary to chronic bronchitis.  He also complains of chills and weakness. He denies having any nausea, no vomiting, no constipation, no diarrhea, no urinary frequency, no nocturia, no dysuria, no dizziness, no lightheadedness, No headache, no chest pain, no abdominal pain Labs show sodium 137, potassium 4.3, chloride 104, bicarb 20, glucose 126, BUN 26, creatinine 2.39 above the baseline of 1.19, calcium 8.6, alkaline phosphatase 78, albumin 2.9, AST 28, ALT 19, total protein 6.2, BNP 93, troponin X, lactic acid 3.7, white count 26.5, hemoglobin 13.6, hematocrit 39.9, MCV 91.2, RDW 14.6, platelet count 227, PT 13.9, INR 1.1 Chest x-ray reviewed by me shows patchy airspace opacity within the right upper lobe, suspicious for pneumonia. Twelve-lead EKG reviewed by me shows sinus rhythm with PACs, right bundle branch block and left anterior fascicular block    ED Course: Patient is a 74 year old Caucasian male who resides in a skilled nursing facility and was brought to the ER for evaluation of a fever and hypotension.  Patient was noted to be septic with elevated lactic acid level and received sepsis IV fluid in the emergency room.  Source of sepsis appears to  be pneumonia.  Patient will be admitted to the hospital for further evaluation.  Review of Systems: As per HPI otherwise all systems reviewed and negative.    Past Medical History:  Diagnosis Date   Asthma    BPH (benign prostatic hyperplasia)    Ear pain    Hypercholesteremia    Hypertension    Neuropathy    Sleep disorder     Past Surgical History:  Procedure Laterality Date   CHOLECYSTECTOMY     HERNIA REPAIR       reports that he has never smoked. He has never used smokeless tobacco. He reports that he does not drink alcohol and does not use drugs.  Allergies  Allergen Reactions   Saw [place] Hives and Rash    "Berry form"   Other Other (See Comments) and Rash   Sulfa Antibiotics Rash   Sulfacetamide Sodium Rash    Family History  Problem Relation Age of Onset   Prostate cancer Neg Hx    Bladder Cancer Neg Hx    Kidney cancer Neg Hx      Prior to Admission medications   Medication Sig Start Date End Date Taking? Authorizing Provider  acetaminophen (TYLENOL) 500 MG tablet Take 650 mg by mouth every 4 (four) hours as needed for mild pain. Take 2 tablets per mouth,prn    [provider]  albuterol (PROVENTIL HFA;VENTOLIN HFA) 108 (90 BASE) MCG/ACT inhaler Inhale 1 puff into the lungs 3 (three) times daily.    [provider]  alum & mag hydroxide-simeth (MAALOX/MYLANTA) 200-200-20 MG/5ML suspension  Take 30 mLs by mouth 4 (four) times daily as needed for indigestion or heartburn.    [provider]  aspirin EC 81 MG tablet Take 81 mg by mouth at bedtime.     [provider]  atorvastatin (LIPITOR) 10 MG tablet Take 10 mg by mouth daily.    [provider]  barrier cream (NON-SPECIFIED) CREA Apply 1 application topically as needed (post-toileting skin protection).    [provider]  carbamide peroxide (EAR WAX REMOVAL DROPS) 6.5 % OTIC solution 5 drops into affected ear    [provider]   chlorhexidine (PERIDEX) 0.12 % solution Use as directed 15 mLs in the mouth or throat 2 (two) times daily as needed (oral discomfort).    [provider]  ciprofloxacin (CIPRO) 250 MG tablet Take 1 tablet (250 mg total) by mouth 2 (two) times daily. 01/23/20   McGowan, Larene [place] A, PA-C  cycloSPORINE (RESTASIS) 0.05 % ophthalmic emulsion Place 1 drop into both eyes 2 (two) times daily as needed (dry eyes).    [provider]  famotidine (PEPCID) 40 MG tablet Take 40 mg by mouth daily.    [provider]  fenofibrate (TRICOR) 145 MG tablet Take 145 mg by mouth at bedtime.    [provider]  finasteride (PROSCAR) 5 MG tablet Take 1 tablet (5 mg total) by mouth daily. 01/22/20   Zara Council A, PA-C  fluocinonide ointment (LIDEX) [id number] % Apply 1 application topically 2 (two) times daily as needed (pruritus).     [provider]  fluticasone furoate-vilanterol (BREO ELLIPTA) 200-25 MCG/INH AEPB 1 puff 08/10/20   [provider]  fluticasone-salmeterol (ADVAIR HFA) 115-21 MCG/ACT inhaler Inhale 2 puffs into the lungs 2 (two) times daily.    [provider]  gabapentin (NEURONTIN) 100 MG capsule Take 200 mg by mouth at bedtime. (take with 600mg  tablet for equal 800mg )    [provider]  gabapentin (NEURONTIN) 600 MG tablet Take 600 mg by mouth at bedtime. (take with 100mg  capsules to equal 800mg )    [provider]  guaiFENesin (ROBITUSSIN) 100 MG/5ML liquid Take 300 mg by mouth every 6 (six) hours as needed for cough.    [provider]  ibuprofen (ADVIL) 400 MG tablet Take 400 mg by mouth 3 (three) times daily as needed for mild pain or moderate pain.    [provider]  ipratropium-albuterol (DUONEB) 0.5-2.5 (3) MG/3ML SOLN 3 ml as needed 07/21/20   [provider]  levothyroxine (SYNTHROID, LEVOTHROID) 50 MCG tablet Take 50 mcg daily before breakfast by mouth.    [provider]   loperamide (IMODIUM) 2 MG capsule Take 4 mg by mouth as needed for diarrhea or loose stools ((max 8 doses in 24 hours)).    [provider]  losartan (COZAAR) 25 MG tablet Take 25 mg by mouth daily.    [provider]  magnesium hydroxide (MILK OF MAGNESIA) 400 MG/5ML suspension Take 30 mLs by mouth daily as needed for mild constipation or moderate constipation.    [provider]  meloxicam (MOBIC) 7.5 MG tablet Take 7.5 mg by mouth daily. 05/11/20   [provider]  mirabegron ER (MYRBETRIQ) 50 MG TB24 tablet Take 50 mg by mouth daily.    [provider]  montelukast (SINGULAIR) 5 MG chewable tablet Chew 5 mg by mouth at bedtime.    [provider]  Multiple Vitamin (MULTI-VITAMINS) TABS Take 1 tablet by mouth daily.  [provider]  nortriptyline (PAMELOR) 75 MG capsule Take 150 mg by mouth daily.     [provider]  omeprazole (PRILOSEC) 10 MG capsule Take 10 mg by mouth daily.    [provider]  polyethylene glycol (MIRALAX / GLYCOLAX) 17 g packet Take 17 g by mouth daily as needed for mild constipation or moderate constipation.    [provider]  predniSONE (DELTASONE) 5 MG tablet Take by mouth. 09/29/20   [provider]  tamsulosin (FLOMAX) 0.4 MG CAPS capsule Take 0.4 mg by mouth at bedtime.    [provider]  tiZANidine (ZANAFLEX) 2 MG tablet Take by mouth every 6 (six) hours as needed for muscle spasms.    [provider]    Physical Exam: Vitals:   10/27/20 1400 10/27/20 1415 10/27/20 1430 10/27/20 1445  BP: (!) 117/91 (!) 100/57 111/61 (!) 106/57  Pulse: 82 80 80 79  Resp: (!) 24 (!) 23 18 16   Temp:      TempSrc:      SpO2: 94% 93% 98% 99%  Weight:      Height:         Vitals:   10/27/20 1400 10/27/20 1415 10/27/20 1430 10/27/20 1445  BP: (!) 117/91 (!) 100/57 111/61 (!) 106/57  Pulse: 82 80 80 79  Resp: (!) 24 (!) 23 18 16   Temp:      TempSrc:       SpO2: 94% 93% 98% 99%  Weight:      Height:        Constitutional: NAD, alert and oriented x 3 Eyes: PERRL, lids and conjunctivae normal ENMT: Mucous membranes are moist.  Neck: normal, supple, no masses, no thyromegaly Respiratory: Bilateral air entry, no wheezing, no crackles, scattered rhonchi. Normal respiratory effort. No accessory muscle use.  Cardiovascular: Regular rate and rhythm, no murmurs / rubs / gallops. No extremity edema. 2+ pedal pulses. No carotid bruits.  Abdomen: no tenderness, no masses palpated. No hepatosplenomegaly. Bowel sounds positive.  Central adiposity Musculoskeletal: no clubbing / cyanosis. No joint deformity upper and lower extremities.  Skin: no rashes, lesions, ulcers.  Neurologic: No gross focal neurologic deficit.  Generalized weakness Psychiatric: Normal mood and affect.   Labs on Admission: I have personally reviewed following labs and imaging studies  CBC: Recent Labs  Lab 10/27/20 1110  WBC 26.5*  NEUTROABS 23.8*  HGB 13.6  HCT 39.6  MCV 91.2  PLT [id number]   Basic Metabolic Panel: Recent Labs  Lab 10/27/20 1110  NA 137  K 4.3  CL 104  CO2 20*  GLUCOSE 126*  BUN 26*  CREATININE 2.39*  CALCIUM 8.6*   GFR: Estimated Creatinine Clearance: 31.1 mL/min (A) (by C-G formula based on SCr of 2.39 mg/dL (H)). Liver Function Tests: Recent Labs  Lab 10/27/20 1110  AST 28  ALT 19  ALKPHOS 78  BILITOT 1.8*  PROT 6.2*  ALBUMIN 2.9*   No results for input(s): LIPASE, AMYLASE in the last 168 hours. No results for input(s): AMMONIA in the last 168 hours. Coagulation Profile: Recent Labs  Lab 10/27/20 1110  INR 1.1   Cardiac Enzymes: No results for input(s): CKTOTAL, CKMB, CKMBINDEX, TROPONINI in the last 168 hours. BNP (last 3 results) No results for input(s): PROBNP in the last 8760 hours. HbA1C: No results for input(s): HGBA1C in the last 72 hours. CBG: No results for input(s): GLUCAP in the last 168 hours. Lipid  Profile: No results for input(s): CHOL, HDL, LDLCALC, TRIG,  CHOLHDL, LDLDIRECT in the last 72 hours. Thyroid Function Tests: No results for input(s): TSH, T4TOTAL, FREET4, T3FREE, THYROIDAB in the last 72 hours. Anemia Panel: No results for input(s): VITAMINB12, FOLATE, FERRITIN, TIBC, IRON, RETICCTPCT in the last 72 hours. Urine analysis:    Component Value Date/Time   COLORURINE YELLOW (A) 04/02/2016 1522   APPEARANCEUR Clear 09/17/2017 0905   LABSPEC 1.021 04/02/2016 1522   PHURINE 5.0 04/02/2016 1522   GLUCOSEU Negative 09/17/2017 0905   HGBUR 2+ (A) 04/02/2016 1522   BILIRUBINUR Negative 09/17/2017 0905   KETONESUR NEGATIVE 04/02/2016 1522   PROTEINUR 2+ (A) 09/17/2017 0905   PROTEINUR 30 (A) 04/02/2016 1522   NITRITE Negative 09/17/2017 0905   NITRITE NEGATIVE 04/02/2016 1522   LEUKOCYTESUR Negative 09/17/2017 0905    Radiological Exams on Admission: DG Chest Port 1 View  Result Date: 10/27/2020 CLINICAL DATA:  Fever EXAM: PORTABLE CHEST 1 VIEW COMPARISON:  11/25/2019 FINDINGS: Heart size within normal limits. Low lung volumes. Atherosclerotic calcification of the aortic knob. Patchy airspace opacity within the right upper lobe. Bibasilar interstitial prominence. No large pleural fluid collection. No pneumothorax. IMPRESSION: Patchy airspace opacity within the right upper lobe, suspicious for pneumonia. Electronically Signed   By: Davina Poke D.O.   On: 10/27/2020 11:31    EKG: Independently reviewed.  Sinus rhythm with PACs Right bundle branch block and left anterior fascicular block.  Assessment/Plan Principal Problem:   Severe sepsis (HCC) Active Problems:   Hypothyroidism   BPH (benign prostatic hyperplasia)   Pneumonia   AKI (acute kidney injury) ([place])   COPD with chronic bronchitis (HCC)    Severe sepsis from pneumonia (PO[facility]) As evidenced by fever with a T-max of 100.8 F, tachypnea with respiratory rate of 24, hypotension requiring IV fluid  resuscitation, marked leukocytosis of 26,000 with a left shift and elevated lactic acid of 3.7 Source of sepsis appears to be right upper lobe pneumonia rule out aspiration pneumonia Patient received sepsis fluids per protocol in the ER with improvement in his blood pressure Continue IV fluids Place patient on Rocephin and Zithromax Follow-up results of blood cultures Speech therapy consult for swallow function evaluation    Acute kidney injury Most likely secondary to ATN from hypotension At baseline patient has a serum creatinine of 1.19 and today on admission it is 2.39 Hold losartan Continue IV fluid hydration Repeat renal parameters in a.m.    BPH Continue Flomax    Hypothyroidism Continue Synthroid     COPD Not acutely exacerbated Continue as needed bronchodilator therapy Continue inhaled steroids     DVT prophylaxis: Lovenox Code Status: Full code Family Communication: Greater than 50% of time was spent discussing plan of care with patient at the bedside.  All questions and concerns have been addressed.  He verbalizes understanding and agrees with the plan.  CODE STATUS was discussed and he wishes to be a full code. Disposition Plan: Back to previous home environment Consults called: None    Eliaz Fout MD [facility]     10/27/2020, 3:25 PM

## 2020-10-27 NOTE — Sepsis Progress Note (Signed)
Notified bedside nurse of need to draw repeat lactic acid. 

## 2020-10-28 DIAGNOSIS — A419 Sepsis, unspecified organism: Secondary | ICD-10-CM | POA: Diagnosis present

## 2020-10-28 DIAGNOSIS — R6520 Severe sepsis without septic shock: Secondary | ICD-10-CM

## 2020-10-28 LAB — CBC
HCT: 39.2 % (ref 39.0–52.0)
Hemoglobin: 13.5 g/dL (ref 13.0–17.0)
MCH: 31.6 pg (ref 26.0–34.0)
MCHC: 34.4 g/dL (ref 30.0–36.0)
MCV: 91.8 fL (ref 80.0–100.0)
Platelets: 200 10*3/uL (ref 150–400)
RBC: 4.27 MIL/uL (ref 4.22–5.81)
RDW: 14.6 % (ref 11.5–15.5)
WBC: 20 10*3/uL — ABNORMAL HIGH (ref 4.0–10.5)
nRBC: 0 % (ref 0.0–0.2)

## 2020-10-28 LAB — BASIC METABOLIC PANEL
Anion gap: 9 (ref 5–15)
BUN: 23 mg/dL (ref 8–23)
CO2: 25 mmol/L (ref 22–32)
Calcium: 8.5 mg/dL — ABNORMAL LOW (ref 8.9–10.3)
Chloride: 104 mmol/L (ref 98–111)
Creatinine, Ser: 1.6 mg/dL — ABNORMAL HIGH (ref 0.61–1.24)
GFR, Estimated: 45 mL/min — ABNORMAL LOW (ref >60)
Glucose, Bld: 90 mg/dL (ref 70–99)
Potassium: 4.2 mmol/L (ref 3.5–5.1)
Sodium: 138 mmol/L (ref 135–145)

## 2020-10-28 LAB — LACTIC ACID, PLASMA: Lactic Acid, Venous: 1.5 mmol/L (ref 0.5–1.9)

## 2020-10-28 LAB — PROTIME-INR
INR: 1.2 (ref 0.8–1.2)
Prothrombin Time: 14.9 seconds (ref 11.4–15.2)

## 2020-10-28 LAB — PROCALCITONIN: Procalcitonin: 7.3 ng/mL

## 2020-10-28 LAB — CORTISOL-AM, BLOOD: Cortisol - AM: 12.4 ug/dL (ref 6.7–22.6)

## 2020-10-28 MED ORDER — IPRATROPIUM-ALBUTEROL 0.5-2.5 (3) MG/3ML IN SOLN
3.0000 mL | Freq: Four times a day (QID) | RESPIRATORY_TRACT | Status: DC
  Administered 2020-10-28 – 2020-10-29 (×6): 3 mL via RESPIRATORY_TRACT
  Filled 2020-10-28 (×6): qty 3

## 2020-10-28 MED ORDER — ARFORMOTEROL TARTRATE 15 MCG/2ML IN NEBU
15.0000 ug | INHALATION_SOLUTION | Freq: Two times a day (BID) | RESPIRATORY_TRACT | Status: DC
  Administered 2020-10-28 – 2020-11-01 (×8): 15 ug via RESPIRATORY_TRACT
  Filled 2020-10-28 (×10): qty 2

## 2020-10-28 MED ORDER — ALBUTEROL SULFATE HFA 108 (90 BASE) MCG/ACT IN AERS
1.0000 | INHALATION_SPRAY | RESPIRATORY_TRACT | Status: DC | PRN
  Administered 2020-10-31 (×3): 2 via RESPIRATORY_TRACT
  Filled 2020-10-28 (×2): qty 6.7

## 2020-10-28 MED ORDER — HYDROCORTISONE NA SUCCINATE PF 100 MG IJ SOLR
50.0000 mg | Freq: Four times a day (QID) | INTRAMUSCULAR | Status: DC
  Administered 2020-10-28 – 2020-10-30 (×8): 50 mg via INTRAVENOUS
  Filled 2020-10-28 (×5): qty 1
  Filled 2020-10-28: qty 2
  Filled 2020-10-28 (×2): qty 1
  Filled 2020-10-28: qty 2
  Filled 2020-10-28: qty 1

## 2020-10-28 MED ORDER — FLUOCINONIDE 0.05 % EX OINT
TOPICAL_OINTMENT | Freq: Two times a day (BID) | CUTANEOUS | Status: DC
  Administered 2020-10-28 – 2020-10-29 (×2): 1 via TOPICAL
  Filled 2020-10-28: qty 15

## 2020-10-28 MED ORDER — BUDESONIDE 0.25 MG/2ML IN SUSP
0.2500 mg | Freq: Two times a day (BID) | RESPIRATORY_TRACT | Status: DC
  Administered 2020-10-28 – 2020-11-01 (×9): 0.25 mg via RESPIRATORY_TRACT
  Filled 2020-10-28 (×9): qty 2

## 2020-10-28 MED ORDER — ALUM & MAG HYDROXIDE-SIMETH 200-200-20 MG/5ML PO SUSP
30.0000 mL | ORAL | Status: DC | PRN
  Administered 2020-10-29 – 2020-10-30 (×4): 30 mL via ORAL
  Filled 2020-10-28 (×4): qty 30

## 2020-10-28 MED ORDER — FLUOCINONIDE 0.05 % EX SOLN
1.0000 "application " | Freq: Two times a day (BID) | CUTANEOUS | Status: DC
  Filled 2020-10-28: qty 60

## 2020-10-28 NOTE — ED Notes (Signed)
ED TO INPATIENT HANDOFF REPORT  ED Nurse Name and Phone #: Michai Dieppa [phone]  S Name/Age/Gender Dakota Harper 74 y.o. male Room/Bed: ED35A/ED35A  Code Status   Code Status: Full Code  Home/SNF/Other Skilled nursing facility Patient oriented to: self, place, time and situation Is this baseline? Yes   Triage Complete: Triage complete  Chief Complaint Severe sepsis ([place]) [A41.9, R65.20] Septic shock ([place]) [A41.9, R65.21] Sepsis due to pneumonia ([place]) [J18.9, A41.9]  Triage Note Pt arrived via ems from the [facility]. Ems reports called out for temp of 101.8 reportedly taken at 1000 prior to ems called. At 0700 temp of 98.1. ems reports staff at facility gave tylenol "just because".  ems arrived  Dizzy when standing but hx of vertigo. Flu like symptoms reported by pt x 3 days, productive cough, sore throat. A&o x 4 on arrival, NAD noted at this time  90-92% RA, pt denied any SOB ems BP 74/42 HR 88 147 cbg  20g left forearm. 272ml NS prior to arrival     Allergies Allergies  Allergen Reactions  . Saw Palmetto Hives and Rash    "Berry form"  . Other Other (See Comments) and Rash  . Sulfa Antibiotics Rash  . Sulfacetamide Sodium Rash    Level of Care/Admitting Diagnosis ED Disposition    ED Disposition Condition [facility] Area: [place] [[id number]]  Level of Care: Med-Surg [16]  Covid Evaluation: Confirmed COVID Negative  Diagnosis: Sepsis due to pneumonia [facility]) [id number]  Admitting Physician: Sidney Ace [id number]  Attending Physician: Sidney Ace [id number]  Estimated length of stay: past midnight tomorrow  Certification:: I certify this patient will need inpatient services for at least 2 midnights       B Medical/Surgery History Past Medical History:  Diagnosis Date  . Asthma   . BPH (benign prostatic hyperplasia)   . Ear pain   . Hypercholesteremia   . Hypertension   . Neuropathy   . Sleep disorder     Past Surgical History:  Procedure Laterality Date  . CHOLECYSTECTOMY    . HERNIA REPAIR       A IV Location/Drains/Wounds Patient Lines/Drains/Airways Status    Active Line/Drains/Airways    Name Placement date Placement time Site Days   Peripheral IV 10/27/20 Right Forearm 10/27/20  1114  Forearm  1   Peripheral IV 10/27/20 Right Hand 10/27/20  1110  Hand  1          Intake/Output Last 24 hours  Intake/Output Summary (Last 24 hours) at 10/28/2020 1646 Last data filed at 10/28/2020 1631 Gross per 24 hour  Intake 1000 ml  Output 1000 ml  Net 0 ml    Labs/Imaging Results for orders placed or performed during the hospital encounter of 10/27/20 (from the past 48 hour(s))  Lactic acid, plasma     Status: Abnormal   Collection Time: 10/27/20 11:10 AM  Result Value Ref Range   Lactic Acid, Venous 3.7 (HH) 0.5 - 1.9 mmol/L    Comment: CRITICAL RESULT CALLED TO, READ BACK BY AND VERIFIED WITH  REED RENO AT 1155 10/27/20 SDR Performed at [facility], [address] [place],  [address]   Comprehensive metabolic panel     Status: Abnormal   Collection Time: 10/27/20 11:10 AM  Result Value Ref Range   Sodium 137 135 - 145 mmol/L   Potassium 4.3 3.5 - 5.1 mmol/L   Chloride 104 98 - 111 mmol/L   CO2  20 (L) 22 - 32 mmol/L   Glucose, Bld 126 (H) 70 - 99 mg/dL    Comment: Glucose reference range applies only to samples taken after fasting for at least 8 hours.   BUN 26 (H) 8 - 23 mg/dL   Creatinine, Ser 2.39 (H) 0.61 - 1.24 mg/dL   Calcium 8.6 (L) 8.9 - 10.3 mg/dL   Total Protein 6.2 (L) 6.5 - 8.1 g/dL   Albumin 2.9 (L) 3.5 - 5.0 g/dL   AST 28 15 - 41 U/L   ALT 19 0 - 44 U/L   Alkaline Phosphatase 78 38 - 126 U/L   Total Bilirubin 1.8 (H) 0.3 - 1.2 mg/dL   GFR, Estimated 28 (L) >60 mL/min    Comment: (NOTE) Calculated using the CKD-EPI Creatinine Equation (2021)    Anion gap 13 5 - 15    Comment: Performed at [facility], [address] [place], [place] [address]  CBC WITH DIFFERENTIAL     Status: Abnormal   Collection Time: 10/27/20 11:10 AM  Result Value Ref Range   WBC 26.5 (H) 4.0 - 10.5 K/uL   RBC 4.34 4.22 - 5.81 MIL/uL   Hemoglobin 13.6 13.0 - 17.0 g/dL   HCT 39.6 39.0 - 52.0 %   MCV 91.2 80.0 - 100.0 fL   MCH 31.3 26.0 - 34.0 pg   MCHC 34.3 30.0 - 36.0 g/dL   RDW 14.6 11.5 - 15.5 %   Platelets 227 150 - 400 K/uL   nRBC 0.0 0.0 - 0.2 %   Neutrophils Relative % 90 %   Neutro Abs 23.8 (H) 1.7 - 7.7 K/uL   Lymphocytes Relative 4 %   Lymphs Abs 0.9 0.7 - 4.0 K/uL   Monocytes Relative 5 %   Monocytes Absolute 1.4 (H) 0.1 - 1.0 K/uL   Eosinophils Relative 0 %   Eosinophils Absolute 0.0 0.0 - 0.5 K/uL   Basophils Relative 0 %   Basophils Absolute 0.1 0.0 - 0.1 K/uL   WBC Morphology MILD LEFT SHIFT (1-5% METAS, OCC MYELO, OCC BANDS)    RBC Morphology MORPHOLOGY UNREMARKABLE    Smear Review Normal platelet morphology    Immature Granulocytes 1 %   Abs Immature Granulocytes 0.26 (H) 0.00 - 0.07 K/uL    Comment: Performed at [facility], [address] [place], [place] [address]  Protime-INR     Status: None   Collection Time: 10/27/20 11:10 AM  Result Value Ref Range   Prothrombin Time 13.9 11.4 - 15.2 seconds   INR 1.1 0.8 - 1.2    Comment: (NOTE) INR goal varies based on device and disease states. Performed at [facility], [address] [place], [place] [address]   APTT     Status: None   Collection Time: 10/27/20 11:10 AM  Result Value Ref Range   aPTT 32 24 - 36 seconds    Comment: Performed at [facility], [address] [place], [place] [address]  Blood Culture (routine x 2)     Status: None (Preliminary result)   Collection Time: 10/27/20 11:10 AM   Specimen: BLOOD  Result Value Ref Range   Specimen Description BLOOD BLOOD RIGHT FOREARM    Special Requests      BOTTLES DRAWN AEROBIC AND ANAEROBIC Blood Culture adequate volume   Culture      NO GROWTH < 24  HOURS Performed at [facility], [address], [place], [place] [address]    Report  Status PENDING   Blood Culture (routine x 2)     Status: None (Preliminary result)   Collection Time: 10/27/20 11:10 AM   Specimen: BLOOD  Result Value Ref Range   Specimen Description BLOOD BLOOD RIGHT HAND    Special Requests      BOTTLES DRAWN AEROBIC AND ANAEROBIC Blood Culture adequate volume   Culture      NO GROWTH < 24 HOURS Performed at [facility] [address], [place], [place] [address]    Report Status PENDING   Brain natriuretic peptide     Status: None   Collection Time: 10/27/20 11:10 AM  Result Value Ref Range   B Natriuretic Peptide 93.9 0.0 - 100.0 pg/mL    Comment: Performed at [facility], [address] [place], [place] [address]  Troponin I (High Sensitivity)     Status: None   Collection Time: 10/27/20 11:10 AM  Result Value Ref Range   Troponin I (High Sensitivity) 10 <18 ng/L    Comment: (NOTE) Elevated high sensitivity troponin I (hsTnI) values and significant  changes across serial measurements may suggest ACS but many other  chronic and acute conditions are known to elevate hsTnI results.  Refer to the "Links" section for chest pain algorithms and additional  guidance. Performed at [facility], [address] [place], [place] [address]   Resp Panel by RT-PCR (Flu A&B, Covid) Nasopharyngeal Swab     Status: None   Collection Time: 10/27/20 11:10 AM   Specimen: Nasopharyngeal Swab; Nasopharyngeal(NP) swabs in vial transport medium  Result Value Ref Range   SARS Coronavirus 2 by RT PCR NEGATIVE NEGATIVE    Comment: (NOTE) SARS-CoV-2 target nucleic acids are NOT DETECTED.  The SARS-CoV-2 RNA is generally detectable in upper respiratory specimens during the acute phase of infection. The lowest concentration of SARS-CoV-2 viral copies this assay can detect is 138 copies/mL. A negative result does not preclude  SARS-Cov-2 infection and should not be used as the sole basis for treatment or other patient management decisions. A negative result may occur with  improper specimen collection/handling, submission of specimen other than nasopharyngeal swab, presence of viral mutation(s) within the areas targeted by this assay, and inadequate number of viral copies(<138 copies/mL). A negative result must be combined with clinical observations, patient history, and epidemiological information. The expected result is Negative.  Fact Sheet for Patients:  EntrepreneurPulse.com.au  Fact Sheet for Healthcare Providers:  IncredibleEmployment.be  This test is no t yet approved or cleared by the [place] FDA and  has been authorized for detection and/or diagnosis of SARS-CoV-2 by FDA under an Emergency Use Authorization (EUA). This EUA will remain  in effect (meaning this test can be used) for the duration of the COVID-19 declaration under Section 564(b)(1) of the Act, 21 U.S.C.section 360bbb-3(b)(1), unless the authorization is terminated  or revoked sooner.       Influenza A by PCR NEGATIVE NEGATIVE   Influenza B by PCR NEGATIVE NEGATIVE    Comment: (NOTE) The Xpert Xpress SARS-CoV-2/FLU/RSV plus assay is intended as an aid in the diagnosis of influenza from Nasopharyngeal swab specimens and should not be used as a sole basis for treatment. Nasal washings and aspirates are unacceptable for Xpert Xpress SARS-CoV-2/FLU/RSV testing.  Fact Sheet for Patients: EntrepreneurPulse.com.au  Fact Sheet for Healthcare Providers: IncredibleEmployment.be  This test is not yet approved or cleared by the [place] FDA and has been authorized for detection and/or diagnosis of SARS-CoV-2 by FDA under  an Emergency Use Authorization (EUA). This EUA will remain in effect (meaning this test can be used) for the duration of the COVID-19  declaration under Section 564(b)(1) of the Act, 21 U.S.C. section 360bbb-3(b)(1), unless the authorization is terminated or revoked.  Performed at [facility], [address] [place], [place] [address]   Lactic acid, plasma     Status: Abnormal   Collection Time: 10/27/20  1:55 PM  Result Value Ref Range   Lactic Acid, Venous 3.1 (HH) 0.5 - 1.9 mmol/L    Comment: CRITICAL VALUE NOTED. VALUE IS CONSISTENT WITH PREVIOUSLY REPORTED/CALLED VALUE MF/MJU Performed at [facility], [place], [place] [address]   Troponin I (High Sensitivity)     Status: None   Collection Time: 10/27/20  1:55 PM  Result Value Ref Range   Troponin I (High Sensitivity) 12 <18 ng/L    Comment: (NOTE) Elevated high sensitivity troponin I (hsTnI) values and significant  changes across serial measurements may suggest ACS but many other  chronic and acute conditions are known to elevate hsTnI results.  Refer to the "Links" section for chest pain algorithms and additional  guidance. Performed at [facility], [address] [place], [place] [address]   Procalcitonin - Baseline     Status: None   Collection Time: 10/27/20  6:32 PM  Result Value Ref Range   Procalcitonin 8.10 ng/mL    Comment:        Interpretation: PCT > 2 ng/mL: Systemic infection (sepsis) is likely, unless other causes are known. (NOTE)       Sepsis PCT Algorithm           Lower Respiratory Tract                                      Infection PCT Algorithm    ----------------------------     ----------------------------         PCT < 0.25 ng/mL                PCT < 0.10 ng/mL          Strongly encourage             Strongly discourage   discontinuation of antibiotics    initiation of antibiotics    ----------------------------     -----------------------------       PCT 0.25 - 0.50 ng/mL            PCT 0.10 - 0.25 ng/mL               OR       >80% decrease in PCT            Discourage  initiation of                                            antibiotics      Encourage discontinuation           of antibiotics    ----------------------------     -----------------------------         PCT >= 0.50 ng/mL              PCT 0.26 - 0.50 ng/mL               AND       <  80% decrease in PCT              Encourage initiation of                                             antibiotics       Encourage continuation           of antibiotics    ----------------------------     -----------------------------        PCT >= 0.50 ng/mL                  PCT > 0.50 ng/mL               AND         increase in PCT                  Strongly encourage                                      initiation of antibiotics    Strongly encourage escalation           of antibiotics                                     -----------------------------                                           PCT <= 0.25 ng/mL                                                 OR                                        > 80% decrease in PCT                                      Discontinue / Do not initiate                                             antibiotics  Performed at [facility], [address] [place], [place] [address]   Lactic acid, plasma     Status: None   Collection Time: 10/27/20  6:32 PM  Result Value Ref Range   Lactic Acid, Venous 1.5 0.5 - 1.9 mmol/L    Comment: Performed at [facility], [address] [place], [place] [address]  Urinalysis, Complete w Microscopic Urine, Clean Catch     Status: Abnormal   Collection Time: 10/27/20  8:51 PM  Result Value Ref Range   Color, Urine YELLOW (A) YELLOW   APPearance CLOUDY (A) CLEAR   Specific Gravity, Urine  1.017 1.005 - 1.030   pH 5.0 5.0 - 8.0   Glucose, UA NEGATIVE NEGATIVE mg/dL   Hgb urine dipstick NEGATIVE NEGATIVE   Bilirubin Urine NEGATIVE NEGATIVE   Ketones, ur NEGATIVE NEGATIVE mg/dL   Protein, ur NEGATIVE NEGATIVE mg/dL    Nitrite NEGATIVE NEGATIVE   Leukocytes,Ua NEGATIVE NEGATIVE   RBC / HPF 0-5 0 - 5 RBC/hpf   WBC, UA 0-5 0 - 5 WBC/hpf   Bacteria, UA NONE SEEN NONE SEEN   Squamous Epithelial / LPF 0-5 0 - 5   Mucus PRESENT    Hyaline Casts, UA PRESENT     Comment: Performed at [facility], [address] [place], [place] [address]  Protime-INR     Status: None   Collection Time: 10/28/20  3:36 AM  Result Value Ref Range   Prothrombin Time 14.9 11.4 - 15.2 seconds   INR 1.2 0.8 - 1.2    Comment: (NOTE) INR goal varies based on device and disease states. Performed at [facility], [address] [place], [place] [address]   Cortisol-am, blood     Status: None   Collection Time: 10/28/20  3:36 AM  Result Value Ref Range   Cortisol - AM 12.4 6.7 - 22.6 ug/dL    Comment: Performed at [facility] [address], [place],  [address]  Procalcitonin     Status: None   Collection Time: 10/28/20  3:36 AM  Result Value Ref Range   Procalcitonin 7.30 ng/mL    Comment:        Interpretation: PCT > 2 ng/mL: Systemic infection (sepsis) is likely, unless other causes are known. (NOTE)       Sepsis PCT Algorithm           Lower Respiratory Tract                                      Infection PCT Algorithm    ----------------------------     ----------------------------         PCT < 0.25 ng/mL                PCT < 0.10 ng/mL          Strongly encourage             Strongly discourage   discontinuation of antibiotics    initiation of antibiotics    ----------------------------     -----------------------------       PCT 0.25 - 0.50 ng/mL            PCT 0.10 - 0.25 ng/mL               OR       >80% decrease in PCT            Discourage initiation of                                            antibiotics      Encourage discontinuation           of antibiotics    ----------------------------     -----------------------------         PCT >= 0.50 ng/mL              PCT  0.26 -  0.50 ng/mL               AND       <80% decrease in PCT              Encourage initiation of                                             antibiotics       Encourage continuation           of antibiotics    ----------------------------     -----------------------------        PCT >= 0.50 ng/mL                  PCT > 0.50 ng/mL               AND         increase in PCT                  Strongly encourage                                      initiation of antibiotics    Strongly encourage escalation           of antibiotics                                     -----------------------------                                           PCT <= 0.25 ng/mL                                                 OR                                        > 80% decrease in PCT                                      Discontinue / Do not initiate                                             antibiotics  Performed at [facility], [address] [place], [place] [id number]   Basic metabolic panel     Status: Abnormal   Collection Time: 10/28/20  3:36 AM  Result Value Ref Range   Sodium 138 135 - 145 mmol/L   Potassium 4.2 3.5 - 5.1 mmol/L   Chloride 104 98 - 111 mmol/L   CO2 25 22 - 32 mmol/L   Glucose, Bld 90 70 - 99 mg/dL    Comment: Glucose reference range  applies only to samples taken after fasting for at least 8 hours.   BUN 23 8 - 23 mg/dL   Creatinine, Ser 1.60 (H) 0.61 - 1.24 mg/dL   Calcium 8.5 (L) 8.9 - 10.3 mg/dL   GFR, Estimated 45 (L) >60 mL/min    Comment: (NOTE) Calculated using the CKD-EPI Creatinine Equation (2021)    Anion gap 9 5 - 15    Comment: Performed at [facility], [address] [place], [place] [address]  CBC     Status: Abnormal   Collection Time: 10/28/20  3:36 AM  Result Value Ref Range   WBC 20.0 (H) 4.0 - 10.5 K/uL   RBC 4.27 4.22 - 5.81 MIL/uL   Hemoglobin 13.5 13.0 - 17.0 g/dL   HCT 39.2 39.0 - 52.0 %   MCV 91.8 80.0 - 100.0 fL    MCH 31.6 26.0 - 34.0 pg   MCHC 34.4 30.0 - 36.0 g/dL   RDW 14.6 11.5 - 15.5 %   Platelets 200 150 - 400 K/uL   nRBC 0.0 0.0 - 0.2 %    Comment: Performed at [facility], [address] [place], [place] [address]  Lactic acid, plasma     Status: None   Collection Time: 10/28/20  3:36 AM  Result Value Ref Range   Lactic Acid, Venous 1.5 0.5 - 1.9 mmol/L    Comment: Performed at [facility], [address], [place], [place] [address]   DG Chest Port 1 View  Result Date: 10/27/2020 CLINICAL DATA:  Fever EXAM: PORTABLE CHEST 1 VIEW COMPARISON:  11/25/2019 FINDINGS: Heart size within normal limits. Low lung volumes. Atherosclerotic calcification of the aortic knob. Patchy airspace opacity within the right upper lobe. Bibasilar interstitial prominence. No large pleural fluid collection. No pneumothorax. IMPRESSION: Patchy airspace opacity within the right upper lobe, suspicious for pneumonia. Electronically Signed   By: Davina Poke D.O.   On: 10/27/2020 11:31    Pending Labs Unresulted Labs (From admission, onward)          Start     Ordered   11/03/20 0500  Creatinine, serum  (enoxaparin (LOVENOX)    CrCl >/= 30 ml/min)  Weekly,   STAT     Comments: while on enoxaparin therapy    10/27/20 1330   10/27/20 1757  Respiratory Panel by PCR  (Respiratory virus panel with precautions)  ONCE - STAT,   STAT        10/27/20 1757   10/27/20 1757  Expectorated sputum assessment w rflx to resp cult  ONCE - STAT,   STAT        10/27/20 1757   10/27/20 1110  Urine culture  (Septic presentation on arrival (screening labs, nursing and treatment orders for obvious sepsis))  ONCE - STAT,   STAT        10/27/20 1110          Vitals/Pain Today's Vitals   10/28/20 1500 10/28/20 1600 10/28/20 1645 10/28/20 1646  BP: 116/60 121/61    Pulse: 96 90    Resp: 19 18 (!) 24   Temp:    98.1 F (36.7 C)  TempSrc:    Oral  SpO2: 96% 97%    Weight:      Height:      PainSc: 0-No  pain 0-No pain  0-No pain    Isolation Precautions No active isolations  Medications Medications  acetaminophen (TYLENOL) tablet 650 mg (has no administration in time range)  aspirin EC tablet 81 mg (81 mg Oral Given 10/27/20 2314)  atorvastatin (LIPITOR) tablet 10 mg (10 mg Oral Given 10/28/20 1055)  fenofibrate tablet 54 mg (54 mg Oral Given 10/28/20 1056)  nortriptyline (PAMELOR) capsule 150 mg (150 mg Oral Given 10/28/20 1057)  levothyroxine (SYNTHROID) tablet 50 mcg (50 mcg Oral Given 10/28/20 0631)  famotidine (PEPCID) tablet 40 mg (40 mg Oral Given 10/27/20 2312)  finasteride (PROSCAR) tablet 5 mg (5 mg Oral Given 10/28/20 1057)  mirabegron ER (MYRBETRIQ) tablet 50 mg (50 mg Oral Given 10/28/20 1055)  tamsulosin (FLOMAX) capsule 0.4 mg (0.4 mg Oral Given 10/27/20 2312)  gabapentin (NEURONTIN) tablet 600 mg (600 mg Oral Given 10/27/20 2314)  tiZANidine (ZANAFLEX) tablet 2 mg (has no administration in time range)  multivitamin with minerals tablet 1 tablet (1 tablet Oral Given 10/28/20 1056)  montelukast (SINGULAIR) tablet 5 mg (5 mg Oral Given 10/27/20 2324)  cycloSPORINE (RESTASIS) 0.05 % ophthalmic emulsion 1 drop (has no administration in time range)  enoxaparin (LOVENOX) injection 40 mg (40 mg Subcutaneous Given 10/27/20 2313)  0.9 %  sodium chloride infusion ( Intravenous Rate/Dose Change 10/28/20 0940)  cefTRIAXone (ROCEPHIN) 2 g in sodium chloride 0.9 % 100 mL IVPB (0 g Intravenous Stopped 10/28/20 0015)  azithromycin (ZITHROMAX) 500 mg in sodium chloride 0.9 % 250 mL IVPB (500 mg Intravenous New Bag/Given 10/28/20 1316)  ondansetron (ZOFRAN) tablet 4 mg (has no administration in time range)    Or  ondansetron (ZOFRAN) injection 4 mg (has no administration in time range)  midodrine (PROAMATINE) tablet 10 mg (10 mg Oral Given 10/28/20 1310)  ipratropium-albuterol (DUONEB) 0.5-2.5 (3) MG/3ML nebulizer solution 3 mL (3 mLs Nebulization Given 10/28/20 1310)  arformoterol  (BROVANA) nebulizer solution 15 mcg (15 mcg Nebulization Given 10/28/20 1131)  budesonide (PULMICORT) nebulizer solution 0.25 mg (0.25 mg Nebulization Given 10/28/20 1101)  albuterol (VENTOLIN HFA) 108 (90 Base) MCG/ACT inhaler 1-2 puff (has no administration in time range)  hydrocortisone sodium succinate (SOLU-CORTEF) 100 MG injection 50 mg (50 mg Intravenous Given 10/28/20 1312)  lactated ringers bolus 1,000 mL (0 mLs Intravenous Stopped 10/27/20 1223)    And  lactated ringers bolus 1,000 mL (0 mLs Intravenous Stopped 10/27/20 1354)    And  lactated ringers bolus 1,000 mL (0 mLs Intravenous Stopped 10/27/20 1354)  ceFEPIme (MAXIPIME) 2 g in sodium chloride 0.9 % 100 mL IVPB (0 g Intravenous Stopped 10/27/20 1216)  vancomycin (VANCOCIN) IVPB 1000 mg/200 mL premix (0 mg Intravenous Stopped 10/27/20 1354)  vancomycin (VANCOCIN) IVPB 1000 mg/200 mL premix (0 mg Intravenous Stopped 10/27/20 1459)  lactated ringers bolus 1,000 mL (0 mLs Intravenous Stopped 10/28/20 0243)    Mobility walks with device Low fall risk   Focused Assessments Cardiac Assessment Handoff:    No results found for: CKTOTAL, CKMB, CKMBINDEX, TROPONINI No results found for: DDIMER Does the Patient currently have chest pain? No      R Recommendations: See Admitting Provider Note  Report given to:   Additional Notes:

## 2020-10-28 NOTE — Progress Notes (Signed)
CRITICAL CARE PROGRESS NOTE    Name: Dakota Harper MRN: [id number] DOB: [birth year]     LOS: 1  Referring physician: Dr Francine Graven  SUBJECTIVE FINDINGS & SIGNIFICANT EVENTS    Patient description:    47 M hx of hypertension, neuropathy, BPH, COPD who was sent from the skilled nursing facility for evaluation of a fever and low blood pressure. Noted to be febrile at SNF. Presented with symptoms of LRTI including cough and dyspnea,chills. He denies having any nausea, no vomiting, no constipation, no diarrhea, no urinary frequency, no nocturia, no dysuria, no dizziness, no lightheadedness,  No headache, no chest pain, no abdominal pain.  CXR with R lung infiltrate suggestive of CAP. Patient received 3L LR in ER and then required vasopressor support so PCCM was consulted for additional evaluation and management.    10/28/20- patient is improved. He is with stable vitals , off all vasopressors.  He speaks in full sentences during interview without SOB.  Discussed his care with Dr Priscella Mann.  PCCM will sign off and are available if needed.   Lines/tubes :   Microbiology/Sepsis markers: Results for orders placed or performed during the hospital encounter of 10/27/20  Blood Culture (routine x 2)     Status: None (Preliminary result)   Collection Time: 10/27/20 11:10 AM   Specimen: BLOOD  Result Value Ref Range Status   Specimen Description BLOOD BLOOD RIGHT FOREARM  Final   Special Requests   Final    BOTTLES DRAWN AEROBIC AND ANAEROBIC Blood Culture adequate volume   Culture   Final    NO GROWTH < 24 HOURS Performed at [facility], [address], [place], [place] [address]    Report Status PENDING  Incomplete  Blood Culture (routine x 2)     Status: None (Preliminary result)   Collection Time: 10/27/20  11:10 AM   Specimen: BLOOD  Result Value Ref Range Status   Specimen Description BLOOD BLOOD RIGHT HAND  Final   Special Requests   Final    BOTTLES DRAWN AEROBIC AND ANAEROBIC Blood Culture adequate volume   Culture   Final    NO GROWTH < 24 HOURS Performed at [facility], [address], [place], [place] [address]    Report Status PENDING  Incomplete  Resp Panel by RT-PCR (Flu A&B, Covid) Nasopharyngeal Swab     Status: None   Collection Time: 10/27/20 11:10 AM   Specimen: Nasopharyngeal Swab; Nasopharyngeal(NP) swabs in vial transport medium  Result Value Ref Range Status   SARS Coronavirus 2 by RT PCR NEGATIVE NEGATIVE Final    Comment: (NOTE) SARS-CoV-2 target nucleic acids are NOT DETECTED.  The SARS-CoV-2 RNA is generally detectable in upper respiratory specimens during the acute phase of infection. The lowest concentration of SARS-CoV-2 viral copies this assay can detect is 138 copies/mL. A negative result does not preclude SARS-Cov-2 infection and should not be used as the sole basis for treatment or other patient management decisions. A negative result may occur with  improper specimen collection/handling, submission of specimen other than nasopharyngeal swab, presence of viral mutation(s) within the areas targeted by this assay, and inadequate number of viral copies(<138 copies/mL). A negative result must be combined with clinical observations, patient history, and epidemiological information. The expected result is Negative.  Fact Sheet for Patients:  EntrepreneurPulse.com.au  Fact Sheet for Healthcare Providers:  IncredibleEmployment.be  This test is no t yet approved or cleared by the [place] and  has been authorized  for detection and/or diagnosis of SARS-CoV-2 by FDA under an Emergency Use Authorization (EUA). This EUA will remain  in effect (meaning this test can be used) for the duration of the COVID-19  declaration under Section 564(b)(1) of the Act, 21 U.S.C.section 360bbb-3(b)(1), unless the authorization is terminated  or revoked sooner.       Influenza A by PCR NEGATIVE NEGATIVE Final   Influenza B by PCR NEGATIVE NEGATIVE Final    Comment: (NOTE) The Xpert Xpress SARS-CoV-2/FLU/RSV plus assay is intended as an aid in the diagnosis of influenza from Nasopharyngeal swab specimens and should not be used as a sole basis for treatment. Nasal washings and aspirates are unacceptable for Xpert Xpress SARS-CoV-2/FLU/RSV testing.  Fact Sheet for Patients: EntrepreneurPulse.com.au  Fact Sheet for Healthcare Providers: IncredibleEmployment.be  This test is not yet approved or cleared by the [place] FDA and has been authorized for detection and/or diagnosis of SARS-CoV-2 by FDA under an Emergency Use Authorization (EUA). This EUA will remain in effect (meaning this test can be used) for the duration of the COVID-19 declaration under Section 564(b)(1) of the Act, 21 U.S.C. section 360bbb-3(b)(1), unless the authorization is terminated or revoked.  Performed at [facility], [address], [place], [place] [address]     Anti-infectives:  Anti-infectives (From admission, onward)   Start     Dose/Rate Route Frequency Ordered Stop   10/27/20 2200  cefTRIAXone (ROCEPHIN) 2 g in sodium chloride 0.9 % 100 mL IVPB        2 g 200 mL/hr over 30 Minutes Intravenous Every 24 hours 10/27/20 1330     10/27/20 1400  azithromycin (ZITHROMAX) 500 mg in sodium chloride 0.9 % 250 mL IVPB        500 mg 250 mL/hr over 60 Minutes Intravenous Every 24 hours 10/27/20 1330     10/27/20 1230  vancomycin (VANCOCIN) IVPB 1000 mg/200 mL premix        1,000 mg 200 mL/hr over 60 Minutes Intravenous  Once 10/27/20 1208 10/27/20 1459   10/27/20 1115  ceFEPIme (MAXIPIME) 2 g in sodium chloride 0.9 % 100 mL IVPB        2 g 200 mL/hr over 30 Minutes Intravenous   Once 10/27/20 1110 10/27/20 1216   10/27/20 1115  vancomycin (VANCOCIN) IVPB 1000 mg/200 mL premix        1,000 mg 200 mL/hr over 60 Minutes Intravenous  Once 10/27/20 1110 10/27/20 1354        PAST MEDICAL HISTORY   Past Medical History:  Diagnosis Date  . Asthma   . BPH (benign prostatic hyperplasia)   . Ear pain   . Hypercholesteremia   . Hypertension   . Neuropathy   . Sleep disorder      SURGICAL HISTORY   Past Surgical History:  Procedure Laterality Date  . CHOLECYSTECTOMY    . HERNIA REPAIR       FAMILY HISTORY   Family History  Problem Relation Age of Onset  . Prostate cancer Neg Hx   . Bladder Cancer Neg Hx   . Kidney cancer Neg Hx      SOCIAL HISTORY   Social History   Tobacco Use  . Smoking status: Never Smoker  . Smokeless tobacco: Never Used  Vaping Use  . Vaping Use: Never used  Substance Use Topics  . Alcohol use: No  . Drug use: No     MEDICATIONS   Current Medication:  Current Facility-Administered Medications:  .  0.9 %  sodium chloride infusion, , Intravenous, Continuous, Sreenath, Sudheer B, MD, Last Rate: 100 mL/hr at 10/28/20 0940, Rate Change at 10/28/20 0940 .  acetaminophen (TYLENOL) tablet 650 mg, 650 mg, Oral, Q4H PRN, Agbata, Tochukwu, MD .  albuterol (VENTOLIN HFA) 108 (90 Base) MCG/ACT inhaler 1-2 puff, 1-2 puff, Inhalation, Q4H PRN, Sreenath, Sudheer B, MD .  arformoterol (BROVANA) nebulizer solution 15 mcg, 15 mcg, Nebulization, BID, Sreenath, Sudheer B, MD, 15 mcg at 10/28/20 1131 .  aspirin EC tablet 81 mg, 81 mg, Oral, QHS, Agbata, Tochukwu, MD, 81 mg at 10/27/20 2314 .  atorvastatin (LIPITOR) tablet 10 mg, 10 mg, Oral, Daily, Agbata, Tochukwu, MD, 10 mg at 10/28/20 1055 .  azithromycin (ZITHROMAX) 500 mg in sodium chloride 0.9 % 250 mL IVPB, 500 mg, Intravenous, Q24H, Agbata, Tochukwu, MD, Last Rate: 250 mL/hr at 10/28/20 1316, 500 mg at 10/28/20 1316 .  budesonide (PULMICORT) nebulizer solution 0.25 mg, 0.25  mg, Nebulization, BID, Sreenath, Sudheer B, MD, 0.25 mg at 10/28/20 1101 .  cefTRIAXone (ROCEPHIN) 2 g in sodium chloride 0.9 % 100 mL IVPB, 2 g, Intravenous, Q24H, Agbata, Tochukwu, MD, Stopped at 10/28/20 0015 .  cycloSPORINE (RESTASIS) 0.05 % ophthalmic emulsion 1 drop, 1 drop, Both Eyes, BID PRN, Agbata, Tochukwu, MD .  enoxaparin (LOVENOX) injection 40 mg, 40 mg, Subcutaneous, Q24H, Agbata, Tochukwu, MD, 40 mg at 10/27/20 2313 .  famotidine (PEPCID) tablet 40 mg, 40 mg, Oral, QHS, Agbata, Tochukwu, MD, 40 mg at 10/27/20 2312 .  fenofibrate tablet 54 mg, 54 mg, Oral, Daily, Agbata, Tochukwu, MD, 54 mg at 10/28/20 1056 .  finasteride (PROSCAR) tablet 5 mg, 5 mg, Oral, Daily, Agbata, Tochukwu, MD, 5 mg at 10/28/20 1057 .  gabapentin (NEURONTIN) tablet 600 mg, 600 mg, Oral, QHS, Agbata, Tochukwu, MD, 600 mg at 10/27/20 2314 .  hydrocortisone sodium succinate (SOLU-CORTEF) 100 MG injection 50 mg, 50 mg, Intravenous, Q6H, Sreenath, Sudheer B, MD, 50 mg at 10/28/20 1312 .  ipratropium-albuterol (DUONEB) 0.5-2.5 (3) MG/3ML nebulizer solution 3 mL, 3 mL, Nebulization, Q6H, Sreenath, Sudheer B, MD, 3 mL at 10/28/20 1310 .  levothyroxine (SYNTHROID) tablet 50 mcg, 50 mcg, Oral, QAC breakfast, Agbata, Tochukwu, MD, 50 mcg at 10/28/20 0631 .  midodrine (PROAMATINE) tablet 10 mg, 10 mg, Oral, TID WC, Islay Polanco, MD, 10 mg at 10/28/20 1310 .  mirabegron ER (MYRBETRIQ) tablet 50 mg, 50 mg, Oral, Daily, Agbata, Tochukwu, MD, 50 mg at 10/28/20 1055 .  montelukast (SINGULAIR) tablet 5 mg, 5 mg, Oral, QHS, Agbata, Tochukwu, MD, 5 mg at 10/27/20 2324 .  multivitamin with minerals tablet 1 tablet, 1 tablet, Oral, Daily, Agbata, Tochukwu, MD, 1 tablet at 10/28/20 1056 .  nortriptyline (PAMELOR) capsule 150 mg, 150 mg, Oral, Daily, Agbata, Tochukwu, MD, 150 mg at 10/28/20 1057 .  ondansetron (ZOFRAN) tablet 4 mg, 4 mg, Oral, Q6H PRN **OR** ondansetron (ZOFRAN) injection 4 mg, 4 mg, Intravenous, Q6H PRN, Agbata,  Tochukwu, MD .  tamsulosin (FLOMAX) capsule 0.4 mg, 0.4 mg, Oral, QHS, Agbata, Tochukwu, MD, 0.4 mg at 10/27/20 2312 .  tiZANidine (ZANAFLEX) tablet 2 mg, 2 mg, Oral, Q6H PRN, Agbata, Tochukwu, MD  Current Outpatient Medications:  .  acetaminophen (TYLENOL) 325 MG tablet, Take 650 mg by mouth every 4 (four) hours as needed for mild pain., Disp: , Rfl:  .  acetaminophen (TYLENOL) 500 MG tablet, Take 500 mg by mouth every 4 (four) hours as needed for moderate pain or mild pain., Disp: , Rfl:  .  albuterol (PROVENTIL HFA;VENTOLIN HFA) 108 (90 BASE) MCG/ACT inhaler, Inhale 1 puff into the lungs 3 (three) times daily as needed for wheezing or shortness of breath., Disp: , Rfl:  .  alum & mag hydroxide-simeth (MAALOX/MYLANTA) 200-200-20 MG/5ML suspension, Take 30 mLs by mouth 4 (four) times daily as needed for indigestion or heartburn., Disp: , Rfl:  .  aspirin EC 81 MG tablet, Take 81 mg by mouth at bedtime. , Disp: , Rfl:  .  atorvastatin (LIPITOR) 10 MG tablet, Take 10 mg by mouth every evening., Disp: , Rfl:  .  barrier cream (NON-SPECIFIED) CREA, Apply 1 application topically as needed (post-toileting skin protection)., Disp: , Rfl:  .  carbamide peroxide (EAR WAX REMOVAL DROPS) 6.5 % OTIC solution, Place 5 drops into both ears every Sunday., Disp: , Rfl:  .  chlorhexidine (PERIDEX) 0.12 % solution, Use as directed 15 mLs in the mouth or throat 2 (two) times daily as needed (oral discomfort)., Disp: , Rfl:  .  cycloSPORINE (RESTASIS) 0.05 % ophthalmic emulsion, Place 1 drop into both eyes 2 (two) times daily as needed (dry eyes)., Disp: , Rfl:  .  diphenhydrAMINE (BENADRYL) 25 MG tablet, Take 25 mg by mouth every 6 (six) hours as needed for itching or allergies., Disp: , Rfl:  .  famotidine (PEPCID) 40 MG tablet, Take 40 mg by mouth every evening., Disp: , Rfl:  .  finasteride (PROSCAR) 5 MG tablet, Take 1 tablet (5 mg total) by mouth daily., Disp: 90 tablet, Rfl: 3 .  fluocinonide (LIDEX) 0.05 %  external solution, Apply 1 application topically 2 (two) times daily., Disp: , Rfl:  .  fluocinonide ointment (LIDEX) [id number] %, Apply 1 application topically 2 (two) times daily as needed (pruritus). (apply to legs), Disp: , Rfl:  .  fluticasone furoate-vilanterol (BREO ELLIPTA) 200-25 MCG/INH AEPB, Inhale 1 puff into the lungs daily., Disp: , Rfl:  .  gabapentin (NEURONTIN) 100 MG capsule, Take 200 mg by mouth at bedtime. (take with 600mg  tablet to equal 800mg  total), Disp: , Rfl:  .  gabapentin (NEURONTIN) 600 MG tablet, Take 600 mg by mouth at bedtime. (take with 100mg  capsules to equal 800mg  total), Disp: , Rfl:  .  guaiFENesin (ROBITUSSIN) 100 MG/5ML liquid, Take 300 mg by mouth every 6 (six) hours as needed for cough., Disp: , Rfl:  .  hydrocortisone 1 % ointment, Apply 1 application topically daily as needed for itching., Disp: , Rfl:  .  ibuprofen (ADVIL) 400 MG tablet, Take 400 mg by mouth 3 (three) times daily as needed for mild pain or moderate pain., Disp: , Rfl:  .  ipratropium-albuterol (DUONEB) 0.5-2.5 (3) MG/3ML SOLN, Take 3 mLs by nebulization 2 (two) times daily., Disp: , Rfl:  .  levothyroxine (SYNTHROID, LEVOTHROID) 50 MCG tablet, Take 50 mcg daily before breakfast by mouth., Disp: , Rfl:  .  loperamide (IMODIUM) 2 MG capsule, Take 4 mg by mouth as needed for diarrhea or loose stools ((max 8 doses in 24 hours))., Disp: , Rfl:  .  losartan (COZAAR) 25 MG tablet, Take 25 mg by mouth daily., Disp: , Rfl:  .  magnesium hydroxide (MILK OF MAGNESIA) 400 MG/5ML suspension, Take 30 mLs by mouth daily as needed for mild constipation or moderate constipation., Disp: , Rfl:  .  mirabegron ER (MYRBETRIQ) 50 MG TB24 tablet, Take 50 mg by mouth daily., Disp: , Rfl:  .  montelukast (SINGULAIR) 5 MG chewable tablet, Chew 5 mg by mouth at bedtime., Disp: ,  Rfl:  .  Multiple Vitamin (MULTI-VITAMINS) TABS, Take 1 tablet by mouth daily. , Disp: , Rfl:  .  nortriptyline (PAMELOR) 75 MG capsule, Take 150  mg by mouth daily. , Disp: , Rfl:  .  omeprazole (PRILOSEC) 10 MG capsule, Take 10 mg by mouth daily., Disp: , Rfl:  .  polyethylene glycol (MIRALAX / GLYCOLAX) 17 g packet, Take 17 g by mouth daily as needed for mild constipation or moderate constipation., Disp: , Rfl:  .  tamsulosin (FLOMAX) 0.4 MG CAPS capsule, Take 0.4 mg by mouth at bedtime., Disp: , Rfl:  .  tiZANidine (ZANAFLEX) 2 MG tablet, Take 2 mg by mouth at bedtime., Disp: , Rfl:     ALLERGIES   Saw palmetto, Other, Sulfa antibiotics, and Sulfacetamide sodium    REVIEW OF SYSTEMS    10 point ROS done and is negative except as per subjective findings.   PHYSICAL EXAMINATION   Vital Signs: Temp:  [98.2 F (36.8 C)] 98.2 F (36.8 C) (12/23 0315) Pulse Rate:  [59-104] 93 (12/23 1145) Resp:  [13-42] 13 (12/23 1145) BP: (74-140)/(40-103) 117/61 (12/23 1100) SpO2:  [84 %-100 %] 98 % (12/23 1145)  GENERAL:age appropriate HEAD: Normocephalic, atraumatic.  EYES: Pupils equal, round, reactive to light.  No scleral icterus.  MOUTH: Moist mucosal membrane. NECK: Supple. No thyromegaly. No nodules. No JVD.  PULMONARY: mild crackles CARDIOVASCULAR: S1 and S2. Regular rate and rhythm. No murmurs, rubs, or gallops.  GASTROINTESTINAL: Soft, nontender, non-distended. No masses. Positive bowel sounds. No hepatosplenomegaly.  MUSCULOSKELETAL: No swelling, clubbing, or edema.  NEUROLOGIC: Mild distress due to acute illness SKIN:intact,warm,dry   PERTINENT DATA     Infusions: . sodium chloride 100 mL/hr at 10/28/20 0940  . azithromycin 500 mg (10/28/20 1316)  . cefTRIAXone (ROCEPHIN)  IV Stopped (10/28/20 0015)   Scheduled Medications: . arformoterol  15 mcg Nebulization BID  . aspirin EC  81 mg Oral QHS  . atorvastatin  10 mg Oral Daily  . budesonide (PULMICORT) nebulizer solution  0.25 mg Nebulization BID  . enoxaparin (LOVENOX) injection  40 mg Subcutaneous Q24H  . famotidine  40 mg Oral QHS  . fenofibrate  54 mg  Oral Daily  . finasteride  5 mg Oral Daily  . gabapentin  600 mg Oral QHS  . hydrocortisone sod succinate (SOLU-CORTEF) inj  50 mg Intravenous Q6H  . ipratropium-albuterol  3 mL Nebulization Q6H  . levothyroxine  50 mcg Oral QAC breakfast  . midodrine  10 mg Oral TID WC  . mirabegron ER  50 mg Oral Daily  . montelukast  5 mg Oral QHS  . multivitamin with minerals  1 tablet Oral Daily  . nortriptyline  150 mg Oral Daily  . tamsulosin  0.4 mg Oral QHS   PRN Medications: acetaminophen, albuterol, cycloSPORINE, ondansetron **OR** ondansetron (ZOFRAN) IV, tiZANidine Hemodynamic parameters:   Intake/Output: 12/22 0701 - 12/23 0700 In: 4200 [IV Piggyback:4200] Out: 600 [Urine:600]  Ventilator  Settings:      LAB RESULTS:  Basic Metabolic Panel: Recent Labs  Lab 10/27/20 1110 10/28/20 0336  NA 137 138  K 4.3 4.2  CL 104 104  CO2 20* 25  GLUCOSE 126* 90  BUN 26* 23  CREATININE 2.39* 1.60*  CALCIUM 8.6* 8.5*   Liver Function Tests: Recent Labs  Lab 10/27/20 1110  AST 28  ALT 19  ALKPHOS 78  BILITOT 1.8*  PROT 6.2*  ALBUMIN 2.9*   No results for input(s): LIPASE, AMYLASE in the last 168  hours. No results for input(s): AMMONIA in the last 168 hours. CBC: Recent Labs  Lab 10/27/20 1110 10/28/20 0336  WBC 26.5* 20.0*  NEUTROABS 23.8*  --   HGB 13.6 13.5  HCT 39.6 39.2  MCV 91.2 91.8  PLT 227 200   Cardiac Enzymes: No results for input(s): CKTOTAL, CKMB, CKMBINDEX, TROPONINI in the last 168 hours. BNP: Invalid input(s): POCBNP CBG: No results for input(s): GLUCAP in the last 168 hours.     IMAGING RESULTS:  Imaging: DG Chest Port 1 View  Result Date: 10/27/2020 CLINICAL DATA:  Fever EXAM: PORTABLE CHEST 1 VIEW COMPARISON:  11/25/2019 FINDINGS: Heart size within normal limits. Low lung volumes. Atherosclerotic calcification of the aortic knob. Patchy airspace opacity within the right upper lobe. Bibasilar interstitial prominence. No large pleural  fluid collection. No pneumothorax. IMPRESSION: Patchy airspace opacity within the right upper lobe, suspicious for pneumonia. Electronically Signed   By: Davina Poke D.O.   On: 10/27/2020 11:31   @PROBHOSP @ No results found.     ASSESSMENT AND PLAN    -Multidisciplinary rounds held today   Septic shock-resolving     - present on admission    - due to right lung community acquired pneumonia -RVP -resp culture -procalcitonin -empiric abx -use vasopressors to keep MAP>65- currently midodrine po only  -follow ABG and LA -follow up cultures -emperic ABX -consider stress dose steroids   ID -continue IV abx as prescibed -follow up cultures  GI/Nutrition GI PROPHYLAXIS as indicated DIET-->TF's as tolerated Constipation protocol as indicated  ENDO - ICU hypoglycemic\Hyperglycemia protocol -check FSBS per protocol   ELECTROLYTES -follow labs as needed -replace as needed -pharmacy consultation   DVT/GI PRX ordered -SCDs  TRANSFUSIONS AS NEEDED MONITOR FSBS ASSESS the need for LABS as needed    This document was prepared using Dragon voice recognition software and may include unintentional dictation errors.    Ottie Glazier, M.D.  Division of [place]

## 2020-10-28 NOTE — ED Notes (Signed)
Patient eating breakfast tray at this time. 

## 2020-10-28 NOTE — ED Notes (Signed)
Pt to toilet via wheelchair. Able to move between wheelchair and bed without assist

## 2020-10-28 NOTE — ED Notes (Signed)
Patient calling out for pump beeping. Pump fixed by this RN

## 2020-10-28 NOTE — Progress Notes (Signed)
SLP Cancellation Note  Patient Details Name: BERTIS HUSTEAD MRN: [id number] DOB: Mar 01, 1946   Cancelled treatment:       Reason Eval/Treat Not Completed: SLP screened, no needs identified, will sign off   Chart review, pt screened on current diet with no acute deficits observed.   Ashauna Bertholf B. Rutherford Nail M.S., CCC-SLP, [place] Office [phone]    Stormy Fabian 10/28/2020, 12:17 PM

## 2020-10-28 NOTE — ED Notes (Signed)
Patient requested more breakfast. This RN called and ordered french toast sticks and apple juice for breakfast. Fruit salad, hamburger and apple juice ordered for lunch

## 2020-10-28 NOTE — Progress Notes (Signed)
PROGRESS NOTE    Dakota Harper  OEC:[id number] DOB: 10/05/1946 DOA: 10/27/2020 PCP: [facility]  Brief Narrative:  74 y.o. male with medical history significant for hypertension, neuropathy, BPH, COPD who was sent from the skilled nursing facility for evaluation of a fever and low blood pressure.  Patient was said to have a temp of 100.8 F at the skilled nursing facility for which he was treated with Tylenol.  They also documented a low blood pressure.  12/23: Patient was initially very hypotensive requiring multiple fluid boluses and initiation of p.o. vasopressor with midodrine 10 mg 3 times daily. This morning his blood pressure responded appropriately and he was hemodynamically stable. He is in no visible distress.   Assessment & Plan:   Principal Problem:   Severe sepsis (HCC) Active Problems:   Hypothyroidism   BPH (benign prostatic hyperplasia)   Pneumonia   AKI (acute kidney injury) (HCC)   COPD with chronic bronchitis (HCC)   Septic shock (HCC)   Sepsis due to pneumonia (HCC)  Septic shock, resolved Severe sepsis secondary to pneumonia -As evidenced by fever with a T-max of 100.8 F, tachypnea with respiratory rate of 24, hypotension requiring IV fluid resuscitation, marked leukocytosis of 26,000 with a left shift and elevated lactic acid of 3.7 -Also had an acute kidney injury -Source of sepsis appears to be right upper lobe pneumonia rule out aspiration pneumonia -Patient received sepsis fluids per protocol in the ER with improvement in his blood pressure -Shock physiology resolved -No indication for ICU level of care at this time Plan: Continue IV fluids Continue midodrine Start stress dose steroids Continue CAP coverage with Rocephin and azithromycin Follow cultures Monitor fever curve  Acute kidney injury Most likely secondary to ATN from hypotension At baseline patient has a serum creatinine of 1.19 and today on admission it is 2.39 -Improving  over interval Plan: Hold losartan Continue IV fluid hydration Repeat renal parameters in a.m.  BPH Continue Flomax  Hypothyroidism Continue Synthroid   COPD Not acutely exacerbated Nebulizer regimen ordered Stress dose steroids as above   DVT prophylaxis: Lovenox Code Status: Full Family Communication: None today  disposition Plan: Status is: Inpatient  Remains inpatient appropriate because:Inpatient level of care appropriate due to severity of illness   Dispo: The patient is from: ALF              Anticipated d/c is to: ALF              Anticipated d/c date is: 2 days              Patient currently is not medically stable to d/c.  Resolving sepsis secondary to pneumonia       Consultants:   None  Procedures:   None  Antimicrobials:   Ceftriaxone  Azithromycin   Subjective: Patient seen and examined. Stable, no distress. No pain complaints. Does endorse some shortness of breath.  Objective: Vitals:   10/28/20 0700 10/28/20 0900 10/28/20 1100 10/28/20 1145  BP: (!) 117/58 117/61 117/61   Pulse: 88 90 96 93  Resp: 16 17 (!) 37 13  Temp:      TempSrc:      SpO2: 99% 91% 95% 98%  Weight:      Height:        Intake/Output Summary (Last 24 hours) at 10/28/2020 1321 Last data filed at 10/28/2020 0631 Gross per 24 hour  Intake 3200 ml  Output 600 ml  Net 2600 ml  Filed Weights   10/27/20 1103  Weight: 99.8 kg    Examination:  General exam: Appears calm and comfortable  Respiratory system: Scattered end expiratory wheeze. Normal work of breathing. Room air Cardiovascular system: S1 & S2 heard, RRR. No JVD, murmurs, rubs, gallops or clicks. No pedal edema. Gastrointestinal system: Abdomen is nondistended, soft and nontender. No organomegaly or masses felt. Normal bowel sounds heard. Central nervous system: Alert and oriented. No focal neurological deficits. Extremities: Symmetric 5 x 5 power. Skin: No rashes, lesions or  ulcers Psychiatry: Judgement and insight appear normal. Mood & affect appropriate.     Data Reviewed: I have personally reviewed following labs and imaging studies  CBC: Recent Labs  Lab 10/27/20 1110 10/28/20 0336  WBC 26.5* 20.0*  NEUTROABS 23.8*  --   HGB 13.6 13.5  HCT 39.6 39.2  MCV 91.2 91.8  PLT 227 144   Basic Metabolic Panel: Recent Labs  Lab 10/27/20 1110 10/28/20 0336  NA 137 138  K 4.3 4.2  CL 104 104  CO2 20* 25  GLUCOSE 126* 90  BUN 26* 23  CREATININE 2.39* 1.60*  CALCIUM 8.6* 8.5*   GFR: Estimated Creatinine Clearance: 46.4 mL/min (A) (by C-G formula based on SCr of 1.6 mg/dL (H)). Liver Function Tests: Recent Labs  Lab 10/27/20 1110  AST 28  ALT 19  ALKPHOS 78  BILITOT 1.8*  PROT 6.2*  ALBUMIN 2.9*   No results for input(s): LIPASE, AMYLASE in the last 168 hours. No results for input(s): AMMONIA in the last 168 hours. Coagulation Profile: Recent Labs  Lab 10/27/20 1110 10/28/20 0336  INR 1.1 1.2   Cardiac Enzymes: No results for input(s): CKTOTAL, CKMB, CKMBINDEX, TROPONINI in the last 168 hours. BNP (last 3 results) No results for input(s): PROBNP in the last 8760 hours. HbA1C: No results for input(s): HGBA1C in the last 72 hours. CBG: No results for input(s): GLUCAP in the last 168 hours. Lipid Profile: No results for input(s): CHOL, HDL, LDLCALC, TRIG, CHOLHDL, LDLDIRECT in the last 72 hours. Thyroid Function Tests: No results for input(s): TSH, T4TOTAL, FREET4, T3FREE, THYROIDAB in the last 72 hours. Anemia Panel: No results for input(s): VITAMINB12, FOLATE, FERRITIN, TIBC, IRON, RETICCTPCT in the last 72 hours. Sepsis Labs: Recent Labs  Lab 10/27/20 1110 10/27/20 1355 10/27/20 1832 10/28/20 0336  PROCALCITON  --   --  8.10 7.30  LATICACIDVEN 3.7* 3.1* 1.5 1.5    Recent Results (from the past 240 hour(s))  Blood Culture (routine x 2)     Status: None (Preliminary result)   Collection Time: 10/27/20 11:10 AM    Specimen: BLOOD  Result Value Ref Range Status   Specimen Description BLOOD BLOOD RIGHT FOREARM  Final   Special Requests   Final    BOTTLES DRAWN AEROBIC AND ANAEROBIC Blood Culture adequate volume   Culture   Final    NO GROWTH < 24 HOURS Performed at [facility], [address], [place], [place] [address]    Report Status PENDING  Incomplete  Blood Culture (routine x 2)     Status: None (Preliminary result)   Collection Time: 10/27/20 11:10 AM   Specimen: BLOOD  Result Value Ref Range Status   Specimen Description BLOOD BLOOD RIGHT HAND  Final   Special Requests   Final    BOTTLES DRAWN AEROBIC AND ANAEROBIC Blood Culture adequate volume   Culture   Final    NO GROWTH < 24 HOURS Performed at [facility], [address] [place], [place] [address]    Report Status PENDING  Incomplete  Resp Panel by RT-PCR (Flu A&B, Covid) Nasopharyngeal Swab     Status: None   Collection Time: 10/27/20 11:10 AM   Specimen: Nasopharyngeal Swab; Nasopharyngeal(NP) swabs in vial transport medium  Result Value Ref Range Status   SARS Coronavirus 2 by RT PCR NEGATIVE NEGATIVE Final    Comment: (NOTE) SARS-CoV-2 target nucleic acids are NOT DETECTED.  The SARS-CoV-2 RNA is generally detectable in upper respiratory specimens during the acute phase of infection. The lowest concentration of SARS-CoV-2 viral copies this assay can detect is 138 copies/mL. A negative result does not preclude SARS-Cov-2 infection and should not be used as the sole basis for treatment or other patient management decisions. A negative result may occur with  improper specimen collection/handling, submission of specimen other than nasopharyngeal swab, presence of viral mutation(s) within the areas targeted by this assay, and inadequate number of viral copies(<138 copies/mL). A negative result must be combined with clinical observations, patient history, and epidemiological information. The expected  result is Negative.  Fact Sheet for Patients:  EntrepreneurPulse.com.au  Fact Sheet for Healthcare Providers:  IncredibleEmployment.be  This test is no t yet approved or cleared by the [place] FDA and  has been authorized for detection and/or diagnosis of SARS-CoV-2 by FDA under an Emergency Use Authorization (EUA). This EUA will remain  in effect (meaning this test can be used) for the duration of the COVID-19 declaration under Section 564(b)(1) of the Act, 21 U.S.C.section 360bbb-3(b)(1), unless the authorization is terminated  or revoked sooner.       Influenza A by PCR NEGATIVE NEGATIVE Final   Influenza B by PCR NEGATIVE NEGATIVE Final    Comment: (NOTE) The Xpert Xpress SARS-CoV-2/FLU/RSV plus assay is intended as an aid in the diagnosis of influenza from Nasopharyngeal swab specimens and should not be used as a sole basis for treatment. Nasal washings and aspirates are unacceptable for Xpert Xpress SARS-CoV-2/FLU/RSV testing.  Fact Sheet for Patients: EntrepreneurPulse.com.au  Fact Sheet for Healthcare Providers: IncredibleEmployment.be  This test is not yet approved or cleared by the [place] FDA and has been authorized for detection and/or diagnosis of SARS-CoV-2 by FDA under an Emergency Use Authorization (EUA). This EUA will remain in effect (meaning this test can be used) for the duration of the COVID-19 declaration under Section 564(b)(1) of the Act, 21 U.S.C. section 360bbb-3(b)(1), unless the authorization is terminated or revoked.  Performed at [facility], [address], [place], [place] [address]          Radiology Studies: DG Chest [place] 1 View  Result Date: 10/27/2020 CLINICAL DATA:  Fever EXAM: PORTABLE CHEST 1 VIEW COMPARISON:  11/25/2019 FINDINGS: Heart size within normal limits. Low lung volumes. Atherosclerotic calcification of the aortic knob.  Patchy airspace opacity within the right upper lobe. Bibasilar interstitial prominence. No large pleural fluid collection. No pneumothorax. IMPRESSION: Patchy airspace opacity within the right upper lobe, suspicious for pneumonia. Electronically Signed   By: Davina Poke D.O.   On: 10/27/2020 11:31        Scheduled Meds: . arformoterol  15 mcg Nebulization BID  . aspirin EC  81 mg Oral QHS  . atorvastatin  10 mg Oral Daily  . budesonide (PULMICORT) nebulizer solution  0.25 mg Nebulization BID  . enoxaparin (LOVENOX) injection  40 mg Subcutaneous Q24H  . famotidine  40 mg Oral QHS  . fenofibrate  54 mg Oral Daily  .  finasteride  5 mg Oral Daily  . gabapentin  600 mg Oral QHS  . hydrocortisone sod succinate (SOLU-CORTEF) inj  50 mg Intravenous Q6H  . ipratropium-albuterol  3 mL Nebulization Q6H  . levothyroxine  50 mcg Oral QAC breakfast  . midodrine  10 mg Oral TID WC  . mirabegron ER  50 mg Oral Daily  . montelukast  5 mg Oral QHS  . multivitamin with minerals  1 tablet Oral Daily  . nortriptyline  150 mg Oral Daily  . tamsulosin  0.4 mg Oral QHS   Continuous Infusions: . sodium chloride 100 mL/hr at 10/28/20 0940  . azithromycin 500 mg (10/28/20 1316)  . cefTRIAXone (ROCEPHIN)  IV Stopped (10/28/20 0015)     LOS: 1 day    Time spent: 35 minutes    Sidney Ace, MD [facility] Pager [phone]  If 7PM-7AM, please contact night-coverage 10/28/2020, 1:21 PM

## 2020-10-29 DIAGNOSIS — R6520 Severe sepsis without septic shock: Secondary | ICD-10-CM | POA: Diagnosis not present

## 2020-10-29 DIAGNOSIS — A419 Sepsis, unspecified organism: Secondary | ICD-10-CM | POA: Diagnosis not present

## 2020-10-29 LAB — URINALYSIS, COMPLETE (UACMP) WITH MICROSCOPIC
Bilirubin Urine: NEGATIVE
Glucose, UA: 150 mg/dL — AB
Hgb urine dipstick: NEGATIVE
Ketones, ur: NEGATIVE mg/dL
Leukocytes,Ua: NEGATIVE
Nitrite: NEGATIVE
Protein, ur: NEGATIVE mg/dL
Specific Gravity, Urine: 1.026 (ref 1.005–1.030)
Squamous Epithelial / HPF: NONE SEEN (ref 0–5)
pH: 5 (ref 5.0–8.0)

## 2020-10-29 LAB — URINE CULTURE: Culture: <10000 — AB

## 2020-10-29 MED ORDER — GUAIFENESIN-DM 100-10 MG/5ML PO SYRP
5.0000 mL | ORAL_SOLUTION | ORAL | Status: DC | PRN
  Administered 2020-10-29 – 2020-10-31 (×4): 5 mL via ORAL
  Filled 2020-10-29 (×4): qty 5

## 2020-10-29 MED ORDER — SODIUM CHLORIDE 0.9 % IV SOLN
INTRAVENOUS | Status: DC | PRN
  Administered 2020-10-29 – 2020-10-31 (×2): 500 mL via INTRAVENOUS

## 2020-10-29 MED ORDER — GUAIFENESIN ER 600 MG PO TB12
600.0000 mg | ORAL_TABLET | Freq: Two times a day (BID) | ORAL | Status: DC
  Administered 2020-10-29 – 2020-11-01 (×7): 600 mg via ORAL
  Filled 2020-10-29 (×7): qty 1

## 2020-10-29 MED ORDER — BENZONATATE 100 MG PO CAPS
200.0000 mg | ORAL_CAPSULE | Freq: Three times a day (TID) | ORAL | Status: DC
Start: 2020-10-29 — End: 2020-10-29

## 2020-10-29 MED ORDER — PANTOPRAZOLE SODIUM 40 MG PO TBEC
40.0000 mg | DELAYED_RELEASE_TABLET | Freq: Every day | ORAL | Status: DC
  Administered 2020-10-29 – 2020-11-01 (×4): 40 mg via ORAL
  Filled 2020-10-29 (×4): qty 1

## 2020-10-29 MED ORDER — MIDODRINE HCL 5 MG PO TABS
5.0000 mg | ORAL_TABLET | Freq: Three times a day (TID) | ORAL | Status: DC
  Filled 2020-10-29 (×2): qty 1

## 2020-10-29 NOTE — Progress Notes (Signed)
OT Cancellation Note  Patient Details Name: Dakota Harper MRN: [id number] DOB: [birth year]   Cancelled Treatment:    Reason Eval/Treat Not Completed: OT screened, no needs identified, will sign off. Order received, chart reviewed. Per conversation c PT and pt, he is back to baseline functional independence. No skilled OT needs identified. Will sign off. Please re-consult if additional needs arise.   Dessie Coma, M.S. OTR/L  10/29/20, 2:27 PM  ascom [phone]

## 2020-10-29 NOTE — Progress Notes (Signed)
Tessalon was ordered by the MD this am. When I offered it to the patient he said that tessalon did not help him. Order received from Dr Priscella Mann to discontinue telemetry and tessalon

## 2020-10-29 NOTE — Progress Notes (Signed)
PROGRESS NOTE    Dakota Harper  GYJ:[id number] DOB: Feb 13, 1946 DOA: 10/27/2020 PCP: [facility]  Brief Narrative:  74 y.o. male with medical history significant for hypertension, neuropathy, BPH, COPD who was sent from the skilled nursing facility for evaluation of a fever and low blood pressure.  Patient was said to have a temp of 100.8 F at the skilled nursing facility for which he was treated with Tylenol.  They also documented a low blood pressure.  12/23: Patient was initially very hypotensive requiring multiple fluid boluses and initiation of p.o. vasopressor with midodrine 10 mg 3 times daily. This morning his blood pressure responded appropriately and he was hemodynamically stable. He is in no visible distress.  12/24: Blood pressure much improved.  Patient hemodynamically stable.  Afebrile   Assessment & Plan:   Principal Problem:   Severe sepsis ([place]) Active Problems:   Hypothyroidism   BPH (benign prostatic hyperplasia)   Pneumonia   AKI (acute kidney injury) ([place])   COPD with chronic bronchitis (HCC)   Septic shock (HCC)   Sepsis due to pneumonia (HCC)  Septic shock, resolved Severe sepsis secondary to pneumonia -As evidenced by fever with a T-max of 100.8 F, tachypnea with respiratory rate of 24, hypotension requiring IV fluid resuscitation, marked leukocytosis of 26,000 with a left shift and elevated lactic acid of 3.7 -Also had an acute kidney injury -Source of sepsis appears to be right upper lobe pneumonia rule out aspiration pneumonia -Patient received sepsis fluids per protocol in the ER with improvement in his blood pressure -Shock physiology resolved Plan: DC IV fluids DC midodrine Continue hydrocortisone 50 mg every 6 hours Continue CAP coverage with Rocephin and azithromycin Follow cultures, no growth to date Monitor fever curve, no fevers over interval  Acute kidney injury Most likely secondary to ATN from hypotension At baseline  patient has a serum creatinine of 1.19 and today on admission it is 2.39 Creatinine continues to improve.  Suspect prerenal azotemia versus ATN in setting of sepsis Plan: Continue to hold angiotensin receptor blocker DC IV fluids Encourage p.o. fluid intake Repeat renal parameters in a.m.  BPH Continue Flomax  Hypothyroidism Continue Synthroid   COPD Not acutely exacerbated Nebulizer regimen ordered Stress dose steroids as above   DVT prophylaxis: Lovenox Code Status: Full Family Communication: None today  disposition Plan: Status is: Inpatient  Remains inpatient appropriate because:Inpatient level of care appropriate due to severity of illness   Dispo: The patient is from: ALF              Anticipated d/c is to: ALF              Anticipated d/c date is: 2 days              Patient currently is not medically stable to d/c.  Resolving sepsis secondary to pneumonia.  Anticipate 24-48 hours prior to disposition.  Antcipate discharge back to ALF.       Consultants:   None  Procedures:   None  Antimicrobials:   Ceftriaxone  Azithromycin   Subjective: Patient seen and examined.  Patient stable, no distress.  No pain complaints.  Endorses cough  Objective: Vitals:   10/29/20 0423 10/29/20 0803 10/29/20 0914 10/29/20 1141  BP: 134/60  (!) 153/78 116/79  Pulse: 86  92 95  Resp: 20  20 18   Temp: 97.7 F (36.5 C)  (!) 97.5 F (36.4 C) 97.8 F (36.6 C)  TempSrc: Oral  Oral Oral  SpO2: 91% 92% 99% 97%  Weight:      Height:        Intake/Output Summary (Last 24 hours) at 10/29/2020 1152 Last data filed at 10/29/2020 [id number] Gross per 24 hour  Intake 1829.81 ml  Output 1075 ml  Net 754.81 ml   Filed Weights   10/27/20 1103  Weight: 99.8 kg    Examination:  General exam: Appears calm and comfortable  Respiratory system: Scattered end expiratory wheeze. Normal work of breathing. Room air Cardiovascular system: S1 & S2 heard, RRR. No JVD,  murmurs, rubs, gallops or clicks. No pedal edema. Gastrointestinal system: Abdomen is nondistended, soft and nontender. No organomegaly or masses felt. Normal bowel sounds heard. Central nervous system: Alert and oriented. No focal neurological deficits. Extremities: Symmetric 5 x 5 power. Skin: No rashes, lesions or ulcers Psychiatry: Judgement and insight appear normal. Mood & affect appropriate.     Data Reviewed: I have personally reviewed following labs and imaging studies  CBC: Recent Labs  Lab 10/27/20 1110 10/28/20 0336  WBC 26.5* 20.0*  NEUTROABS 23.8*  --   HGB 13.6 13.5  HCT 39.6 39.2  MCV 91.2 91.8  PLT 227 [id number]   Basic Metabolic Panel: Recent Labs  Lab 10/27/20 1110 10/28/20 0336  NA 137 138  K 4.3 4.2  CL 104 104  CO2 20* 25  GLUCOSE 126* 90  BUN 26* 23  CREATININE 2.39* 1.60*  CALCIUM 8.6* 8.5*   GFR: Estimated Creatinine Clearance: 46.4 mL/min (A) (by C-G formula based on SCr of 1.6 mg/dL (H)). Liver Function Tests: Recent Labs  Lab 10/27/20 1110  AST 28  ALT 19  ALKPHOS 78  BILITOT 1.8*  PROT 6.2*  ALBUMIN 2.9*   No results for input(s): LIPASE, AMYLASE in the last 168 hours. No results for input(s): AMMONIA in the last 168 hours. Coagulation Profile: Recent Labs  Lab 10/27/20 1110 10/28/20 0336  INR 1.1 1.2   Cardiac Enzymes: No results for input(s): CKTOTAL, CKMB, CKMBINDEX, TROPONINI in the last 168 hours. BNP (last 3 results) No results for input(s): PROBNP in the last 8760 hours. HbA1C: No results for input(s): HGBA1C in the last 72 hours. CBG: No results for input(s): GLUCAP in the last 168 hours. Lipid Profile: No results for input(s): CHOL, HDL, LDLCALC, TRIG, CHOLHDL, LDLDIRECT in the last 72 hours. Thyroid Function Tests: No results for input(s): TSH, T4TOTAL, FREET4, T3FREE, THYROIDAB in the last 72 hours. Anemia Panel: No results for input(s): VITAMINB12, FOLATE, FERRITIN, TIBC, IRON, RETICCTPCT in the last 72  hours. Sepsis Labs: Recent Labs  Lab 10/27/20 1110 10/27/20 1355 10/27/20 1832 10/28/20 0336  PROCALCITON  --   --  8.10 7.30  LATICACIDVEN 3.7* 3.1* 1.5 1.5    Recent Results (from the past 240 hour(s))  Blood Culture (routine x 2)     Status: None (Preliminary result)   Collection Time: 10/27/20 11:10 AM   Specimen: BLOOD  Result Value Ref Range Status   Specimen Description BLOOD BLOOD RIGHT FOREARM  Final   Special Requests   Final    BOTTLES DRAWN AEROBIC AND ANAEROBIC Blood Culture adequate volume   Culture   Final    NO GROWTH 2 DAYS Performed at [facility], [address], [place], [place] [address]    Report Status PENDING  Incomplete  Blood Culture (routine x 2)     Status: None (Preliminary result)   Collection Time: 10/27/20 11:10 AM   Specimen: BLOOD  Result Value Ref Range  Status   Specimen Description BLOOD BLOOD RIGHT HAND  Final   Special Requests   Final    BOTTLES DRAWN AEROBIC AND ANAEROBIC Blood Culture adequate volume   Culture   Final    NO GROWTH 2 DAYS Performed at [facility], [address] [place], [place] [address]    Report Status PENDING  Incomplete  Resp Panel by RT-PCR (Flu A&B, Covid) Nasopharyngeal Swab     Status: None   Collection Time: 10/27/20 11:10 AM   Specimen: Nasopharyngeal Swab; Nasopharyngeal(NP) swabs in vial transport medium  Result Value Ref Range Status   SARS Coronavirus 2 by RT PCR NEGATIVE NEGATIVE Final    Comment: (NOTE) SARS-CoV-2 target nucleic acids are NOT DETECTED.  The SARS-CoV-2 RNA is generally detectable in upper respiratory specimens during the acute phase of infection. The lowest concentration of SARS-CoV-2 viral copies this assay can detect is 138 copies/mL. A negative result does not preclude SARS-Cov-2 infection and should not be used as the sole basis for treatment or other patient management decisions. A negative result may occur with  improper specimen  collection/handling, submission of specimen other than nasopharyngeal swab, presence of viral mutation(s) within the areas targeted by this assay, and inadequate number of viral copies(<138 copies/mL). A negative result must be combined with clinical observations, patient history, and epidemiological information. The expected result is Negative.  Fact Sheet for Patients:  EntrepreneurPulse.com.au  Fact Sheet for Healthcare Providers:  IncredibleEmployment.be  This test is no t yet approved or cleared by the [place] FDA and  has been authorized for detection and/or diagnosis of SARS-CoV-2 by FDA under an Emergency Use Authorization (EUA). This EUA will remain  in effect (meaning this test can be used) for the duration of the COVID-19 declaration under Section 564(b)(1) of the Act, 21 U.S.C.section 360bbb-3(b)(1), unless the authorization is terminated  or revoked sooner.       Influenza A by PCR NEGATIVE NEGATIVE Final   Influenza B by PCR NEGATIVE NEGATIVE Final    Comment: (NOTE) The Xpert Xpress SARS-CoV-2/FLU/RSV plus assay is intended as an aid in the diagnosis of influenza from Nasopharyngeal swab specimens and should not be used as a sole basis for treatment. Nasal washings and aspirates are unacceptable for Xpert Xpress SARS-CoV-2/FLU/RSV testing.  Fact Sheet for Patients: EntrepreneurPulse.com.au  Fact Sheet for Healthcare Providers: IncredibleEmployment.be  This test is not yet approved or cleared by the [place] FDA and has been authorized for detection and/or diagnosis of SARS-CoV-2 by FDA under an Emergency Use Authorization (EUA). This EUA will remain in effect (meaning this test can be used) for the duration of the COVID-19 declaration under Section 564(b)(1) of the Act, 21 U.S.C. section 360bbb-3(b)(1), unless the authorization is terminated or revoked.  Performed at [facility], [address] [place], [place] [address]   Urine culture     Status: Abnormal   Collection Time: 10/27/20  8:51 PM   Specimen: Urine, Random  Result Value Ref Range Status   Specimen Description   Final    URINE, RANDOM Performed at [facility], [address] [place], [place] [address]    Special Requests   Final    NONE Performed at [facility], [address] [place], [place] [address]    Culture (A)  Final    <10,000 COLONIES/mL INSIGNIFICANT GROWTH Performed at [facility], [place] [address], [place], [place] [address]    Report Status 10/29/2020 FINAL  Final  Radiology Studies: No results found.      Scheduled Meds: . arformoterol  15 mcg Nebulization BID  . aspirin EC  81 mg Oral QHS  . atorvastatin  10 mg Oral Daily  . budesonide (PULMICORT) nebulizer solution  0.25 mg Nebulization BID  . enoxaparin (LOVENOX) injection  40 mg Subcutaneous Q24H  . fenofibrate  54 mg Oral Daily  . finasteride  5 mg Oral Daily  . fluocinonide ointment   Topical BID  . gabapentin  600 mg Oral QHS  . guaiFENesin  600 mg Oral BID  . hydrocortisone sod succinate (SOLU-CORTEF) inj  50 mg Intravenous Q6H  . ipratropium-albuterol  3 mL Nebulization Q6H  . levothyroxine  50 mcg Oral QAC breakfast  . mirabegron ER  50 mg Oral Daily  . montelukast  5 mg Oral QHS  . multivitamin with minerals  1 tablet Oral Daily  . nortriptyline  150 mg Oral Daily  . pantoprazole  40 mg Oral Daily  . tamsulosin  0.4 mg Oral QHS   Continuous Infusions: . azithromycin Stopped (10/28/20 1416)  . cefTRIAXone (ROCEPHIN)  IV Stopped (10/28/20 2308)     LOS: 2 days    Time spent: 35 minutes    Sidney Ace, MD [facility] Pager [phone]  If 7PM-7AM, please contact night-coverage 10/29/2020, 11:52 AM

## 2020-10-29 NOTE — Evaluation (Signed)
Physical Therapy Evaluation Patient Details Name: Dakota Harper MRN: [id number] DOB: 11-Aug-1946 Today's Date: 10/29/2020   History of Present Illness  Pt is a 74 y.o. male with medical history significant for hypertension, neuropathy, BPH, COPD who was sent from the [facility] for evaluation of a fever and low blood pressure.  MD assessment includes Septic shock, severe sepsis secondary to pneumonia, and acute kidney injury.    Clinical Impression  Pt was pleasant and motivated to participate during the session.  Pt was Ind with all functional tasks including transfers from various height surfaces and amb 200+ feet without an AD.  Pt's SpO2 and HR were WNL after amb with no adverse symptoms noted during the session.  No skilled PT needs identified during the session.  Will complete PT orders at this time but will reassess pt pending a change in status upon receipt of new PT orders.      Follow Up Recommendations No PT follow up    Equipment Recommendations  None recommended by PT    Recommendations for Other Services       Precautions / Restrictions Precautions Precautions: Fall Restrictions Weight Bearing Restrictions: No      Mobility  Bed Mobility Overal bed mobility: Independent                  Transfers Overall transfer level: Independent               General transfer comment: Good eccentric and concentric control and stability from multiple height surfaces  Ambulation/Gait Ambulation/Gait assistance: Independent Gait Distance (Feet): 200 Feet Assistive device: None Gait Pattern/deviations: WFL(Within Functional Limits) Gait velocity: WFL   General Gait Details: Good control and stability during amb without an AD including during 180 deg turns and navigating tight spaces in the room  Stairs            Wheelchair Mobility    Modified Rankin (Stroke Patients Only)       Balance Overall balance assessment: No apparent balance deficits  (not formally assessed)                                           Pertinent Vitals/Pain Pain Assessment: No/denies pain    Home Living Family/patient expects to be discharged to:: Assisted living               Home Equipment: Walker - 4 wheels;Cane - single point;Wheelchair - manual      Prior Function Level of Independence: Independent with assistive device(s)         Comments: Pt Ind with amb community distances without an AD with occasional use of w/c handles to hold while walking if feeling tired; 2 falls in the last year, one associated with weakness associated with prior admission and one secondary to Covid infection.  Staff at ALF assist with meals, meds, and SBA with bathing.     Hand Dominance        Extremity/Trunk Assessment   Upper Extremity Assessment Upper Extremity Assessment: Overall WFL for tasks assessed    Lower Extremity Assessment Lower Extremity Assessment: Overall WFL for tasks assessed       Communication   Communication: No difficulties  Cognition Arousal/Alertness: Awake/alert Behavior During Therapy: WFL for tasks assessed/performed Overall Cognitive Status: Within Functional Limits for tasks assessed  General Comments      Exercises     Assessment/Plan    PT Assessment Patent does not need any further PT services  PT Problem List         PT Treatment Interventions      PT Goals (Current goals can be found in the Care Plan section)  Acute Rehab PT Goals PT Goal Formulation: All assessment and education complete, DC therapy    Frequency     Barriers to discharge        Co-evaluation               AM-PAC PT "6 Clicks" Mobility  Outcome Measure Help needed turning from your back to your side while in a flat bed without using bedrails?: None Help needed moving from lying on your back to sitting on the side of a flat bed without using  bedrails?: None Help needed moving to and from a bed to a chair (including a wheelchair)?: None Help needed standing up from a chair using your arms (e.g., wheelchair or bedside chair)?: None Help needed to walk in hospital room?: None Help needed climbing 3-5 steps with a railing? : None 6 Click Score: 24    End of Session Equipment Utilized During Treatment: Gait belt Activity Tolerance: Patient tolerated treatment well Patient left: in bed;with call bell/phone within reach;with bed alarm set Nurse Communication: Mobility status PT Visit Diagnosis: Muscle weakness (generalized) (M62.81)    Time: 1130-1155 PT Time Calculation (min) (ACUTE ONLY): 25 min   Charges:   PT Evaluation $PT Eval Low Complexity: 1 Low          D. Royetta Asal PT, DPT 10/29/20, 12:45 PM

## 2020-10-30 DIAGNOSIS — R6520 Severe sepsis without septic shock: Secondary | ICD-10-CM | POA: Diagnosis not present

## 2020-10-30 DIAGNOSIS — A419 Sepsis, unspecified organism: Secondary | ICD-10-CM | POA: Diagnosis not present

## 2020-10-30 LAB — CBC WITH DIFFERENTIAL/PLATELET
Abs Immature Granulocytes: 0.38 10*3/uL — ABNORMAL HIGH (ref 0.00–0.07)
Basophils Absolute: 0 10*3/uL (ref 0.0–0.1)
Basophils Relative: 0 %
Eosinophils Absolute: 0 10*3/uL (ref 0.0–0.5)
Eosinophils Relative: 0 %
HCT: 35.8 % — ABNORMAL LOW (ref 39.0–52.0)
Hemoglobin: 12.3 g/dL — ABNORMAL LOW (ref 13.0–17.0)
Immature Granulocytes: 3 %
Lymphocytes Relative: 3 %
Lymphs Abs: 0.5 10*3/uL — ABNORMAL LOW (ref 0.7–4.0)
MCH: 31.1 pg (ref 26.0–34.0)
MCHC: 34.4 g/dL (ref 30.0–36.0)
MCV: 90.4 fL (ref 80.0–100.0)
Monocytes Absolute: 0.5 10*3/uL (ref 0.1–1.0)
Monocytes Relative: 3 %
Neutro Abs: 13.5 10*3/uL — ABNORMAL HIGH (ref 1.7–7.7)
Neutrophils Relative %: 91 %
Platelets: 215 10*3/uL (ref 150–400)
RBC: 3.96 MIL/uL — ABNORMAL LOW (ref 4.22–5.81)
RDW: 14.5 % (ref 11.5–15.5)
WBC: 14.8 10*3/uL — ABNORMAL HIGH (ref 4.0–10.5)
nRBC: 0 % (ref 0.0–0.2)

## 2020-10-30 LAB — BASIC METABOLIC PANEL
Anion gap: 8 (ref 5–15)
BUN: 24 mg/dL — ABNORMAL HIGH (ref 8–23)
CO2: 26 mmol/L (ref 22–32)
Calcium: 8.9 mg/dL (ref 8.9–10.3)
Chloride: 105 mmol/L (ref 98–111)
Creatinine, Ser: 1.16 mg/dL (ref 0.61–1.24)
GFR, Estimated: >60 mL/min (ref >60)
Glucose, Bld: 188 mg/dL — ABNORMAL HIGH (ref 70–99)
Potassium: 4.4 mmol/L (ref 3.5–5.1)
Sodium: 139 mmol/L (ref 135–145)

## 2020-10-30 MED ORDER — IPRATROPIUM-ALBUTEROL 0.5-2.5 (3) MG/3ML IN SOLN
3.0000 mL | Freq: Three times a day (TID) | RESPIRATORY_TRACT | Status: DC
  Administered 2020-10-30 – 2020-11-01 (×7): 3 mL via RESPIRATORY_TRACT
  Filled 2020-10-30 (×7): qty 3

## 2020-10-30 MED ORDER — HYDROCOD POLST-CPM POLST ER 10-8 MG/5ML PO SUER
5.0000 mL | Freq: Two times a day (BID) | ORAL | Status: DC
  Administered 2020-10-30 – 2020-11-01 (×5): 5 mL via ORAL
  Filled 2020-10-30 (×5): qty 5

## 2020-10-30 MED ORDER — PSYLLIUM 95 % PO PACK
1.0000 | PACK | Freq: Every day | ORAL | Status: DC
  Administered 2020-10-31 – 2020-11-01 (×2): 1 via ORAL
  Filled 2020-10-30 (×3): qty 1

## 2020-10-30 MED ORDER — HYDROCORTISONE NA SUCCINATE PF 100 MG IJ SOLR
50.0000 mg | Freq: Two times a day (BID) | INTRAMUSCULAR | Status: DC
  Administered 2020-10-30 – 2020-10-31 (×3): 50 mg via INTRAVENOUS
  Filled 2020-10-30 (×2): qty 1

## 2020-10-30 NOTE — Progress Notes (Signed)
PROGRESS NOTE    Dakota Harper  [id number] DOB: 1946-07-27 DOA: 10/27/2020 PCP: [facility]  Brief Narrative:  74 y.o. male with medical history significant for hypertension, neuropathy, BPH, COPD who was sent from the skilled nursing facility for evaluation of a fever and low blood pressure.  Patient was said to have a temp of 100.8 F at the skilled nursing facility for which he was treated with Tylenol.  They also documented a low blood pressure.  12/23: Patient was initially very hypotensive requiring multiple fluid boluses and initiation of p.o. vasopressor with midodrine 10 mg 3 times daily. This morning his blood pressure responded appropriately and he was hemodynamically stable. He is in no visible distress.  12/24: Blood pressure much improved.  Patient hemodynamically stable.  Afebrile    Assessment & Plan:   Principal Problem:   Severe sepsis ([place]) Active Problems:   Hypothyroidism   BPH (benign prostatic hyperplasia)   Pneumonia   AKI (acute kidney injury) ([place])   COPD with chronic bronchitis (HCC)   Septic shock (HCC)   Sepsis due to pneumonia ([place])  Septic shock, resolved Severe sepsis secondary to pneumonia -As evidenced by fever with a T-max of 100.8 F, tachypnea with respiratory rate of 24, hypotension requiring IV fluid resuscitation, marked leukocytosis of 26,000 with a left shift and elevated lactic acid of 3.7 -Also had an acute kidney injury -Source of sepsis appears to be right upper lobe pneumonia rule out aspiration pneumonia -Patient received sepsis fluids per protocol in the ER with improvement in his blood pressure -Shock physiology resolved -Midodrine and IV fluids stopped Plan: Taper hydrocortisone to 50 mg every 12 hours.  Plan to de-escalate/discontinue within 24 hours  continue CAP coverage with Rocephin and azithromycin.  Plan for 5-day course Follow cultures, no growth to date Monitor fever curve, no fevers over  interval  Cough Secondary to pneumonia in the setting of underlying lung disease Has been unresponsive to Robitussin and Tessalon Perles Plan: Tussionex every 12 hours  Acute kidney injury Most likely secondary to ATN from hypotension At baseline patient has a serum creatinine of 1.19 and today on admission it is 2.39 Creatinine continues to improve.  Suspect prerenal azotemia versus ATN in setting of sepsis Plan: Continue to hold angiotensin receptor blocker Encourage p.o. fluid intake Repeat renal parameters in a.m.  BPH Continue Flomax  Hypothyroidism Continue Synthroid   COPD Not acutely exacerbated Nebulizer regimen ordered Stress dose steroids as above   DVT prophylaxis: Lovenox Code Status: Full Family Communication: None today  disposition Plan: Status is: Inpatient  Remains inpatient appropriate because:Inpatient level of care appropriate due to severity of illness   Dispo: The patient is from: ALF              Anticipated d/c is to: ALF              Anticipated d/c date is: 1 day              Patient currently is not medically stable to d/c.   Resolving sepsis secondary to pneumonia.  Anticipate medical readiness for discharge in 24 hours             Consultants:   None  Procedures:   None  Antimicrobials:   Ceftriaxone  Azithromycin   Subjective: Patient seen and examined.  Remained stable in no distress.  No pain complaints.  Continues to endorse severe cough.  Objective: Vitals:   10/29/20 1946 10/29/20 1948 10/30/20  0529 10/30/20 0930  BP: 136/70  (!) 143/65 (!) 159/74  Pulse: 89  85 91  Resp: 20  20 18   Temp: 98.3 F (36.8 C)  97.8 F (36.6 C) 98.2 F (36.8 C)  TempSrc:      SpO2: 99% 99% 96% 99%  Weight:      Height:        Intake/Output Summary (Last 24 hours) at 10/30/2020 1021 Last data filed at 10/30/2020 0500 Gross per 24 hour  Intake 508.36 ml  Output 200 ml  Net 308.36 ml   Filed Weights    10/27/20 1103  Weight: 99.8 kg    Examination:  General exam: Appears calm and comfortable  Respiratory system: Scattered end expiratory wheeze. Normal work of breathing. Room air Cardiovascular system: S1 & S2 heard, RRR. No JVD, murmurs, rubs, gallops or clicks. No pedal edema. Gastrointestinal system: Abdomen is nondistended, soft and nontender. No organomegaly or masses felt. Normal bowel sounds heard. Central nervous system: Alert and oriented. No focal neurological deficits. Extremities: Symmetric 5 x 5 power. Skin: No rashes, lesions or ulcers Psychiatry: Judgement and insight appear normal. Mood & affect appropriate.     Data Reviewed: I have personally reviewed following labs and imaging studies  CBC: Recent Labs  Lab 10/27/20 1110 10/28/20 0336 10/30/20 0722  WBC 26.5* 20.0* 14.8*  NEUTROABS 23.8*  --  13.5*  HGB 13.6 13.5 12.3*  HCT 39.6 39.2 35.8*  MCV 91.2 91.8 90.4  PLT 227 200 [id number]   Basic Metabolic Panel: Recent Labs  Lab 10/27/20 1110 10/28/20 0336 10/30/20 0722  NA 137 138 139  K 4.3 4.2 4.4  CL 104 104 105  CO2 20* 25 26  GLUCOSE 126* 90 188*  BUN 26* 23 24*  CREATININE 2.39* 1.60* 1.16  CALCIUM 8.6* 8.5* 8.9   GFR: Estimated Creatinine Clearance: 64 mL/min (by C-G formula based on SCr of 1.16 mg/dL). Liver Function Tests: Recent Labs  Lab 10/27/20 1110  AST 28  ALT 19  ALKPHOS 78  BILITOT 1.8*  PROT 6.2*  ALBUMIN 2.9*   No results for input(s): LIPASE, AMYLASE in the last 168 hours. No results for input(s): AMMONIA in the last 168 hours. Coagulation Profile: Recent Labs  Lab 10/27/20 1110 10/28/20 0336  INR 1.1 1.2   Cardiac Enzymes: No results for input(s): CKTOTAL, CKMB, CKMBINDEX, TROPONINI in the last 168 hours. BNP (last 3 results) No results for input(s): PROBNP in the last 8760 hours. HbA1C: No results for input(s): HGBA1C in the last 72 hours. CBG: No results for input(s): GLUCAP in the last 168 hours. Lipid  Profile: No results for input(s): CHOL, HDL, LDLCALC, TRIG, CHOLHDL, LDLDIRECT in the last 72 hours. Thyroid Function Tests: No results for input(s): TSH, T4TOTAL, FREET4, T3FREE, THYROIDAB in the last 72 hours. Anemia Panel: No results for input(s): VITAMINB12, FOLATE, FERRITIN, TIBC, IRON, RETICCTPCT in the last 72 hours. Sepsis Labs: Recent Labs  Lab 10/27/20 1110 10/27/20 1355 10/27/20 1832 10/28/20 0336  PROCALCITON  --   --  8.10 7.30  LATICACIDVEN 3.7* 3.1* 1.5 1.5    Recent Results (from the past 240 hour(s))  Blood Culture (routine x 2)     Status: None (Preliminary result)   Collection Time: 10/27/20 11:10 AM   Specimen: BLOOD  Result Value Ref Range Status   Specimen Description BLOOD BLOOD RIGHT FOREARM  Final   Special Requests   Final    BOTTLES DRAWN AEROBIC AND ANAEROBIC Blood Culture adequate volume  Culture   Final    NO GROWTH 3 DAYS Performed at [facility], [address] [place], [place] [address]    Report Status PENDING  Incomplete  Blood Culture (routine x 2)     Status: None (Preliminary result)   Collection Time: 10/27/20 11:10 AM   Specimen: BLOOD  Result Value Ref Range Status   Specimen Description BLOOD BLOOD RIGHT HAND  Final   Special Requests   Final    BOTTLES DRAWN AEROBIC AND ANAEROBIC Blood Culture adequate volume   Culture   Final    NO GROWTH 3 DAYS Performed at [facility], [address] [place], [place] [address]    Report Status PENDING  Incomplete  Resp Panel by RT-PCR (Flu A&B, Covid) Nasopharyngeal Swab     Status: None   Collection Time: 10/27/20 11:10 AM   Specimen: Nasopharyngeal Swab; Nasopharyngeal(NP) swabs in vial transport medium  Result Value Ref Range Status   SARS Coronavirus 2 by RT PCR NEGATIVE NEGATIVE Final    Comment: (NOTE) SARS-CoV-2 target nucleic acids are NOT DETECTED.  The SARS-CoV-2 RNA is generally detectable in upper respiratory specimens during the acute phase of  infection. The lowest concentration of SARS-CoV-2 viral copies this assay can detect is 138 copies/mL. A negative result does not preclude SARS-Cov-2 infection and should not be used as the sole basis for treatment or other patient management decisions. A negative result may occur with  improper specimen collection/handling, submission of specimen other than nasopharyngeal swab, presence of viral mutation(s) within the areas targeted by this assay, and inadequate number of viral copies(<138 copies/mL). A negative result must be combined with clinical observations, patient history, and epidemiological information. The expected result is Negative.  Fact Sheet for Patients:  EntrepreneurPulse.com.au  Fact Sheet for Healthcare Providers:  IncredibleEmployment.be  This test is no t yet approved or cleared by the [place] FDA and  has been authorized for detection and/or diagnosis of SARS-CoV-2 by FDA under an Emergency Use Authorization (EUA). This EUA will remain  in effect (meaning this test can be used) for the duration of the COVID-19 declaration under Section 564(b)(1) of the Act, 21 U.S.C.section 360bbb-3(b)(1), unless the authorization is terminated  or revoked sooner.       Influenza A by PCR NEGATIVE NEGATIVE Final   Influenza B by PCR NEGATIVE NEGATIVE Final    Comment: (NOTE) The Xpert Xpress SARS-CoV-2/FLU/RSV plus assay is intended as an aid in the diagnosis of influenza from Nasopharyngeal swab specimens and should not be used as a sole basis for treatment. Nasal washings and aspirates are unacceptable for Xpert Xpress SARS-CoV-2/FLU/RSV testing.  Fact Sheet for Patients: EntrepreneurPulse.com.au  Fact Sheet for Healthcare Providers: IncredibleEmployment.be  This test is not yet approved or cleared by the [place] FDA and has been authorized for detection and/or diagnosis of SARS-CoV-2  by FDA under an Emergency Use Authorization (EUA). This EUA will remain in effect (meaning this test can be used) for the duration of the COVID-19 declaration under Section 564(b)(1) of the Act, 21 U.S.C. section 360bbb-3(b)(1), unless the authorization is terminated or revoked.  Performed at [facility], [address], [place], [place] [address]   Urine culture     Status: Abnormal   Collection Time: 10/27/20  8:51 PM   Specimen: Urine, Random  Result Value Ref Range Status   Specimen Description   Final    URINE, RANDOM Performed at [facility], [place], [place]  [address]    Special Requests   Final    NONE Performed at [facility], [address] [place], [place] [address]    Culture (A)  Final    <10,000 COLONIES/mL INSIGNIFICANT GROWTH Performed at [facility] [address], [place], [place] [address]    Report Status 10/29/2020 FINAL  Final         Radiology Studies: No results found.      Scheduled Meds: . arformoterol  15 mcg Nebulization BID  . aspirin EC  81 mg Oral QHS  . atorvastatin  10 mg Oral Daily  . budesonide (PULMICORT) nebulizer solution  0.25 mg Nebulization BID  . chlorpheniramine-HYDROcodone  5 mL Oral Q12H  . enoxaparin (LOVENOX) injection  40 mg Subcutaneous Q24H  . fenofibrate  54 mg Oral Daily  . finasteride  5 mg Oral Daily  . fluocinonide ointment   Topical BID  . gabapentin  600 mg Oral QHS  . guaiFENesin  600 mg Oral BID  . hydrocortisone sod succinate (SOLU-CORTEF) inj  50 mg Intravenous Q12H  . ipratropium-albuterol  3 mL Nebulization TID  . levothyroxine  50 mcg Oral QAC breakfast  . mirabegron ER  50 mg Oral Daily  . montelukast  5 mg Oral QHS  . multivitamin with minerals  1 tablet Oral Daily  . nortriptyline  150 mg Oral Daily  . pantoprazole  40 mg Oral Daily  . tamsulosin  0.4 mg Oral QHS   Continuous Infusions: . sodium chloride Stopped (10/29/20 2158)  .  azithromycin Stopped (10/29/20 1444)  . cefTRIAXone (ROCEPHIN)  IV Stopped (10/29/20 2228)     LOS: 3 days    Time spent: 15 minutes    Sidney Ace, MD [facility] Pager [phone]  If 7PM-7AM, please contact night-coverage 10/30/2020, 10:21 AM

## 2020-10-31 DIAGNOSIS — R6520 Severe sepsis without septic shock: Secondary | ICD-10-CM | POA: Diagnosis not present

## 2020-10-31 DIAGNOSIS — A419 Sepsis, unspecified organism: Secondary | ICD-10-CM | POA: Diagnosis not present

## 2020-10-31 MED ORDER — METHYLPREDNISOLONE SODIUM SUCC 40 MG IJ SOLR: 40.0000 mg | Freq: Every day | INTRAMUSCULAR | Status: DC

## 2020-10-31 MED ORDER — METHYLPREDNISOLONE SODIUM SUCC 40 MG IJ SOLR
40.0000 mg | Freq: Every day | INTRAMUSCULAR | Status: DC
  Administered 2020-11-01: 40 mg via INTRAVENOUS
  Filled 2020-10-31: qty 1

## 2020-10-31 NOTE — Progress Notes (Signed)
PROGRESS NOTE    Dakota Harper  BJS:[id number] DOB: 05-13-1946 DOA: 10/27/2020 PCP: [facility]  Brief Narrative:  74 y.o. male with medical history significant for hypertension, neuropathy, BPH, COPD who was sent from the skilled nursing facility for evaluation of a fever and low blood pressure.  Patient was said to have a temp of 100.8 F at the skilled nursing facility for which he was treated with Tylenol.  They also documented a low blood pressure.  12/23: Patient was initially very hypotensive requiring multiple fluid boluses and initiation of p.o. vasopressor with midodrine 10 mg 3 times daily. This morning his blood pressure responded appropriately and he was hemodynamically stable. He is in no visible distress.  12/24: Blood pressure much improved.  Patient hemodynamically stable.  Afebrile 12/26: Continues with some cough.  Afebrile over interval.  Blood pressure is improved.     Assessment & Plan:   Principal Problem:   Severe sepsis ([place]) Active Problems:   Hypothyroidism   BPH (benign prostatic hyperplasia)   Pneumonia   AKI (acute kidney injury) ([place])   COPD with chronic bronchitis (HCC)   Septic shock (HCC)   Sepsis due to pneumonia (HCC)  Septic shock, resolved Severe sepsis secondary to pneumonia -As evidenced by fever with a T-max of 100.8 F, tachypnea with respiratory rate of 24, hypotension requiring IV fluid resuscitation, marked leukocytosis of 26,000 with a left shift and elevated lactic acid of 3.7 -Also had an acute kidney injury -Source of sepsis appears to be right upper lobe pneumonia rule out aspiration pneumonia -Patient received sepsis fluids per protocol in the ER with improvement in his blood pressure -Shock physiology resolved -Midodrine and IV fluids stopped Plan: Continue taper hydrocortisone 50 mg daily today Completed azithromycin.  Last dose of Rocephin tomorrow.  5-day course Follow cultures, no growth to date Monitor  fever curve, no fevers over interval  Cough Secondary to pneumonia in the setting of underlying lung disease Has been unresponsive to Robitussin and Tessalon Perles Plan: Cough improving Continue scheduled Tussionex  Acute kidney injury Most likely secondary to ATN from hypotension At baseline patient has a serum creatinine of 1.19 and today on admission it is 2.39 Creatinine continues to improve.  Suspect prerenal azotemia versus ATN in setting of sepsis Plan: Continue to hold angiotensin receptor blocker Encourage p.o. fluid intake Repeat renal parameters in a.m.  BPH Continue Flomax  Hypothyroidism Continue Synthroid   COPD Not acutely exacerbated Nebulizer regimen ordered Stress dose steroids as above   DVT prophylaxis: Lovenox Code Status: Full Family Communication: None today  disposition Plan: Status is: Inpatient  Remains inpatient appropriate because:Inpatient level of care appropriate due to severity of illness   Dispo: The patient is from: ALF              Anticipated d/c is to: ALF              Anticipated d/c date is: 1 day              Patient currently is not medically stable to d/c.  Symptoms improving.  Anticipate medical readiness for discharge in 24 hours.  Anticipate discharge back to assisted living facility 11/01/2020.       Consultants:   None  Procedures:   None  Antimicrobials:   Ceftriaxone  Azithromycin   Subjective: Patient seen and examined.  Endorsing reflux.  Cough improved.  Stable no distress.  No oxygen requirement. Objective: Vitals:   10/30/20 2010 10/31/20  0036 10/31/20 0323 10/31/20 0735  BP: (!) 143/75 140/70 122/60 (!) 156/87  Pulse: 91 90 89 91  Resp: 20 18 20 19   Temp: 98.1 F (36.7 C) 98.4 F (36.9 C) 97.8 F (36.6 C) 97.6 F (36.4 C)  TempSrc: Oral Oral Oral Oral  SpO2: 98% 97% 97% 97%  Weight:      Height:        Intake/Output Summary (Last 24 hours) at 10/31/2020 1029 Last data filed  at 10/31/2020 0202 Gross per 24 hour  Intake 1158.96 ml  Output 200 ml  Net 958.96 ml   Filed Weights   10/27/20 1103  Weight: 99.8 kg    Examination:  General exam: Appears calm and comfortable  Respiratory system: Scattered end expiratory wheeze. Normal work of breathing. Room air Cardiovascular system: S1 & S2 heard, RRR. No JVD, murmurs, rubs, gallops or clicks. No pedal edema. Gastrointestinal system: Abdomen is nondistended, soft and nontender. No organomegaly or masses felt. Normal bowel sounds heard. Central nervous system: Alert and oriented. No focal neurological deficits. Extremities: Symmetric 5 x 5 power. Skin: No rashes, lesions or ulcers Psychiatry: Judgement and insight appear normal. Mood & affect appropriate.     Data Reviewed: I have personally reviewed following labs and imaging studies  CBC: Recent Labs  Lab 10/27/20 1110 10/28/20 0336 10/30/20 0722  WBC 26.5* 20.0* 14.8*  NEUTROABS 23.8*  --  13.5*  HGB 13.6 13.5 12.3*  HCT 39.6 39.2 35.8*  MCV 91.2 91.8 90.4  PLT 227 200 [id number]   Basic Metabolic Panel: Recent Labs  Lab 10/27/20 1110 10/28/20 0336 10/30/20 0722  NA 137 138 139  K 4.3 4.2 4.4  CL 104 104 105  CO2 20* 25 26  GLUCOSE 126* 90 188*  BUN 26* 23 24*  CREATININE 2.39* 1.60* 1.16  CALCIUM 8.6* 8.5* 8.9   GFR: Estimated Creatinine Clearance: 64 mL/min (by C-G formula based on SCr of 1.16 mg/dL). Liver Function Tests: Recent Labs  Lab 10/27/20 1110  AST 28  ALT 19  ALKPHOS 78  BILITOT 1.8*  PROT 6.2*  ALBUMIN 2.9*   No results for input(s): LIPASE, AMYLASE in the last 168 hours. No results for input(s): AMMONIA in the last 168 hours. Coagulation Profile: Recent Labs  Lab 10/27/20 1110 10/28/20 0336  INR 1.1 1.2   Cardiac Enzymes: No results for input(s): CKTOTAL, CKMB, CKMBINDEX, TROPONINI in the last 168 hours. BNP (last 3 results) No results for input(s): PROBNP in the last 8760 hours. HbA1C: No results for  input(s): HGBA1C in the last 72 hours. CBG: No results for input(s): GLUCAP in the last 168 hours. Lipid Profile: No results for input(s): CHOL, HDL, LDLCALC, TRIG, CHOLHDL, LDLDIRECT in the last 72 hours. Thyroid Function Tests: No results for input(s): TSH, T4TOTAL, FREET4, T3FREE, THYROIDAB in the last 72 hours. Anemia Panel: No results for input(s): VITAMINB12, FOLATE, FERRITIN, TIBC, IRON, RETICCTPCT in the last 72 hours. Sepsis Labs: Recent Labs  Lab 10/27/20 1110 10/27/20 1355 10/27/20 1832 10/28/20 0336  PROCALCITON  --   --  8.10 7.30  LATICACIDVEN 3.7* 3.1* 1.5 1.5    Recent Results (from the past 240 hour(s))  Blood Culture (routine x 2)     Status: None (Preliminary result)   Collection Time: 10/27/20 11:10 AM   Specimen: BLOOD  Result Value Ref Range Status   Specimen Description BLOOD BLOOD RIGHT FOREARM  Final   Special Requests   Final    BOTTLES DRAWN AEROBIC AND ANAEROBIC Blood  Culture adequate volume   Culture   Final    NO GROWTH 4 DAYS Performed at [facility], [address] [place], [place] [address]    Report Status PENDING  Incomplete  Blood Culture (routine x 2)     Status: None (Preliminary result)   Collection Time: 10/27/20 11:10 AM   Specimen: BLOOD  Result Value Ref Range Status   Specimen Description BLOOD BLOOD RIGHT HAND  Final   Special Requests   Final    BOTTLES DRAWN AEROBIC AND ANAEROBIC Blood Culture adequate volume   Culture   Final    NO GROWTH 4 DAYS Performed at [facility], [address], [place], [place] [address]    Report Status PENDING  Incomplete  Resp Panel by RT-PCR (Flu A&B, Covid) Nasopharyngeal Swab     Status: None   Collection Time: 10/27/20 11:10 AM   Specimen: Nasopharyngeal Swab; Nasopharyngeal(NP) swabs in vial transport medium  Result Value Ref Range Status   SARS Coronavirus 2 by RT PCR NEGATIVE NEGATIVE Final    Comment: (NOTE) SARS-CoV-2 target nucleic acids are NOT  DETECTED.  The SARS-CoV-2 RNA is generally detectable in upper respiratory specimens during the acute phase of infection. The lowest concentration of SARS-CoV-2 viral copies this assay can detect is 138 copies/mL. A negative result does not preclude SARS-Cov-2 infection and should not be used as the sole basis for treatment or other patient management decisions. A negative result may occur with  improper specimen collection/handling, submission of specimen other than nasopharyngeal swab, presence of viral mutation(s) within the areas targeted by this assay, and inadequate number of viral copies(<138 copies/mL). A negative result must be combined with clinical observations, patient history, and epidemiological information. The expected result is Negative.  Fact Sheet for Patients:  EntrepreneurPulse.com.au  Fact Sheet for Healthcare Providers:  IncredibleEmployment.be  This test is no t yet approved or cleared by the [place] FDA and  has been authorized for detection and/or diagnosis of SARS-CoV-2 by FDA under an Emergency Use Authorization (EUA). This EUA will remain  in effect (meaning this test can be used) for the duration of the COVID-19 declaration under Section 564(b)(1) of the Act, 21 U.S.C.section 360bbb-3(b)(1), unless the authorization is terminated  or revoked sooner.       Influenza A by PCR NEGATIVE NEGATIVE Final   Influenza B by PCR NEGATIVE NEGATIVE Final    Comment: (NOTE) The Xpert Xpress SARS-CoV-2/FLU/RSV plus assay is intended as an aid in the diagnosis of influenza from Nasopharyngeal swab specimens and should not be used as a sole basis for treatment. Nasal washings and aspirates are unacceptable for Xpert Xpress SARS-CoV-2/FLU/RSV testing.  Fact Sheet for Patients: EntrepreneurPulse.com.au  Fact Sheet for Healthcare Providers: IncredibleEmployment.be  This test is not yet  approved or cleared by the [place] FDA and has been authorized for detection and/or diagnosis of SARS-CoV-2 by FDA under an Emergency Use Authorization (EUA). This EUA will remain in effect (meaning this test can be used) for the duration of the COVID-19 declaration under Section 564(b)(1) of the Act, 21 U.S.C. section 360bbb-3(b)(1), unless the authorization is terminated or revoked.  Performed at [facility], [address], [place], [place] [address]   Urine culture     Status: Abnormal   Collection Time: 10/27/20  8:51 PM   Specimen: Urine, Random  Result Value Ref Range Status   Specimen Description   Final    URINE, RANDOM Performed at [facility], [address] [place], [place] [address]    Special Requests   Final    NONE Performed at [facility], [address] [place], [place] [address]    Culture (A)  Final    <10,000 COLONIES/mL INSIGNIFICANT GROWTH Performed at [place] [address] [place], [place] [address]    Report Status 10/29/2020 FINAL  Final         Radiology Studies: No results found.      Scheduled Meds: . arformoterol  15 mcg Nebulization BID  . aspirin EC  81 mg Oral QHS  . atorvastatin  10 mg Oral Daily  . budesonide (PULMICORT) nebulizer solution  0.25 mg Nebulization BID  . chlorpheniramine-HYDROcodone  5 mL Oral Q12H  . enoxaparin (LOVENOX) injection  40 mg Subcutaneous Q24H  . fenofibrate  54 mg Oral Daily  . finasteride  5 mg Oral Daily  . fluocinonide ointment   Topical BID  . gabapentin  600 mg Oral QHS  . guaiFENesin  600 mg Oral BID  . ipratropium-albuterol  3 mL Nebulization TID  . levothyroxine  50 mcg Oral QAC breakfast  . [START ON 11/01/2020] methylPREDNISolone (SOLU-MEDROL) injection  40 mg Intravenous Daily  . mirabegron ER  50 mg Oral Daily  . montelukast  5 mg Oral QHS  . multivitamin with minerals  1 tablet Oral Daily  . nortriptyline  150 mg Oral Daily  .  pantoprazole  40 mg Oral Daily  . psyllium  1 packet Oral Daily  . tamsulosin  0.4 mg Oral QHS   Continuous Infusions: . sodium chloride Stopped (10/30/20 1530)  . azithromycin 500 mg (10/30/20 1516)  . cefTRIAXone (ROCEPHIN)  IV Stopped (10/30/20 2255)     LOS: 4 days    Time spent: 15 minutes    Sidney Ace, MD [facility] Pager [phone]  If 7PM-7AM, please contact night-coverage 10/31/2020, 10:29 AM

## 2020-11-01 DIAGNOSIS — R6520 Severe sepsis without septic shock: Secondary | ICD-10-CM | POA: Diagnosis not present

## 2020-11-01 DIAGNOSIS — A419 Sepsis, unspecified organism: Secondary | ICD-10-CM | POA: Diagnosis not present

## 2020-11-01 LAB — CBC WITH DIFFERENTIAL/PLATELET
Abs Immature Granulocytes: 1.44 10*3/uL — ABNORMAL HIGH (ref 0.00–0.07)
Basophils Absolute: 0 10*3/uL (ref 0.0–0.1)
Basophils Relative: 0 %
Eosinophils Absolute: 0.3 10*3/uL (ref 0.0–0.5)
Eosinophils Relative: 2 %
HCT: 37.4 % — ABNORMAL LOW (ref 39.0–52.0)
Hemoglobin: 12.3 g/dL — ABNORMAL LOW (ref 13.0–17.0)
Immature Granulocytes: 11 %
Lymphocytes Relative: 11 %
Lymphs Abs: 1.4 10*3/uL (ref 0.7–4.0)
MCH: 30.4 pg (ref 26.0–34.0)
MCHC: 32.9 g/dL (ref 30.0–36.0)
MCV: 92.3 fL (ref 80.0–100.0)
Monocytes Absolute: 0.9 10*3/uL (ref 0.1–1.0)
Monocytes Relative: 7 %
Neutro Abs: 9 10*3/uL — ABNORMAL HIGH (ref 1.7–7.7)
Neutrophils Relative %: 69 %
Platelets: 235 10*3/uL (ref 150–400)
RBC: 4.05 MIL/uL — ABNORMAL LOW (ref 4.22–5.81)
RDW: 14.6 % (ref 11.5–15.5)
WBC: 13.6 10*3/uL — ABNORMAL HIGH (ref 4.0–10.5)
nRBC: 0 % (ref 0.0–0.2)

## 2020-11-01 LAB — CULTURE, BLOOD (ROUTINE X 2)
Culture: NO GROWTH
Culture: NO GROWTH
Special Requests: ADEQUATE
Special Requests: ADEQUATE

## 2020-11-01 LAB — BASIC METABOLIC PANEL
Anion gap: 8 (ref 5–15)
BUN: 22 mg/dL (ref 8–23)
CO2: 30 mmol/L (ref 22–32)
Calcium: 8.8 mg/dL — ABNORMAL LOW (ref 8.9–10.3)
Chloride: 100 mmol/L (ref 98–111)
Creatinine, Ser: 1.03 mg/dL (ref 0.61–1.24)
GFR, Estimated: >60 mL/min (ref >60)
Glucose, Bld: 98 mg/dL (ref 70–99)
Potassium: 4.4 mmol/L (ref 3.5–5.1)
Sodium: 138 mmol/L (ref 135–145)

## 2020-11-01 MED ORDER — FENOFIBRATE 54 MG PO TABS: 54.0000 mg | ORAL_TABLET | Freq: Every day | ORAL | Status: AC

## 2020-11-01 MED ORDER — GUAIFENESIN-DM 100-10 MG/5ML PO SYRP: 5.0000 mL | ORAL_SOLUTION | ORAL | 0 refills | Status: DC | PRN

## 2020-11-01 NOTE — Care Management Important Message (Signed)
Important Message  Patient Details  Name: Dakota Harper MRN: [id number] Date of Birth: June 12, 1946   Medicare Important Message Given:  N/A - LOS <3 / Initial given by admissions  Initial Medicare IM reviewed with patient by Francesca Oman, Patient Access Associate on 10/31/2020 at 12:50pm.    Johnell Comings 11/01/2020, 9:19 AM

## 2020-11-01 NOTE — Discharge Summary (Signed)
Physician Discharge Summary  [facility] Dakota Harper [id number] DOB: 1946-09-01 DOA: 10/27/2020  PCP: [facility]  Admit date: 10/27/2020 Discharge date: 11/01/2020  Admitted From: ALF Disposition: ALF  Recommendations for Outpatient Follow-up:  1. Follow up with PCP in 1-2 weeks 2.   Home Health:No Equipment/Devices:None Discharge Condition:Stable CODE STATUS: FULL Diet recommendation: Heart Healthy  Brief/Interim Summary:  74 y.o.malewith medical history significant forhypertension, neuropathy, BPH,COPD who was sent from the skilled nursing facility for evaluation of a fever and low blood pressure. Patient was said to have a temp of 100.8 Fat the skilled nursing facility for which he was treated with Tylenol. They also documented a low blood pressure.  12/23: Patient was initially very hypotensive requiring multiple fluid boluses and initiation of p.o. vasopressor with midodrine 10 mg 3 times daily. This morning his blood pressure responded appropriately and he was hemodynamically stable. He is in no visible distress.  12/24: Blood pressure much improved.  Patient hemodynamically stable.  Afebrile 12/26: Continues with some cough.  Afebrile over interval.  Blood pressure is improved.  12/27: Patient improved.  Midodrine and stress dose steroids stopped.  Completed 5 day course of treatment for CAP.  No abx on discharge.  Can resume home medication regimen.  Patient with some residual cough, without desaturation.  Discharge Diagnoses:  Principal Problem:   Severe sepsis ([place]) Active Problems:   Hypothyroidism   BPH (benign prostatic hyperplasia)   Pneumonia   AKI (acute kidney injury) ([place])   COPD with chronic bronchitis (HCC)   Septic shock (HCC)   Sepsis due to pneumonia ([place])  Septic shock, resolved Severe sepsis secondary to pneumonia -As evidenced by fever with a T-max of100.8 F,tachypnea with respiratory rate of 24, hypotension requiring IV fluid  resuscitation, marked leukocytosis of 26,000 with a left shift and elevated lactic acid of 3.7 -Also had an acute kidney injury -Source of sepsis appears to be right upper lobe pneumoniarule out aspiration pneumonia -Patient received sepsis fluids perprotocol in the ERwith improvement in his blood pressure -Shock physiology resolved -Midodrine and IV fluids stopped - No steroids on DC - Completed course of antitibiotics in house - No antibiotics indicated on dc - Stable for discharge to ALF  Cough Secondary to pneumonia in the setting of underlying lung disease Has been unresponsive to Robitussin and Tessalon Perles Plan: Cough improving Can resume PTA cough suppression regimen  Acute kidney injury Most likely secondary to ATN from hypotension At baseline patient has a serum creatinine of 1.19 and today on admission it is 2.39 Creatinine continues to improve.  Suspect prerenal azotemia versus ATN in setting of sepsis - Cr baseline at time of dc - Resume home med regimen  BPH Continue Flomax  Hypothyroidism Continue Synthroid   COPD Not acutely exacerbated On room air Resume home regimen on dc  Discharge Instructions  Discharge Instructions    Diet - low sodium heart healthy   Complete by: As directed    Increase activity slowly   Complete by: As directed      Allergies as of 11/01/2020      Reactions   Saw Palmetto Hives, Rash   "Berry form"   Other Other (See Comments), Rash   Sulfa Antibiotics Rash   Sulfacetamide Sodium Rash      Medication List    TAKE these medications   acetaminophen 500 MG tablet Commonly known as: TYLENOL Take 500 mg by mouth every 4 (four) hours as needed for moderate pain or mild pain.  acetaminophen 325 MG tablet Commonly known as: TYLENOL Take 650 mg by mouth every 4 (four) hours as needed for mild pain.   albuterol 108 (90 Base) MCG/ACT inhaler Commonly known as: VENTOLIN HFA Inhale 1 puff into the lungs 3  (three) times daily as needed for wheezing or shortness of breath.   alum & mag hydroxide-simeth 200-200-20 MG/5ML suspension Commonly known as: MAALOX/MYLANTA Take 30 mLs by mouth 4 (four) times daily as needed for indigestion or heartburn.   aspirin EC 81 MG tablet Take 81 mg by mouth at bedtime.   atorvastatin 10 MG tablet Commonly known as: LIPITOR Take 10 mg by mouth every evening.   barrier cream Crea Commonly known as: non-specified Apply 1 application topically as needed (post-toileting skin protection).   Breo Ellipta 200-25 MCG/INH Aepb Generic drug: fluticasone furoate-vilanterol Inhale 1 puff into the lungs daily.   chlorhexidine 0.12 % solution Commonly known as: PERIDEX Use as directed 15 mLs in the mouth or throat 2 (two) times daily as needed (oral discomfort).   cycloSPORINE 0.05 % ophthalmic emulsion Commonly known as: RESTASIS Place 1 drop into both eyes 2 (two) times daily as needed (dry eyes).   diphenhydrAMINE 25 MG tablet Commonly known as: BENADRYL Take 25 mg by mouth every 6 (six) hours as needed for itching or allergies.   Ear Wax Removal Drops 6.5 % OTIC solution Generic drug: carbamide peroxide Place 5 drops into both ears every Sunday.   famotidine 40 MG tablet Commonly known as: PEPCID Take 40 mg by mouth every evening.   fenofibrate 54 MG tablet Take 1 tablet (54 mg total) by mouth daily. What changed:   medication strength  how much to take  when to take this   finasteride 5 MG tablet Commonly known as: PROSCAR Take 1 tablet (5 mg total) by mouth daily.   fluocinonide 0.05 % external solution Commonly known as: LIDEX Apply 1 application topically 2 (two) times daily.   fluocinonide ointment 0.05 % Commonly known as: LIDEX Apply 1 application topically 2 (two) times daily as needed (pruritus). (apply to legs)   gabapentin 100 MG capsule Commonly known as: NEURONTIN Take 200 mg by mouth at bedtime. (take with 600mg  tablet  to equal 800mg  total)   gabapentin 600 MG tablet Commonly known as: NEURONTIN Take 600 mg by mouth at bedtime. (take with 100mg  capsules to equal 800mg  total)   guaiFENesin 100 MG/5ML liquid Commonly known as: ROBITUSSIN Take 300 mg by mouth every 6 (six) hours as needed for cough.   guaiFENesin-dextromethorphan 100-10 MG/5ML syrup Commonly known as: ROBITUSSIN DM Take 5 mLs by mouth every 4 (four) hours as needed for cough.   hydrocortisone 1 % ointment Apply 1 application topically daily as needed for itching.   ibuprofen 400 MG tablet Commonly known as: ADVIL Take 400 mg by mouth 3 (three) times daily as needed for mild pain or moderate pain.   ipratropium-albuterol 0.5-2.5 (3) MG/3ML Soln Commonly known as: DUONEB Take 3 mLs by nebulization 2 (two) times daily.   levothyroxine 50 MCG tablet Commonly known as: SYNTHROID Take 50 mcg daily before breakfast by mouth.   loperamide 2 MG capsule Commonly known as: IMODIUM Take 4 mg by mouth as needed for diarrhea or loose stools ((max 8 doses in 24 hours)).   losartan 25 MG tablet Commonly known as: COZAAR Take 25 mg by mouth daily.   magnesium hydroxide 400 MG/5ML suspension Commonly known as: MILK OF MAGNESIA Take 30 mLs by mouth daily  as needed for mild constipation or moderate constipation.   mirabegron ER 50 MG Tb24 tablet Commonly known as: MYRBETRIQ Take 50 mg by mouth daily.   montelukast 5 MG chewable tablet Commonly known as: SINGULAIR Chew 5 mg by mouth at bedtime.   Multi-Vitamins Tabs Take 1 tablet by mouth daily.   nortriptyline 75 MG capsule Commonly known as: PAMELOR Take 150 mg by mouth daily.   omeprazole 10 MG capsule Commonly known as: PRILOSEC Take 10 mg by mouth daily.   polyethylene glycol 17 g packet Commonly known as: MIRALAX / GLYCOLAX Take 17 g by mouth daily as needed for mild constipation or moderate constipation.   tamsulosin 0.4 MG Caps capsule Commonly known as:  FLOMAX Take 0.4 mg by mouth at bedtime.   tiZANidine 2 MG tablet Commonly known as: ZANAFLEX Take 2 mg by mouth at bedtime.       Allergies  Allergen Reactions  . Saw Palmetto Hives and Rash    "Berry form"  . Other Other (See Comments) and Rash  . Sulfa Antibiotics Rash  . Sulfacetamide Sodium Rash    Consultations:  None   Procedures/Studies: DG Chest Port 1 View  Result Date: 10/27/2020 CLINICAL DATA:  Fever EXAM: PORTABLE CHEST 1 VIEW COMPARISON:  11/25/2019 FINDINGS: Heart size within normal limits. Low lung volumes. Atherosclerotic calcification of the aortic knob. Patchy airspace opacity within the right upper lobe. Bibasilar interstitial prominence. No large pleural fluid collection. No pneumothorax. IMPRESSION: Patchy airspace opacity within the right upper lobe, suspicious for pneumonia. Electronically Signed   By: Davina Poke D.O.   On: 10/27/2020 11:31    (Echo, Carotid, EGD, Colonoscopy, ERCP)    Subjective: Seen and examined on the day of discharge.  Stable, in NAD.  On room air.  No pain complaints.  Stable for dc to ALF   Discharge Exam: Vitals:   10/31/20 2323 11/01/20 0526  BP: (!) 145/73 (!) 156/81  Pulse: 88 83  Resp: 18 18  Temp: 98.2 F (36.8 C) 98.2 F (36.8 C)  SpO2: 97% 95%   Vitals:   10/31/20 1955 10/31/20 2047 10/31/20 2323 11/01/20 0526  BP:  (!) 160/82 (!) 145/73 (!) 156/81  Pulse:  100 88 83  Resp:  18 18 18   Temp:  (!) 97.5 F (36.4 C) 98.2 F (36.8 C) 98.2 F (36.8 C)  TempSrc:  Oral Oral Oral  SpO2: 98% 97% 97% 95%  Weight:      Height:        General: Pt is alert, awake, not in acute distress Cardiovascular: RRR, S1/S2 +, no rubs, no gallops Respiratory: Scattered wheeze, normal WOB.  Room air Abdominal: Soft, NT, ND, bowel sounds + Extremities: no edema, no cyanosis    The results of significant diagnostics from this hospitalization (including imaging, microbiology, ancillary and laboratory) are listed  below for reference.     Microbiology: Recent Results (from the past 240 hour(s))  Blood Culture (routine x 2)     Status: None   Collection Time: 10/27/20 11:10 AM   Specimen: BLOOD  Result Value Ref Range Status   Specimen Description BLOOD BLOOD RIGHT FOREARM  Final   Special Requests   Final    BOTTLES DRAWN AEROBIC AND ANAEROBIC Blood Culture adequate volume   Culture   Final    NO GROWTH 5 DAYS Performed at [facility], [address] [place], [place] [address]    Report Status 11/01/2020 FINAL  Final  Blood Culture (routine  x 2)     Status: None   Collection Time: 10/27/20 11:10 AM   Specimen: BLOOD  Result Value Ref Range Status   Specimen Description BLOOD BLOOD RIGHT HAND  Final   Special Requests   Final    BOTTLES DRAWN AEROBIC AND ANAEROBIC Blood Culture adequate volume   Culture   Final    NO GROWTH 5 DAYS Performed at [facility], [address] [place], [place] [address]    Report Status 11/01/2020 FINAL  Final  Resp Panel by RT-PCR (Flu A&B, Covid) Nasopharyngeal Swab     Status: None   Collection Time: 10/27/20 11:10 AM   Specimen: Nasopharyngeal Swab; Nasopharyngeal(NP) swabs in vial transport medium  Result Value Ref Range Status   SARS Coronavirus 2 by RT PCR NEGATIVE NEGATIVE Final    Comment: (NOTE) SARS-CoV-2 target nucleic acids are NOT DETECTED.  The SARS-CoV-2 RNA is generally detectable in upper respiratory specimens during the acute phase of infection. The lowest concentration of SARS-CoV-2 viral copies this assay can detect is 138 copies/mL. A negative result does not preclude SARS-Cov-2 infection and should not be used as the sole basis for treatment or other patient management decisions. A negative result may occur with  improper specimen collection/handling, submission of specimen other than nasopharyngeal swab, presence of viral mutation(s) within the areas targeted by this assay, and inadequate number of  viral copies(<138 copies/mL). A negative result must be combined with clinical observations, patient history, and epidemiological information. The expected result is Negative.  Fact Sheet for Patients:  EntrepreneurPulse.com.au  Fact Sheet for Healthcare Providers:  IncredibleEmployment.be  This test is no t yet approved or cleared by the [place] FDA and  has been authorized for detection and/or diagnosis of SARS-CoV-2 by FDA under an Emergency Use Authorization (EUA). This EUA will remain  in effect (meaning this test can be used) for the duration of the COVID-19 declaration under Section 564(b)(1) of the Act, 21 U.S.C.section 360bbb-3(b)(1), unless the authorization is terminated  or revoked sooner.       Influenza A by PCR NEGATIVE NEGATIVE Final   Influenza B by PCR NEGATIVE NEGATIVE Final    Comment: (NOTE) The Xpert Xpress SARS-CoV-2/FLU/RSV plus assay is intended as an aid in the diagnosis of influenza from Nasopharyngeal swab specimens and should not be used as a sole basis for treatment. Nasal washings and aspirates are unacceptable for Xpert Xpress SARS-CoV-2/FLU/RSV testing.  Fact Sheet for Patients: EntrepreneurPulse.com.au  Fact Sheet for Healthcare Providers: IncredibleEmployment.be  This test is not yet approved or cleared by the [place] FDA and has been authorized for detection and/or diagnosis of SARS-CoV-2 by FDA under an Emergency Use Authorization (EUA). This EUA will remain in effect (meaning this test can be used) for the duration of the COVID-19 declaration under Section 564(b)(1) of the Act, 21 U.S.C. section 360bbb-3(b)(1), unless the authorization is terminated or revoked.  Performed at [facility], [address] [place], [place] [address]   Urine culture     Status: Abnormal   Collection Time: 10/27/20  8:51 PM   Specimen: Urine, Random  Result  Value Ref Range Status   Specimen Description   Final    URINE, RANDOM Performed at [facility], [address], [place], [place] [address]    Special Requests   Final    NONE Performed at [facility], [address], [place], [place] [address]    Culture (A)  Final    <10,000  COLONIES/mL INSIGNIFICANT GROWTH Performed at [facility] [address] [place], [place] [address]    Report Status 10/29/2020 FINAL  Final     Labs: BNP (last 3 results) Recent Labs    10/27/20 1110  BNP [id number]   Basic Metabolic Panel: Recent Labs  Lab 10/27/20 1110 10/28/20 0336 10/30/20 0722 11/01/20 0719  NA 137 138 139 138  K 4.3 4.2 4.4 4.4  CL 104 104 105 100  CO2 20* 25 26 30   GLUCOSE 126* 90 188* 98  BUN 26* 23 24* 22  CREATININE 2.39* 1.60* 1.16 1.03  CALCIUM 8.6* 8.5* 8.9 8.8*   Liver Function Tests: Recent Labs  Lab 10/27/20 1110  AST 28  ALT 19  ALKPHOS 78  BILITOT 1.8*  PROT 6.2*  ALBUMIN 2.9*   No results for input(s): LIPASE, AMYLASE in the last 168 hours. No results for input(s): AMMONIA in the last 168 hours. CBC: Recent Labs  Lab 10/27/20 1110 10/28/20 0336 10/30/20 0722 11/01/20 0719  WBC 26.5* 20.0* 14.8* 13.6*  NEUTROABS 23.8*  --  13.5* 9.0*  HGB 13.6 13.5 12.3* 12.3*  HCT 39.6 39.2 35.8* 37.4*  MCV 91.2 91.8 90.4 92.3  PLT 227 200 215 235   Cardiac Enzymes: No results for input(s): CKTOTAL, CKMB, CKMBINDEX, TROPONINI in the last 168 hours. BNP: Invalid input(s): POCBNP CBG: No results for input(s): GLUCAP in the last 168 hours. D-Dimer No results for input(s): DDIMER in the last 72 hours. Hgb A1c No results for input(s): HGBA1C in the last 72 hours. Lipid Profile No results for input(s): CHOL, HDL, LDLCALC, TRIG, CHOLHDL, LDLDIRECT in the last 72 hours. Thyroid function studies No results for input(s): TSH, T4TOTAL, T3FREE, THYROIDAB in the last 72 hours.  Invalid input(s): FREET3 Anemia work up No results  for input(s): VITAMINB12, FOLATE, FERRITIN, TIBC, IRON, RETICCTPCT in the last 72 hours. Urinalysis    Component Value Date/Time   COLORURINE YELLOW (A) 10/29/2020 1148   APPEARANCEUR HAZY (A) 10/29/2020 1148   APPEARANCEUR Clear 09/17/2017 0905   LABSPEC 1.026 10/29/2020 1148   PHURINE 5.0 10/29/2020 1148   GLUCOSEU 150 (A) 10/29/2020 1148   HGBUR NEGATIVE 10/29/2020 1148   BILIRUBINUR NEGATIVE 10/29/2020 1148   BILIRUBINUR Negative 09/17/2017 0905   KETONESUR NEGATIVE 10/29/2020 1148   PROTEINUR NEGATIVE 10/29/2020 1148   NITRITE NEGATIVE 10/29/2020 1148   LEUKOCYTESUR NEGATIVE 10/29/2020 1148   Sepsis Labs Invalid input(s): PROCALCITONIN,  WBC,  LACTICIDVEN Microbiology Recent Results (from the past 240 hour(s))  Blood Culture (routine x 2)     Status: None   Collection Time: 10/27/20 11:10 AM   Specimen: BLOOD  Result Value Ref Range Status   Specimen Description BLOOD BLOOD RIGHT FOREARM  Final   Special Requests   Final    BOTTLES DRAWN AEROBIC AND ANAEROBIC Blood Culture adequate volume   Culture   Final    NO GROWTH 5 DAYS Performed at [facility], [address] [place], [place] [address]    Report Status 11/01/2020 FINAL  Final  Blood Culture (routine x 2)     Status: None   Collection Time: 10/27/20 11:10 AM   Specimen: BLOOD  Result Value Ref Range Status   Specimen Description BLOOD BLOOD RIGHT HAND  Final   Special Requests   Final    BOTTLES DRAWN AEROBIC AND ANAEROBIC Blood Culture adequate volume   Culture   Final    NO GROWTH 5 DAYS Performed at [facility], [place]  [address] [place], [place] [address]    Report Status 11/01/2020 FINAL  Final  Resp Panel by RT-PCR (Flu A&B, Covid) Nasopharyngeal Swab     Status: None   Collection Time: 10/27/20 11:10 AM   Specimen: Nasopharyngeal Swab; Nasopharyngeal(NP) swabs in vial transport medium  Result Value Ref Range Status   SARS Coronavirus 2 by RT PCR NEGATIVE NEGATIVE Final     Comment: (NOTE) SARS-CoV-2 target nucleic acids are NOT DETECTED.  The SARS-CoV-2 RNA is generally detectable in upper respiratory specimens during the acute phase of infection. The lowest concentration of SARS-CoV-2 viral copies this assay can detect is 138 copies/mL. A negative result does not preclude SARS-Cov-2 infection and should not be used as the sole basis for treatment or other patient management decisions. A negative result may occur with  improper specimen collection/handling, submission of specimen other than nasopharyngeal swab, presence of viral mutation(s) within the areas targeted by this assay, and inadequate number of viral copies(<138 copies/mL). A negative result must be combined with clinical observations, patient history, and epidemiological information. The expected result is Negative.  Fact Sheet for Patients:  EntrepreneurPulse.com.au  Fact Sheet for Healthcare Providers:  IncredibleEmployment.be  This test is no t yet approved or cleared by the [place] FDA and  has been authorized for detection and/or diagnosis of SARS-CoV-2 by FDA under an Emergency Use Authorization (EUA). This EUA will remain  in effect (meaning this test can be used) for the duration of the COVID-19 declaration under Section 564(b)(1) of the Act, 21 U.S.C.section 360bbb-3(b)(1), unless the authorization is terminated  or revoked sooner.       Influenza A by PCR NEGATIVE NEGATIVE Final   Influenza B by PCR NEGATIVE NEGATIVE Final    Comment: (NOTE) The Xpert Xpress SARS-CoV-2/FLU/RSV plus assay is intended as an aid in the diagnosis of influenza from Nasopharyngeal swab specimens and should not be used as a sole basis for treatment. Nasal washings and aspirates are unacceptable for Xpert Xpress SARS-CoV-2/FLU/RSV testing.  Fact Sheet for Patients: EntrepreneurPulse.com.au  Fact Sheet for Healthcare  Providers: IncredibleEmployment.be  This test is not yet approved or cleared by the [place] FDA and has been authorized for detection and/or diagnosis of SARS-CoV-2 by FDA under an Emergency Use Authorization (EUA). This EUA will remain in effect (meaning this test can be used) for the duration of the COVID-19 declaration under Section 564(b)(1) of the Act, 21 U.S.C. section 360bbb-3(b)(1), unless the authorization is terminated or revoked.  Performed at [facility], [address], [place], [place] [address]   Urine culture     Status: Abnormal   Collection Time: 10/27/20  8:51 PM   Specimen: Urine, Random  Result Value Ref Range Status   Specimen Description   Final    URINE, RANDOM Performed at [facility], [address], [place], [place] [address]    Special Requests   Final    NONE Performed at [facility], [address] [place], [place] [address]    Culture (A)  Final    <10,000 COLONIES/mL INSIGNIFICANT GROWTH Performed at [facility], [place] [address] [place], [place] [address]    Report Status 10/29/2020 FINAL  Final     Time coordinating discharge: Over 30 minutes  SIGNED:   Sidney Ace, MD  [facility] 11/01/2020, 8:29 AM Pager   If 7PM-7AM, please contact night-coverage

## 2020-11-01 NOTE — [place] FL2 (Signed)
[place] MEDICAID FL2 LEVEL OF CARE SCREENING TOOL     IDENTIFICATION  Patient Name: Dakota Harper Birthdate: Jun 14, 1946 Sex: male Admission Date (Current Location): 10/27/2020  [facility] and [place] Number:  Chiropodist and Address:         Provider Number: [id number]  Attending Physician Name and Address:  Tresa Moore, MD  Relative Name and Phone Number:       Current Level of Care: Hospital Recommended Level of Care: Assisted Living Facility Prior Approval Number:    Date Approved/Denied:   PASRR Number:    Discharge Plan: Other (Comment) (ALF)    Current Diagnoses: Patient Active Problem List   Diagnosis Date Noted  . Sepsis due to pneumonia (HCC) 10/28/2020  . Severe sepsis (HCC) 10/27/2020  . AKI (acute kidney injury) (HCC) 10/27/2020  . COPD with chronic bronchitis (HCC) 10/27/2020  . Septic shock (HCC) 10/27/2020  . Ingrown left big toenail 10/25/2020  . Ingrown nail of great toe of right foot 10/25/2020  . Pneumonia 11/26/2019  . Community acquired bacterial pneumonia 11/25/2019  . Sepsis (HCC) 11/25/2019  . Weakness 11/25/2019  . CKD (chronic kidney disease), stage III (HCC) 11/25/2019  . Hypothyroidism 11/25/2019  . BPH (benign prostatic hyperplasia) 11/25/2019  . Headache 11/25/2019  . Tremors of nervous system 11/25/2019  . Thrombocytosis 11/25/2019  . Elevated bilirubin 11/25/2019    Orientation RESPIRATION BLADDER Height & Weight     Self,Time,Situation,Place  Normal Continent Weight: 99.8 kg Height:  5\' 8"  (172.7 cm)  BEHAVIORAL SYMPTOMS/MOOD NEUROLOGICAL BOWEL NUTRITION STATUS      Continent Diet (Heart Healthy)  AMBULATORY STATUS COMMUNICATION OF NEEDS Skin   Independent Verbally Bruising                       Personal Care Assistance Level of Assistance              Functional Limitations Info             SPECIAL CARE FACTORS FREQUENCY                       Contractures Contractures  Info: Not present    Additional Factors Info  Allergies Code Status Info: Full Allergies Info: Saw Palmetto, Other, Sulfa Antibiotics, Sulfacetamide Sodium           TAKE these medications   acetaminophen 500 MG tablet Commonly known as: TYLENOL Take 500 mg by mouth every 4 (four) hours as needed for moderate pain or mild pain.   acetaminophen 325 MG tablet Commonly known as: TYLENOL Take 650 mg by mouth every 4 (four) hours as needed for mild pain.   albuterol 108 (90 Base) MCG/ACT inhaler Commonly known as: VENTOLIN HFA Inhale 1 puff into the lungs 3 (three) times daily as needed for wheezing or shortness of breath.   alum & mag hydroxide-simeth 200-200-20 MG/5ML suspension Commonly known as: MAALOX/MYLANTA Take 30 mLs by mouth 4 (four) times daily as needed for indigestion or heartburn.   aspirin EC 81 MG tablet Take 81 mg by mouth at bedtime.   atorvastatin 10 MG tablet Commonly known as: LIPITOR Take 10 mg by mouth every evening.   barrier cream Crea Commonly known as: non-specified Apply 1 application topically as needed (post-toileting skin protection).   Breo Ellipta 200-25 MCG/INH Aepb Generic drug: fluticasone furoate-vilanterol Inhale 1 puff into the lungs daily.   chlorhexidine 0.12 % solution Commonly known as: PERIDEX Use  as directed 15 mLs in the mouth or throat 2 (two) times daily as needed (oral discomfort).   cycloSPORINE 0.05 % ophthalmic emulsion Commonly known as: RESTASIS Place 1 drop into both eyes 2 (two) times daily as needed (dry eyes).   diphenhydrAMINE 25 MG tablet Commonly known as: BENADRYL Take 25 mg by mouth every 6 (six) hours as needed for itching or allergies.   Ear Wax Removal Drops 6.5 % OTIC solution Generic drug: carbamide peroxide Place 5 drops into both ears every Sunday.   famotidine 40 MG tablet Commonly known as: PEPCID Take 40 mg by mouth every evening.   fenofibrate 54 MG tablet Take 1 tablet  (54 mg total) by mouth daily. What changed:   medication strength  how much to take  when to take this   finasteride 5 MG tablet Commonly known as: PROSCAR Take 1 tablet (5 mg total) by mouth daily.   fluocinonide 0.05 % external solution Commonly known as: LIDEX Apply 1 application topically 2 (two) times daily.   fluocinonide ointment 0.05 % Commonly known as: LIDEX Apply 1 application topically 2 (two) times daily as needed (pruritus). (apply to legs)   gabapentin 100 MG capsule Commonly known as: NEURONTIN Take 200 mg by mouth at bedtime. (take with 600mg  tablet to equal 800mg  total)   gabapentin 600 MG tablet Commonly known as: NEURONTIN Take 600 mg by mouth at bedtime. (take with 100mg  capsules to equal 800mg  total)   guaiFENesin 100 MG/5ML liquid Commonly known as: ROBITUSSIN Take 300 mg by mouth every 6 (six) hours as needed for cough.   guaiFENesin-dextromethorphan 100-10 MG/5ML syrup Commonly known as: ROBITUSSIN DM Take 5 mLs by mouth every 4 (four) hours as needed for cough.   hydrocortisone 1 % ointment Apply 1 application topically daily as needed for itching.   ibuprofen 400 MG tablet Commonly known as: ADVIL Take 400 mg by mouth 3 (three) times daily as needed for mild pain or moderate pain.   ipratropium-albuterol 0.5-2.5 (3) MG/3ML Soln Commonly known as: DUONEB Take 3 mLs by nebulization 2 (two) times daily.   levothyroxine 50 MCG tablet Commonly known as: SYNTHROID Take 50 mcg daily before breakfast by mouth.   loperamide 2 MG capsule Commonly known as: IMODIUM Take 4 mg by mouth as needed for diarrhea or loose stools ((max 8 doses in 24 hours)).   losartan 25 MG tablet Commonly known as: COZAAR Take 25 mg by mouth daily.   magnesium hydroxide 400 MG/5ML suspension Commonly known as: MILK OF MAGNESIA Take 30 mLs by mouth daily as needed for mild constipation or moderate constipation.   mirabegron ER 50 MG Tb24  tablet Commonly known as: MYRBETRIQ Take 50 mg by mouth daily.   montelukast 5 MG chewable tablet Commonly known as: SINGULAIR Chew 5 mg by mouth at bedtime.   Multi-Vitamins Tabs Take 1 tablet by mouth daily.   nortriptyline 75 MG capsule Commonly known as: PAMELOR Take 150 mg by mouth daily.   omeprazole 10 MG capsule Commonly known as: PRILOSEC Take 10 mg by mouth daily.   polyethylene glycol 17 g packet Commonly known as: MIRALAX / GLYCOLAX Take 17 g by mouth daily as needed for mild constipation or moderate constipation.   tamsulosin 0.4 MG Caps capsule Commonly known as: FLOMAX Take 0.4 mg by mouth at bedtime.   tiZANidine 2 MG tablet Commonly known as: ZANAFLEX Take 2 mg by mouth at bedtime.    Relevant Imaging Results:  Relevant Lab Results:  Additional Information    Chapman Fitch, RN

## 2020-11-01 NOTE — Progress Notes (Signed)
Dakota Harper  A and O x 4. VSS. Pt tolerating diet well. No complaints of pain or nausea. IV removed intact, prescriptions given. Pt voiced understanding of discharge instructions with no further questions. Pt discharged via wheelchair with axillary.    Allergies as of 11/01/2020      Reactions   Saw Palmetto Hives, Rash   "Berry form"   Other Other (See Comments), Rash   Sulfa Antibiotics Rash   Sulfacetamide Sodium Rash      Medication List    TAKE these medications   acetaminophen 500 MG tablet Commonly known as: TYLENOL Take 500 mg by mouth every 4 (four) hours as needed for moderate pain or mild pain.   acetaminophen 325 MG tablet Commonly known as: TYLENOL Take 650 mg by mouth every 4 (four) hours as needed for mild pain.   albuterol 108 (90 Base) MCG/ACT inhaler Commonly known as: VENTOLIN HFA Inhale 1 puff into the lungs 3 (three) times daily as needed for wheezing or shortness of breath.   alum & mag hydroxide-simeth 200-200-20 MG/5ML suspension Commonly known as: MAALOX/MYLANTA Take 30 mLs by mouth 4 (four) times daily as needed for indigestion or heartburn.   aspirin EC 81 MG tablet Take 81 mg by mouth at bedtime.   atorvastatin 10 MG tablet Commonly known as: LIPITOR Take 10 mg by mouth every evening.   barrier cream Crea Commonly known as: non-specified Apply 1 application topically as needed (post-toileting skin protection).   Breo Ellipta 200-25 MCG/INH Aepb Generic drug: fluticasone furoate-vilanterol Inhale 1 puff into the lungs daily.   chlorhexidine 0.12 % solution Commonly known as: PERIDEX Use as directed 15 mLs in the mouth or throat 2 (two) times daily as needed (oral discomfort).   cycloSPORINE 0.05 % ophthalmic emulsion Commonly known as: RESTASIS Place 1 drop into both eyes 2 (two) times daily as needed (dry eyes).   diphenhydrAMINE 25 MG tablet Commonly known as: BENADRYL Take 25 mg by mouth every 6 (six) hours as needed for  itching or allergies.   Ear Wax Removal Drops 6.5 % OTIC solution Generic drug: carbamide peroxide Place 5 drops into both ears every Sunday.   famotidine 40 MG tablet Commonly known as: PEPCID Take 40 mg by mouth every evening.   fenofibrate 54 MG tablet Take 1 tablet (54 mg total) by mouth daily. What changed:   medication strength  how much to take  when to take this   finasteride 5 MG tablet Commonly known as: PROSCAR Take 1 tablet (5 mg total) by mouth daily.   fluocinonide 0.05 % external solution Commonly known as: LIDEX Apply 1 application topically 2 (two) times daily.   fluocinonide ointment 0.05 % Commonly known as: LIDEX Apply 1 application topically 2 (two) times daily as needed (pruritus). (apply to legs)   gabapentin 100 MG capsule Commonly known as: NEURONTIN Take 200 mg by mouth at bedtime. (take with 600mg  tablet to equal 800mg  total)   gabapentin 600 MG tablet Commonly known as: NEURONTIN Take 600 mg by mouth at bedtime. (take with 100mg  capsules to equal 800mg  total)   guaiFENesin 100 MG/5ML liquid Commonly known as: ROBITUSSIN Take 300 mg by mouth every 6 (six) hours as needed for cough.   guaiFENesin-dextromethorphan 100-10 MG/5ML syrup Commonly known as: ROBITUSSIN DM Take 5 mLs by mouth every 4 (four) hours as needed for cough.   hydrocortisone 1 % ointment Apply 1 application topically daily as needed for itching.   ibuprofen 400 MG tablet  Commonly known as: ADVIL Take 400 mg by mouth 3 (three) times daily as needed for mild pain or moderate pain.   ipratropium-albuterol 0.5-2.5 (3) MG/3ML Soln Commonly known as: DUONEB Take 3 mLs by nebulization 2 (two) times daily.   levothyroxine 50 MCG tablet Commonly known as: SYNTHROID Take 50 mcg daily before breakfast by mouth.   loperamide 2 MG capsule Commonly known as: IMODIUM Take 4 mg by mouth as needed for diarrhea or loose stools ((max 8 doses in 24 hours)).   losartan 25 MG  tablet Commonly known as: COZAAR Take 25 mg by mouth daily.   magnesium hydroxide 400 MG/5ML suspension Commonly known as: MILK OF MAGNESIA Take 30 mLs by mouth daily as needed for mild constipation or moderate constipation.   mirabegron ER 50 MG Tb24 tablet Commonly known as: MYRBETRIQ Take 50 mg by mouth daily.   montelukast 5 MG chewable tablet Commonly known as: SINGULAIR Chew 5 mg by mouth at bedtime.   Multi-Vitamins Tabs Take 1 tablet by mouth daily.   nortriptyline 75 MG capsule Commonly known as: PAMELOR Take 150 mg by mouth daily.   omeprazole 10 MG capsule Commonly known as: PRILOSEC Take 10 mg by mouth daily.   polyethylene glycol 17 g packet Commonly known as: MIRALAX / GLYCOLAX Take 17 g by mouth daily as needed for mild constipation or moderate constipation.   tamsulosin 0.4 MG Caps capsule Commonly known as: FLOMAX Take 0.4 mg by mouth at bedtime.   tiZANidine 2 MG tablet Commonly known as: ZANAFLEX Take 2 mg by mouth at bedtime.       Vitals:   11/01/20 0840 11/01/20 1144  BP: 138/61 (!) 153/72  Pulse: 88 96  Resp: 16 16  Temp: 98.6 F (37 C) (!) 97.5 F (36.4 C)  SpO2: 96%     Dakota Harper

## 2020-11-01 NOTE — TOC Transition Note (Signed)
Transition of Care [facility]) - CM/SW Discharge Note   Patient Details  Name: Dakota Harper MRN: [id number] Date of Birth: Jul 13, 1946  [facility] of [facility] CM/SW Contact:  Chapman Fitch, RN Phone Number: 11/01/2020, 1:40 PM   Clinical Narrative:     Patient to discharge back to the [facility] today [facility] providing transportation between 1-2  Fl2 faxed to Wayne at the [place].  Hard copy of the Fl2 placed in discharge packet   Final next level of care: Assisted Living Barriers to Discharge: No Barriers Identified   Patient Goals and CMS Choice        Discharge Placement                       Discharge Plan and Services                                     Social Determinants of Health (SDOH) Interventions     Readmission Risk Interventions No flowsheet data found.

## 2020-11-11 ENCOUNTER — Inpatient Hospital Stay: Admit: 2020-11-11 | Payer: 59

## 2020-11-15 ENCOUNTER — Ambulatory Visit: Admit: 2020-11-15 | Payer: 59 | Admitting: Internal Medicine

## 2020-11-15 SURGERY — COLONOSCOPY WITH PROPOFOL
Anesthesia: General

## 2020-11-22 ENCOUNTER — Ambulatory Visit: Payer: 59 | Admitting: Dermatology

## 2020-11-23 ENCOUNTER — Ambulatory Visit: Payer: 59 | Admitting: Dermatology

## 2020-12-03 ENCOUNTER — Telehealth: Payer: Self-pay | Admitting: Urology

## 2020-12-03 NOTE — Telephone Encounter (Signed)
Mr. Bassford has an elevated PSA and needs to have an office visit at this time.

## 2021-01-07 ENCOUNTER — Telehealth: Payer: Self-pay | Admitting: Radiology

## 2021-01-07 NOTE — Telephone Encounter (Signed)
LMOM at [place] to return call. Patient needs an appointment for elevated PSA with Zara Council with PSA prior.

## 2021-01-10 ENCOUNTER — Other Ambulatory Visit: Payer: Self-pay

## 2021-01-10 DIAGNOSIS — N400 Enlarged prostate without lower urinary tract symptoms: Secondary | ICD-10-CM

## 2021-01-10 DIAGNOSIS — R972 Elevated prostate specific antigen [PSA]: Secondary | ICD-10-CM

## 2021-01-10 NOTE — Telephone Encounter (Signed)
Appointments made. Ann with [facility] of [place] aware.

## 2021-01-11 ENCOUNTER — Other Ambulatory Visit: Payer: 59

## 2021-01-11 ENCOUNTER — Encounter: Payer: Self-pay | Admitting: Urology

## 2021-01-20 ENCOUNTER — Ambulatory Visit: Payer: 59 | Admitting: Urology

## 2021-02-03 ENCOUNTER — Encounter: Payer: Self-pay | Admitting: Urology

## 2021-02-28 ENCOUNTER — Ambulatory Visit (INDEPENDENT_AMBULATORY_CARE_PROVIDER_SITE_OTHER): Payer: 59 | Admitting: Dermatology

## 2021-02-28 ENCOUNTER — Other Ambulatory Visit: Payer: Self-pay

## 2021-02-28 DIAGNOSIS — L820 Inflamed seborrheic keratosis: Secondary | ICD-10-CM | POA: Diagnosis not present

## 2021-02-28 DIAGNOSIS — D229 Melanocytic nevi, unspecified: Secondary | ICD-10-CM | POA: Diagnosis not present

## 2021-02-28 DIAGNOSIS — D225 Melanocytic nevi of trunk: Secondary | ICD-10-CM | POA: Diagnosis not present

## 2021-02-28 DIAGNOSIS — L853 Xerosis cutis: Secondary | ICD-10-CM

## 2021-02-28 DIAGNOSIS — D239 Other benign neoplasm of skin, unspecified: Secondary | ICD-10-CM

## 2021-02-28 DIAGNOSIS — L219 Seborrheic dermatitis, unspecified: Secondary | ICD-10-CM | POA: Diagnosis not present

## 2021-02-28 DIAGNOSIS — L84 Corns and callosities: Secondary | ICD-10-CM | POA: Diagnosis not present

## 2021-02-28 DIAGNOSIS — D485 Neoplasm of uncertain behavior of skin: Secondary | ICD-10-CM

## 2021-02-28 HISTORY — DX: Other benign neoplasm of skin, unspecified: D23.9

## 2021-02-28 MED ORDER — FLUOCINOLONE ACETONIDE BODY 0.01 % EX OIL: TOPICAL_OIL | CUTANEOUS | 3 refills | Status: DC

## 2021-02-28 MED ORDER — KETOCONAZOLE 2 % EX SHAM: MEDICATED_SHAMPOO | CUTANEOUS | 3 refills | Status: DC

## 2021-02-28 NOTE — Progress Notes (Signed)
New Patient Visit  Subjective  Dakota Harper is a 75 y.o. male who presents for the following: Growths (Patient here today to have irritating growths on face checked/treated. He hits areas with razor.). He also has an itchy scalp, which he uses Lidex solution and Selsun Blue shampoo. He has an itchy spot on his right calf that he has used Lidex ointment for, but no improvement. He would also like his back checked today. No history of skin cancer.    The following portions of the chart were reviewed this encounter and updated as appropriate:       Review of Systems:  No other skin or systemic complaints except as noted in HPI or Assessment and Plan.  Objective  Well appearing patient in no apparent distress; mood and affect are within normal limits.  A focused examination was performed including face, scalp, legs, back. Relevant physical exam findings are noted in the Assessment and Plan.  Objective  Right Lateral Calf x 1, L parietal scalp x 3, L jaw x 2: 4.17mm pink firm scaly papule of the R lat calf; Erythematous keratotic or waxy stuck-on papules.   Objective  Right medial great toe: Hyperkeratotic plaque.   Objective  Right lateral calf: 4.64mm medium dark brown macule  Back, abdomen: Regular brown macules and medium dark brown macules.  Images              Objective  Scalp: Pink scaliness.  Objective  Right scapula: 4.41mm medium dark brown macule      Assessment & Plan   Xerosis - diffuse xerotic patches of the left arm - recommend gentle, hydrating skin care - gentle skin care handout given  Melanocytic Nevi - Tan-brown and/or pink-flesh-colored symmetric macules and papules - Benign appearing on exam today - Observation - Call clinic for new or changing moles - Recommend daily use of broad spectrum spf 30+ sunscreen to sun-exposed areas.    Inflamed seborrheic keratosis Right Lateral Calf x 1, L parietal scalp x 3, L jaw x 2  vs  Irritated Dermatofibroma (right lateral calf)  Prior to procedure, discussed risks of blister formation, small wound, skin dyspigmentation, or rare scar following cryotherapy.   Discussed biopsy R lateral calf if no improvement after cryotherapy.   Destruction of lesion - Right Lateral Calf x 1, L parietal scalp x 3, L jaw x 2  Destruction method: cryotherapy   Informed consent: discussed and consent obtained   Lesion destroyed using liquid nitrogen: Yes   Region frozen until ice ball extended beyond lesion: Yes   Outcome: patient tolerated procedure well with no complications   Post-procedure details: wound care instructions given    Pre-ulcerative corn or callous Right medial great toe  Recommend Mediplast patches, change every 3 days until improved. Recommend patient see podiatrist for further treatment.  Nevus (2) Right lateral calf; Back, abdomen  Benign-appearing.  Observation.  Call clinic for new or changing moles.  Recommend daily use of broad spectrum spf 30+ sunscreen to sun-exposed areas.   Photos today of back and abdomen.  Seborrheic dermatitis Scalp  Chronic condition- not at goal  Start 2% ketoconazole shampoo Massage into scalp and let sit several minutes before rinsing dsp 174mL 3Rf. Start Derma-Smoothe F/S oil Apply to scalp qd/bid prn itch dsp 151mL 3Rf.  Seborrheic Dermatitis  -  is a chronic persistent rash characterized by pinkness and scaling most commonly of the mid face but also can occur on the scalp (dandruff), ears; mid  chest and mid back. It tends to be exacerbated by stress and cooler weather.  People who have neurologic disease may experience new onset or exacerbation of existing seborrheic dermatitis.  The condition is not curable but treatable and can be controlled.   ketoconazole (NIZORAL) 2 % shampoo - Scalp  Fluocinolone Acetonide Body 0.01 % OIL - Scalp  Neoplasm of uncertain behavior of skin Right scapula  Epidermal / dermal  shaving  Lesion diameter (cm):  0.6 Informed consent: discussed and consent obtained   Patient was prepped and draped in usual sterile fashion: Area prepped with alcohol. Anesthesia: the lesion was anesthetized in a standard fashion   Anesthetic:  1% lidocaine w/ epinephrine 1-100,000 buffered w/ 8.4% NaHCO3 Instrument used: flexible razor blade   Hemostasis achieved with: pressure, aluminum chloride and electrodesiccation   Outcome: patient tolerated procedure well   Post-procedure details: wound care instructions given   Post-procedure details comment:  Ointment and small bandage applied  Specimen 1 - Surgical pathology Differential Diagnosis: Nevus r/o Dysplasia Check Margins: Yes 4.43mm medium dark brown macule  Return in about 3 months (around 05/30/2021) for UBSE, recheck ISKs/moles and R lat calf.   IJamesetta Orleans, CMA, am acting as scribe for Brendolyn Patty, MD .  Documentation: I have reviewed the above documentation for accuracy and completeness, and I agree with the above.  Brendolyn Patty MD

## 2021-02-28 NOTE — Patient Instructions (Addendum)
Wound Care Instructions  1. Cleanse wound gently with soap and water once a day then pat dry with clean gauze. Apply a thing coat of Petrolatum (petroleum jelly, "Vaseline") over the wound (unless you have an allergy to this). We recommend that you use a new, sterile tube of Vaseline. Do not pick or remove scabs. Do not remove the yellow or white "healing tissue" from the base of the wound.  2. Cover the wound with fresh, clean, nonstick gauze and secure with paper tape. You may use Band-Aids in place of gauze and tape if the would is small enough, but would recommend trimming much of the tape off as there is often too much. Sometimes Band-Aids can irritate the skin.  3. You should call the office for your biopsy report after 1 week if you have not already been contacted.  4. If you experience any problems, such as abnormal amounts of bleeding, swelling, significant bruising, significant pain, or evidence of infection, please call the office immediately.  5. FOR ADULT SURGERY PATIENTS: If you need something for pain relief you may take 1 extra strength Tylenol (acetaminophen) AND 2 Ibuprofen (200mg  each) together every 4 hours as needed for pain. (do not take these if you are allergic to them or if you have a reason you should not take them.) Typically, you may only need pain medication for 1 to 3 days.       Cryotherapy Aftercare  . Wash gently with soap and water everyday.   Marland Kitchen Apply Vaseline and Band-Aid daily until healed.   Curad Mediplast Patches - Apply to callous on right great toe, change every 3 days or as needed. For further treatment, patient should see podiatrist.    Dry Skin Care  What causes dry skin?  Dry skin is common and results from inadequate moisture in the outer skin layers. Dry skin usually results from the excessive loss of moisture from the skin surface. This occurs due to two major factors: 1. Normally the skin's oil glands deposit a layer of oil on the skin's  surface. This layer of oil prevents the loss of moisture from the skin. Exposure to soaps, cleaners, solvents, and disinfectants removes this oily film, allowing water to escape. 2. Water loss from the skin increases when the humidity is low. During winter months we spend a lot of time indoors where the air is heated. Heated air has very low humidity. This also contributes to dry skin.  A tendency for dry skin may accompany such disorders as eczema. Also, as people age, the number of functioning oil glands decreases, and the tendency toward dry skin can be a sensation of skin tightness when emerging from the shower.  How do I manage dry skin?  1. Humidify your environment. This can be accomplished by using a humidifier in your bedroom at night during winter months. 2. Bathing can actually put moisture back into your skin if done right. Take the following steps while bathing to sooth dry skin:  Avoid hot water, which only dries the skin and makes itching worse. Use warm water.  Avoid washcloths or extensive rubbing or scrubbing.  Use mild soaps like unscented Dove, Oil of Olay, Cetaphil, Basis, or CeraVe.  If you take baths rather than showers, rinse off soap residue with clean water before getting out of tub.  Once out of the shower/tub, pat dry gently with a soft towel. Leave your skin damp.  While still damp, apply any medicated ointment/cream you were prescribed  to the affected areas. After you apply your medicated ointment/cream, then apply your moisturizer to your whole body.This is the most important step in dry skin care. If this is omitted, your skin will continue to be dry.  The choice of moisturizer is also very important. In general, lotion will not provider enough moisture to severely dry skin because it is water based. You should use an ointment or cream. Moisturizers should also be unscented. Good choices include Vaseline (plain petrolatum), Aquaphor, Cetaphil, CeraVe, Vanicream,  DML Forte, Aveeno moisture, or Eucerin Cream.  Bath oils can be helpful, but do not replace the application of moisturizer after the bath. In addition, they make the tub slippery causing an increased risk for falls. Therefore, we do not recommend their use.   If you have any questions or concerns for your doctor, please call our main line at [phone] and press option 4 to reach your doctor's medical assistant. If no one answers, please leave a voicemail as directed and we will return your call as soon as possible. Messages left after 4 pm will be answered the following business day.   You may also send [place] a message via [place]. We typically respond to MyChart messages within 1-2 business days.  For prescription refills, please ask your pharmacy to contact our office. Our fax number is [phone].  If you have an urgent issue when the clinic is closed that cannot wait until the next business day, you can page your doctor at the number below.    Please note that while we do our best to be available for urgent issues outside of office hours, we are not available 24/7.   If you have an urgent issue and are unable to reach [place], you may choose to seek medical care at your doctor's office, retail clinic, urgent care center, or emergency room.  If you have a medical emergency, please immediately call 911 or go to the emergency department.  Pager Numbers  - Dr. Nehemiah Massed: [phone]  - Dr. Laurence Ferrari: [phone]  - Dr. Nicole Kindred: [phone]  In the event of inclement weather, please call our main line at [phone] for an update on the status of any delays or closures.  Dermatology Medication Tips: Please keep the boxes that topical medications come in in order to help keep track of the instructions about where and how to use these. Pharmacies typically print the medication instructions only on the boxes and not directly on the medication tubes.   If your medication is too expensive,  please contact our office at [phone] option 4 or send [place] a message through [place].   We are unable to tell what your co-pay for medications will be in advance as this is different depending on your insurance coverage. However, we may be able to find a substitute medication at lower cost or fill out paperwork to get insurance to cover a needed medication.   If a prior authorization is required to get your medication covered by your insurance company, please allow [place] 1-2 business days to complete this process.  Drug prices often vary depending on where the prescription is filled and some pharmacies may offer cheaper prices.  The website www.goodrx.com contains coupons for medications through different pharmacies. The prices here do not account for what the cost may be with help from insurance (it may be cheaper with your insurance), but the website can give you the price if you did not use any insurance.  - You can print the associated  coupon and take it with your prescription to the pharmacy.  - You may also stop by our office during regular business hours and pick up a GoodRx coupon card.  - If you need your prescription sent electronically to a different pharmacy, notify our office through [facility] or by phone at [phone] option 4.

## 2021-03-03 ENCOUNTER — Telehealth: Payer: Self-pay

## 2021-03-03 NOTE — Telephone Encounter (Signed)
Left message at [facility] on director's VM for patient to call back.

## 2021-03-03 NOTE — Telephone Encounter (Signed)
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

## 2021-03-08 ENCOUNTER — Telehealth: Payer: Self-pay

## 2021-03-08 NOTE — Telephone Encounter (Signed)
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 

## 2021-03-08 NOTE — Telephone Encounter (Signed)
Left message on the director at [facility] to call or have patient call for biopsy results.

## 2021-03-10 ENCOUNTER — Telehealth: Payer: Self-pay

## 2021-03-10 NOTE — Telephone Encounter (Signed)
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 

## 2021-03-10 NOTE — Telephone Encounter (Signed)
Patient advised of BX results.  °

## 2021-03-19 ENCOUNTER — Emergency Department
Admission: EM | Admit: 2021-03-19 | Discharge: 2021-03-20 | Disposition: A | Discharge status: Home / Self Care | Payer: 59 | Attending: Emergency Medicine | Admitting: Emergency Medicine

## 2021-03-19 ENCOUNTER — Emergency Department: Payer: 59

## 2021-03-19 ENCOUNTER — Encounter: Payer: Self-pay | Admitting: Intensive Care

## 2021-03-19 ENCOUNTER — Other Ambulatory Visit: Payer: Self-pay

## 2021-03-19 DIAGNOSIS — E039 Hypothyroidism, unspecified: Secondary | ICD-10-CM | POA: Insufficient documentation

## 2021-03-19 DIAGNOSIS — Z7951 Long term (current) use of inhaled steroids: Secondary | ICD-10-CM | POA: Insufficient documentation

## 2021-03-19 DIAGNOSIS — J449 Chronic obstructive pulmonary disease, unspecified: Secondary | ICD-10-CM | POA: Diagnosis not present

## 2021-03-19 DIAGNOSIS — Z79899 Other long term (current) drug therapy: Secondary | ICD-10-CM | POA: Diagnosis not present

## 2021-03-19 DIAGNOSIS — Z7982 Long term (current) use of aspirin: Secondary | ICD-10-CM | POA: Insufficient documentation

## 2021-03-19 DIAGNOSIS — K5900 Constipation, unspecified: Secondary | ICD-10-CM | POA: Diagnosis not present

## 2021-03-19 DIAGNOSIS — J45909 Unspecified asthma, uncomplicated: Secondary | ICD-10-CM | POA: Diagnosis not present

## 2021-03-19 DIAGNOSIS — R109 Unspecified abdominal pain: Secondary | ICD-10-CM | POA: Diagnosis present

## 2021-03-19 DIAGNOSIS — I129 Hypertensive chronic kidney disease with stage 1 through stage 4 chronic kidney disease, or unspecified chronic kidney disease: Secondary | ICD-10-CM | POA: Diagnosis not present

## 2021-03-19 DIAGNOSIS — N183 Chronic kidney disease, stage 3 unspecified: Secondary | ICD-10-CM | POA: Diagnosis not present

## 2021-03-19 HISTORY — DX: Chronic obstructive pulmonary disease, unspecified: J44.9

## 2021-03-19 LAB — CBC
HCT: 44 % (ref 39.0–52.0)
Hemoglobin: 14.8 g/dL (ref 13.0–17.0)
MCH: 30.5 pg (ref 26.0–34.0)
MCHC: 33.6 g/dL (ref 30.0–36.0)
MCV: 90.7 fL (ref 80.0–100.0)
Platelets: 277 10*3/uL (ref 150–400)
RBC: 4.85 MIL/uL (ref 4.22–5.81)
RDW: 13.5 % (ref 11.5–15.5)
WBC: 13.2 10*3/uL — ABNORMAL HIGH (ref 4.0–10.5)
nRBC: 0 % (ref 0.0–0.2)

## 2021-03-19 LAB — COMPREHENSIVE METABOLIC PANEL
ALT: 19 U/L (ref 0–44)
AST: 25 U/L (ref 15–41)
Albumin: 4 g/dL (ref 3.5–5.0)
Alkaline Phosphatase: 86 U/L (ref 38–126)
Anion gap: 9 (ref 5–15)
BUN: 30 mg/dL — ABNORMAL HIGH (ref 8–23)
CO2: 24 mmol/L (ref 22–32)
Calcium: 9.2 mg/dL (ref 8.9–10.3)
Chloride: 99 mmol/L (ref 98–111)
Creatinine, Ser: 1.74 mg/dL — ABNORMAL HIGH (ref 0.61–1.24)
GFR, Estimated: 40 mL/min — ABNORMAL LOW (ref >60)
Glucose, Bld: 188 mg/dL — ABNORMAL HIGH (ref 70–99)
Potassium: 4.7 mmol/L (ref 3.5–5.1)
Sodium: 132 mmol/L — ABNORMAL LOW (ref 135–145)
Total Bilirubin: 0.9 mg/dL (ref 0.3–1.2)
Total Protein: 7.6 g/dL (ref 6.5–8.1)

## 2021-03-19 LAB — LIPASE, BLOOD: Lipase: 32 U/L (ref 11–51)

## 2021-03-19 MED ORDER — FLEET ENEMA 7-19 GM/118ML RE ENEM
1.0000 | ENEMA | Freq: Once | RECTAL | Status: AC
  Administered 2021-03-20: 1 via RECTAL

## 2021-03-19 NOTE — ED Notes (Signed)
Attempted soap suds enema per order, patient unable to tolerate. EDP informed.

## 2021-03-19 NOTE — ED Provider Notes (Signed)
[facility] Emergency Department Provider Note   ____________________________________________   I have reviewed the triage vital signs and the nursing notes.   HISTORY  Chief Complaint Abdominal Pain and Constipation   History limited by: Not Limited   HPI Dakota Harper is a 75 y.o. male who presents to the emergency department today because of concern for abdominal pain and constipation. Patient states he is prone to constipation. He also states that he has been not as vigilant with his diet as he should be. Had some junk food and french fries the other day, also is not drinking as much water as he should. He states that he does try to disimpact himself from time to time. Came in today because he started developing some abdominal discomfort. States this is not typical for him with his abdominal pain. Located primarily on the right side of his abdomen. He denies any nausea or vomiting.   Records reviewed. Per medical record review patient has a history of COPD, asthma.   Past Medical History:  Diagnosis Date  . Asthma   . BPH (benign prostatic hyperplasia)   . COPD (chronic obstructive pulmonary disease) ([place])   . 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 Ear pain   . Hypercholesteremia   . Hypertension   . Neuropathy   . Sleep disorder     Patient Active Problem List   Diagnosis Date Noted  . Sepsis due to pneumonia ([place]) 10/28/2020  . Severe sepsis ([place]) 10/27/2020  . AKI (acute kidney injury) ([place]) 10/27/2020  . COPD with chronic bronchitis ([place]) 10/27/2020  . Septic shock ([place]) 10/27/2020  . Ingrown left big toenail 10/25/2020  . Ingrown nail of great toe of right foot 10/25/2020  . Pneumonia 11/26/2019  . Community acquired bacterial pneumonia 11/25/2019  . Sepsis ([place]) 11/25/2019  . Weakness 11/25/2019  . CKD (chronic kidney disease), stage III ([place]) 11/25/2019  . Hypothyroidism 11/25/2019  . BPH (benign prostatic  hyperplasia) 11/25/2019  . Headache 11/25/2019  . Tremors of nervous system 11/25/2019  . Thrombocytosis 11/25/2019  . Elevated bilirubin 11/25/2019    Past Surgical History:  Procedure Laterality Date  . CHOLECYSTECTOMY    . HERNIA REPAIR      Prior to Admission medications   Medication Sig Start Date End Date Taking? Authorizing Provider  acetaminophen (TYLENOL) 325 MG tablet Take 650 mg by mouth every 4 (four) hours as needed for mild pain.    [provider]  acetaminophen (TYLENOL) 500 MG tablet Take 500 mg by mouth every 4 (four) hours as needed for moderate pain or mild pain.    [provider]  albuterol (PROVENTIL HFA;VENTOLIN HFA) 108 (90 BASE) MCG/ACT inhaler Inhale 1 puff into the lungs 3 (three) times daily as needed for wheezing or shortness of breath.    [provider]  alum & mag hydroxide-simeth (MAALOX/MYLANTA) 200-200-20 MG/5ML suspension Take 30 mLs by mouth 4 (four) times daily as needed for indigestion or heartburn.    [provider]  aspirin EC 81 MG tablet Take 81 mg by mouth at bedtime.     [provider]  atorvastatin (LIPITOR) 10 MG tablet Take 10 mg by mouth every evening.    [provider]  barrier cream (NON-SPECIFIED) CREA Apply 1 application topically as needed (post-toileting skin protection).    [provider]  carbamide peroxide (EAR WAX REMOVAL DROPS) 6.5 % OTIC solution Place 5 drops into both ears every Sunday.  [provider]  chlorhexidine (PERIDEX) 0.12 % solution Use as directed 15 mLs in the mouth or throat 2 (two) times daily as needed (oral discomfort).    [provider]  cycloSPORINE (RESTASIS) 0.05 % ophthalmic emulsion Place 1 drop into both eyes 2 (two) times daily as needed (dry eyes).    [provider]  diphenhydrAMINE (BENADRYL) 25 MG tablet Take 25 mg by mouth every 6 (six) hours as needed for itching or allergies.    [provider]  famotidine (PEPCID) 40 MG tablet Take 40 mg by mouth every evening.    [provider]  fenofibrate 54 MG tablet Take 1 tablet (54 mg total) by mouth daily. 11/01/20   Sidney Ace, MD  finasteride (PROSCAR) 5 MG tablet Take 1 tablet (5 mg total) by mouth daily. 01/22/20   Zara Council A, PA-C  Fluocinolone Acetonide Body 0.01 % OIL Apply to scalp 1-2 times a day and leave on. 02/28/21   Brendolyn Patty, MD  fluocinonide (LIDEX) 0.05 % external solution Apply 1 application topically 2 (two) times daily.    [provider]  fluocinonide ointment (LIDEX) 2.67 % Apply 1 application topically 2 (two) times daily as needed (pruritus). (apply to legs)    [provider]  fluticasone furoate-vilanterol (BREO ELLIPTA) 200-25 MCG/INH AEPB Inhale 1 puff into the lungs daily. 08/10/20   [provider]  gabapentin (NEURONTIN) 100 MG capsule Take 200 mg by mouth at bedtime. (take with 600mg  tablet to equal 800mg  total)    [provider]  gabapentin (NEURONTIN) 600 MG tablet Take 600 mg by mouth at bedtime. (take with 100mg  capsules to equal 800mg  total)    [provider]  guaiFENesin (ROBITUSSIN) 100 MG/5ML liquid Take 300 mg by mouth every 6 (six) hours as needed for cough.    [provider]  guaiFENesin-dextromethorphan (ROBITUSSIN DM) 100-10 MG/5ML syrup Take 5 mLs by mouth every 4 (four) hours as needed for cough. 11/01/20   Ralene Muskrat B, MD  hydrocortisone 1 % ointment Apply 1 application topically daily as needed for itching.    [provider]  ibuprofen (ADVIL) 400 MG tablet Take 400 mg by mouth 3 (three) times daily as needed for mild pain or moderate pain.    [provider]  ipratropium-albuterol (DUONEB) 0.5-2.5 (3) MG/3ML SOLN Take 3 mLs by nebulization 2 (two) times daily. 07/21/20   [provider]  ketoconazole (NIZORAL) 2 % shampoo Massage into scalp daily and let sit several minutes  before rinsing. 02/28/21   Brendolyn Patty, MD  levothyroxine (SYNTHROID, LEVOTHROID) 50 MCG tablet Take 50 mcg daily before breakfast by mouth.    [provider]  loperamide (IMODIUM) 2 MG capsule Take 4 mg by mouth as needed for diarrhea or loose stools ((max 8 doses in 24 hours)).    [provider]  losartan (COZAAR) 25 MG tablet Take 25 mg by mouth daily.    [provider]  magnesium hydroxide (MILK OF MAGNESIA) 400 MG/5ML suspension Take 30 mLs by mouth daily as needed for mild constipation or moderate constipation.    [provider]  mirabegron ER (MYRBETRIQ) 50 MG TB24 tablet Take 50 mg by mouth daily.    [provider]  montelukast (SINGULAIR) 5 MG chewable tablet Chew 5 mg by mouth at bedtime.    [provider]  Multiple Vitamin (MULTI-VITAMINS) TABS Take 1 tablet by mouth daily.     [provider]  nortriptyline (  PAMELOR) 75 MG capsule Take 150 mg by mouth daily.     [provider]  omeprazole (PRILOSEC) 10 MG capsule Take 10 mg by mouth daily.    [provider]  polyethylene glycol (MIRALAX / GLYCOLAX) 17 g packet Take 17 g by mouth daily as needed for mild constipation or moderate constipation.    [provider]  tamsulosin (FLOMAX) 0.4 MG CAPS capsule Take 0.4 mg by mouth at bedtime.    [provider]  tiZANidine (ZANAFLEX) 2 MG tablet Take 2 mg by mouth at bedtime.    [provider]    Allergies Saw palmetto, Other, Sulfa antibiotics, and Sulfacetamide sodium  Family History  Problem Relation Age of Onset  . Prostate cancer Neg Hx   . Bladder Cancer Neg Hx   . Kidney cancer Neg Hx     Social History Social History   Tobacco Use  . Smoking status: Never Smoker  . Smokeless tobacco: Never Used  Vaping Use  . Vaping Use: Never used  Substance Use Topics  . Alcohol use: No  . Drug use: No    Review of Systems Constitutional: No fever/chills Eyes: No  visual changes. ENT: No sore throat. Cardiovascular: Denies chest pain. Respiratory: Denies shortness of breath. Gastrointestinal: Positive for constipation and abdominal pain.  Genitourinary: Negative for dysuria. Musculoskeletal: Negative for back pain. Skin: Negative for rash. Neurological: Negative for headaches, focal weakness or numbness.  ____________________________________________   PHYSICAL EXAM:  VITAL SIGNS: ED Triage Vitals  Enc Vitals Group     BP 03/19/21 1828 (!) 148/84     Pulse Rate 03/19/21 1828 89     Resp 03/19/21 1828 18     Temp 03/19/21 1828 98.1 F (36.7 C)     Temp Source 03/19/21 1828 Oral     SpO2 03/19/21 1828 100 %     Weight 03/19/21 1830 214 lb (97.1 kg)     Height 03/19/21 1830 5\' 8"  (1.727 m)     Head Circumference --      Peak Flow --      Pain Score 03/19/21 1830 8   Constitutional: Alert and oriented.  Eyes: Conjunctivae are normal.  ENT      Head: Normocephalic and atraumatic.      Nose: No congestion/rhinnorhea.      Mouth/Throat: Mucous membranes are moist.      Neck: No stridor. Hematological/Lymphatic/Immunilogical: No cervical lymphadenopathy. Cardiovascular: Normal rate, regular rhythm.  No murmurs, rubs, or gallops.  Respiratory: Normal respiratory effort without tachypnea nor retractions. Breath sounds are clear and equal bilaterally. No wheezes/rales/rhonchi. Gastrointestinal: Soft and non tender. No rebound. No guarding.  Genitourinary: Deferred Musculoskeletal: Normal range of motion in all extremities. No lower extremity edema. Neurologic:  Normal speech and language. No gross focal neurologic deficits are appreciated.  Skin:  Skin is warm, dry and intact. No rash noted. Psychiatric: Mood and affect are normal. Speech and behavior are normal. Patient exhibits appropriate insight and judgment.  ____________________________________________    LABS (pertinent positives/negatives)  CMP na 132, k 4.7, glu 188, cr  1.74 Lipase 32 CBC wbc 13.2, hgb 14.8, plt 277  ____________________________________________   EKG  None  ____________________________________________    RADIOLOGY  Abd x-ray Air fluid levels concerning for enteritis/ileus thought less likely to represent obstruction  ____________________________________________   PROCEDURES  Procedures  ____________________________________________   INITIAL IMPRESSION / ASSESSMENT AND PLAN / ED COURSE  Pertinent labs & imaging results that were available during my care  of the patient were reviewed by me and considered in my medical decision making (see chart for details).   Patient presented to the emergency department today because of concerns for constipation.  On exam patient's abdomen is benign.  He is however complaining of some discomfort.  I did discuss potentially getting a CT scan although patient states he would not be able to tolerate CT scan.  Did get x-ray which did show some air-fluid levels.  This time think more likely related to enteritis or ileus rather than obstruction.  Certainly clinically patient does not appear to have a small bowel obstruction.  No vomiting.  No abdominal tenderness.  Did attempt soapsuds enema however patient was unable to tolerate.  He wanted to then try a Fleet enema.  Will try Fleet enema. ____________________________________________   FINAL CLINICAL IMPRESSION(S) / ED DIAGNOSES  Final diagnoses:  Constipation, unspecified constipation type     Note: This dictation was prepared with Dragon dictation. Any transcriptional errors that result from this process are unintentional     Nance Pear, MD 03/19/21 2357

## 2021-03-19 NOTE — ED Notes (Signed)
Attempted enema, patient states he needed to use the bathroom prior. Patient states "it's hard and I am pulling it out". Patient educated on enema and purpose.

## 2021-03-19 NOTE — ED Notes (Signed)
Patient using restroom at time of VS check.

## 2021-03-19 NOTE — ED Notes (Signed)
Informed patient of soap suds enema, patient states he needs to use the restroom prior to receiving.

## 2021-03-19 NOTE — ED Triage Notes (Signed)
Pt in via EMS from [facility]. EMS reports per facility, pt has been constipated for 1 day. Last BM was yesterday. Pt has abd pain and anal pain. Pt was given miralax, dulcolax and MOM. 143/64, HR 97, 98% RA

## 2021-03-19 NOTE — ED Notes (Signed)
Patient pacing in room, NAD noted. Patient states that he was able to "put his finger in and get some poop out", patient has had "a few small BM's since arriving" and is requesting an enema or suppository.

## 2021-03-20 DIAGNOSIS — K5900 Constipation, unspecified: Secondary | ICD-10-CM | POA: Diagnosis not present

## 2021-03-20 MED ORDER — LACTULOSE 10 GM/15ML PO SOLN
30.0000 g | Freq: Once | ORAL | Status: AC
  Administered 2021-03-20: 30 g via ORAL
  Filled 2021-03-20: qty 60

## 2021-03-20 MED ORDER — LACTULOSE 10 GM/15ML PO SOLN: 20.0000 g | Freq: Every day | ORAL | 0 refills | Status: DC | PRN

## 2021-03-20 NOTE — ED Notes (Signed)
[facility] called and report given. Secretary informed of need for transport.

## 2021-03-20 NOTE — ED Notes (Signed)
Patient states he is able to have a large BM, but would like more enema. This RN educated patient. Patient states "I need more because I am still unable to urinate". Patient educated. EDP informed.

## 2021-03-20 NOTE — Discharge Instructions (Addendum)
You may take Lactulose as needed for bowel movements.  Take a daily stool softener.  Return to the ER for worsening symptoms, persistent vomiting, difficulty breathing or other concerns

## 2021-03-20 NOTE — ED Notes (Signed)
Patient insisted to receive fleet enema while standing. This RN advised patient to lie down. Patient refused, attempted to administer fleets while patient standing. Able to administer half per patient's request.  Patient on toilet ATT.

## 2021-03-21 ENCOUNTER — Telehealth: Payer: Self-pay | Admitting: Podiatry

## 2021-03-21 NOTE — Telephone Encounter (Signed)
Called pt lvm to reschedule 5/19 appt

## 2021-03-24 ENCOUNTER — Ambulatory Visit: Payer: 59 | Admitting: Podiatry

## 2021-04-05 ENCOUNTER — Encounter: Payer: Self-pay | Admitting: Internal Medicine

## 2021-04-06 ENCOUNTER — Encounter: Payer: Self-pay | Admitting: Certified Registered Nurse Anesthetist

## 2021-04-06 ENCOUNTER — Encounter
Admission: RE | Disposition: A | Discharge status: Home / Self Care | Payer: Self-pay | Source: Ambulatory Visit | Attending: Internal Medicine

## 2021-04-06 ENCOUNTER — Ambulatory Visit
Admission: RE | Admit: 2021-04-06 | Discharge: 2021-04-06 | Disposition: A | Discharge status: Home / Self Care | Payer: 59 | Source: Ambulatory Visit | Attending: Internal Medicine | Admitting: Internal Medicine

## 2021-04-06 HISTORY — DX: Sleep apnea, unspecified: G47.30

## 2021-04-06 HISTORY — DX: Benign neoplasm of colon, unspecified: D12.6

## 2021-04-06 HISTORY — DX: Gastro-esophageal reflux disease without esophagitis: K21.9

## 2021-04-06 HISTORY — DX: Hypothyroidism, unspecified: E03.9

## 2021-04-06 HISTORY — DX: Headache, unspecified: R51.9

## 2021-04-06 SURGERY — COLONOSCOPY WITH PROPOFOL
Anesthesia: General

## 2021-04-06 MED ORDER — SODIUM CHLORIDE 0.9 % IV SOLN: INTRAVENOUS | Status: DC

## 2021-04-11 ENCOUNTER — Other Ambulatory Visit: Payer: Self-pay

## 2021-04-11 ENCOUNTER — Ambulatory Visit (INDEPENDENT_AMBULATORY_CARE_PROVIDER_SITE_OTHER): Payer: 59 | Admitting: Podiatry

## 2021-04-11 ENCOUNTER — Encounter: Payer: Self-pay | Admitting: Podiatry

## 2021-04-11 DIAGNOSIS — N1832 Chronic kidney disease, stage 3b: Secondary | ICD-10-CM

## 2021-04-11 DIAGNOSIS — L600 Ingrowing nail: Secondary | ICD-10-CM

## 2021-04-11 NOTE — Progress Notes (Addendum)
This patient presents to the office stating he has pain for the last 2 weeks due to ingrowing nails both big toes.   He previously had nail surgery on both great toes.  He presents to the office for evaluation and treatment.  He denies any drainage or infection.   Vascular  Dorsalis pedis and posterior tibial pulses are weakly  palpable  B/L.  Capillary return  WNL.  Temperature gradient is  WNL.  Skin turgor  WNL  Sensorium  Senn Weinstein monofilament wire  WNL. Normal tactile sensation.  Nail Exam  Patient has normal nails with no evidence of bacterial or fungal infection. Marked incurvation medial and lateral borders both hallux toes.  No signs of redness or infection noted.  Orthopedic  Exam  Muscle tone and muscle strength  WNL.  No limitations of motion feet  B/L.  No crepitus or joint effusion noted.  Foot type is unremarkable and digits show no abnormalities.  Bony prominences are unremarkable.  Skin  No open lesions.  Normal skin texture and turgor  .  Asymptomatic pinch callus right hallux.  Incurvated/Ingrown toenails Hallux  B/L.  Debride hallux toenails with nail nipper followed by dremel tool. Patient had cut on his right hand which bled after I left the room. I gave him a band-aid.   Gardiner Barefoot DPM

## 2021-06-09 ENCOUNTER — Encounter: Payer: Self-pay | Admitting: Podiatry

## 2021-06-09 ENCOUNTER — Ambulatory Visit (INDEPENDENT_AMBULATORY_CARE_PROVIDER_SITE_OTHER): Payer: 59 | Admitting: Podiatry

## 2021-06-09 ENCOUNTER — Other Ambulatory Visit: Payer: Self-pay

## 2021-06-09 DIAGNOSIS — N183 Chronic kidney disease, stage 3 unspecified: Secondary | ICD-10-CM

## 2021-06-09 DIAGNOSIS — R251 Tremor, unspecified: Secondary | ICD-10-CM | POA: Diagnosis not present

## 2021-06-09 DIAGNOSIS — L84 Corns and callosities: Secondary | ICD-10-CM

## 2021-06-09 DIAGNOSIS — N179 Acute kidney failure, unspecified: Secondary | ICD-10-CM | POA: Diagnosis not present

## 2021-06-09 NOTE — Progress Notes (Signed)
This patient presents to the office stating he has pain  due to the callus on his right big toe.       He presents to the office for evaluation and treatment.  He denies any drainage or infection. He presents for preventative foot care services.  Vascular  Dorsalis pedis and posterior tibial pulses are weakly  palpable  B/L.  Capillary return  WNL.  Temperature gradient is  WNL.  Skin turgor  WNL  Sensorium  Senn Weinstein monofilament wire  WNL. Normal tactile sensation.  Nail Exam  Patient has normal nails with no evidence of bacterial or fungal infection. Marked incurvation medial and lateral borders both hallux toes.  No signs of redness or infection noted.  Orthopedic  Exam  Muscle tone and muscle strength  WNL.  No limitations of motion feet  B/L.  No crepitus or joint effusion noted.  Foot type is unremarkable and digits show no abnormalities.  HAV  B/L.   Skin  No open lesions.  Normal skin texture and turgor  .  Symptomatic pinch callus right hallux.  Pinch callus right hallux.  Debride hallux callus with # 15 blade.  Followed by dremel usage.  RTC 3 months    Gardiner Barefoot DPM

## 2021-06-22 ENCOUNTER — Other Ambulatory Visit: Payer: Self-pay

## 2021-06-22 ENCOUNTER — Ambulatory Visit (INDEPENDENT_AMBULATORY_CARE_PROVIDER_SITE_OTHER): Payer: 59 | Admitting: Gastroenterology

## 2021-06-22 DIAGNOSIS — K5900 Constipation, unspecified: Secondary | ICD-10-CM | POA: Diagnosis not present

## 2021-06-22 DIAGNOSIS — Z1211 Encounter for screening for malignant neoplasm of colon: Secondary | ICD-10-CM | POA: Diagnosis not present

## 2021-06-22 MED ORDER — PEG 3350-KCL-NA BICARB-NACL 420 G PO SOLR: ORAL | 0 refills | Status: DC

## 2021-06-22 NOTE — Progress Notes (Signed)
Jonathon Bellows MD, MRCP(U.K) [address]  [place]  [place], [place] [address]  Main: [phone]  Fax: [phone]   Gastroenterology Consultation  Referring Provider:     [name], NP Primary Care Physician:  [name], NP Primary Gastroenterologist:  Dr. Jonathon Bellows  Reason for Consultation:     Constipation and hemorroids        HPI:   Dakota Harper is a 75 y.o. y/o male referred for consultation & management  by [name], NP.    He says that he used to have constipation but started a high-fiber diet and is doing much better now.  He was concerned that he will have diarrhea if he takes a bowel prep and that explained to him that was the purpose of the bowel prep to clean out the colon He used to be patient of [facility] and seen last at their office on 01/27/2021. Per their last note has a history of adenomatous polyps in 2015 . Has suffered from constipation and unable to obtain a clean colon for procedure   Review of epic shows that the procedure was scheduled and canceled on multiple occasions.  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 Authorizing Provider  acetaminophen (TYLENOL) 325 MG tablet Take 650 mg by mouth every 4 (four) hours as needed for mild pain.    [provider]  acetaminophen (TYLENOL) 500 MG tablet Take  500 mg by mouth every 4 (four) hours as needed for moderate pain or mild pain.    [provider]  albuterol (PROVENTIL HFA;VENTOLIN HFA) 108 (90 BASE) MCG/ACT inhaler Inhale 1 puff into the lungs 3 (three) times daily as needed for wheezing or shortness of breath.    [provider]  alum & mag hydroxide-simeth (MAALOX/MYLANTA) 200-200-20 MG/5ML suspension Take 30 mLs by mouth 4 (four) times daily as needed for indigestion or heartburn.    [provider]  amLODipine (NORVASC) 5 MG tablet Take 1 tablet by mouth daily. 11/02/15   [provider]  aspirin EC 81 MG tablet Take 81 mg by mouth at bedtime.     [provider]  atorvastatin (LIPITOR) 10 MG tablet Take 10 mg by mouth every evening.    [provider]  barrier cream (NON-SPECIFIED) CREA Apply 1 application topically as needed (post-toileting skin protection).    [provider]  bisacodyl (DULCOLAX) 5 MG EC tablet Take as directed for colonic prep 01/27/21   [provider]  carbamide peroxide (EAR WAX REMOVAL DROPS) 6.5 % OTIC solution Place 5 drops into both ears every Sunday.    [provider]  chlorhexidine (PERIDEX) 0.12 % solution Use as directed 15 mLs in the  mouth or throat 2 (two) times daily as needed (oral discomfort).    [provider]  cycloSPORINE (RESTASIS) 0.05 % ophthalmic emulsion Place 1 drop into both eyes 2 (two) times daily as needed (dry eyes).    [provider]  diclofenac Sodium (VOLTAREN) 1 % GEL Apply topically. 03/31/21   [provider]  diphenhydrAMINE (BENADRYL) 25 MG tablet Take 25 mg by mouth every 6 (six) hours as needed for itching or allergies.    [provider]  famotidine (PEPCID) 40 MG tablet Take 40 mg by mouth every evening.    [provider]  fenofibrate 54 MG tablet Take 1 tablet (54 mg total) by mouth daily. 11/01/20   Sidney Ace, MD  finasteride (PROSCAR) 5 MG  tablet Take 1 tablet (5 mg total) by mouth daily. 01/22/20   Zara Council A, PA-C  Fluocinolone Acetonide Body 0.01 % OIL Apply to scalp 1-2 times a day and leave on. 02/28/21   Brendolyn Patty, MD  fluocinonide (LIDEX) 0.05 % external solution Apply 1 application topically 2 (two) times daily.    [provider]  fluocinonide ointment (LIDEX) [id number] % Apply 1 application topically 2 (two) times daily as needed (pruritus). (apply to legs)    [provider]  fluticasone furoate-vilanterol (BREO ELLIPTA) 200-25 MCG/INH AEPB Inhale 1 puff into the lungs daily. 08/10/20   [provider]  gabapentin (NEURONTIN) 100 MG capsule Take 200 mg by mouth at bedtime. (take with '600mg'$  tablet to equal '800mg'$  total)    [provider]  gabapentin (NEURONTIN) 600 MG tablet Take 600 mg by mouth at bedtime. (take with '100mg'$  capsules to equal '800mg'$  total)    [provider]  guaiFENesin (ROBITUSSIN) 100 MG/5ML liquid Take 300 mg by mouth every 6 (six) hours as needed for cough.    [provider]  guaiFENesin-dextromethorphan (ROBITUSSIN DM) 100-10 MG/5ML syrup Take 5 mLs by mouth every 4 (four) hours as needed for cough. 11/01/20   Sidney Ace, MD  hydrocortisone (ANUSOL-HC) 2.5 % rectal cream SMARTSIG:1 Topical Daily PRN 03/31/21   [provider]  hydrocortisone 1 % ointment Apply 1 application topically daily as needed for itching.    [provider]  ibuprofen (ADVIL) 400 MG tablet Take 400 mg by mouth 3 (three) times daily as needed for mild pain or moderate pain.    [provider]  ipratropium-albuterol (DUONEB) 0.5-2.5 (3) MG/3ML SOLN Take 3 mLs by nebulization 2 (two) times daily. 07/21/20   [provider]  ketoconazole (NIZORAL) 2 % shampoo Massage into scalp daily and let sit several minutes before rinsing. 02/28/21   Brendolyn Patty, MD  lactulose (CHRONULAC) 10 GM/15ML solution Take 30 mLs (20 g total) by mouth daily  as needed for mild constipation. 03/20/21   Paulette Blanch, MD  levothyroxine (SYNTHROID, LEVOTHROID) 50 MCG tablet Take 50 mcg daily before breakfast by mouth.    [provider]  loperamide (IMODIUM) 2 MG capsule Take 4 mg by mouth as needed for diarrhea or loose stools ((max 8 doses in 24 hours)).    [provider]  losartan (COZAAR) 25 MG tablet Take 25 mg by mouth daily.    [provider]  magnesium hydroxide (MILK OF MAGNESIA) 400 MG/5ML suspension Take 30 mLs by mouth daily as needed for mild constipation or moderate constipation.    [provider]  mirabegron ER (MYRBETRIQ) 50 MG TB24 tablet Take 50 mg by mouth daily.  [provider]  montelukast (SINGULAIR) 5 MG chewable tablet Chew 5 mg by mouth at bedtime.    [provider]  Multiple Vitamin (MULTI-VITAMINS) TABS Take 1 tablet by mouth daily.     [provider]  nortriptyline (PAMELOR) 75 MG capsule Take 150 mg by mouth daily.     [provider]  nystatin-triamcinolone (MYCOLOG II) cream SMARTSIG:1 Topical Daily PRN 03/17/21   [provider]  omeprazole (PRILOSEC) 10 MG capsule Take 10 mg by mouth daily.    [provider]  polyethylene glycol (MIRALAX / GLYCOLAX) 17 g packet Take 17 g by mouth daily as needed for mild constipation or moderate constipation.    [provider]  polyethylene glycol powder (GLYCOLAX/MIRALAX) 17 GM/SCOOP powder  03/31/21   [provider]  tamsulosin (FLOMAX) 0.4 MG CAPS capsule Take 0.4 mg by mouth at bedtime.    [provider]  tiZANidine (ZANAFLEX) 2 MG tablet Take 2 mg by mouth at bedtime.    [provider]    Family History  Problem Relation Age of Onset   Prostate cancer Neg Hx    Bladder Cancer Neg Hx    Kidney cancer Neg Hx      Social History   Tobacco Use   Smoking status: Never   Smokeless tobacco: Never  Vaping Use   Vaping Use: Never used  Substance  Use Topics   Alcohol use: No   Drug use: No    Allergies as of 06/22/2021 - Review Complete 06/09/2021  Allergen Reaction Noted   Saw palmetto Hives and Rash 02/19/2013   Other Other (See Comments) and Rash 05/25/2011   Sulfa antibiotics Rash 02/11/2013   Sulfacetamide sodium Rash 09/10/2014    Review of Systems:    All systems reviewed and negative except where noted in HPI.   Physical Exam:  There were no vitals taken for this visit. No LMP for male patient. Psych:  Alert and cooperative. Normal mood and affect. General:   Alert,  Well-developed, well-nourished, pleasant and cooperative in NAD Head:  Normocephalic and atraumatic. Eyes:  Sclera clear, no icterus.   Conjunctiva pink. Abdomen:  Normal bowel sounds.  No bruits.  Soft, non-tender and non-distended without masses, hepatosplenomegaly or hernias noted.  No guarding or rebound tenderness.    Neurologic:  Alert and oriented x3;  grossly normal neurologically. Psych:  Alert and cooperative. Normal mood and affect.  Imaging Studies: No results found.  Assessment and Plan:   JAYTHON STOFFLET is a 75 y.o. y/o male has been referred for constipation and a surveillance colonoscopy due to prior history of colon polyps. Prior.  Prior attempts of colonoscopy with [facility] was not possible as he was not prepped adequately.  It appears he was not aware of the need to clean out completely as well as what would be expected.  He was scared that he was having diarrhea during the bowel prep procedure.  I explained to him that diarrhea was expected and in fact we want him to clean out completely to the point that his stool looks like the color of urine for [place] to have a good look inside.  Plan  2 day prep with 2 gallons of Golytely - drink second bottle still stool output is transparent.  We will schedule him on a Monday so if he is not clean we can give him additional bowel prep to get him the following day for the procedure Chronic  constipation he is  doing well on a high-fiber diet continue to do so.  Patient information will be provided for high-fiber diet.  If he continues to have constipation commence on MiraLAX 1 capful daily if that does not work to come back and see me  I have discussed alternative options, risks & benefits,  which include, but are not limited to, bleeding, infection, perforation,respiratory complication & drug reaction.  The patient agrees with this plan & written consent will be obtained.  ]   Follow up in as needed  Dr Jonathon Bellows MD,MRCP(U.K)

## 2021-06-22 NOTE — Addendum Note (Signed)
Addended by: Wayna Chalet on: 06/22/2021 11:23 AM   Modules accepted: Orders

## 2021-06-22 NOTE — Patient Instructions (Signed)
High-Fiber Eating Plan °Fiber, also called dietary fiber, is a type of carbohydrate. It is found foods such as fruits, vegetables, whole grains, and beans. A high-fiber diet can have many health benefits. Your health care provider may recommend a high-fiber diet to help: °Prevent constipation. Fiber can make your bowel movements more regular. °Lower your cholesterol. °Relieve the following conditions: °Inflammation of veins in the anus (hemorrhoids). °Inflammation of specific areas of the digestive tract (uncomplicated diverticulosis). °A problem of the large intestine, also called the colon, that sometimes causes pain and diarrhea (irritable bowel syndrome, or IBS). °Prevent overeating as part of a weight-loss plan. °Prevent heart disease, type 2 diabetes, and certain cancers. °What are tips for following this plan? °Reading food labels ° °Check the nutrition facts label on food products for the amount of dietary fiber. Choose foods that have 5 grams of fiber or more per serving. °The goals for recommended daily fiber intake include: °Men (age 50 or younger): 34-38 g. °Men (over age 50): 28-34 g. °Women (age 50 or younger): 25-28 g. °Women (over age 50): 22-25 g. °Your daily fiber goal is _____________ g. °Shopping °Choose whole fruits and vegetables instead of processed forms, such as apple juice or applesauce. °Choose a wide variety of high-fiber foods such as avocados, lentils, oats, and kidney beans. °Read the nutrition facts label of the foods you choose. Be aware of foods with added fiber. These foods often have high sugar and sodium amounts per serving. °Cooking °Use whole-grain flour for baking and cooking. °Cook with brown rice instead of white rice. °Meal planning °Start the day with a breakfast that is high in fiber, such as a cereal that contains 5 g of fiber or more per serving. °Eat breads and cereals that are made with whole-grain flour instead of refined flour or white flour. °Eat brown rice, bulgur  wheat, or millet instead of white rice. °Use beans in place of meat in soups, salads, and pasta dishes. °Be sure that half of the grains you eat each day are whole grains. °General information °You can get the recommended daily intake of dietary fiber by: °Eating a variety of fruits, vegetables, grains, nuts, and beans. °Taking a fiber supplement if you are not able to take in enough fiber in your diet. It is better to get fiber through food than from a supplement. °Gradually increase how much fiber you consume. If you increase your intake of dietary fiber too quickly, you may have bloating, cramping, or gas. °Drink plenty of water to help you digest fiber. °Choose high-fiber snacks, such as berries, raw vegetables, nuts, and popcorn. °What foods should I eat? °Fruits °Berries. Pears. Apples. Oranges. Avocado. Prunes and raisins. Dried figs. °Vegetables °Sweet potatoes. Spinach. Kale. Artichokes. Cabbage. Broccoli. Cauliflower. Green peas. Carrots. Squash. °Grains °Whole-grain breads. Multigrain cereal. Oats and oatmeal. Brown rice. Barley. Bulgur wheat. Millet. Quinoa. Bran muffins. Popcorn. Rye wafer crackers. °Meats and other proteins °Navy beans, kidney beans, and pinto beans. Soybeans. Split peas. Lentils. Nuts and seeds. °Dairy °Fiber-fortified yogurt. °Beverages °Fiber-fortified soy milk. Fiber-fortified orange juice. °Other foods °Fiber bars. °The items listed above may not be a complete list of recommended foods and beverages. Contact a dietitian for more information. °What foods should I avoid? °Fruits °Fruit juice. Cooked, strained fruit. °Vegetables °Fried potatoes. Canned vegetables. Well-cooked vegetables. °Grains °White bread. Pasta made with refined flour. White rice. °Meats and other proteins °Fatty cuts of meat. Fried chicken or fried fish. °Dairy °Milk. Yogurt. Cream cheese. Sour cream. °Fats and   oils °Butters. °Beverages °Soft drinks. °Other foods °Cakes and pastries. °The items listed above may  not be a complete list of foods and beverages to avoid. Talk with your dietitian about what choices are best for you. °Summary °Fiber is a type of carbohydrate. It is found in foods such as fruits, vegetables, whole grains, and beans. °A high-fiber diet has many benefits. It can help to prevent constipation, lower blood cholesterol, aid weight loss, and reduce your risk of heart disease, diabetes, and certain cancers. °Increase your intake of fiber gradually. Increasing fiber too quickly may cause cramping, bloating, and gas. Drink plenty of water while you increase the amount of fiber you consume. °The best sources of fiber include whole fruits and vegetables, whole grains, nuts, seeds, and beans. °This information is not intended to replace advice given to you by your health care provider. Make sure you discuss any questions you have with your health care provider. °Document Revised: 02/26/2020 Document Reviewed: 02/26/2020 °Elsevier Patient Education © 2022 [employer] Inc. ° °

## 2021-06-28 ENCOUNTER — Ambulatory Visit: Payer: 59 | Admitting: Dermatology

## 2021-07-04 ENCOUNTER — Other Ambulatory Visit: Payer: Self-pay

## 2021-07-04 ENCOUNTER — Ambulatory Visit (INDEPENDENT_AMBULATORY_CARE_PROVIDER_SITE_OTHER): Payer: 59 | Admitting: Dermatology

## 2021-07-04 DIAGNOSIS — Z86018 Personal history of other benign neoplasm: Secondary | ICD-10-CM

## 2021-07-04 DIAGNOSIS — L821 Other seborrheic keratosis: Secondary | ICD-10-CM

## 2021-07-04 DIAGNOSIS — D489 Neoplasm of uncertain behavior, unspecified: Secondary | ICD-10-CM

## 2021-07-04 DIAGNOSIS — L814 Other melanin hyperpigmentation: Secondary | ICD-10-CM

## 2021-07-04 DIAGNOSIS — L578 Other skin changes due to chronic exposure to nonionizing radiation: Secondary | ICD-10-CM | POA: Diagnosis not present

## 2021-07-04 DIAGNOSIS — C44311 Basal cell carcinoma of skin of nose: Secondary | ICD-10-CM | POA: Diagnosis not present

## 2021-07-04 DIAGNOSIS — L219 Seborrheic dermatitis, unspecified: Secondary | ICD-10-CM | POA: Diagnosis not present

## 2021-07-04 DIAGNOSIS — L738 Other specified follicular disorders: Secondary | ICD-10-CM | POA: Diagnosis not present

## 2021-07-04 DIAGNOSIS — Z1283 Encounter for screening for malignant neoplasm of skin: Secondary | ICD-10-CM

## 2021-07-04 DIAGNOSIS — L820 Inflamed seborrheic keratosis: Secondary | ICD-10-CM | POA: Diagnosis not present

## 2021-07-04 DIAGNOSIS — C4491 Basal cell carcinoma of skin, unspecified: Secondary | ICD-10-CM

## 2021-07-04 DIAGNOSIS — D225 Melanocytic nevi of trunk: Secondary | ICD-10-CM

## 2021-07-04 DIAGNOSIS — D229 Melanocytic nevi, unspecified: Secondary | ICD-10-CM

## 2021-07-04 DIAGNOSIS — D485 Neoplasm of uncertain behavior of skin: Secondary | ICD-10-CM

## 2021-07-04 DIAGNOSIS — D1800 Hemangioma unspecified site: Secondary | ICD-10-CM

## 2021-07-04 HISTORY — DX: Basal cell carcinoma of skin, unspecified: C44.91

## 2021-07-04 NOTE — Progress Notes (Signed)
Follow-Up Visit   Subjective  Dakota Harper is a 75 y.o. male who presents for the following: Follow-up (Patient here today for 3 month follow up for ISK's and seb derm. Patient currently using ketoconazole shampoo 3 times a week and fluocinolone oil as needed. He is still having some itch. Patient also here to check moles at upper body.).    The following portions of the chart were reviewed this encounter and updated as appropriate:       Review of Systems:  No other skin or systemic complaints except as noted in HPI or Assessment and Plan.  Objective  Well appearing patient in no apparent distress; mood and affect are within normal limits.  All skin waist up examined.  Right scapula inferior 0.6 x 0.2cm medium dark brown macule with irregular pigment with hypopigmentation superior       Scalp, ears Mild erythema and scale at crown  left nasal ala 43m flesh papule with central crust     Left medial upper eyelid Erythematous keratotic or waxy stuck-on papule  Other previously treated ISKs have cleared  forehead Small yellow papules with a central dell.    Assessment & Plan  Neoplasm of uncertain behavior Right scapula inferior  Epidermal / dermal shaving  Lesion diameter (cm):  0.7 Informed consent: discussed and consent obtained   Patient was prepped and draped in usual sterile fashion: Area prepped with alcohol. Anesthesia: the lesion was anesthetized in a standard fashion   Anesthetic:  1% lidocaine w/ epinephrine 1-100,000 buffered w/ 8.4% NaHCO3 Instrument used: flexible razor blade   Hemostasis achieved with: pressure, aluminum chloride and electrodesiccation   Outcome: patient tolerated procedure well   Post-procedure details: wound care instructions given   Post-procedure details comment:  Ointment and small bandage applied.   Specimen 1 - Surgical pathology Differential Diagnosis: Nevus r/o dysplasia  Check Margins: No 0.6 x 0.2cm  medium dark brown macule with irregular pigment with hypopigmentation superior  Seborrheic dermatitis Scalp, ears  Improving but not at goal  Continue ketoconazole 2% shampoo 3 times weekly, massage into scalp and let sit for 5-10 minutes before washing out. Continue fluocinolone oil 1-2 times daily as needed for itch to scalp and ears Discussed adding stronger topical steroid solution/foam, pt declines for now  Seborrheic Dermatitis  -  is a chronic persistent rash characterized by pinkness and scaling most commonly of the mid face but also can occur on the scalp (dandruff), ears; mid chest and mid back. It tends to be exacerbated by stress and cooler weather.  People who have neurologic disease may experience new onset or exacerbation of existing seborrheic dermatitis.  The condition is not curable but treatable and can be controlled.    Related Medications ketoconazole (NIZORAL) 2 % shampoo Massage into scalp daily and let sit several minutes before rinsing.  Fluocinolone Acetonide Body 0.01 % OIL Apply to scalp 1-2 times a day and leave on.  Neoplasm of uncertain behavior of skin left nasal ala  Skin / nail biopsy Type of biopsy: tangential   Informed consent: discussed and consent obtained   Patient was prepped and draped in usual sterile fashion: Area prepped with alcohol. Anesthesia: the lesion was anesthetized in a standard fashion   Anesthetic:  1% lidocaine w/ epinephrine 1-100,000 buffered w/ 8.4% NaHCO3 Instrument used: flexible razor blade   Hemostasis achieved with: pressure, aluminum chloride and electrodesiccation   Outcome: patient tolerated procedure well   Post-procedure details: wound care instructions given  Post-procedure details comment:  Ointment and small bandage applied  Specimen 2 - Surgical pathology Differential Diagnosis: sebaceous hyperplasia r/o BCC  Check Margins: No 35m flesh papule with central crust  Seb Hyperplasia r/o BCC- discussed  Mohs vs EDC if positive- Patient prefers EDC  Inflamed seborrheic keratosis Left medial upper eyelid  Itchy  Destruction of lesion - Left medial upper eyelid  Destruction method: cryotherapy   Informed consent: discussed and consent obtained   Lesion destroyed using liquid nitrogen: Yes   Region frozen until ice ball extended beyond lesion: Yes   Outcome: patient tolerated procedure well with no complications   Post-procedure details: wound care instructions given    Sebaceous hyperplasia forehead  Benign, observe.     History of Dysplastic Nevi - No evidence of recurrence today at right scapula - Recommend regular full body skin exams - Recommend daily broad spectrum sunscreen SPF 30+ to sun-exposed areas, reapply every 2 hours as needed.  - Call if any new or changing lesions are noted between office visits  Lentigines - Scattered tan macules - Due to sun exposure - Benign-appering, observe - Recommend daily broad spectrum sunscreen SPF 30+ to sun-exposed areas, reapply every 2 hours as needed. - Call for any changes  Seborrheic Keratoses - Stuck-on, waxy, tan-brown papules and/or plaques  - Benign-appearing - Discussed benign etiology and prognosis. - Observe - Call for any changes  Melanocytic Nevi - Tan-brown and/or pink-flesh-colored symmetric macules and papules - Benign appearing on exam today, photos compared from previous visit, no changes - Observation - Call clinic for new or changing moles - Recommend daily use of broad spectrum spf 30+ sunscreen to sun-exposed areas.   Hemangiomas - Red papules - Discussed benign nature - Observe - Call for any changes  Actinic Damage - Chronic condition, secondary to cumulative UV/sun exposure - diffuse scaly erythematous macules with underlying dyspigmentation - Recommend daily broad spectrum sunscreen SPF 30+ to sun-exposed areas, reapply every 2 hours as needed.  - Staying in the shade or wearing long  sleeves, sun glasses (UVA+UVB protection) and wide brim hats (4-inch brim around the entire circumference of the hat) are also recommended for sun protection.  - Call for new or changing lesions.  Skin cancer screening performed today.  Return in about 1 year (around 07/04/2022) for TBSE, sooner pending bx.  IGraciella Belton RMA, am acting as scribe for TBrendolyn Patty MD .  Documentation: I have reviewed the above documentation for accuracy and completeness, and I agree with the above.  TBrendolyn PattyMD

## 2021-07-04 NOTE — Patient Instructions (Addendum)
For scalp - Continue ketoconazole 2% shampoo 3 times weekly, massage into scalp and let sit for 5-10 minutes before washing out. Continue fluocinolone oil 1-2 times daily as needed to scalp and ears for itch.   Cryotherapy Aftercare  Wash gently with soap and water everyday.   Apply Vaseline and Band-Aid daily until healed.    Wound Care Instructions  Cleanse wound gently with soap and water once a day then pat dry with clean gauze. Apply a thing coat of Petrolatum (petroleum jelly, "Vaseline") over the wound (unless you have an allergy to this). We recommend that you use a new, sterile tube of Vaseline. Do not pick or remove scabs. Do not remove the yellow or white "healing tissue" from the base of the wound.  Cover the wound with fresh, clean, nonstick gauze and secure with paper tape. You may use Band-Aids in place of gauze and tape if the would is small enough, but would recommend trimming much of the tape off as there is often too much. Sometimes Band-Aids can irritate the skin.  You should call the office for your biopsy report after 1 week if you have not already been contacted.  If you experience any problems, such as abnormal amounts of bleeding, swelling, significant bruising, significant pain, or evidence of infection, please call the office immediately.  FOR ADULT SURGERY PATIENTS: If you need something for pain relief you may take 1 extra strength Tylenol (acetaminophen) AND 2 Ibuprofen ('200mg'$  each) together every 4 hours as needed for pain. (do not take these if you are allergic to them or if you have a reason you should not take them.) Typically, you may only need pain medication for 1 to 3 days.   If you have any questions or concerns for your doctor, please call our main line at [phone] and press option 4 to reach your doctor's medical assistant. If no one answers, please leave a voicemail as directed and we will return your call as soon as possible. Messages left after 4  pm will be answered the following business day.   You may also send [place] a message via [place]. We typically respond to MyChart messages within 1-2 business days.  For prescription refills, please ask your pharmacy to contact our office. Our fax number is [phone].  If you have an urgent issue when the clinic is closed that cannot wait until the next business day, you can page your doctor at the number below.    Please note that while we do our best to be available for urgent issues outside of office hours, we are not available 24/7.   If you have an urgent issue and are unable to reach [place], you may choose to seek medical care at your doctor's office, retail clinic, urgent care center, or emergency room.  If you have a medical emergency, please immediately call 911 or go to the emergency department.  Pager Numbers  - Dr. Nehemiah Massed: [phone]  - Dr. Laurence Ferrari: [phone]  - Dr. Nicole Kindred: [phone]  In the event of inclement weather, please call our main line at [phone] for an update on the status of any delays or closures.  Dermatology Medication Tips: Please keep the boxes that topical medications come in in order to help keep track of the instructions about where and how to use these. Pharmacies typically print the medication instructions only on the boxes and not directly on the medication tubes.   If your medication is too expensive, please contact our office  at [phone] option 4 or send [place] a message through [place].   We are unable to tell what your co-pay for medications will be in advance as this is different depending on your insurance coverage. However, we may be able to find a substitute medication at lower cost or fill out paperwork to get insurance to cover a needed medication.   If a prior authorization is required to get your medication covered by your insurance company, please allow [place] 1-2 business days to complete this process.  Drug prices often vary  depending on where the prescription is filled and some pharmacies may offer cheaper prices.  The website www.goodrx.com contains coupons for medications through different pharmacies. The prices here do not account for what the cost may be with help from insurance (it may be cheaper with your insurance), but the website can give you the price if you did not use any insurance.  - You can print the associated coupon and take it with your prescription to the pharmacy.  - You may also stop by our office during regular business hours and pick up a GoodRx coupon card.  - If you need your prescription sent electronically to a different pharmacy, notify our office through [facility] or by phone at [phone] option 4.

## 2021-07-12 ENCOUNTER — Telehealth: Payer: Self-pay

## 2021-07-12 NOTE — Telephone Encounter (Signed)
-----   Message from Brendolyn Patty, MD sent at 07/12/2021 10:09 AM EDT ----- 1. Skin , right scapula inferior RECURRENT MELANOCYTIC NEVUS, LIMITED MARGINS FREE 2. Skin , left nasal ala BASAL CELL CARCINOMA, NODULAR PATTERN, PERIPHERAL AND DEEP MARGINS INVOLVED  1. Benign mole 2. BCC skin cancer, discussed Mohs vrs EDC during office visit, pt prefers EDC, please confirm - please call patient

## 2021-07-12 NOTE — Telephone Encounter (Signed)
Left message for patient to call office for bx results.

## 2021-07-13 ENCOUNTER — Telehealth: Payer: Self-pay

## 2021-07-13 NOTE — Telephone Encounter (Signed)
Spoke with pt and informed him of results. Pt still prefers EDC treatment. Scheduled EDC. He had no concerns.

## 2021-07-13 NOTE — Telephone Encounter (Signed)
-----   Message from Brendolyn Patty, MD sent at 07/12/2021 10:09 AM EDT ----- 1. Skin , right scapula inferior RECURRENT MELANOCYTIC NEVUS, LIMITED MARGINS FREE 2. Skin , left nasal ala BASAL CELL CARCINOMA, NODULAR PATTERN, PERIPHERAL AND DEEP MARGINS INVOLVED  1. Benign mole 2. BCC skin cancer, discussed Mohs vrs EDC during office visit, pt prefers EDC, please confirm - please call patient

## 2021-07-20 ENCOUNTER — Telehealth: Payer: Self-pay

## 2021-07-20 DIAGNOSIS — C44311 Basal cell carcinoma of skin of nose: Secondary | ICD-10-CM

## 2021-07-20 NOTE — Telephone Encounter (Signed)
Pt changed his mind on treatment for the [facility] and now would like to go to the skin surgery center for [facility].

## 2021-08-01 ENCOUNTER — Encounter
Admission: RE | Disposition: A | Discharge status: Home / Self Care | Payer: Self-pay | Source: Home / Self Care | Attending: Gastroenterology

## 2021-08-01 ENCOUNTER — Encounter: Payer: Self-pay | Admitting: Gastroenterology

## 2021-08-01 ENCOUNTER — Ambulatory Visit: Payer: 59 | Admitting: Registered Nurse

## 2021-08-01 ENCOUNTER — Ambulatory Visit
Admission: RE | Admit: 2021-08-01 | Discharge: 2021-08-01 | Disposition: A | Discharge status: Home / Self Care | Payer: 59 | Attending: Gastroenterology | Admitting: Gastroenterology

## 2021-08-01 DIAGNOSIS — Z7951 Long term (current) use of inhaled steroids: Secondary | ICD-10-CM | POA: Diagnosis not present

## 2021-08-01 DIAGNOSIS — Z79899 Other long term (current) drug therapy: Secondary | ICD-10-CM | POA: Diagnosis not present

## 2021-08-01 DIAGNOSIS — E039 Hypothyroidism, unspecified: Secondary | ICD-10-CM | POA: Insufficient documentation

## 2021-08-01 DIAGNOSIS — D124 Benign neoplasm of descending colon: Secondary | ICD-10-CM | POA: Diagnosis not present

## 2021-08-01 DIAGNOSIS — Z9049 Acquired absence of other specified parts of digestive tract: Secondary | ICD-10-CM | POA: Insufficient documentation

## 2021-08-01 DIAGNOSIS — Z888 Allergy status to other drugs, medicaments and biological substances status: Secondary | ICD-10-CM | POA: Insufficient documentation

## 2021-08-01 DIAGNOSIS — K219 Gastro-esophageal reflux disease without esophagitis: Secondary | ICD-10-CM | POA: Diagnosis not present

## 2021-08-01 DIAGNOSIS — D120 Benign neoplasm of cecum: Secondary | ICD-10-CM | POA: Insufficient documentation

## 2021-08-01 DIAGNOSIS — Z1211 Encounter for screening for malignant neoplasm of colon: Secondary | ICD-10-CM | POA: Diagnosis present

## 2021-08-01 DIAGNOSIS — I10 Essential (primary) hypertension: Secondary | ICD-10-CM | POA: Diagnosis not present

## 2021-08-01 DIAGNOSIS — J449 Chronic obstructive pulmonary disease, unspecified: Secondary | ICD-10-CM | POA: Insufficient documentation

## 2021-08-01 DIAGNOSIS — Z882 Allergy status to sulfonamides status: Secondary | ICD-10-CM | POA: Insufficient documentation

## 2021-08-01 DIAGNOSIS — E7800 Pure hypercholesterolemia, unspecified: Secondary | ICD-10-CM | POA: Insufficient documentation

## 2021-08-01 DIAGNOSIS — K640 First degree hemorrhoids: Secondary | ICD-10-CM | POA: Insufficient documentation

## 2021-08-01 DIAGNOSIS — K635 Polyp of colon: Secondary | ICD-10-CM | POA: Diagnosis not present

## 2021-08-01 DIAGNOSIS — G4730 Sleep apnea, unspecified: Secondary | ICD-10-CM | POA: Diagnosis not present

## 2021-08-01 DIAGNOSIS — Z7982 Long term (current) use of aspirin: Secondary | ICD-10-CM | POA: Diagnosis not present

## 2021-08-01 DIAGNOSIS — N400 Enlarged prostate without lower urinary tract symptoms: Secondary | ICD-10-CM | POA: Insufficient documentation

## 2021-08-01 DIAGNOSIS — Z85828 Personal history of other malignant neoplasm of skin: Secondary | ICD-10-CM | POA: Diagnosis not present

## 2021-08-01 DIAGNOSIS — K5900 Constipation, unspecified: Secondary | ICD-10-CM

## 2021-08-01 HISTORY — PX: COLONOSCOPY WITH PROPOFOL: SHX5780

## 2021-08-01 SURGERY — COLONOSCOPY WITH PROPOFOL
Anesthesia: General

## 2021-08-01 MED ORDER — PROPOFOL 500 MG/50ML IV EMUL
INTRAVENOUS | Status: DC | PRN
  Administered 2021-08-01: 150 ug/kg/min via INTRAVENOUS

## 2021-08-01 MED ORDER — EPHEDRINE SULFATE 50 MG/ML IJ SOLN
INTRAMUSCULAR | Status: DC | PRN
  Administered 2021-08-01: 10 mg via INTRAVENOUS
  Administered 2021-08-01: 5 mg via INTRAVENOUS
  Administered 2021-08-01: 10 mg via INTRAVENOUS

## 2021-08-01 MED ORDER — LIDOCAINE HCL (CARDIAC) PF 100 MG/5ML IV SOSY
PREFILLED_SYRINGE | INTRAVENOUS | Status: DC | PRN
  Administered 2021-08-01: 80 mg via INTRAVENOUS

## 2021-08-01 MED ORDER — PHENYLEPHRINE HCL (PRESSORS) 10 MG/ML IV SOLN
INTRAVENOUS | Status: DC | PRN
  Administered 2021-08-01 (×7): 100 ug via INTRAVENOUS

## 2021-08-01 MED ORDER — SODIUM CHLORIDE 0.9 % IV SOLN: INTRAVENOUS | Status: DC

## 2021-08-01 MED ORDER — PROPOFOL 10 MG/ML IV BOLUS
INTRAVENOUS | Status: DC | PRN
  Administered 2021-08-01: 80 mg via INTRAVENOUS

## 2021-08-01 NOTE — Transfer of Care (Signed)
Immediate Anesthesia Transfer of Care Note  Patient: Dakota Harper  Procedure(s) Performed: COLONOSCOPY WITH PROPOFOL  Patient Location: Endoscopy Unit  Anesthesia Type:General  Level of Consciousness: drowsy  Airway & Oxygen Therapy: Patient Spontanous Breathing  Post-op Assessment: Report given to RN and Post -op Vital signs reviewed and stable  Post vital signs: Reviewed and stable  Last Vitals:  Vitals Value Taken Time  BP 99/48 08/01/21 0946  Temp 35.6 C 08/01/21 0946  Pulse 71 08/01/21 0947  Resp 12 08/01/21 0947  SpO2 100 % 08/01/21 0947  Vitals shown include unvalidated device data.  Last Pain:  Vitals:   08/01/21 0946  TempSrc: Tympanic  PainSc: Asleep         Complications: No notable events documented.

## 2021-08-01 NOTE — Op Note (Signed)
[facility] Gastroenterology Patient Name: Dakota Harper Procedure Date: 08/01/2021 9:01 AM MRN: [id number] Account #: [id number] Date of Birth: [birth year] Admit Type: Outpatient Age: 75 Room: [facility] ENDO ROOM 4 Gender: Male Note Status: Finalized Instrument Name: Jasper Riling [id number] Procedure:             Colonoscopy Indications:           Surveillance: Personal history of adenomatous polyps                         on last colonoscopy > 5 years ago Providers:             Jonathon Bellows MD, MD Referring MD:          No Local Md, MD (Referring MD) Medicines:             Monitored Anesthesia Care Complications:         No immediate complications. Procedure:             Pre-Anesthesia Assessment:                        - Prior to the procedure, a History and Physical was                         performed, and patient medications, allergies and                         sensitivities were reviewed. The patient's tolerance                         of previous anesthesia was reviewed.                        - The risks and benefits of the procedure and the                         sedation options and risks were discussed with the                         patient. All questions were answered and informed                         consent was obtained.                        - ASA Grade Assessment: II - A patient with mild                         systemic disease.                        After obtaining informed consent, the colonoscope was                         passed under direct vision. Throughout the procedure,                         the patient's blood pressure, pulse, and oxygen  saturations were monitored continuously. The                         Colonoscope was introduced through the anus and                         advanced to the the cecum, identified by the                         appendiceal orifice. The colonoscopy was performed                          with ease. The patient tolerated the procedure well.                         The quality of the bowel preparation was adequate. Findings:      The perianal and digital rectal examinations were normal.      Non-bleeding internal hemorrhoids were found during retroflexion. The       hemorrhoids were large and Grade I (internal hemorrhoids that do not       prolapse).      Two sessile polyps were found in the descending colon. The polyps were 5       to 7 mm in size. These polyps were removed with a cold snare. Resection       and retrieval were complete.      A 5 mm polyp was found in the transverse colon. The polyp was sessile.       The polyp was removed with a cold snare. Resection and retrieval were       complete.      A 15 mm polyp was found in the cecum. The polyp was sessile.       Preparations were made for mucosal resection. Saline was injected to       raise the lesion. Snare mucosal resection was performed. Resection and       retrieval were complete. To prevent bleeding after mucosal resection,       one hemostatic clip was successfully placed. There was no bleeding       during, or at the end, of the procedure. Borders of the polyp marked       using NBI or blue light      The exam was otherwise without abnormality on direct and retroflexion       views. Impression:            - Non-bleeding internal hemorrhoids.                        - Two 5 to 7 mm polyps in the descending colon,                         removed with a cold snare. Resected and retrieved.                        - One 5 mm polyp in the transverse colon, removed with                         a cold snare. Resected and retrieved.                        -  One 15 mm polyp in the cecum, removed with mucosal                         resection. Resected and retrieved. Clip was placed.                        - The examination was otherwise normal on direct and                         retroflexion views.                         - Mucosal resection was performed. Resection and                         retrieval were complete. Recommendation:        - Discharge patient to home (with escort).                        - Resume previous diet.                        - Continue present medications.                        - Await pathology results.                        - Repeat colonoscopy for surveillance based on                         pathology results. Procedure Code(s):     --- Professional ---                        [phone], Colonoscopy, flexible; with endoscopic mucosal                         resection                        45385, 72, Colonoscopy, flexible; with removal of                         tumor(s), polyp(s), or other lesion(s) by snare                         technique Diagnosis Code(s):     --- Professional ---                        K63.5, Polyp of colon                        Z86.010, Personal history of colonic polyps                        K64.0, First degree hemorrhoids CPT copyright 2019 American Medical Association. All rights reserved. The codes documented in this report are preliminary and upon coder review may  be revised to meet current compliance requirements. Jonathon Bellows, MD Jonathon Bellows MD, MD 08/01/2021 9:45:29 AM This report has been signed electronically. Number of Addenda: 0  Note Initiated On: 08/01/2021 9:01 AM Scope Withdrawal Time: 0 hours 25 minutes 11 seconds  Total Procedure Duration: 0 hours 29 minutes 20 seconds  Estimated Blood Loss:  Estimated blood loss: none.      [facility]

## 2021-08-01 NOTE — Anesthesia Preprocedure Evaluation (Signed)
Anesthesia Evaluation  Patient identified by MRN, date of birth, ID band Patient awake    Reviewed: Allergy & Precautions, H&P , NPO status , Patient's Chart, lab work & pertinent test results, reviewed documented beta blocker date and time   Airway Mallampati: II   Neck ROM: full    Dental  (+) Poor Dentition, Teeth Intact   Pulmonary asthma , sleep apnea , pneumonia, COPD,    Pulmonary exam normal        Cardiovascular Exercise Tolerance: Poor hypertension, On Medications negative cardio ROS Normal cardiovascular exam Rhythm:regular Rate:Normal     Neuro/Psych  Headaches, PSYCHIATRIC DISORDERS Depression    GI/Hepatic Neg liver ROS, GERD  Medicated,  Endo/Other  Hypothyroidism   Renal/GU Renal disease  negative genitourinary   Musculoskeletal   Abdominal   Peds  Hematology negative hematology ROS (+)   Anesthesia Other Findings Past Medical History: No date: Asthma 07/04/2021: Basal cell carcinoma     Comment:  L nasal ala. 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 BMI    Body Mass Index: 33.45 kg/m     Reproductive/Obstetrics negative OB ROS                             Anesthesia Physical Anesthesia Plan  ASA: 4  Anesthesia Plan: General   Post-op Pain Management:    Induction:   PONV Risk Score and Plan:   Airway Management Planned:    Additional Equipment:   Intra-op Plan:   Post-operative Plan:   Informed Consent: I have reviewed the patients History and Physical, chart, labs and discussed the procedure including the risks, benefits and alternatives for the proposed anesthesia with the patient or authorized representative who has indicated his/her understanding and acceptance.     Dental Advisory Given  Plan Discussed with: CRNA  Anesthesia Plan Comments:         Anesthesia Quick Evaluation

## 2021-08-01 NOTE — H&P (Signed)
Jonathon Bellows, MD [address] [place], [place], [place], [address] [address], [place], [place], [place], [address] Phone: [phone]  Fax: [phone]  Primary Care Physician:  [name], NP   Pre-Procedure History & Physical: HPI:  Dakota Harper is a 75 y.o. male is here for an colonoscopy.   Past Medical History:  Diagnosis Date   Asthma    Basal cell carcinoma 07/04/2021   L nasal ala. 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   R scapula, mod atypia   Ear pain    GERD (gastroesophageal reflux disease)    Headache    Hypercholesteremia    Hypertension    Hypothyroidism    Neuropathy    Sleep apnea    Sleep disorder     Past Surgical History:  Procedure Laterality Date   CHOLECYSTECTOMY     COLONOSCOPY     HERNIA REPAIR     pituitary tumor excision      Prior to Admission medications   Medication Sig Start Date End Date Taking? Authorizing Provider  aspirin EC 81 MG tablet Take 81 mg by mouth at bedtime.    Yes [provider]  famotidine (PEPCID) 40 MG tablet Take 40 mg by mouth every evening.   Yes [provider]  fluticasone furoate-vilanterol (BREO ELLIPTA) 200-25 MCG/INH AEPB Inhale 1 puff into the lungs daily. 08/10/20  Yes [provider]  levothyroxine (SYNTHROID, LEVOTHROID) 50 MCG tablet Take 50 mcg daily before breakfast by mouth.   Yes [provider]  losartan (COZAAR) 25 MG tablet Take 25 mg by mouth daily.   Yes [provider]  acetaminophen (TYLENOL) 325 MG tablet Take 650 mg by mouth every 4 (four) hours as needed for mild pain.    [provider]  acetaminophen (TYLENOL) 500 MG tablet Take 500 mg by mouth every 4 (four) hours as needed for moderate pain or mild pain.    [provider]  albuterol (PROVENTIL HFA;VENTOLIN HFA) 108 (90 BASE) MCG/ACT  inhaler Inhale 1 puff into the lungs 3 (three) times daily as needed for wheezing or shortness of breath. Patient not taking: Reported on 08/01/2021    [provider]  alum & mag hydroxide-simeth (MAALOX/MYLANTA) 200-200-20 MG/5ML suspension Take 30 mLs by mouth 4 (four) times daily as needed for indigestion or heartburn.    [provider]  amLODipine (NORVASC) 5 MG tablet Take 1 tablet by mouth daily. 11/02/15   [provider]  atorvastatin (LIPITOR) 10 MG tablet Take 10 mg by mouth every evening.    [provider]  barrier cream (NON-SPECIFIED) CREA Apply 1 application topically as needed (post-toileting skin protection).    [provider]  bisacodyl (DULCOLAX) 5 MG EC tablet Take as directed for colonic prep 01/27/21   [provider]  carbamide peroxide (EAR WAX REMOVAL DROPS) 6.5 % OTIC solution Place 5 drops into both ears every Sunday.    [provider]  chlorhexidine (PERIDEX) 0.12 % solution Use as directed 15 mLs in the mouth or throat 2 (two) times daily as needed (oral discomfort).    [provider]  cycloSPORINE (RESTASIS) 0.05 % ophthalmic emulsion Place 1 drop into both eyes 2 (two) times daily as needed (dry eyes).    [provider]  diclofenac Sodium (VOLTAREN) 1 % GEL Apply topically. 03/31/21  [provider]  diphenhydrAMINE (BENADRYL) 25 MG tablet Take 25 mg by mouth every 6 (six) hours as needed for itching or allergies.    [provider]  fenofibrate 54 MG tablet Take 1 tablet (54 mg total) by mouth daily. 11/01/20   Sidney Ace, MD  finasteride (PROSCAR) 5 MG tablet Take 1 tablet (5 mg total) by mouth daily. 01/22/20   Zara Council A, PA-C  Fluocinolone Acetonide Body 0.01 % OIL Apply to scalp 1-2 times a day and leave on. 02/28/21   Brendolyn Patty, MD  fluocinonide (LIDEX) 0.05 % external solution Apply 1 application topically 2 (two) times daily.    [provider]  fluocinonide ointment (LIDEX) 1.61 % Apply 1 application topically 2 (two) times daily as needed (pruritus). (apply to legs)    [provider]  gabapentin (NEURONTIN) 100 MG capsule Take 200 mg by mouth at bedtime. (take with 600mg  tablet to equal 800mg  total)    [provider]  gabapentin (NEURONTIN) 600 MG tablet Take 600 mg by mouth at bedtime. (take with 100mg  capsules to equal 800mg  total)    [provider]  guaiFENesin (ROBITUSSIN) 100 MG/5ML liquid Take 300 mg by mouth every 6 (six) hours as needed for cough.    [provider]  guaiFENesin-dextromethorphan (ROBITUSSIN DM) 100-10 MG/5ML syrup Take 5 mLs by mouth every 4 (four) hours as needed for cough. 11/01/20   Sidney Ace, MD  hydrocortisone (ANUSOL-HC) 2.5 % rectal cream SMARTSIG:1 Topical Daily PRN 03/31/21   [provider]  hydrocortisone 1 % ointment Apply 1 application topically daily as needed for itching.    [provider]  ibuprofen (ADVIL) 400 MG tablet Take 400 mg by mouth 3 (three) times daily as needed for mild pain or moderate pain.    [provider]  ipratropium-albuterol (DUONEB) 0.5-2.5 (3) MG/3ML SOLN Take 3 mLs by nebulization 2 (two) times daily. 07/21/20   [provider]  ketoconazole (NIZORAL) 2 % shampoo Massage into scalp daily and let sit several minutes before rinsing. 02/28/21   Brendolyn Patty, MD  lactulose (CHRONULAC) 10 GM/15ML solution Take 30 mLs (20 g total) by mouth daily as needed for mild constipation. 03/20/21   Paulette Blanch, MD  loperamide (IMODIUM) 2 MG capsule Take 4 mg by mouth as needed for diarrhea or loose stools ((max 8 doses in 24 hours)).    [provider]  magnesium hydroxide (MILK OF MAGNESIA) 400 MG/5ML suspension Take 30 mLs by mouth daily as needed for mild constipation or moderate constipation.    [provider]  mirabegron ER (MYRBETRIQ) 50 MG TB24 tablet Take 50 mg by  mouth daily.    [provider]  montelukast (SINGULAIR) 5 MG chewable tablet Chew 5 mg by mouth at bedtime.    [provider]  Multiple Vitamin (MULTI-VITAMINS) TABS Take 1 tablet by mouth daily.     [provider]  nortriptyline (PAMELOR) 75 MG capsule Take 150 mg by mouth daily.     [provider]  nystatin-triamcinolone (MYCOLOG II) cream SMARTSIG:1 Topical Daily PRN 03/17/21   [provider]  omeprazole (PRILOSEC) 10 MG capsule Take 10 mg by mouth daily.    [provider]  polyethylene glycol (MIRALAX / GLYCOLAX) 17 g packet Take 17 g by mouth daily as needed for mild constipation or moderate constipation.    [provider]  polyethylene glycol powder (GLYCOLAX/MIRALAX) 17 GM/SCOOP powder  03/31/21   [provider]  polyethylene glycol-electrolytes (NULYTELY) 420 g solution Prepare according to package instructions. Starting at 5:00 PM: Drink one 8 oz glass of mixture every 15 minutes until you finish half of the jug. Five hours prior to procedure, drink 8 oz glass of mixture every 15 minutes until it is all gone. Make sure you do not drink anything 4 hours prior to your procedure. 06/22/21   Jonathon Bellows, MD  tamsulosin (FLOMAX) 0.4 MG CAPS capsule Take 0.4 mg by mouth at bedtime.    [provider]  tiZANidine (ZANAFLEX) 2 MG tablet Take 2 mg by mouth at bedtime.    [provider]    Allergies as of 06/22/2021 - Review Complete 06/09/2021  Allergen Reaction Noted   Saw palmetto Hives and Rash 02/19/2013   Other Other (See Comments) and Rash 05/25/2011   Sulfa antibiotics Rash 02/11/2013   Sulfacetamide sodium Rash 09/10/2014    Family History  Problem Relation Age of Onset   Prostate cancer Neg Hx    Bladder Cancer Neg Hx    Kidney cancer Neg Hx     Social History   Socioeconomic History   Marital status: Single    Spouse name: Not on file   Number of children: Not on file   Years of  education: Not on file   Highest education level: Not on file  Occupational History   Not on file  Tobacco Use   Smoking status: Never   Smokeless tobacco: Never  Vaping Use   Vaping Use: Never used  Substance and Sexual Activity   Alcohol use: No   Drug use: No   Sexual activity: Not on file  Other Topics Concern   Not on file  Social History Narrative   Not on file   Social Determinants of Health   Financial Resource Strain: Not on file  Food Insecurity: Not on file  Transportation Needs: Not on file  Physical Activity: Not on file  Stress: Not on file  Social Connections: Not on file  Intimate Partner Violence: Not on file    Review of Systems: See HPI, otherwise negative ROS  Physical Exam: BP 135/87   Pulse 86   Temp (!) 96 F (35.6 C) (Temporal)   Resp 18   Ht 5\' 8"  (1.727 m)   Wt 99.8 kg   SpO2 100%   BMI 33.45 kg/m  General:   Alert,  pleasant and cooperative in NAD Head:  Normocephalic and atraumatic. Neck:  Supple; no masses or thyromegaly. Lungs:  Clear throughout to auscultation, normal respiratory effort.    Heart:  +S1, +S2, Regular rate and rhythm, No edema. Abdomen:  Soft, nontender and nondistended. Normal bowel sounds, without guarding, and without rebound.   Neurologic:  Alert and  oriented x4;  grossly normal neurologically.  Impression/Plan: Joaquin Music Fedrick is here for an colonoscopy to be performed for surveillance due to prior history of colon polyps   Risks, benefits, limitations, and alternatives regarding  colonoscopy have been reviewed with the patient.  Questions have been answered.  All parties agreeable.   Jonathon Bellows, MD  08/01/2021, 8:59 AM

## 2021-08-01 NOTE — Anesthesia Postprocedure Evaluation (Signed)
Anesthesia Post Note  Patient: Dakota Harper  Procedure(s) Performed: COLONOSCOPY WITH PROPOFOL  Patient location during evaluation: PACU Anesthesia Type: General Level of consciousness: awake and alert Pain management: pain level controlled Vital Signs Assessment: post-procedure vital signs reviewed and stable Respiratory status: spontaneous breathing, nonlabored ventilation, respiratory function stable and patient connected to nasal cannula oxygen Cardiovascular status: blood pressure returned to baseline and stable Postop Assessment: no apparent nausea or vomiting Anesthetic complications: no   No notable events documented.   Last Vitals:  Vitals:   08/01/21 0850 08/01/21 0946  BP: 135/87 (!) 99/48  Pulse: 86 72  Resp: 18 10  Temp: (!) 35.6 C (!) 35.6 C  SpO2: 100% 100%    Last Pain:  Vitals:   08/01/21 0946  TempSrc: Tympanic  PainSc: [place] Brennan Litzinger

## 2021-08-02 ENCOUNTER — Encounter: Payer: Self-pay | Admitting: Gastroenterology

## 2021-08-02 ENCOUNTER — Ambulatory Visit: Payer: 59 | Admitting: Dermatology

## 2021-08-02 LAB — SURGICAL PATHOLOGY

## 2021-08-03 ENCOUNTER — Telehealth: Payer: Self-pay

## 2021-08-03 NOTE — Telephone Encounter (Signed)
Patient walked in today regarding medications that he is using for his seborrheic dermatitis and no improvement. He has been using Ketoconazole Shampoo, Fluocinolone Oil and Lidex Solution. Patient is asking for something oral to take?  I wasn't sure if you wanted to try alternative medication or bring him in for an appointment on October 10th or 11th to be seen?

## 2021-08-05 ENCOUNTER — Telehealth: Payer: Self-pay

## 2021-08-05 NOTE — Telephone Encounter (Signed)
Called pt discussed he will need to make an appt with Dr Chauncey Cruel for further evaluation if he would like a oral treatment for Seb derm, pt will call to schedule an appt next week

## 2021-08-07 ENCOUNTER — Encounter: Payer: Self-pay | Admitting: Gastroenterology

## 2021-09-02 IMAGING — DX DG CHEST 1V PORT
1 series · 1 of 1 positions shown · non-contrast
Comparison: 11/25/2019

CLINICAL DATA: Fever

EXAM:
PORTABLE CHEST 1 VIEW

[chest ap]
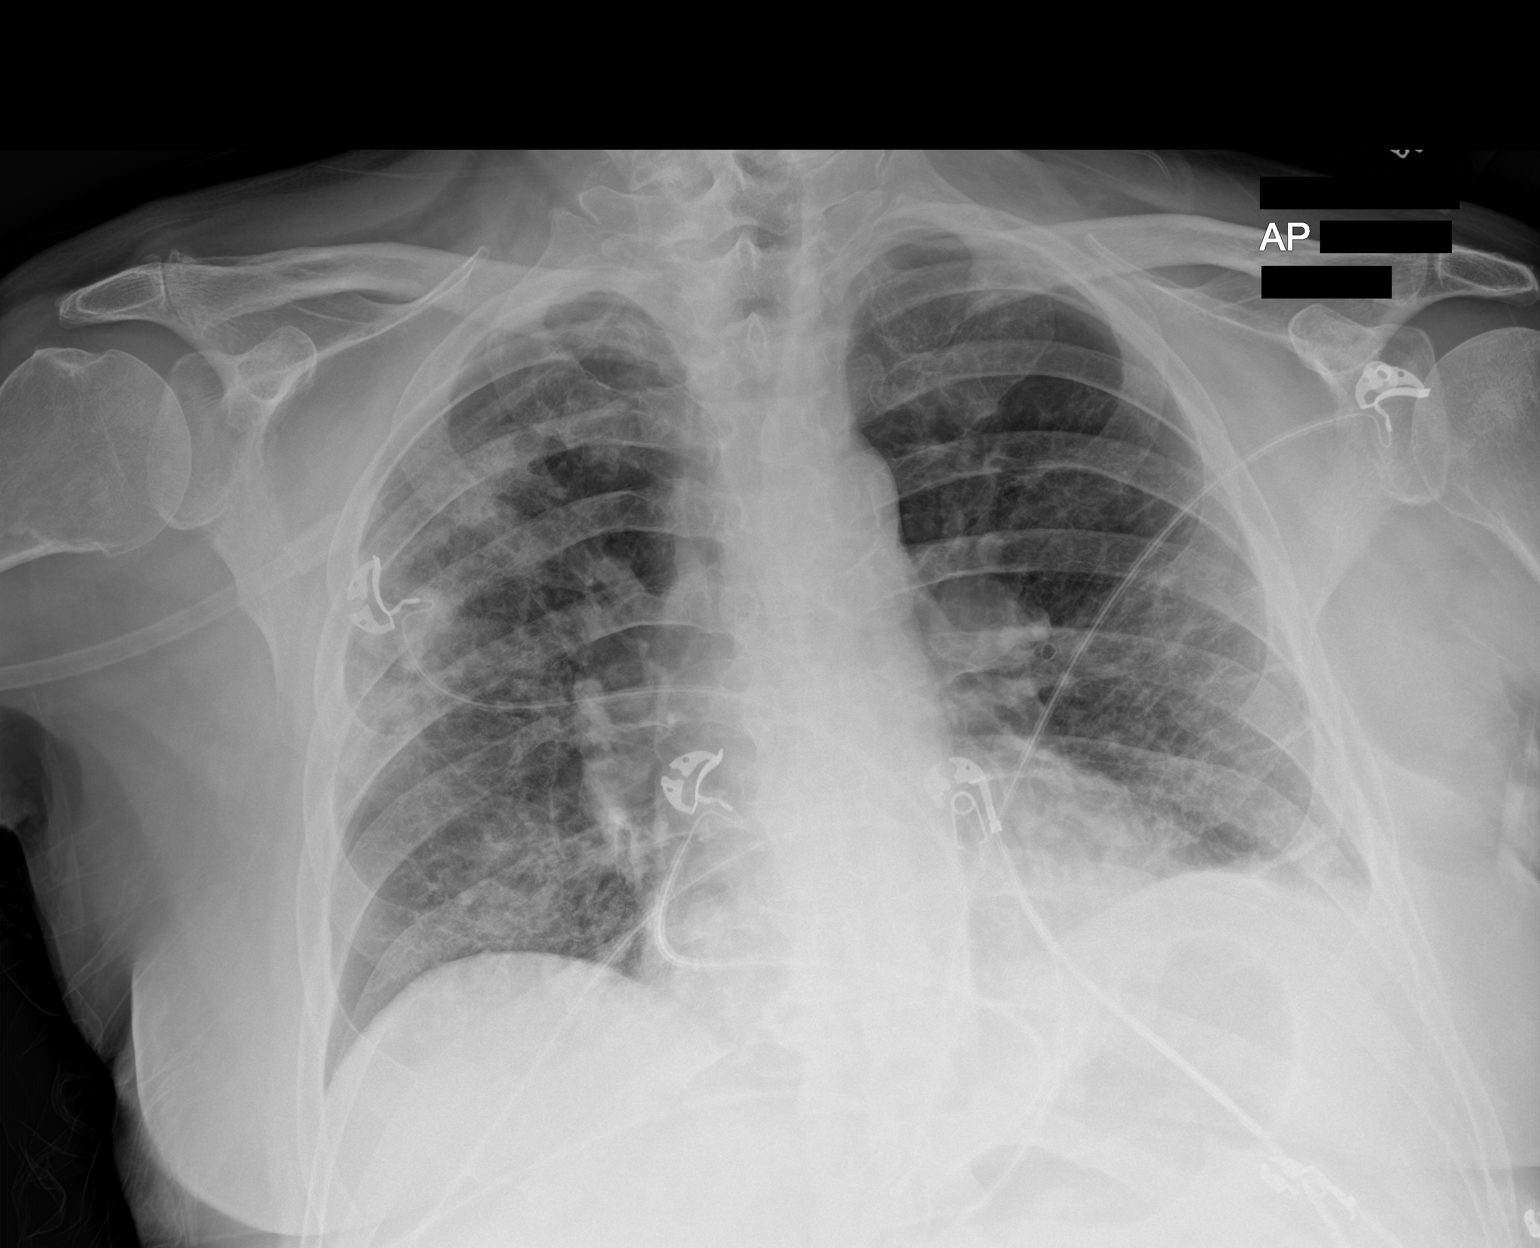

[1 of 1 positions shown; findings below may reference images not displayed]

FINDINGS: Heart size within normal limits. Low lung volumes. Atherosclerotic
calcification of the aortic knob. Patchy airspace opacity within the
right upper lobe. Bibasilar interstitial prominence. No large
pleural fluid collection. No pneumothorax.
IMPRESSION: Patchy airspace opacity within the right upper lobe, suspicious for
pneumonia.

## 2021-09-15 ENCOUNTER — Ambulatory Visit (INDEPENDENT_AMBULATORY_CARE_PROVIDER_SITE_OTHER): Payer: 59 | Admitting: Podiatry

## 2021-09-15 ENCOUNTER — Encounter (INDEPENDENT_AMBULATORY_CARE_PROVIDER_SITE_OTHER): Payer: Self-pay

## 2021-09-15 ENCOUNTER — Encounter: Payer: Self-pay | Admitting: Podiatry

## 2021-09-15 ENCOUNTER — Other Ambulatory Visit: Payer: Self-pay

## 2021-09-15 DIAGNOSIS — N179 Acute kidney failure, unspecified: Secondary | ICD-10-CM

## 2021-09-15 DIAGNOSIS — L600 Ingrowing nail: Secondary | ICD-10-CM

## 2021-09-15 DIAGNOSIS — N183 Chronic kidney disease, stage 3 unspecified: Secondary | ICD-10-CM

## 2021-09-15 DIAGNOSIS — L84 Corns and callosities: Secondary | ICD-10-CM

## 2021-09-15 NOTE — Progress Notes (Signed)
This patient presents to the office stating he has pain  due to the callus on his right big toe.       He presents to the office for evaluation and treatment.  He denies any drainage or infection. He presents for preventative foot care services.  Vascular  Dorsalis pedis and posterior tibial pulses are weakly  palpable  B/L.  Capillary return  WNL.  Temperature gradient is  WNL.  Skin turgor  WNL  Sensorium  Senn Weinstein monofilament wire  WNL. Normal tactile sensation.  Nail Exam  Patient has normal nails with no evidence of bacterial or fungal infection. Marked incurvation medial and lateral borders both hallux toes.  No signs of redness or infection noted.  Orthopedic  Exam  Muscle tone and muscle strength  WNL.  No limitations of motion feet  B/L.  No crepitus or joint effusion noted.  Foot type is unremarkable and digits show no abnormalities.  HAV  B/L.   Skin  No open lesions.  Normal skin texture and turgor  .  Symptomatic pinch callus right hallux.  Pinch callus right hallux.  Ingrown nails  hallux  B/l.  Debride hallux callus with # 15 blade.  Followed by dremel usage.  Debride nails hallux with nail nipper and followed by dremel tool usage. RTC 3 months    Gardiner Barefoot DPM

## 2021-10-12 ENCOUNTER — Telehealth: Payer: Self-pay

## 2021-10-12 DIAGNOSIS — L219 Seborrheic dermatitis, unspecified: Secondary | ICD-10-CM

## 2021-10-12 MED ORDER — KETOCONAZOLE 2 % EX SHAM: MEDICATED_SHAMPOO | CUTANEOUS | 4 refills | Status: DC

## 2021-10-12 NOTE — Telephone Encounter (Signed)
Patient came into the office regarding Ketoconazole Shampoo. Patient states he has been using this daily with improvement. After going to the pharmacy today he was advised he was only suppose to be using this three times per week. The reason for patient's visit is because he was out of RFs and wanted to know if it was okay he used this more then three times per week? Patient still declines topical steroid at this time and has changed his mind from using any oral medications.

## 2021-10-12 NOTE — Telephone Encounter (Signed)
Patient advised of information per Dr. Nicole Kindred and RFs sent in.

## 2021-11-09 ENCOUNTER — Institutional Professional Consult (permissible substitution): Payer: 59 | Admitting: Plastic Surgery

## 2021-11-15 ENCOUNTER — Other Ambulatory Visit: Payer: Self-pay

## 2021-11-16 ENCOUNTER — Other Ambulatory Visit: Payer: Self-pay

## 2021-11-16 ENCOUNTER — Encounter: Payer: Self-pay | Admitting: Gastroenterology

## 2021-11-16 ENCOUNTER — Ambulatory Visit (INDEPENDENT_AMBULATORY_CARE_PROVIDER_SITE_OTHER): Payer: 59 | Admitting: Gastroenterology

## 2021-11-16 VITALS — BP 124/70 | HR 84 | Temp 98.3°F | Resp 16 | Wt 206.0 lb

## 2021-11-16 DIAGNOSIS — R1319 Other dysphagia: Secondary | ICD-10-CM

## 2021-11-16 DIAGNOSIS — K219 Gastro-esophageal reflux disease without esophagitis: Secondary | ICD-10-CM | POA: Diagnosis not present

## 2021-11-16 MED ORDER — OMEPRAZOLE 40 MG PO CPDR: 40.0000 mg | DELAYED_RELEASE_CAPSULE | Freq: Every day | ORAL | 0 refills | Status: AC

## 2021-11-16 NOTE — Patient Instructions (Addendum)

## 2021-11-16 NOTE — Progress Notes (Signed)
Jonathon Bellows MD, MRCP(U.K) [address]  [place]  [place], [place] [address]  Main: [phone]  Fax: [phone]   Primary Care Physician: [name], NP  Primary Gastroenterologist:  Dr. Jonathon Bellows   Chief Complaint  Patient presents with   Gastroesophageal Reflux    HPI: Dakota Harper is a 76 y.o. male   Summary of history :  Initially referred and seen on 06/22/2021 for constipation and hemorrhoids.  The constipation had improved significantly with a high-fiber diet.He used to be patient of [facility] and seen last at their office on 01/27/2021. Per their last note has a history of adenomatous polyps in 2015 . Has suffered from constipation and unable to obtain a clean colon for procedure   Interval history 06/22/2021-11/16/2021  08/01/2021: Colonoscopy: 2 polyps were resected in the descending colon 5 to 7 mm in size.  A 5 mm polyp was resected in the transverse colon and a 15 mm polyp in the cecum was resected with EMR.The polyps were tubular adenomas with no dysplasia.  Doing well since last visit.  No issues with constipation.  He states that for the past few months he is having difficulty swallowing liquids and solids.  In addition he also has regurgitation.  He takes omeprazole 10 mg twice a day.   Current Outpatient Medications  Medication Sig Dispense Refill   acetaminophen (TYLENOL) 325 MG tablet Take 650 mg by mouth every 4 (four) hours as needed for mild pain.     acetaminophen (TYLENOL) 500 MG tablet Take 500 mg by mouth every 4 (four) hours as needed for moderate pain or mild pain.     albuterol (PROVENTIL HFA;VENTOLIN HFA) 108 (90 BASE) MCG/ACT inhaler Inhale 1 puff into the lungs 3 (three) times daily as needed for wheezing or shortness of breath.     alum & mag hydroxide-simeth (MAALOX/MYLANTA) 200-200-20 MG/5ML suspension Take 30 mLs by mouth 4 (four) times daily as needed for indigestion or heartburn.     amLODipine (NORVASC) 5 MG tablet  Take 1 tablet by mouth daily.     aspirin EC 81 MG tablet Take 81 mg by mouth at bedtime.      atorvastatin (LIPITOR) 10 MG tablet Take 10 mg by mouth every evening.     barrier cream (NON-SPECIFIED) CREA Apply 1 application topically as needed (post-toileting skin protection).     bisacodyl (DULCOLAX) 5 MG EC tablet Take as directed for colonic prep     carbamide peroxide (EAR WAX REMOVAL DROPS) 6.5 % OTIC solution Place 5 drops into both ears every Sunday.     chlorhexidine (PERIDEX) 0.12 % solution Use as directed 15 mLs in the mouth or throat 2 (two) times daily as needed (oral discomfort).     cycloSPORINE (RESTASIS) 0.05 % ophthalmic emulsion Place 1 drop into both eyes 2 (two) times daily as needed (dry eyes).     diclofenac Sodium (VOLTAREN) 1 % GEL Apply topically.     diphenhydrAMINE (BENADRYL) 25 MG tablet Take 25 mg by mouth every 6 (six) hours as needed for itching or allergies.     famotidine (PEPCID) 40 MG tablet Take 40 mg by mouth every evening.     fenofibrate 54 MG tablet Take 1 tablet (54 mg total) by mouth daily.     finasteride (PROSCAR) 5 MG tablet Take 1 tablet (5 mg total) by mouth daily. 90 tablet 3   Fluocinolone Acetonide Body 0.01 % OIL Apply to scalp 1-2 times a  day and leave on. 120 mL 3   fluocinonide (LIDEX) 0.05 % external solution Apply 1 application topically 2 (two) times daily.     fluocinonide ointment (LIDEX) 4.70 % Apply 1 application topically 2 (two) times daily as needed (pruritus). (apply to legs)     fluticasone furoate-vilanterol (BREO ELLIPTA) 200-25 MCG/INH AEPB Inhale 1 puff into the lungs daily.     gabapentin (NEURONTIN) 400 MG capsule Take 400 mg by mouth daily.     gabapentin (NEURONTIN) 600 MG tablet Take 600 mg by mouth at bedtime. (take with 100mg  capsules to equal 800mg  total)     guaiFENesin (ROBITUSSIN) 100 MG/5ML liquid Take 300 mg by mouth every 6 (six) hours as needed for cough.     guaiFENesin-dextromethorphan  (ROBITUSSIN DM) 100-10 MG/5ML syrup Take 5 mLs by mouth every 4 (four) hours as needed for cough. 118 mL 0   hydrocortisone (ANUSOL-HC) 2.5 % rectal cream SMARTSIG:1 Topical Daily PRN     hydrocortisone 1 % ointment Apply 1 application topically daily as needed for itching.     ibuprofen (ADVIL) 400 MG tablet Take 400 mg by mouth 3 (three) times daily as needed for mild pain or moderate pain.     ipratropium-albuterol (DUONEB) 0.5-2.5 (3) MG/3ML SOLN Take 3 mLs by nebulization 2 (two) times daily.     ketoconazole (NIZORAL) 2 % shampoo Massage into scalp daily and let sit several minutes before rinsing. 120 mL 4   lactulose (CHRONULAC) 10 GM/15ML solution Take 30 mLs (20 g total) by mouth daily as needed for mild constipation. 120 mL 0   levothyroxine (SYNTHROID, LEVOTHROID) 50 MCG tablet Take 50 mcg daily before breakfast by mouth.     loperamide (IMODIUM) 2 MG capsule Take 4 mg by mouth as needed for diarrhea or loose stools ((max 8 doses in 24 hours)).     losartan (COZAAR) 25 MG tablet Take 25 mg by mouth daily.     magnesium hydroxide (MILK OF MAGNESIA) 400 MG/5ML suspension Take 30 mLs by mouth daily as needed for mild constipation or moderate constipation.     mirabegron ER (MYRBETRIQ) 50 MG TB24 tablet Take 50 mg by mouth daily.     montelukast (SINGULAIR) 5 MG chewable tablet Chew 5 mg by mouth at bedtime.     Multiple Vitamin (MULTI-VITAMINS) TABS Take 1 tablet by mouth daily.      nortriptyline (PAMELOR) 75 MG capsule Take 150 mg by mouth daily.      nystatin-triamcinolone (MYCOLOG II) cream SMARTSIG:1 Topical Daily PRN     omeprazole (PRILOSEC) 10 MG capsule Take 10 mg by mouth daily.     polyethylene glycol (MIRALAX / GLYCOLAX) 17 g packet Take 17 g by mouth daily as needed for mild constipation or moderate constipation.     polyethylene glycol powder (GLYCOLAX/MIRALAX) 17 GM/SCOOP powder      polyethylene glycol-electrolytes (NULYTELY) 420 g solution Prepare  according to package instructions. Starting at 5:00 PM: Drink one 8 oz glass of mixture every 15 minutes until you finish half of the jug. Five hours prior to procedure, drink 8 oz glass of mixture every 15 minutes until it is all gone. Make sure you do not drink anything 4 hours prior to your procedure. 8000 mL 0   propranolol (INDERAL) 10 MG tablet Take by mouth.     tamsulosin (FLOMAX) 0.4 MG CAPS capsule Take 0.4 mg by mouth at bedtime.     tiZANidine (ZANAFLEX) 2 MG tablet Take 2 mg by mouth  at bedtime.     No current facility-administered medications for this visit.    Allergies as of 11/16/2021 - Review Complete 11/16/2021  Allergen Reaction Noted   Saw palmetto Hives and Rash 02/19/2013   Other Other (See Comments) and Rash 05/25/2011   Sulfa antibiotics Rash 02/11/2013   Sulfacetamide sodium Rash 09/10/2014    ROS:  General: Negative for anorexia, weight loss, fever, chills, fatigue, weakness. ENT: Negative for hoarseness, difficulty swallowing , nasal congestion. CV: Negative for chest pain, angina, palpitations, dyspnea on exertion, peripheral edema.  Respiratory: Negative for dyspnea at rest, dyspnea on exertion, cough, sputum, wheezing.  GI: See history of present illness. GU:  Negative for dysuria, hematuria, urinary incontinence, urinary frequency, nocturnal urination.  Endo: Negative for unusual weight change.    Physical Examination:   BP 124/70    Pulse 84    Temp 98.3 F (36.8 C) (Oral)    Resp 16    Wt 206 lb (93.4 kg)    BMI 31.32 kg/m   General: Well-nourished, well-developed in no acute distress.  Eyes: No icterus. Conjunctivae pink. Mouth: Oropharyngeal mucosa moist and pink , no lesions erythema or exudate. Lungs: Clear to auscultation bilaterally. Non-labored. Heart: Regular rate and rhythm, no murmurs rubs or gallops.  Abdomen: Bowel sounds are normal, nontender, nondistended, no hepatosplenomegaly or masses, no abdominal bruits or hernia , no  rebound or guarding.   Extremities: No lower extremity edema. No clubbing or deformities. Neuro: Alert and oriented x 3.  Grossly intact. Skin: Warm and dry, no jaundice.   Psych: Alert and cooperative, normal mood and affect.   Imaging Studies: No results found.  Assessment and Plan:   IBN STIEF is a 76 y.o. y/o male here to follow-up for constipation which had previously responded to high-fiber diet and had a colonoscopy recently for colon cancer screening with a background of constipation and was found to have multiple tubular adenomas that were resected.  On this visit he has new issues of dysphagia and regurgitation/acid reflux.  Plan 1.  EGD next week to evaluate for dysphagia and rule out eosinophilic esophagitis 2.  Commence on omeprazole 40 mg once a day prescription will be provided.  For 3 months with no refills 3.  GERD patient information provided advised to eat 2 hours before bedtime.  Use omeprazole daily.  Use a wedge pillow at night.  He should follow-up with his primary care provider subsequently and his symptoms have resolved try to get off the omeprazole if not continue at the lowest dose required to alleviate his symptoms.   I have discussed alternative options, risks & benefits,  which include, but are not limited to, bleeding, infection, perforation,respiratory complication & drug reaction.  The patient agrees with this plan & written consent will be obtained.         Dr Jonathon Bellows  MD,MRCP [facility]) Follow up in as needed

## 2021-11-22 ENCOUNTER — Telehealth: Payer: Self-pay | Admitting: Plastic Surgery

## 2021-11-22 NOTE — Telephone Encounter (Signed)
Returned Dakota Harper's call, she works with the transportation department for the facility Dakota Harper stays in. She stated the patient wants to reschedule his pre operative appointment. Advised her that patient's pre operative appt is  2 weeks from surgery and there is not another date/time available between his pre op and the surgery to allow for adequate clearances etc that may be needed without the possibility of shifting his surgery back which is coordinated with another office. Dakota Harper stated she understood but the patient is insistent on changing. I advised her to let him speak with Dr. Claudia Desanctis tomorrow and do the consultation and we'll go from there. Dakota Harper stated understanding and we'll touch base after visit tomorrow.

## 2021-11-23 ENCOUNTER — Ambulatory Visit (INDEPENDENT_AMBULATORY_CARE_PROVIDER_SITE_OTHER): Payer: 59 | Admitting: Plastic Surgery

## 2021-11-23 ENCOUNTER — Other Ambulatory Visit: Payer: Self-pay

## 2021-11-23 VITALS — BP 143/81 | HR 93 | Ht 68.0 in | Wt 205.0 lb

## 2021-11-23 DIAGNOSIS — C44311 Basal cell carcinoma of skin of nose: Secondary | ICD-10-CM

## 2021-11-23 NOTE — Progress Notes (Signed)
Referring Provider [name], NP [address] [place],  [place] [address]   CC:  Chief Complaint  Patient presents with   Advice Only      Dakota Harper is an 76 y.o. male.  HPI: Patient presents to discuss nasal reconstruction.  He was found to have a basal cell carcinoma in the left alar groove.  He has seen Dr. Hassell Done and is planning to have a Mohs excision and follow-up with me for reconstruction.  He is here to discuss those options.  Allergies  Allergen Reactions   Saw Palmetto Hives and Rash    "Berry form"   Other Other (See Comments) and Rash   Sulfa Antibiotics Rash   Sulfacetamide Sodium Rash    Outpatient Encounter Medications as of 11/23/2021  Medication Sig   acetaminophen (TYLENOL) 325 MG tablet Take 650 mg by mouth every 4 (four) hours as needed for mild pain.   acetaminophen (TYLENOL) 500 MG tablet Take 500 mg by mouth every 4 (four) hours as needed for moderate pain or mild pain.   albuterol (PROVENTIL HFA;VENTOLIN HFA) 108 (90 BASE) MCG/ACT inhaler Inhale 1 puff into the lungs 3 (three) times daily as needed for wheezing or shortness of breath.   alum & mag hydroxide-simeth (MAALOX/MYLANTA) 200-200-20 MG/5ML suspension Take 30 mLs by mouth 4 (four) times daily as needed for indigestion or heartburn.   amLODipine (NORVASC) 5 MG tablet Take 1 tablet by mouth daily.   aspirin EC 81 MG tablet Take 81 mg by mouth at bedtime.    atorvastatin (LIPITOR) 10 MG tablet Take 10 mg by mouth every evening.   barrier cream (NON-SPECIFIED) CREA Apply 1 application topically as needed (post-toileting skin protection).   carbamide peroxide (EAR WAX REMOVAL DROPS) 6.5 % OTIC solution Place 5 drops into both ears every Sunday.   chlorhexidine (PERIDEX) 0.12 % solution Use as directed 15 mLs in the mouth or throat 2 (two) times daily as needed (oral discomfort).   cycloSPORINE (RESTASIS) 0.05 % ophthalmic emulsion Place 1 drop into both eyes 2 (two) times  daily as needed (dry eyes).   diclofenac Sodium (VOLTAREN) 1 % GEL Apply topically.   famotidine (PEPCID) 40 MG tablet Take 40 mg by mouth every evening.   fenofibrate 54 MG tablet Take 1 tablet (54 mg total) by mouth daily.   finasteride (PROSCAR) 5 MG tablet Take 1 tablet (5 mg total) by mouth daily.   Fluocinolone Acetonide Body 0.01 % OIL Apply to scalp 1-2 times a day and leave on.   fluocinonide (LIDEX) 0.05 % external solution Apply 1 application topically 2 (two) times daily.   fluocinonide ointment (LIDEX) 4.48 % Apply 1 application topically 2 (two) times daily as needed (pruritus). (apply to legs)   fluticasone furoate-vilanterol (BREO ELLIPTA) 200-25 MCG/INH AEPB Inhale 1 puff into the lungs daily.   gabapentin (NEURONTIN) 400 MG capsule Take 400 mg by mouth daily.   gabapentin (NEURONTIN) 600 MG tablet Take 600 mg by mouth at bedtime. (take with 100mg  capsules to equal 800mg  total)   guaiFENesin (ROBITUSSIN) 100 MG/5ML liquid Take 300 mg by mouth every 6 (six) hours as needed for cough.   guaiFENesin-dextromethorphan (ROBITUSSIN DM) 100-10 MG/5ML syrup Take 5 mLs by mouth every 4 (four) hours as needed for cough.   ketoconazole (NIZORAL) 2 % shampoo Massage into scalp daily and let sit several minutes before rinsing.   lactulose (CHRONULAC) 10 GM/15ML solution Take 30 mLs (20 g total) by mouth  daily as needed for mild constipation.   levothyroxine (SYNTHROID, LEVOTHROID) 50 MCG tablet Take 50 mcg daily before breakfast by mouth.   losartan (COZAAR) 25 MG tablet Take 25 mg by mouth daily.   mirabegron ER (MYRBETRIQ) 50 MG TB24 tablet Take 50 mg by mouth daily.   montelukast (SINGULAIR) 5 MG chewable tablet Chew 5 mg by mouth at bedtime.   Multiple Vitamin (MULTI-VITAMINS) TABS Take 1 tablet by mouth daily.    nortriptyline (PAMELOR) 75 MG capsule Take 150 mg by mouth daily.    nystatin-triamcinolone (MYCOLOG II) cream SMARTSIG:1 Topical Daily PRN   omeprazole (PRILOSEC) 40 MG  capsule Take 1 capsule (40 mg total) by mouth daily.   tamsulosin (FLOMAX) 0.4 MG CAPS capsule Take 0.4 mg by mouth at bedtime.   tiZANidine (ZANAFLEX) 2 MG tablet Take 2 mg by mouth at bedtime.   [DISCONTINUED] bisacodyl (DULCOLAX) 5 MG EC tablet Take as directed for colonic prep   [DISCONTINUED] diphenhydrAMINE (BENADRYL) 25 MG tablet Take 25 mg by mouth every 6 (six) hours as needed for itching or allergies.   [DISCONTINUED] hydrocortisone (ANUSOL-HC) 2.5 % rectal cream SMARTSIG:1 Topical Daily PRN   [DISCONTINUED] hydrocortisone 1 % ointment Apply 1 application topically daily as needed for itching.   [DISCONTINUED] ibuprofen (ADVIL) 400 MG tablet Take 400 mg by mouth 3 (three) times daily as needed for mild pain or moderate pain.   [DISCONTINUED] ipratropium-albuterol (DUONEB) 0.5-2.5 (3) MG/3ML SOLN Take 3 mLs by nebulization 2 (two) times daily.   [DISCONTINUED] loperamide (IMODIUM) 2 MG capsule Take 4 mg by mouth as needed for diarrhea or loose stools ((max 8 doses in 24 hours)).   [DISCONTINUED] magnesium hydroxide (MILK OF MAGNESIA) 400 MG/5ML suspension Take 30 mLs by mouth daily as needed for mild constipation or moderate constipation.   [DISCONTINUED] polyethylene glycol (MIRALAX / GLYCOLAX) 17 g packet Take 17 g by mouth daily as needed for mild constipation or moderate constipation.   [DISCONTINUED] polyethylene glycol powder (GLYCOLAX/MIRALAX) 17 GM/SCOOP powder    [DISCONTINUED] polyethylene glycol-electrolytes (NULYTELY) 420 g solution Prepare according to package instructions. Starting at 5:00 PM: Drink one 8 oz glass of mixture every 15 minutes until you finish half of the jug. Five hours prior to procedure, drink 8 oz glass of mixture every 15 minutes until it is all gone. Make sure you do not drink anything 4 hours prior to your procedure.   [DISCONTINUED] propranolol (INDERAL) 10 MG tablet Take by mouth.   No facility-administered encounter medications on file as of 11/23/2021.      Past Medical History:  Diagnosis Date   Asthma    Basal cell carcinoma 07/04/2021   L nasal ala. 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   R scapula, mod atypia   Ear pain    GERD (gastroesophageal reflux disease)    Headache    Hypercholesteremia    Hypertension    Hypothyroidism    Neuropathy    Sleep apnea    Sleep disorder     Past Surgical History:  Procedure Laterality Date   CHOLECYSTECTOMY     COLONOSCOPY     COLONOSCOPY WITH PROPOFOL N/A 08/01/2021   Procedure: COLONOSCOPY WITH PROPOFOL;  Surgeon: Jonathon Bellows, MD;  Location: [facility] ENDOSCOPY;  Service: Gastroenterology;  Laterality: N/A;   HERNIA REPAIR     pituitary tumor excision  Family History  Problem Relation Age of Onset   Prostate cancer Neg Hx    Bladder Cancer Neg Hx    Kidney cancer Neg Hx     Social History   Social History Narrative   Not on file     Review of Systems General: Denies fevers, chills, weight loss CV: Denies chest pain, shortness of breath, palpitations  Physical Exam Vitals with BMI 11/23/2021 11/16/2021 08/01/2021  Height 5\' 8"  - -  Weight 205 lbs 206 lbs -  BMI 38.17 - -  Systolic [phone]  Diastolic 81 70 82  Pulse 93 84 75    General:  No acute distress,  Alert and oriented, Non-Toxic, Normal speech and affect On exam he has a faint erythematous lesion right at the lateral aspect of the ala as it approaches the alar groove.  No surrounding scars that are obvious.  Assessment/Plan Patient presents with a basal cell carcinoma the left ala.  We discussed reconstructive options.  I explained that more than likely this would require a skin graft or adjacent tissue transfer.  I explained in general what would be entailed by those procedures.  He is fully understanding.  We discussed risks include bleeding, infection, damage to surrounding  structures and need for additional procedures.  All of his questions were answered.  Cindra Presume 11/23/2021, 9:33 AM

## 2021-11-29 ENCOUNTER — Encounter: Payer: Self-pay | Admitting: Gastroenterology

## 2021-11-30 ENCOUNTER — Encounter: Payer: 59 | Admitting: Physician Assistant

## 2021-11-30 ENCOUNTER — Ambulatory Visit
Admission: RE | Admit: 2021-11-30 | Discharge: 2021-11-30 | Disposition: A | Discharge status: Home / Self Care | Payer: 59 | Source: Ambulatory Visit | Attending: Gastroenterology | Admitting: Gastroenterology

## 2021-11-30 ENCOUNTER — Encounter
Admission: RE | Disposition: A | Discharge status: Home / Self Care | Payer: Self-pay | Source: Ambulatory Visit | Attending: Gastroenterology

## 2021-11-30 ENCOUNTER — Ambulatory Visit: Payer: 59 | Admitting: Anesthesiology

## 2021-11-30 DIAGNOSIS — K219 Gastro-esophageal reflux disease without esophagitis: Secondary | ICD-10-CM | POA: Diagnosis not present

## 2021-11-30 DIAGNOSIS — I10 Essential (primary) hypertension: Secondary | ICD-10-CM | POA: Insufficient documentation

## 2021-11-30 DIAGNOSIS — N289 Disorder of kidney and ureter, unspecified: Secondary | ICD-10-CM | POA: Insufficient documentation

## 2021-11-30 DIAGNOSIS — R1319 Other dysphagia: Secondary | ICD-10-CM | POA: Diagnosis not present

## 2021-11-30 DIAGNOSIS — E039 Hypothyroidism, unspecified: Secondary | ICD-10-CM | POA: Diagnosis not present

## 2021-11-30 DIAGNOSIS — G4730 Sleep apnea, unspecified: Secondary | ICD-10-CM | POA: Diagnosis not present

## 2021-11-30 DIAGNOSIS — I209 Angina pectoris, unspecified: Secondary | ICD-10-CM | POA: Diagnosis not present

## 2021-11-30 DIAGNOSIS — J449 Chronic obstructive pulmonary disease, unspecified: Secondary | ICD-10-CM | POA: Insufficient documentation

## 2021-11-30 DIAGNOSIS — R1314 Dysphagia, pharyngoesophageal phase: Secondary | ICD-10-CM | POA: Insufficient documentation

## 2021-11-30 HISTORY — PX: ESOPHAGOGASTRODUODENOSCOPY (EGD) WITH PROPOFOL: SHX5813

## 2021-11-30 SURGERY — ESOPHAGOGASTRODUODENOSCOPY (EGD) WITH PROPOFOL
Anesthesia: General

## 2021-11-30 MED ORDER — LIDOCAINE HCL (PF) 2 % IJ SOLN
INTRAMUSCULAR | Status: AC
  Filled 2021-11-30: qty 5

## 2021-11-30 MED ORDER — SODIUM CHLORIDE 0.9 % IV SOLN
INTRAVENOUS | Status: DC
  Administered 2021-11-30: 09:00:00 20 mL/h via INTRAVENOUS

## 2021-11-30 MED ORDER — PROPOFOL 500 MG/50ML IV EMUL
INTRAVENOUS | Status: DC | PRN
  Administered 2021-11-30: 150 ug/kg/min via INTRAVENOUS

## 2021-11-30 MED ORDER — PROPOFOL 10 MG/ML IV BOLUS
INTRAVENOUS | Status: DC | PRN
Start: 2021-11-30 — End: 2021-11-30
  Administered 2021-11-30: 50 mg via INTRAVENOUS

## 2021-11-30 MED ORDER — LIDOCAINE HCL (CARDIAC) PF 100 MG/5ML IV SOSY
PREFILLED_SYRINGE | INTRAVENOUS | Status: DC | PRN
  Administered 2021-11-30: 50 mg via INTRAVENOUS

## 2021-11-30 MED ORDER — LIDOCAINE HCL (PF) 1 % IJ SOLN
INTRAMUSCULAR | Status: AC
  Filled 2021-11-30: qty 2

## 2021-11-30 MED ORDER — PROPOFOL 500 MG/50ML IV EMUL
INTRAVENOUS | Status: AC
  Filled 2021-11-30: qty 50

## 2021-11-30 NOTE — H&P (Signed)
Jonathon Bellows, MD [address], [place], [place], [place], [address] [address], [place], [place], [place], [address] Phone: [phone]  Fax: [phone]  Primary Care Physician:  [name], NP   Pre-Procedure History & Physical: HPI:  Dakota Harper is a 76 y.o. male is here for an endoscopy    Past Medical History:  Diagnosis Date   Asthma    Basal cell carcinoma 07/04/2021   L nasal ala. 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   R scapula, mod atypia   Ear pain    GERD (gastroesophageal reflux disease)    Headache    Hypercholesteremia    Hypertension    Hypothyroidism    Neuropathy    Sleep apnea    Sleep disorder     Past Surgical History:  Procedure Laterality Date   CHOLECYSTECTOMY     COLONOSCOPY     COLONOSCOPY WITH PROPOFOL N/A 08/01/2021   Procedure: COLONOSCOPY WITH PROPOFOL;  Surgeon: Jonathon Bellows, MD;  Location: [facility] ENDOSCOPY;  Service: Gastroenterology;  Laterality: N/A;   HERNIA REPAIR     pituitary tumor excision      Prior to Admission medications   Medication Sig Start Date End Date Taking? Authorizing Provider  acetaminophen (TYLENOL) 325 MG tablet Take 650 mg by mouth every 4 (four) hours as needed for mild pain.   Yes [provider]  acetaminophen (TYLENOL) 500 MG tablet Take 500 mg by mouth every 4 (four) hours as needed for moderate pain or mild pain.   Yes [provider]  albuterol (PROVENTIL HFA;VENTOLIN HFA) 108 (90 BASE) MCG/ACT inhaler Inhale 1 puff into the lungs 3 (three) times daily as needed for wheezing or shortness of breath.   Yes [provider]  alum & mag hydroxide-simeth (MAALOX/MYLANTA) 200-200-20 MG/5ML suspension Take 30 mLs by mouth 4 (four) times daily as needed for indigestion or heartburn.   Yes [provider]  amLODipine (NORVASC) 5 MG  tablet Take 1 tablet by mouth daily. 11/02/15  Yes [provider]  aspirin EC 81 MG tablet Take 81 mg by mouth at bedtime.    Yes [provider]  atorvastatin (LIPITOR) 10 MG tablet Take 10 mg by mouth every evening.   Yes [provider]  barrier cream (NON-SPECIFIED) CREA Apply 1 application topically as needed (post-toileting skin protection).   Yes [provider]  carbamide peroxide (EAR WAX REMOVAL DROPS) 6.5 % OTIC solution Place 5 drops into both ears every Sunday.   Yes [provider]  chlorhexidine (PERIDEX) 0.12 % solution Use as directed 15 mLs in the mouth or throat 2 (two) times daily as needed (oral discomfort).   Yes [provider]  diclofenac Sodium (VOLTAREN) 1 % GEL Apply topically. 03/31/21  Yes [provider]  famotidine (PEPCID) 40 MG tablet Take 40 mg by mouth every evening.   Yes [provider]  fenofibrate 54 MG tablet Take 1 tablet (54 mg total) by mouth daily. 11/01/20  Yes Sreenath, Sudheer B, MD  finasteride (PROSCAR) 5 MG tablet Take 1 tablet (5 mg total) by mouth daily. 01/22/20  Yes McGowan, Larene Beach A, PA-C  Fluocinolone Acetonide Body 0.01 % OIL Apply to scalp 1-2 times a day and leave on. 02/28/21  Yes Brendolyn Patty, MD  fluocinonide (LIDEX) 0.05 % external solution Apply 1 application topically  2 (two) times daily.   Yes [provider]  fluocinonide ointment (LIDEX) 5.62 % Apply 1 application topically 2 (two) times daily as needed (pruritus). (apply to legs)   Yes [provider]  fluticasone furoate-vilanterol (BREO ELLIPTA) 200-25 MCG/INH AEPB Inhale 1 puff into the lungs daily. 08/10/20  Yes [provider]  gabapentin (NEURONTIN) 400 MG capsule Take 400 mg by mouth daily. 11/08/21  Yes [provider]  gabapentin (NEURONTIN) 600 MG tablet Take 600 mg by mouth at bedtime. (take with 100mg  capsules to equal 800mg  total)   Yes [provider]   guaiFENesin (ROBITUSSIN) 100 MG/5ML liquid Take 300 mg by mouth every 6 (six) hours as needed for cough.   Yes [provider]  guaiFENesin-dextromethorphan (ROBITUSSIN DM) 100-10 MG/5ML syrup Take 5 mLs by mouth every 4 (four) hours as needed for cough. 11/01/20  Yes Sreenath, Sudheer B, MD  ketoconazole (NIZORAL) 2 % shampoo Massage into scalp daily and let sit several minutes before rinsing. 10/12/21  Yes Brendolyn Patty, MD  lactulose (CHRONULAC) 10 GM/15ML solution Take 30 mLs (20 g total) by mouth daily as needed for mild constipation. 03/20/21  Yes Paulette Blanch, MD  levothyroxine (SYNTHROID, LEVOTHROID) 50 MCG tablet Take 50 mcg daily before breakfast by mouth.   Yes [provider]  losartan (COZAAR) 25 MG tablet Take 25 mg by mouth daily.   Yes [provider]  mirabegron ER (MYRBETRIQ) 50 MG TB24 tablet Take 50 mg by mouth daily.   Yes [provider]  montelukast (SINGULAIR) 5 MG chewable tablet Chew 5 mg by mouth at bedtime.   Yes [provider]  Multiple Vitamin (MULTI-VITAMINS) TABS Take 1 tablet by mouth daily.    Yes [provider]  nortriptyline (PAMELOR) 75 MG capsule Take 150 mg by mouth daily.    Yes [provider]  nystatin-triamcinolone (MYCOLOG II) cream SMARTSIG:1 Topical Daily PRN 03/17/21  Yes [provider]  omeprazole (PRILOSEC) 40 MG capsule Take 1 capsule (40 mg total) by mouth daily. 11/16/21  Yes Jonathon Bellows, MD  tamsulosin (FLOMAX) 0.4 MG CAPS capsule Take 0.4 mg by mouth at bedtime.   Yes [provider]  tiZANidine (ZANAFLEX) 2 MG tablet Take 2 mg by mouth at bedtime.   Yes [provider]  cycloSPORINE (RESTASIS) 0.05 % ophthalmic emulsion Place 1 drop into both eyes 2 (two) times daily as needed (dry eyes).    [provider]    Allergies as of 11/16/2021 - Review Complete 11/16/2021  Allergen Reaction Noted   Saw palmetto Hives and Rash 02/19/2013   Other Other  (See Comments) and Rash 05/25/2011   Sulfa antibiotics Rash 02/11/2013   Sulfacetamide sodium Rash 09/10/2014    Family History  Problem Relation Age of Onset   Prostate cancer Neg Hx    Bladder Cancer Neg Hx    Kidney cancer Neg Hx     Social History   Socioeconomic History   Marital status: Single    Spouse name: Not on file   Number of children: Not on file   Years of education: Not on file   Highest education level: Not on file  Occupational History   Not on file  Tobacco Use   Smoking status: Never   Smokeless tobacco: Never  Vaping Use   Vaping Use: Never used  Substance and Sexual Activity   Alcohol use: No   Drug use: No   Sexual activity: Not on file  Other  Topics Concern   Not on file  Social History Narrative   Not on file   Social Determinants of Health   Financial Resource Strain: Not on file  Food Insecurity: Not on file  Transportation Needs: Not on file  Physical Activity: Not on file  Stress: Not on file  Social Connections: Not on file  Intimate Partner Violence: Not on file    Review of Systems: See HPI, otherwise negative ROS  Physical Exam: BP 138/76    Pulse 80    Temp (!) 96.9 F (36.1 C) (Temporal)    Resp 20    Ht 5\' 8"  (1.727 m)    Wt 93 kg    SpO2 100%    BMI 31.17 kg/m  General:   Alert,  pleasant and cooperative in NAD Head:  Normocephalic and atraumatic. Neck:  Supple; no masses or thyromegaly. Lungs:  Clear throughout to auscultation, normal respiratory effort.    Heart:  +S1, +S2, Regular rate and rhythm, No edema. Abdomen:  Soft, nontender and nondistended. Normal bowel sounds, without guarding, and without rebound.   Neurologic:  Alert and  oriented x4;  grossly normal neurologically.  Impression/Plan: Dakota Harper is here for an endoscopy  to be performed for  evaluation of dysphagia    Risks, benefits, limitations, and alternatives regarding endoscopy have been reviewed with the patient.  Questions have been  answered.  All parties agreeable.   Jonathon Bellows, MD  11/30/2021, 9:16 AM

## 2021-11-30 NOTE — Transfer of Care (Signed)
Immediate Anesthesia Transfer of Care Note  Patient: Dakota Harper  Procedure(s) Performed: ESOPHAGOGASTRODUODENOSCOPY (EGD) WITH PROPOFOL  Patient Location: PACU  Anesthesia Type:General  Level of Consciousness: sedated  Airway & Oxygen Therapy: Patient Spontanous Breathing  Post-op Assessment: Report given to RN and Post -op Vital signs reviewed and stable  Post vital signs: Reviewed and stable  Last Vitals:  Vitals Value Taken Time  BP 110/52 11/30/21 0950  Temp    Pulse 71 11/30/21 0950  Resp 18 11/30/21 0950  SpO2 97 % 11/30/21 0950    Last Pain:  Vitals:   11/30/21 0949  TempSrc:   PainSc: 0-No pain         Complications: No notable events documented.

## 2021-11-30 NOTE — Op Note (Signed)
[facility] Gastroenterology Patient Name: Dakota Harper Procedure Date: 11/30/2021 9:17 AM MRN: [id number] Account #: [id number] Date of Birth: [birth year] Admit Type: Outpatient Age: 76 Room: [facility] ENDO ROOM 3 Gender: Male Note Status: Finalized Instrument Name: Upper Endoscope [id number] Procedure:             Upper GI endoscopy Indications:           Dysphagia Providers:             Jonathon Bellows MD, MD Referring MD:          [name], NP Medicines:             Monitored Anesthesia Care Complications:         No immediate complications. Procedure:             Pre-Anesthesia Assessment:                        - Prior to the procedure, a History and Physical was                         performed, and patient medications, allergies and                         sensitivities were reviewed. The patient's tolerance                         of previous anesthesia was reviewed.                        - The risks and benefits of the procedure and the                         sedation options and risks were discussed with the                         patient. All questions were answered and informed                         consent was obtained.                        - ASA Grade Assessment: II - A patient with mild                         systemic disease.                        After obtaining informed consent, the endoscope was                         passed under direct vision. Throughout the procedure,                         the patient's blood pressure, pulse, and oxygen                         saturations were monitored continuously. The Endoscope                         was introduced through the mouth,  and advanced to the                         third part of duodenum. The upper GI endoscopy was                         accomplished with ease. The patient tolerated the                         procedure well. Findings:      The examined esophagus was normal. A  TTS dilator was passed through the       scope. Dilation with a 15-16.5-18 mm balloon dilator was performed to 18       mm. Biopsies were taken with a cold forceps for histology.      The esophagus was normal.      The cardia and gastric fundus were normal on retroflexion.      The examined duodenum was normal. Impression:            - Normal esophagus. Dilated. Biopsied.                        - Normal esophagus.                        - Normal examined duodenum. Recommendation:        - Discharge patient to home (with escort).                        - Resume previous diet.                        - Continue present medications.                        - Await pathology results.                        - Return to my office as previously scheduled. Procedure Code(s):     --- Professional ---                        [phone], Esophagogastroduodenoscopy, flexible,                         transoral; with transendoscopic balloon dilation of                         esophagus (less than 30 mm diameter)                        43239, 59, Esophagogastroduodenoscopy, flexible,                         transoral; with biopsy, single or multiple Diagnosis Code(s):     --- Professional ---                        R13.10, Dysphagia, unspecified CPT copyright 2019 American Medical Association. All rights reserved. The codes documented in this report are preliminary and upon coder review may  be revised to meet current compliance requirements. Jonathon Bellows, MD Jonathon Bellows MD,  MD 11/30/2021 9:47:22 AM This report has been signed electronically. Number of Addenda: 0 Note Initiated On: 11/30/2021 9:17 AM Estimated Blood Loss:  Estimated blood loss: none.      [facility]

## 2021-11-30 NOTE — Anesthesia Procedure Notes (Signed)
Date/Time: 11/30/2021 9:36 AM Performed by: Johnna Acosta, CRNA Pre-anesthesia Checklist: Patient identified, Emergency Drugs available, Suction available, Patient being monitored and Timeout performed Patient Re-evaluated:Patient Re-evaluated prior to induction Oxygen Delivery Method: Nasal cannula Preoxygenation: Pre-oxygenation with 100% oxygen Induction Type: IV induction

## 2021-11-30 NOTE — Anesthesia Preprocedure Evaluation (Signed)
Anesthesia Evaluation  Patient identified by MRN, date of birth, ID band Patient awake    Reviewed: Allergy & Precautions, NPO status , Patient's Chart, lab work & pertinent test results  History of Anesthesia Complications Negative for: history of anesthetic complications  Airway Mallampati: III  TM Distance: >3 FB Neck ROM: full    Dental  (+) Chipped, Poor Dentition, Missing   Pulmonary neg shortness of breath, asthma , sleep apnea , COPD,    Pulmonary exam normal        Cardiovascular hypertension, + angina Normal cardiovascular exam     Neuro/Psych  Headaches, PSYCHIATRIC DISORDERS    GI/Hepatic Neg liver ROS, GERD  Controlled,  Endo/Other  Hypothyroidism   Renal/GU Renal disease  negative genitourinary   Musculoskeletal   Abdominal   Peds  Hematology negative hematology ROS (+)   Anesthesia Other Findings Patient reports that he does that think that any food or pills are stuck in his throat at this time.  Past Medical History: No date: Asthma 07/04/2021: Basal cell carcinoma     Comment:  L nasal ala. EDC scheduled 08/02/21 No date: BPH (benign prostatic hyperplasia) No date: Colon adenomas No date: COPD (chronic obstructive pulmonary disease) ([place]) 02/28/2021: Dysplastic nevus     Comment:  R scapula, mod atypia No date: Ear pain No date: GERD (gastroesophageal reflux disease) No date: Headache No date: Hypercholesteremia No date: Hypertension No date: Hypothyroidism No date: Neuropathy No date: Sleep apnea No date: Sleep disorder  Past Surgical History: No date: CHOLECYSTECTOMY No date: COLONOSCOPY 08/01/2021: COLONOSCOPY WITH PROPOFOL; N/A     Comment:  Procedure: COLONOSCOPY WITH PROPOFOL;  Surgeon: Jonathon Bellows, MD;  Location: [facility] ENDOSCOPY;  Service:               Gastroenterology;  Laterality: N/A; No date: HERNIA REPAIR 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  BMI    Body  Mass Index: 31.17 kg/m      Reproductive/Obstetrics negative OB ROS                             Anesthesia Physical Anesthesia Plan  ASA: 3  Anesthesia Plan: General   Post-op Pain Management:    Induction: Intravenous  PONV Risk Score and Plan: Propofol infusion and TIVA  Airway Management Planned: Natural Airway and Nasal Cannula  Additional Equipment:   Intra-op Plan:   Post-operative Plan:   Informed Consent: I have reviewed the patients History and Physical, chart, labs and discussed the procedure including the risks, benefits and alternatives for the proposed anesthesia with the patient or authorized representative who has indicated his/her understanding and acceptance.     Dental Advisory Given  Plan Discussed with: Anesthesiologist, CRNA and Surgeon  Anesthesia Plan Comments: (Patient consented for risks of anesthesia including but not limited to:  - adverse reactions to medications - risk of airway placement if required - damage to eyes, teeth, lips or other oral mucosa - nerve damage due to positioning  - sore throat or hoarseness - Damage to heart, brain, nerves, lungs, other parts of body or loss of life  Patient voiced understanding.)        Anesthesia Quick Evaluation

## 2021-11-30 NOTE — Anesthesia Postprocedure Evaluation (Signed)
Anesthesia Post Note  Patient: Dakota Harper  Procedure(s) Performed: ESOPHAGOGASTRODUODENOSCOPY (EGD) WITH PROPOFOL  Patient location during evaluation: Endoscopy Anesthesia Type: General Level of consciousness: awake and alert Pain management: pain level controlled Vital Signs Assessment: post-procedure vital signs reviewed and stable Respiratory status: spontaneous breathing, nonlabored ventilation, respiratory function stable and patient connected to nasal cannula oxygen Cardiovascular status: blood pressure returned to baseline and stable Postop Assessment: no apparent nausea or vomiting Anesthetic complications: no   No notable events documented.   Last Vitals:  Vitals:   11/30/21 0950 11/30/21 1005  BP: (!) 110/52 115/68  Pulse: 71   Resp: 18   Temp:    SpO2: 97%     Last Pain:  Vitals:   11/30/21 1017  TempSrc:   PainSc: 0-No pain                 Precious Haws Aidon Klemens

## 2021-12-01 ENCOUNTER — Encounter: Payer: Self-pay | Admitting: Gastroenterology

## 2021-12-01 LAB — SURGICAL PATHOLOGY

## 2021-12-05 ENCOUNTER — Encounter: Payer: Self-pay | Admitting: Gastroenterology

## 2021-12-05 NOTE — Progress Notes (Addendum)
Patient ID: Dakota Harper, male    DOB: Dec 21, 1945, 77 y.o.   MRN: [id number]  Chief Complaint  Patient presents with   Pre-op Exam      ICD-10-CM   1. Basal cell carcinoma of nose  C44.311        History of Present Illness: Dakota Harper is a 76 y.o.  male  with a history of basal cell carcinoma left alar groove.  He presents for preoperative evaluation for upcoming procedure, nasal reconstruction, scheduled for 12/19/2021 with Dr. Claudia Desanctis.  The patient has not had problems with anesthesia.  Patient takes 81 mg aspirin and has been doing so for years.  Denies any personal cardiac history, but does state that his father had a bypass surgery.  Patient states that his COPD is well controlled with Breo Ellipta and albuterol.  Thyroid well controlled.  Denies OSA.  No personal or family history of blood clots or clotting disorder.  He will hold any vitamins or over-the-counter supplements.  He currently resides in a nursing home.  Last week he was evaluated for ATFL sprain right ankle, denies any obvious trauma.  PCP is [name] at [facility].  He states that he goes for walks often and tries to keep up good health.  He reports that his only allergy is sulfa drugs.  Summary of Previous Visit: Patient was seen for initial consult 11/23/2021.  Plan is for Mohs excision with Dr. Hassell Done with expectation of reconstruction.  Discussed skin graft versus adjacent tissue transfer.  The precise form of reconstruction not yet determined as it will depend on the size and character of Mohs defect.  PMH Significant for: Basal cell carcinoma, HLD, COPD, GERD, thyroid disorder, BPH.   Past Medical History: Allergies: Allergies  Allergen Reactions   Saw Palmetto Hives and Rash    "Berry form"   Other Other (See Comments) and Rash   Sulfa Antibiotics Rash   Sulfacetamide Sodium Rash    Current Medications:  Current Outpatient Medications:    albuterol (PROVENTIL HFA;VENTOLIN HFA) 108  (90 BASE) MCG/ACT inhaler, Inhale 1 puff into the lungs 3 (three) times daily as needed for wheezing or shortness of breath., Disp: , Rfl:    alum & mag hydroxide-simeth (MAALOX/MYLANTA) 200-200-20 MG/5ML suspension, Take 30 mLs by mouth 4 (four) times daily as needed for indigestion or heartburn., Disp: , Rfl:    amLODipine (NORVASC) 5 MG tablet, Take 1 tablet by mouth daily., Disp: , Rfl:    aspirin EC 81 MG tablet, Take 81 mg by mouth at bedtime. , Disp: , Rfl:    atorvastatin (LIPITOR) 10 MG tablet, Take 10 mg by mouth every evening., Disp: , Rfl:    barrier cream (NON-SPECIFIED) CREA, Apply 1 application topically as needed (post-toileting skin protection)., Disp: , Rfl:    carbamide peroxide (EAR WAX REMOVAL DROPS) 6.5 % OTIC solution, Place 5 drops into both ears every Sunday., Disp: , Rfl:    chlorhexidine (PERIDEX) 0.12 % solution, Use as directed 15 mLs in the mouth or throat 2 (two) times daily as needed (oral discomfort)., Disp: , Rfl:    cycloSPORINE (RESTASIS) 0.05 % ophthalmic emulsion, Place 1 drop into both eyes 2 (two) times daily as needed (dry eyes)., Disp: , Rfl:    diclofenac Sodium (VOLTAREN) 1 % GEL, Apply topically., Disp: , Rfl:    famotidine (PEPCID) 40 MG tablet, Take 40 mg by mouth every evening., Disp: , Rfl:    fenofibrate 54  MG tablet, Take 1 tablet (54 mg total) by mouth daily., Disp: , Rfl:    finasteride (PROSCAR) 5 MG tablet, Take 1 tablet (5 mg total) by mouth daily., Disp: 90 tablet, Rfl: 3   Fluocinolone Acetonide Body 0.01 % OIL, Apply to scalp 1-2 times a day and leave on., Disp: 120 mL, Rfl: 3   fluocinonide ointment (LIDEX) 5.02 %, Apply 1 application topically 2 (two) times daily as needed (pruritus). (apply to legs), Disp: , Rfl:    fluticasone furoate-vilanterol (BREO ELLIPTA) 200-25 MCG/INH AEPB, Inhale 1 puff into the lungs daily., Disp: , Rfl:    gabapentin (NEURONTIN) 400 MG capsule, Take 400 mg by mouth daily., Disp: , Rfl:    gabapentin (NEURONTIN)  600 MG tablet, Take 600 mg by mouth at bedtime. (take with 100mg  capsules to equal 800mg  total), Disp: , Rfl:    guaiFENesin (ROBITUSSIN) 100 MG/5ML liquid, Take 300 mg by mouth every 6 (six) hours as needed for cough., Disp: , Rfl:    guaiFENesin-dextromethorphan (ROBITUSSIN DM) 100-10 MG/5ML syrup, Take 5 mLs by mouth every 4 (four) hours as needed for cough., Disp: 118 mL, Rfl: 0   ketoconazole (NIZORAL) 2 % shampoo, Massage into scalp daily and let sit several minutes before rinsing., Disp: 120 mL, Rfl: 4   lactulose (CHRONULAC) 10 GM/15ML solution, Take 30 mLs (20 g total) by mouth daily as needed for mild constipation., Disp: 120 mL, Rfl: 0   levothyroxine (SYNTHROID, LEVOTHROID) 50 MCG tablet, Take 50 mcg daily before breakfast by mouth., Disp: , Rfl:    losartan (COZAAR) 25 MG tablet, Take 25 mg by mouth daily., Disp: , Rfl:    methylPREDNISolone (MEDROL DOSEPAK) 4 MG TBPK tablet, Take as directed, Disp: 21 each, Rfl: 0   mirabegron ER (MYRBETRIQ) 50 MG TB24 tablet, Take 50 mg by mouth daily., Disp: , Rfl:    montelukast (SINGULAIR) 5 MG chewable tablet, Chew 5 mg by mouth at bedtime., Disp: , Rfl:    Multiple Vitamin (MULTI-VITAMINS) TABS, Take 1 tablet by mouth daily. , Disp: , Rfl:    nortriptyline (PAMELOR) 75 MG capsule, Take 150 mg by mouth daily. , Disp: , Rfl:    omeprazole (PRILOSEC) 40 MG capsule, Take 1 capsule (40 mg total) by mouth daily., Disp: 90 capsule, Rfl: 0   oxyCODONE-acetaminophen (PERCOCET) 5-325 MG tablet, Take 1 tablet by mouth every 4 (four) hours as needed for severe pain., Disp: 5 tablet, Rfl: 0   tamsulosin (FLOMAX) 0.4 MG CAPS capsule, Take 0.4 mg by mouth at bedtime., Disp: , Rfl:    tiZANidine (ZANAFLEX) 2 MG tablet, Take 2 mg by mouth at bedtime., Disp: , Rfl:    acetaminophen (TYLENOL) 325 MG tablet, Take 650 mg by mouth every 4 (four) hours as needed for mild pain. (Patient not taking: Reported on 12/07/2021), Disp: , Rfl:    acetaminophen (TYLENOL) 500 MG  tablet, Take 500 mg by mouth every 4 (four) hours as needed for moderate pain or mild pain. (Patient not taking: Reported on 12/07/2021), Disp: , Rfl:    fluocinonide (LIDEX) 0.05 % external solution, Apply 1 application topically 2 (two) times daily. (Patient not taking: Reported on 12/07/2021), Disp: , Rfl:    nystatin-triamcinolone (MYCOLOG II) cream, SMARTSIG:1 Topical Daily PRN, Disp: , Rfl:   Past Medical Problems: Past Medical History:  Diagnosis Date   Asthma    Basal cell carcinoma 07/04/2021   L nasal ala. EDC scheduled 08/02/21   BPH (benign prostatic hyperplasia)  Colon adenomas    COPD (chronic obstructive pulmonary disease) (HCC)    Dysplastic nevus 02/28/2021   R scapula, mod atypia   Ear pain    GERD (gastroesophageal reflux disease)    Headache    Hypercholesteremia    Hypertension    Hypothyroidism    Neuropathy    Sleep apnea    Sleep disorder     Past Surgical History: Past Surgical History:  Procedure Laterality Date   CHOLECYSTECTOMY     COLONOSCOPY     COLONOSCOPY WITH PROPOFOL N/A 08/01/2021   Procedure: COLONOSCOPY WITH PROPOFOL;  Surgeon: Jonathon Bellows, MD;  Location[facility] ENDOSCOPY;  Service: Gastroenterology;  Laterality: N/A;   ESOPHAGOGASTRODUODENOSCOPY (EGD) WITH PROPOFOL N/A 11/30/2021   Procedure: ESOPHAGOGASTRODUODENOSCOPY (EGD) WITH PROPOFOL;  Surgeon: Jonathon Bellows, MD;  Location: [facility] ENDOSCOPY;  Service: Gastroenterology;  Laterality: N/A;   HERNIA REPAIR     pituitary tumor excision      Social History: Social History   Socioeconomic History   Marital status: Single    Spouse name: Not on file   Number of children: Not on file   Years of education: Not on file   Highest education level: Not on file  Occupational History   Not on file  Tobacco Use   Smoking status: Never   Smokeless tobacco: Never  Vaping Use   Vaping Use: Never used  Substance and Sexual Activity   Alcohol use: No   Drug use: No   Sexual activity: Not on file   Other Topics Concern   Not on file  Social History Narrative   Not on file   Social Determinants of Health   Financial Resource Strain: Not on file  Food Insecurity: Not on file  Transportation Needs: Not on file  Physical Activity: Not on file  Stress: Not on file  Social Connections: Not on file  Intimate Partner Violence: Not on file    Family History: Family History  Problem Relation Age of Onset   Prostate cancer Neg Hx    Bladder Cancer Neg Hx    Kidney cancer Neg Hx     Review of Systems: ROS Denies recent fevers, chills, infection, chest pain, or shortness of breath.  Physical Exam: Vital Signs BP 133/76 (BP Location: Left Arm, Patient Position: Sitting, Cuff Size: Large)    Pulse 86    Ht 5\' 8"  (1.727 m)    Wt 205 lb (93 kg)    SpO2 97%    BMI 31.17 kg/m   Physical Exam Constitutional:      General: Not in acute distress.    Appearance: Normal appearance. Not ill-appearing.  HENT:     Head: Normocephalic and atraumatic.  Eyes:     Pupils: Pupils are equal, round. Cardiovascular:     Rate and Rhythm: Normal rate.    Pulses: Normal pulses.  Pulmonary:     Effort: No respiratory distress or increased work of breathing.  Speaks in full sentences. Abdominal:     General: Abdomen is flat. No distension.   Musculoskeletal: Normal range of motion.  CAM Walker right lower leg.  Left leg with 1+ pitting edema.  Scattered varicosities noted. Skin:    General: Skin is warm and dry.     Findings: No erythema or rash.  Neurological:     Mental Status: Alert and oriented to person, place, and time.  Psychiatric:        Mood and Affect: Mood normal.  Behavior: Behavior normal.    Assessment/Plan: The patient is scheduled for nasal reconstruction with Dr. Claudia Desanctis.  Risks, benefits, and alternatives of procedure discussed, questions answered and consent obtained.    Smoking Status: Non-smoker.    Caprini Score: 10; Risk Factors include: Age, BMI greater  than 25, lower extremity edema, varicosities, personal history of cancer (BCC), and length of planned surgery. Recommendation for mechanical prophylaxis.  Discussed with surgeon, no pharmacologic prophylaxis.  Encourage early ambulation (ambulatory with CAM walker).   Pictures obtained: Today.  Post-op Rx sent to pharmacy: Oxycodone 5 mg, Zofran.  Patient was provided with the General Surgical Risk consent document and Pain Medication Agreement prior to their appointment.  They had adequate time to read through the risk consent documents and Pain Medication Agreement. We also discussed them in person together during this preop appointment. All of their questions were answered to their satisfaction.  Recommended calling if they have any further questions.  Risk consent form and Pain Medication Agreement to be scanned into patient's chart.    Electronically signed by: Krista Blue, PA-C 12/07/2021 10:17 AM

## 2021-12-05 NOTE — H&P (View-Only) (Signed)
Patient ID: Dakota Harper, male    DOB: [birth year], 76 y.o.   MRN: [id number]  Chief Complaint  Patient presents with   Pre-op Exam      ICD-10-CM   1. Basal cell carcinoma of nose  C44.311        History of Present Illness: Dakota Harper is a 76 y.o.  male  with a history of basal cell carcinoma left alar groove.  He presents for preoperative evaluation for upcoming procedure, nasal reconstruction, scheduled for 12/19/2021 with Dr. Claudia Desanctis.  The patient has not had problems with anesthesia.  Patient takes 81 mg aspirin and has been doing so for years.  Denies any personal cardiac history, but does state that his father had a bypass surgery.  Patient states that his COPD is well controlled with Breo Ellipta and albuterol.  Thyroid well controlled.  Denies OSA.  No personal or family history of blood clots or clotting disorder.  He will hold any vitamins or over-the-counter supplements.  He currently resides in a nursing home.  Last week he was evaluated for ATFL sprain right ankle, denies any obvious trauma.  PCP is [name] at [facility].  He states that he goes for walks often and tries to keep up good health.  He reports that his only allergy is sulfa drugs.  Summary of Previous Visit: Patient was seen for initial consult 11/23/2021.  Plan is for Mohs excision with Dr. Hassell Done with expectation of reconstruction.  Discussed skin graft versus adjacent tissue transfer.  The precise form of reconstruction not yet determined as it will depend on the size and character of Mohs defect.  PMH Significant for: Basal cell carcinoma, HLD, COPD, GERD, thyroid disorder, BPH.   Past Medical History: Allergies: Allergies  Allergen Reactions   Saw Palmetto Hives and Rash    "Berry form"   Other Other (See Comments) and Rash   Sulfa Antibiotics Rash   Sulfacetamide Sodium Rash    Current Medications:  Current Outpatient Medications:    albuterol (PROVENTIL HFA;VENTOLIN HFA) 108  (90 BASE) MCG/ACT inhaler, Inhale 1 puff into the lungs 3 (three) times daily as needed for wheezing or shortness of breath., Disp: , Rfl:    alum & mag hydroxide-simeth (MAALOX/MYLANTA) 200-200-20 MG/5ML suspension, Take 30 mLs by mouth 4 (four) times daily as needed for indigestion or heartburn., Disp: , Rfl:    amLODipine (NORVASC) 5 MG tablet, Take 1 tablet by mouth daily., Disp: , Rfl:    aspirin EC 81 MG tablet, Take 81 mg by mouth at bedtime. , Disp: , Rfl:    atorvastatin (LIPITOR) 10 MG tablet, Take 10 mg by mouth every evening., Disp: , Rfl:    barrier cream (NON-SPECIFIED) CREA, Apply 1 application topically as needed (post-toileting skin protection)., Disp: , Rfl:    carbamide peroxide (EAR WAX REMOVAL DROPS) 6.5 % OTIC solution, Place 5 drops into both ears every Sunday., Disp: , Rfl:    chlorhexidine (PERIDEX) 0.12 % solution, Use as directed 15 mLs in the mouth or throat 2 (two) times daily as needed (oral discomfort)., Disp: , Rfl:    cycloSPORINE (RESTASIS) 0.05 % ophthalmic emulsion, Place 1 drop into both eyes 2 (two) times daily as needed (dry eyes)., Disp: , Rfl:    diclofenac Sodium (VOLTAREN) 1 % GEL, Apply topically., Disp: , Rfl:    famotidine (PEPCID) 40 MG tablet, Take 40 mg by mouth every evening., Disp: , Rfl:    fenofibrate 54  MG tablet, Take 1 tablet (54 mg total) by mouth daily., Disp: , Rfl:    finasteride (PROSCAR) 5 MG tablet, Take 1 tablet (5 mg total) by mouth daily., Disp: 90 tablet, Rfl: 3   Fluocinolone Acetonide Body 0.01 % OIL, Apply to scalp 1-2 times a day and leave on., Disp: 120 mL, Rfl: 3   fluocinonide ointment (LIDEX) 9.41 %, Apply 1 application topically 2 (two) times daily as needed (pruritus). (apply to legs), Disp: , Rfl:    fluticasone furoate-vilanterol (BREO ELLIPTA) 200-25 MCG/INH AEPB, Inhale 1 puff into the lungs daily., Disp: , Rfl:    gabapentin (NEURONTIN) 400 MG capsule, Take 400 mg by mouth daily., Disp: , Rfl:    gabapentin (NEURONTIN)  600 MG tablet, Take 600 mg by mouth at bedtime. (take with 100mg  capsules to equal 800mg  total), Disp: , Rfl:    guaiFENesin (ROBITUSSIN) 100 MG/5ML liquid, Take 300 mg by mouth every 6 (six) hours as needed for cough., Disp: , Rfl:    guaiFENesin-dextromethorphan (ROBITUSSIN DM) 100-10 MG/5ML syrup, Take 5 mLs by mouth every 4 (four) hours as needed for cough., Disp: 118 mL, Rfl: 0   ketoconazole (NIZORAL) 2 % shampoo, Massage into scalp daily and let sit several minutes before rinsing., Disp: 120 mL, Rfl: 4   lactulose (CHRONULAC) 10 GM/15ML solution, Take 30 mLs (20 g total) by mouth daily as needed for mild constipation., Disp: 120 mL, Rfl: 0   levothyroxine (SYNTHROID, LEVOTHROID) 50 MCG tablet, Take 50 mcg daily before breakfast by mouth., Disp: , Rfl:    losartan (COZAAR) 25 MG tablet, Take 25 mg by mouth daily., Disp: , Rfl:    methylPREDNISolone (MEDROL DOSEPAK) 4 MG TBPK tablet, Take as directed, Disp: 21 each, Rfl: 0   mirabegron ER (MYRBETRIQ) 50 MG TB24 tablet, Take 50 mg by mouth daily., Disp: , Rfl:    montelukast (SINGULAIR) 5 MG chewable tablet, Chew 5 mg by mouth at bedtime., Disp: , Rfl:    Multiple Vitamin (MULTI-VITAMINS) TABS, Take 1 tablet by mouth daily. , Disp: , Rfl:    nortriptyline (PAMELOR) 75 MG capsule, Take 150 mg by mouth daily. , Disp: , Rfl:    omeprazole (PRILOSEC) 40 MG capsule, Take 1 capsule (40 mg total) by mouth daily., Disp: 90 capsule, Rfl: 0   oxyCODONE-acetaminophen (PERCOCET) 5-325 MG tablet, Take 1 tablet by mouth every 4 (four) hours as needed for severe pain., Disp: 5 tablet, Rfl: 0   tamsulosin (FLOMAX) 0.4 MG CAPS capsule, Take 0.4 mg by mouth at bedtime., Disp: , Rfl:    tiZANidine (ZANAFLEX) 2 MG tablet, Take 2 mg by mouth at bedtime., Disp: , Rfl:    acetaminophen (TYLENOL) 325 MG tablet, Take 650 mg by mouth every 4 (four) hours as needed for mild pain. (Patient not taking: Reported on 12/07/2021), Disp: , Rfl:    acetaminophen (TYLENOL) 500 MG  tablet, Take 500 mg by mouth every 4 (four) hours as needed for moderate pain or mild pain. (Patient not taking: Reported on 12/07/2021), Disp: , Rfl:    fluocinonide (LIDEX) 0.05 % external solution, Apply 1 application topically 2 (two) times daily. (Patient not taking: Reported on 12/07/2021), Disp: , Rfl:    nystatin-triamcinolone (MYCOLOG II) cream, SMARTSIG:1 Topical Daily PRN, Disp: , Rfl:   Past Medical Problems: Past Medical History:  Diagnosis Date   Asthma    Basal cell carcinoma 07/04/2021   L nasal ala. EDC scheduled 08/02/21   BPH (benign prostatic hyperplasia)  Colon adenomas    COPD (chronic obstructive pulmonary disease) (HCC)    Dysplastic nevus 02/28/2021   R scapula, mod atypia   Ear pain    GERD (gastroesophageal reflux disease)    Headache    Hypercholesteremia    Hypertension    Hypothyroidism    Neuropathy    Sleep apnea    Sleep disorder     Past Surgical History: Past Surgical History:  Procedure Laterality Date   CHOLECYSTECTOMY     COLONOSCOPY     COLONOSCOPY WITH PROPOFOL N/A 08/01/2021   Procedure: COLONOSCOPY WITH PROPOFOL;  Surgeon: Jonathon Bellows, MD;  Location: [facility] ENDOSCOPY;  Service: Gastroenterology;  Laterality: N/A;   ESOPHAGOGASTRODUODENOSCOPY (EGD) WITH PROPOFOL N/A 11/30/2021   Procedure: ESOPHAGOGASTRODUODENOSCOPY (EGD) WITH PROPOFOL;  Surgeon: Jonathon Bellows, MD;  Location: [facility] ENDOSCOPY;  Service: Gastroenterology;  Laterality: N/A;   HERNIA REPAIR     pituitary tumor excision      Social History: Social History   Socioeconomic History   Marital status: Single    Spouse name: Not on file   Number of children: Not on file   Years of education: Not on file   Highest education level: Not on file  Occupational History   Not on file  Tobacco Use   Smoking status: Never   Smokeless tobacco: Never  Vaping Use   Vaping Use: Never used  Substance and Sexual Activity   Alcohol use: No   Drug use: No   Sexual activity: Not on file   Other Topics Concern   Not on file  Social History Narrative   Not on file   Social Determinants of Health   Financial Resource Strain: Not on file  Food Insecurity: Not on file  Transportation Needs: Not on file  Physical Activity: Not on file  Stress: Not on file  Social Connections: Not on file  Intimate Partner Violence: Not on file    Family History: Family History  Problem Relation Age of Onset   Prostate cancer Neg Hx    Bladder Cancer Neg Hx    Kidney cancer Neg Hx     Review of Systems: ROS Denies recent fevers, chills, infection, chest pain, or shortness of breath.  Physical Exam: Vital Signs BP 133/76 (BP Location: Left Arm, Patient Position: Sitting, Cuff Size: Large)    Pulse 86    Ht 5\' 8"  (1.727 m)    Wt 205 lb (93 kg)    SpO2 97%    BMI 31.17 kg/m   Physical Exam Constitutional:      General: Not in acute distress.    Appearance: Normal appearance. Not ill-appearing.  HENT:     Head: Normocephalic and atraumatic.  Eyes:     Pupils: Pupils are equal, round. Cardiovascular:     Rate and Rhythm: Normal rate.    Pulses: Normal pulses.  Pulmonary:     Effort: No respiratory distress or increased work of breathing.  Speaks in full sentences. Abdominal:     General: Abdomen is flat. No distension.   Musculoskeletal: Normal range of motion.  CAM Walker right lower leg.  Left leg with 1+ pitting edema.  Scattered varicosities noted. Skin:    General: Skin is warm and dry.     Findings: No erythema or rash.  Neurological:     Mental Status: Alert and oriented to person, place, and time.  Psychiatric:        Mood and Affect: Mood normal.  Behavior: Behavior normal.    Assessment/Plan: The patient is scheduled for nasal reconstruction with Dr. Claudia Desanctis.  Risks, benefits, and alternatives of procedure discussed, questions answered and consent obtained.    Smoking Status: Non-smoker.    Caprini Score: 10; Risk Factors include: Age, BMI greater  than 25, lower extremity edema, varicosities, personal history of cancer (BCC), and length of planned surgery. Recommendation for mechanical prophylaxis.  Discussed with surgeon, no pharmacologic prophylaxis.  Encourage early ambulation (ambulatory with CAM walker).   Pictures obtained: Today.  Post-op Rx sent to pharmacy: Oxycodone 5 mg, Zofran.  Patient was provided with the General Surgical Risk consent document and Pain Medication Agreement prior to their appointment.  They had adequate time to read through the risk consent documents and Pain Medication Agreement. We also discussed them in person together during this preop appointment. All of their questions were answered to their satisfaction.  Recommended calling if they have any further questions.  Risk consent form and Pain Medication Agreement to be scanned into patient's chart.    Electronically signed by: Krista Blue, PA-C 12/07/2021 10:17 AM

## 2021-12-06 ENCOUNTER — Encounter: Payer: Self-pay | Admitting: Podiatry

## 2021-12-06 ENCOUNTER — Ambulatory Visit (INDEPENDENT_AMBULATORY_CARE_PROVIDER_SITE_OTHER): Payer: 59

## 2021-12-06 ENCOUNTER — Other Ambulatory Visit: Payer: Self-pay | Admitting: Sports Medicine

## 2021-12-06 ENCOUNTER — Telehealth: Payer: Self-pay | Admitting: Podiatry

## 2021-12-06 ENCOUNTER — Other Ambulatory Visit: Payer: Self-pay

## 2021-12-06 ENCOUNTER — Ambulatory Visit (INDEPENDENT_AMBULATORY_CARE_PROVIDER_SITE_OTHER): Payer: 59 | Admitting: Podiatry

## 2021-12-06 DIAGNOSIS — S93491A Sprain of other ligament of right ankle, initial encounter: Secondary | ICD-10-CM | POA: Diagnosis not present

## 2021-12-06 MED ORDER — METHYLPREDNISOLONE 4 MG PO TBPK: ORAL_TABLET | ORAL | 0 refills | Status: DC

## 2021-12-06 MED ORDER — OXYCODONE-ACETAMINOPHEN 5-325 MG PO TABS: 1.0000 | ORAL_TABLET | ORAL | 0 refills | Status: DC | PRN

## 2021-12-06 MED ORDER — OXYCODONE-ACETAMINOPHEN 5-325 MG PO TABS
1.0000 | ORAL_TABLET | ORAL | 0 refills | Status: DC | PRN
Start: 2021-12-06 — End: 2021-12-06

## 2021-12-06 NOTE — Telephone Encounter (Signed)
Lauren from the pharmacy called stating this patient is in a facility nd was sent a RX for percocet. Per Ander Purpura they need it sent to [facility].

## 2021-12-06 NOTE — Progress Notes (Signed)
Subjective:  Patient ID: Dakota Harper, male    DOB: 1946/01/17,  MRN: [id number]  Chief Complaint  Patient presents with   Foot Pain    Right ankle pain     76 y.o. male presents with the above complaint.  Patient presents with right ankle pain that came out of nowhere.  Patient states been going last Wednesday.  He did not get red hot swollen.  He does not have any history of gout.  He tried icing and heating it which has not helped.  He wanted get it evaluated.  He is known to Dr. Prudence Davidson for regular toenail trimming.  He denies any other acute issues his pain scale is 20 out of 10.  Sharp shooting in nature.  Patient states that he has recently walked on uneven surfaces between grass and concrete thinker led to the etiology of the pain.   Review of Systems: Negative except as noted in the HPI. Denies N/V/F/Ch.  Past Medical History:  Diagnosis Date   Asthma    Basal cell carcinoma 07/04/2021   L nasal ala. 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   R scapula, mod atypia   Ear pain    GERD (gastroesophageal reflux disease)    Headache    Hypercholesteremia    Hypertension    Hypothyroidism    Neuropathy    Sleep apnea    Sleep disorder     Current Outpatient Medications:    methylPREDNISolone (MEDROL DOSEPAK) 4 MG TBPK tablet, Take as directed, Disp: 21 each, Rfl: 0   oxyCODONE-acetaminophen (PERCOCET) 5-325 MG tablet, Take 1 tablet by mouth every 4 (four) hours as needed for severe pain., Disp: 5 tablet, Rfl: 0   acetaminophen (TYLENOL) 325 MG tablet, Take 650 mg by mouth every 4 (four) hours as needed for mild pain., Disp: , Rfl:    acetaminophen (TYLENOL) 500 MG tablet, Take 500 mg by mouth every 4 (four) hours as needed for moderate pain or mild pain., Disp: , Rfl:    albuterol (PROVENTIL HFA;VENTOLIN HFA) 108 (90 BASE) MCG/ACT inhaler, Inhale 1  puff into the lungs 3 (three) times daily as needed for wheezing or shortness of breath., Disp: , Rfl:    alum & mag hydroxide-simeth (MAALOX/MYLANTA) 200-200-20 MG/5ML suspension, Take 30 mLs by mouth 4 (four) times daily as needed for indigestion or heartburn., Disp: , Rfl:    amLODipine (NORVASC) 5 MG tablet, Take 1 tablet by mouth daily., Disp: , Rfl:    aspirin EC 81 MG tablet, Take 81 mg by mouth at bedtime. , Disp: , Rfl:    atorvastatin (LIPITOR) 10 MG tablet, Take 10 mg by mouth every evening., Disp: , Rfl:    barrier cream (NON-SPECIFIED) CREA, Apply 1 application topically as needed (post-toileting skin protection)., Disp: , Rfl:    carbamide peroxide (EAR WAX REMOVAL DROPS) 6.5 % OTIC solution, Place 5 drops into both ears every Sunday., Disp: , Rfl:    chlorhexidine (PERIDEX) 0.12 % solution, Use as directed 15 mLs in the mouth or throat 2 (two) times daily as needed (oral discomfort)., Disp: , Rfl:    cycloSPORINE (RESTASIS) 0.05 % ophthalmic emulsion, Place 1 drop into both eyes 2 (two) times daily as needed (dry eyes)., Disp: , Rfl:    diclofenac Sodium (VOLTAREN) 1 % GEL, Apply topically., Disp: , Rfl:    famotidine (  PEPCID) 40 MG tablet, Take 40 mg by mouth every evening., Disp: , Rfl:    fenofibrate 54 MG tablet, Take 1 tablet (54 mg total) by mouth daily., Disp: , Rfl:    finasteride (PROSCAR) 5 MG tablet, Take 1 tablet (5 mg total) by mouth daily., Disp: 90 tablet, Rfl: 3   Fluocinolone Acetonide Body 0.01 % OIL, Apply to scalp 1-2 times a day and leave on., Disp: 120 mL, Rfl: 3   fluocinonide (LIDEX) 0.05 % external solution, Apply 1 application topically 2 (two) times daily., Disp: , Rfl:    fluocinonide ointment (LIDEX) 7.91 %, Apply 1 application topically 2 (two) times daily as needed (pruritus). (apply to legs), Disp: , Rfl:    fluticasone furoate-vilanterol (BREO ELLIPTA) 200-25 MCG/INH AEPB, Inhale 1 puff into the lungs daily., Disp: , Rfl:    gabapentin (NEURONTIN) 400  MG capsule, Take 400 mg by mouth daily., Disp: , Rfl:    gabapentin (NEURONTIN) 600 MG tablet, Take 600 mg by mouth at bedtime. (take with 100mg  capsules to equal 800mg  total), Disp: , Rfl:    guaiFENesin (ROBITUSSIN) 100 MG/5ML liquid, Take 300 mg by mouth every 6 (six) hours as needed for cough., Disp: , Rfl:    guaiFENesin-dextromethorphan (ROBITUSSIN DM) 100-10 MG/5ML syrup, Take 5 mLs by mouth every 4 (four) hours as needed for cough., Disp: 118 mL, Rfl: 0   ketoconazole (NIZORAL) 2 % shampoo, Massage into scalp daily and let sit several minutes before rinsing., Disp: 120 mL, Rfl: 4   lactulose (CHRONULAC) 10 GM/15ML solution, Take 30 mLs (20 g total) by mouth daily as needed for mild constipation., Disp: 120 mL, Rfl: 0   levothyroxine (SYNTHROID, LEVOTHROID) 50 MCG tablet, Take 50 mcg daily before breakfast by mouth., Disp: , Rfl:    losartan (COZAAR) 25 MG tablet, Take 25 mg by mouth daily., Disp: , Rfl:    mirabegron ER (MYRBETRIQ) 50 MG TB24 tablet, Take 50 mg by mouth daily., Disp: , Rfl:    montelukast (SINGULAIR) 5 MG chewable tablet, Chew 5 mg by mouth at bedtime., Disp: , Rfl:    Multiple Vitamin (MULTI-VITAMINS) TABS, Take 1 tablet by mouth daily. , Disp: , Rfl:    nortriptyline (PAMELOR) 75 MG capsule, Take 150 mg by mouth daily. , Disp: , Rfl:    nystatin-triamcinolone (MYCOLOG II) cream, SMARTSIG:1 Topical Daily PRN, Disp: , Rfl:    omeprazole (PRILOSEC) 40 MG capsule, Take 1 capsule (40 mg total) by mouth daily., Disp: 90 capsule, Rfl: 0   tamsulosin (FLOMAX) 0.4 MG CAPS capsule, Take 0.4 mg by mouth at bedtime., Disp: , Rfl:    tiZANidine (ZANAFLEX) 2 MG tablet, Take 2 mg by mouth at bedtime., Disp: , Rfl:   Social History   Tobacco Use  Smoking Status Never  Smokeless Tobacco Never    Allergies  Allergen Reactions   Saw Palmetto Hives and Rash    "Berry form"   Other Other (See Comments) and Rash   Sulfa Antibiotics Rash   Sulfacetamide Sodium Rash   Objective:   There were no vitals filed for this visit. There is no height or weight on file to calculate BMI. Constitutional Well developed. Well nourished.  Vascular Dorsalis pedis pulses palpable bilaterally. Posterior tibial pulses palpable bilaterally. Capillary refill normal to all digits.  No cyanosis or clubbing noted. Pedal hair growth normal.  Neurologic Normal speech. Oriented to person, place, and time. Epicritic sensation to light touch grossly present bilaterally.  Dermatologic Nails well groomed  and normal in appearance. No open wounds. No skin lesions.  Orthopedic: Pain on palpation to the ATFL ligament.  No pain at the peroneal tendon, Achilles tendon, posterior tibial tendon.  No pain with range of motion of the ankle joint.  No pain at the talonavicular joint.   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  Osteoarthritic changes mild to moderate in nature of the ankle joint.  No other bony abnormalities identified. Assessment:   1. Sprain of anterior talofibular ligament of right ankle, initial encounter    Plan:  Patient was evaluated and treated and all questions answered.  Right mild to moderate ankle sprain with walking on uneven surface -All questions and concerns were discussed with the patient in extensive detail -Given the amount of pain that is having he will benefit from cam boot immobilization to allow the soft tissue structures to heal appropriately.  He states understanding.  If there is still residual pain we will discuss steroid injection during next clinical visit -Medrol Dosepak was sent to the pharmacy -5 tablets of Percocet was sent for pain control.  No follow-ups on file.

## 2021-12-06 NOTE — Progress Notes (Signed)
Rx changed to correct pharmacy

## 2021-12-07 ENCOUNTER — Ambulatory Visit (INDEPENDENT_AMBULATORY_CARE_PROVIDER_SITE_OTHER): Payer: 59 | Admitting: Physician Assistant

## 2021-12-07 ENCOUNTER — Telehealth: Payer: Self-pay

## 2021-12-07 ENCOUNTER — Other Ambulatory Visit: Payer: Self-pay

## 2021-12-07 ENCOUNTER — Encounter: Payer: Self-pay | Admitting: Physician Assistant

## 2021-12-07 VITALS — BP 133/76 | HR 86 | Ht 68.0 in | Wt 205.0 lb

## 2021-12-07 DIAGNOSIS — C44311 Basal cell carcinoma of skin of nose: Secondary | ICD-10-CM

## 2021-12-07 MED ORDER — ONDANSETRON 4 MG PO TBDP: 4.0000 mg | ORAL_TABLET | Freq: Three times a day (TID) | ORAL | 0 refills | Status: DC | PRN

## 2021-12-07 MED ORDER — OXYCODONE HCL 5 MG PO TABS: 5.0000 mg | ORAL_TABLET | Freq: Four times a day (QID) | ORAL | 0 refills | Status: AC | PRN

## 2021-12-07 NOTE — Telephone Encounter (Signed)
Faxed Medical/Surgical Clearance form to [name], NP. Inquire if patient can hold off ASA 5-7 prior to surgery.

## 2021-12-08 ENCOUNTER — Encounter: Payer: Self-pay | Admitting: Physician Assistant

## 2021-12-08 NOTE — Progress Notes (Signed)
Received medical clearance from patient's primary care provider, Alroy Dust FNP, for his upcoming surgery.  He may hold his aspirin 5 to 7 days prior to surgery.  We appreciate her prompt response and will call to advise patient.

## 2021-12-12 ENCOUNTER — Ambulatory Visit (INDEPENDENT_AMBULATORY_CARE_PROVIDER_SITE_OTHER): Payer: 59 | Admitting: Podiatry

## 2021-12-12 ENCOUNTER — Other Ambulatory Visit: Payer: Self-pay

## 2021-12-12 ENCOUNTER — Encounter: Payer: Self-pay | Admitting: Podiatry

## 2021-12-12 DIAGNOSIS — M10471 Other secondary gout, right ankle and foot: Secondary | ICD-10-CM | POA: Diagnosis not present

## 2021-12-12 MED ORDER — COLCHICINE 0.6 MG PO TABS: ORAL_TABLET | ORAL | 2 refills | Status: DC

## 2021-12-12 NOTE — Progress Notes (Signed)
°  Subjective:  Patient ID: Dakota Harper, male    DOB: 06-17-1946,  MRN: [id number]  Chief Complaint  Patient presents with   Foot Pain    "It's terrible."    76 y.o. male presents with the above complaint. History confirmed with patient.  Saw Dr. Posey Pronto last week and was given prednisone taper he says this has helped some but it still is quite painful.  He has been walking in the cam boot.  Objective:  Physical Exam: warm, good capillary refill, no trophic changes or ulcerative lesions, normal DP and PT pulses, and normal sensory exam.  Right Foot: Pain with palpation of the anterior ankle joint line with some edema here and with range of motion  Assessment:   1. Other secondary acute gout of right ankle      Plan:  Patient was evaluated and treated and all questions answered.  Suspect this likely is gout flare.  He has no history of trauma or injury.  I recommend supplementing the prednisone with a direct corticosteroid injection which we performed today following sterile prep with Betadine 1/2 cc of lidocaine 2% and 0.5% Marcaine 10 mg of Kenalog and 5 mg of dexamethasone phosphate were injected directly into the right ankle through a lateral approach.  He tolerated this procedure well.  I also recommended colchicine Rx and I sent this to his pharmacy.  Discussed if not improving after all this would consider MRI of the ankle.  Return if symptoms worsen or fail to improve.

## 2021-12-15 ENCOUNTER — Telehealth: Payer: Self-pay | Admitting: Podiatry

## 2021-12-15 ENCOUNTER — Encounter (HOSPITAL_COMMUNITY): Payer: Self-pay | Admitting: Plastic Surgery

## 2021-12-15 ENCOUNTER — Other Ambulatory Visit: Payer: Self-pay

## 2021-12-15 NOTE — Telephone Encounter (Signed)
Patient called asking for RX of pain medication refill. Patient number is [phone]. Pt is in a fcility

## 2021-12-15 NOTE — Progress Notes (Signed)
Spoke with pt for pre-op call. Pt lives at [facility], an assisted living facility. He states that their transportation will be bringing him and picking him up on Monday.  Pt has hx of Hypertension. Denies cardiac history or Diabetes. Pt was instructed to hold his Aspirin 5 - 7 days. Last dose was today.   Pt's surgery is scheduled as ambulatory so no Covid test is required prior to surgery.

## 2021-12-15 NOTE — Telephone Encounter (Signed)
Called pt LM on VM relayed message to him from Dr Sherryle Lis in reference to pain medication refill.

## 2021-12-18 NOTE — Anesthesia Preprocedure Evaluation (Addendum)
Anesthesia Evaluation  Patient identified by MRN, date of birth, ID band Patient awake    Reviewed: Allergy & Precautions, NPO status , Patient's Chart, lab work & pertinent test results  History of Anesthesia Complications Negative for: history of anesthetic complications  Airway Mallampati: II  TM Distance: >3 FB Neck ROM: Full    Dental  (+) Dental Advisory Given, Edentulous Upper   Pulmonary asthma , sleep apnea , COPD,  COPD inhaler, former smoker,    Pulmonary exam normal        Cardiovascular hypertension, Pt. on medications Normal cardiovascular exam     Neuro/Psych  Headaches, PSYCHIATRIC DISORDERS Anxiety Depression  Hx SI and self-injurious behavior  Neuromuscular disease    GI/Hepatic Neg liver ROS, GERD  Controlled and Medicated,  Endo/Other  Hypothyroidism  Obesity   Renal/GU CRFRenal disease     Musculoskeletal  (+) Arthritis ,  Gout     Abdominal   Peds  Hematology negative hematology ROS (+)   Anesthesia Other Findings   Reproductive/Obstetrics                            Anesthesia Physical Anesthesia Plan  ASA: 3  Anesthesia Plan: General   Post-op Pain Management: Tylenol PO (pre-op)   Induction: Intravenous  PONV Risk Score and Plan: 3 and Treatment may vary due to age or medical condition, Ondansetron and Dexamethasone  Airway Management Planned: Oral ETT  Additional Equipment: None  Intra-op Plan:   Post-operative Plan: Extubation in OR  Informed Consent: I have reviewed the patients History and Physical, chart, labs and discussed the procedure including the risks, benefits and alternatives for the proposed anesthesia with the patient or authorized representative who has indicated his/her understanding and acceptance.     Dental advisory given  Plan Discussed with: CRNA and Anesthesiologist  Anesthesia Plan Comments:        Anesthesia  Quick Evaluation

## 2021-12-19 ENCOUNTER — Ambulatory Visit (HOSPITAL_COMMUNITY): Payer: 59 | Admitting: Anesthesiology

## 2021-12-19 ENCOUNTER — Other Ambulatory Visit: Payer: Self-pay

## 2021-12-19 ENCOUNTER — Encounter (HOSPITAL_COMMUNITY): Payer: Self-pay | Admitting: Plastic Surgery

## 2021-12-19 ENCOUNTER — Ambulatory Visit (HOSPITAL_COMMUNITY)
Admission: RE | Admit: 2021-12-19 | Discharge: 2021-12-19 | Disposition: A | Discharge status: Home / Self Care | Payer: 59 | Source: Ambulatory Visit | Attending: Plastic Surgery | Admitting: Plastic Surgery

## 2021-12-19 ENCOUNTER — Encounter (HOSPITAL_COMMUNITY)
Admission: RE | Disposition: A | Discharge status: Home / Self Care | Payer: Self-pay | Source: Ambulatory Visit | Attending: Plastic Surgery

## 2021-12-19 ENCOUNTER — Ambulatory Visit (HOSPITAL_BASED_OUTPATIENT_CLINIC_OR_DEPARTMENT_OTHER): Payer: 59 | Admitting: Anesthesiology

## 2021-12-19 DIAGNOSIS — Z6831 Body mass index (BMI) 31.0-31.9, adult: Secondary | ICD-10-CM | POA: Diagnosis not present

## 2021-12-19 DIAGNOSIS — I129 Hypertensive chronic kidney disease with stage 1 through stage 4 chronic kidney disease, or unspecified chronic kidney disease: Secondary | ICD-10-CM | POA: Diagnosis not present

## 2021-12-19 DIAGNOSIS — E039 Hypothyroidism, unspecified: Secondary | ICD-10-CM | POA: Insufficient documentation

## 2021-12-19 DIAGNOSIS — J449 Chronic obstructive pulmonary disease, unspecified: Secondary | ICD-10-CM | POA: Insufficient documentation

## 2021-12-19 DIAGNOSIS — C44311 Basal cell carcinoma of skin of nose: Secondary | ICD-10-CM

## 2021-12-19 DIAGNOSIS — N189 Chronic kidney disease, unspecified: Secondary | ICD-10-CM

## 2021-12-19 DIAGNOSIS — Z428 Encounter for other plastic and reconstructive surgery following medical procedure or healed injury: Secondary | ICD-10-CM

## 2021-12-19 DIAGNOSIS — F419 Anxiety disorder, unspecified: Secondary | ICD-10-CM | POA: Insufficient documentation

## 2021-12-19 DIAGNOSIS — F32A Depression, unspecified: Secondary | ICD-10-CM | POA: Diagnosis not present

## 2021-12-19 DIAGNOSIS — I10 Essential (primary) hypertension: Secondary | ICD-10-CM | POA: Diagnosis not present

## 2021-12-19 DIAGNOSIS — G709 Myoneural disorder, unspecified: Secondary | ICD-10-CM | POA: Insufficient documentation

## 2021-12-19 DIAGNOSIS — E669 Obesity, unspecified: Secondary | ICD-10-CM | POA: Diagnosis not present

## 2021-12-19 DIAGNOSIS — K219 Gastro-esophageal reflux disease without esophagitis: Secondary | ICD-10-CM | POA: Insufficient documentation

## 2021-12-19 DIAGNOSIS — Z85828 Personal history of other malignant neoplasm of skin: Secondary | ICD-10-CM | POA: Insufficient documentation

## 2021-12-19 HISTORY — PX: ADJACENT TISSUE TRANSFER/TISSUE REARRANGEMENT: SHX6829

## 2021-12-19 HISTORY — DX: Depression, unspecified: F32.A

## 2021-12-19 HISTORY — DX: Pneumonia, unspecified organism: J18.9

## 2021-12-19 HISTORY — DX: Gout, unspecified: M10.9

## 2021-12-19 HISTORY — DX: Unspecified osteoarthritis, unspecified site: M19.90

## 2021-12-19 HISTORY — DX: COVID-19: U07.1

## 2021-12-19 HISTORY — DX: Personal history of urinary calculi: Z87.442

## 2021-12-19 HISTORY — DX: Anxiety disorder, unspecified: F41.9

## 2021-12-19 HISTORY — PX: NASAL RECONSTRUCTION: SHX2069

## 2021-12-19 LAB — CBC
HCT: 41.7 % (ref 39.0–52.0)
Hemoglobin: 14.2 g/dL (ref 13.0–17.0)
MCH: 32 pg (ref 26.0–34.0)
MCHC: 34.1 g/dL (ref 30.0–36.0)
MCV: 93.9 fL (ref 80.0–100.0)
Platelets: 158 10*3/uL (ref 150–400)
RBC: 4.44 MIL/uL (ref 4.22–5.81)
RDW: 13.7 % (ref 11.5–15.5)
WBC: 8.4 10*3/uL (ref 4.0–10.5)
nRBC: 0 % (ref 0.0–0.2)

## 2021-12-19 LAB — BASIC METABOLIC PANEL
Anion gap: 10 (ref 5–15)
BUN: 27 mg/dL — ABNORMAL HIGH (ref 8–23)
CO2: 23 mmol/L (ref 22–32)
Calcium: 9.1 mg/dL (ref 8.9–10.3)
Chloride: 103 mmol/L (ref 98–111)
Creatinine, Ser: 1.45 mg/dL — ABNORMAL HIGH (ref 0.61–1.24)
GFR, Estimated: 50 mL/min — ABNORMAL LOW (ref >60)
Glucose, Bld: 85 mg/dL (ref 70–99)
Potassium: 5.2 mmol/L — ABNORMAL HIGH (ref 3.5–5.1)
Sodium: 136 mmol/L (ref 135–145)

## 2021-12-19 SURGERY — RECONSTRUCTION, NOSE
Anesthesia: General | Site: Nose | Laterality: Left

## 2021-12-19 MED ORDER — SODIUM BICARBONATE 4.2 % IV SOLN
INTRAVENOUS | Status: DC
  Filled 2021-12-19: qty 50

## 2021-12-19 MED ORDER — KETOROLAC TROMETHAMINE 0.5 % OP SOLN
OPHTHALMIC | Status: AC
  Administered 2021-12-19: 1 [drp] via OPHTHALMIC
  Filled 2021-12-19: qty 5

## 2021-12-19 MED ORDER — CHLORHEXIDINE GLUCONATE CLOTH 2 % EX PADS: 6.0000 | MEDICATED_PAD | Freq: Once | CUTANEOUS | Status: DC

## 2021-12-19 MED ORDER — DEXAMETHASONE SODIUM PHOSPHATE 10 MG/ML IJ SOLN
INTRAMUSCULAR | Status: DC | PRN
  Administered 2021-12-19: 10 mg via INTRAVENOUS

## 2021-12-19 MED ORDER — ONDANSETRON HCL 4 MG/2ML IJ SOLN: 4.0000 mg | Freq: Once | INTRAMUSCULAR | Status: DC | PRN

## 2021-12-19 MED ORDER — OXYCODONE HCL 5 MG PO TABS: 5.0000 mg | ORAL_TABLET | Freq: Once | ORAL | Status: DC | PRN

## 2021-12-19 MED ORDER — ONDANSETRON HCL 4 MG/2ML IJ SOLN
INTRAMUSCULAR | Status: DC | PRN
  Administered 2021-12-19: 4 mg via INTRAVENOUS

## 2021-12-19 MED ORDER — OXYCODONE HCL 5 MG/5ML PO SOLN: 5.0000 mg | Freq: Once | ORAL | Status: DC | PRN

## 2021-12-19 MED ORDER — PROPOFOL 10 MG/ML IV BOLUS
INTRAVENOUS | Status: AC
  Filled 2021-12-19: qty 20

## 2021-12-19 MED ORDER — LIDOCAINE 2% (20 MG/ML) 5 ML SYRINGE
INTRAMUSCULAR | Status: DC | PRN
  Administered 2021-12-19: 60 mg via INTRAVENOUS

## 2021-12-19 MED ORDER — ORAL CARE MOUTH RINSE: 15.0000 mL | Freq: Once | OROMUCOSAL | Status: AC

## 2021-12-19 MED ORDER — ALBUTEROL SULFATE HFA 108 (90 BASE) MCG/ACT IN AERS
INHALATION_SPRAY | RESPIRATORY_TRACT | Status: DC | PRN
Start: 2021-12-19 — End: 2021-12-19
  Administered 2021-12-19: 2 via RESPIRATORY_TRACT

## 2021-12-19 MED ORDER — KETOROLAC TROMETHAMINE 0.5 % OP SOLN: 1.0000 [drp] | Freq: Once | OPHTHALMIC | Status: AC

## 2021-12-19 MED ORDER — LACTATED RINGERS IV SOLN: INTRAVENOUS | Status: DC

## 2021-12-19 MED ORDER — LIDOCAINE-EPINEPHRINE 1 %-1:100000 IJ SOLN
INTRAMUSCULAR | Status: AC
  Filled 2021-12-19: qty 1

## 2021-12-19 MED ORDER — SUGAMMADEX SODIUM 200 MG/2ML IV SOLN
INTRAVENOUS | Status: DC | PRN
Start: 2021-12-19 — End: 2021-12-19
  Administered 2021-12-19: 372 mg via INTRAVENOUS

## 2021-12-19 MED ORDER — ROCURONIUM BROMIDE 10 MG/ML (PF) SYRINGE
PREFILLED_SYRINGE | INTRAVENOUS | Status: DC | PRN
Start: 2021-12-19 — End: 2021-12-19
  Administered 2021-12-19: 50 mg via INTRAVENOUS

## 2021-12-19 MED ORDER — CEFAZOLIN SODIUM-DEXTROSE 2-4 GM/100ML-% IV SOLN
2.0000 g | INTRAVENOUS | Status: AC
  Administered 2021-12-19: 2 g via INTRAVENOUS
  Filled 2021-12-19: qty 100

## 2021-12-19 MED ORDER — FENTANYL CITRATE (PF) 250 MCG/5ML IJ SOLN
INTRAMUSCULAR | Status: DC | PRN
  Administered 2021-12-19: 50 ug via INTRAVENOUS

## 2021-12-19 MED ORDER — CHLORHEXIDINE GLUCONATE 0.12 % MT SOLN
15.0000 mL | Freq: Once | OROMUCOSAL | Status: AC
  Administered 2021-12-19: 15 mL via OROMUCOSAL
  Filled 2021-12-19: qty 15

## 2021-12-19 MED ORDER — BSS IO SOLN
INTRAOCULAR | Status: AC
  Administered 2021-12-19: 15 mL
  Filled 2021-12-19: qty 15

## 2021-12-19 MED ORDER — BSS IO SOLN: 15.0000 mL | Freq: Once | INTRAOCULAR | Status: AC

## 2021-12-19 MED ORDER — FENTANYL CITRATE (PF) 100 MCG/2ML IJ SOLN: 25.0000 ug | INTRAMUSCULAR | Status: DC | PRN

## 2021-12-19 MED ORDER — PHENYLEPHRINE 40 MCG/ML (10ML) SYRINGE FOR IV PUSH (FOR BLOOD PRESSURE SUPPORT)
PREFILLED_SYRINGE | INTRAVENOUS | Status: DC | PRN
Start: 2021-12-19 — End: 2021-12-19
  Administered 2021-12-19: 160 ug via INTRAVENOUS
  Administered 2021-12-19: 120 ug via INTRAVENOUS

## 2021-12-19 MED ORDER — LIDOCAINE-EPINEPHRINE 1 %-1:100000 IJ SOLN
INTRAMUSCULAR | Status: DC | PRN
  Administered 2021-12-19: 6 mL

## 2021-12-19 MED ORDER — 0.9 % SODIUM CHLORIDE (POUR BTL) OPTIME
TOPICAL | Status: DC | PRN
  Administered 2021-12-19: 1000 mL

## 2021-12-19 MED ORDER — PROPOFOL 10 MG/ML IV BOLUS
INTRAVENOUS | Status: DC | PRN
Start: 2021-12-19 — End: 2021-12-19
  Administered 2021-12-19: 150 mg via INTRAVENOUS

## 2021-12-19 MED ORDER — FENTANYL CITRATE (PF) 250 MCG/5ML IJ SOLN
INTRAMUSCULAR | Status: AC
  Filled 2021-12-19: qty 5

## 2021-12-19 MED ORDER — BACITRACIN ZINC 500 UNIT/GM EX OINT
TOPICAL_OINTMENT | CUTANEOUS | Status: DC | PRN
Start: 2021-12-19 — End: 2021-12-19
  Administered 2021-12-19: 1 via TOPICAL

## 2021-12-19 MED ORDER — ACETAMINOPHEN 500 MG PO TABS
1000.0000 mg | ORAL_TABLET | Freq: Once | ORAL | Status: AC
  Administered 2021-12-19: 1000 mg via ORAL
  Filled 2021-12-19: qty 2

## 2021-12-19 MED ORDER — BACITRACIN ZINC 500 UNIT/GM EX OINT
TOPICAL_OINTMENT | CUTANEOUS | Status: AC
  Filled 2021-12-19: qty 28.35

## 2021-12-19 SURGICAL SUPPLY — 21 items
CANISTER SUCT 3000ML PPV (MISCELLANEOUS) IMPLANT
COVER SURGICAL LIGHT HANDLE (MISCELLANEOUS) ×2 IMPLANT
DRAPE U-SHAPE 76X120 STRL (DRAPES) ×1 IMPLANT
ELECT CAUTERY BLADE 6.4 (BLADE) ×1 IMPLANT
ELECT COATED BLADE 2.86 ST (ELECTRODE) ×1 IMPLANT
ELECT REM PT RETURN 9FT ADLT (ELECTROSURGICAL) ×2
ELECTRODE REM PT RTRN 9FT ADLT (ELECTROSURGICAL) ×1 IMPLANT
GLOVE SURG ENC TEXT LTX SZ7.5 (GLOVE) ×4 IMPLANT
GLOVE SURG UNDER LTX SZ8 (GLOVE) ×4 IMPLANT
GOWN STRL REUS W/ TWL LRG LVL3 (GOWN DISPOSABLE) ×2 IMPLANT
GOWN STRL REUS W/TWL LRG LVL3 (GOWN DISPOSABLE) ×4
KIT BASIN OR (CUSTOM PROCEDURE TRAY) ×2 IMPLANT
MANIFOLD NEPTUNE II (INSTRUMENTS) ×2 IMPLANT
NS IRRIG 1000ML POUR BTL (IV SOLUTION) ×2 IMPLANT
PACK GENERAL/GYN (CUSTOM PROCEDURE TRAY) ×2 IMPLANT
PAD ARMBOARD 7.5X6 YLW CONV (MISCELLANEOUS) ×4 IMPLANT
SUT MON AB 5-0 P3 18 (SUTURE) ×2 IMPLANT
SYR CONTROL 10ML LL (SYRINGE) ×2 IMPLANT
TOWEL GREEN STERILE (TOWEL DISPOSABLE) ×2 IMPLANT
TOWEL GREEN STERILE FF (TOWEL DISPOSABLE) ×2 IMPLANT
TUBE CONNECTING 20X1/4 (TUBING) ×2 IMPLANT

## 2021-12-19 NOTE — Discharge Instructions (Addendum)
Activity: As tolerated, but avoid strenuous activity until follow up visit.  Diet: Regular  Wound Care: We have applied Bacitracin antibiotic ointment over your repair site.  I recommend keeping the area clean and applying Vaseline once daily to help prevent it from drying out.  You can shower normally.    Special Instructions:  Call our office if any unusual problems occur such as pain, excessive bleeding, unrelieved nausea/vomiting, fever &/or chills.  Follow-up appointment: Scheduled for next week.

## 2021-12-19 NOTE — Op Note (Signed)
Operative Note   DATE OF OPERATION: 12/19/2021  SURGICAL DEPARTMENT: Plastic Surgery  PREOPERATIVE DIAGNOSES: Left alar Mohs surgery defect  POSTOPERATIVE DIAGNOSES:  same  PROCEDURE: 1.  Surgical preparation for coverage of left alar Mohs surgery defect totaling 1 x 1 cm 2.  Closure of the left alar Mohs surgery defect with adjacent tissue transfer totaling 3 x 2.5 cm including the primary and secondary defect  SURGEON: Talmadge Coventry, MD  ASSISTANT: Krista Blue, PA  ANESTHESIA:  General.   COMPLICATIONS: None.   INDICATIONS FOR PROCEDURE:  The patient, Dakota Harper is a 76 y.o. male born on Oct 10, 1946, is here for treatment of left alar Mohs surgery defect MRN: [id number]  CONSENT:  Informed consent was obtained directly from the patient. Risks, benefits and alternatives were fully discussed. Specific risks including but not limited to bleeding, infection, hematoma, seroma, scarring, pain, contracture, asymmetry, wound healing problems, and need for further surgery were all discussed. The patient did have an ample opportunity to have questions answered to satisfaction.   DESCRIPTION OF PROCEDURE:  The patient was taken to the operating room. SCDs were placed and Ancef antibiotics were given.  General anesthesia was administered.  The patient's operative site was prepped and draped in a sterile fashion. A time out was performed and all information was confirmed to be correct.  Started by marking out the left cheek transposition flap.  Local anesthetic was then injected.  I then made incisions along the nasolabial fold and a backcut superiorly.  Circumferential undermining was then performed.  A dogear was removed superiorly.  The donor site was then closed with interrupted 5-0 Monocryl sutures.  The flap was then transposed into the defect which fit nicely and secured with 5-0 Monocryl sutures.  This gave a nice on table result.  Bacitracin was applied.  The patient tolerated  the procedure well.  There were no complications. The patient was allowed to wake from anesthesia, extubated and taken to the recovery room in satisfactory condition.

## 2021-12-19 NOTE — Anesthesia Procedure Notes (Signed)
Procedure Name: Intubation Date/Time: 12/19/2021 8:15 AM Performed by: Minerva Ends, CRNA Pre-anesthesia Checklist: Patient identified, Emergency Drugs available, Suction available and Patient being monitored Patient Re-evaluated:Patient Re-evaluated prior to induction Oxygen Delivery Method: Circle system utilized Preoxygenation: Pre-oxygenation with 100% oxygen Induction Type: IV induction Ventilation: Oral airway inserted - appropriate to patient size and Mask ventilation without difficulty Laryngoscope Size: Mac and 3 Grade View: Grade II Tube type: Oral Tube size: 7.5 mm Number of attempts: 1 Airway Equipment and Method: Stylet and Oral airway Placement Confirmation: ETT inserted through vocal cords under direct vision, positive ETCO2 and breath sounds checked- equal and bilateral Secured at: 22 cm Tube secured with: Tape Dental Injury: Teeth and Oropharynx as per pre-operative assessment

## 2021-12-19 NOTE — Transfer of Care (Signed)
Immediate Anesthesia Transfer of Care Note  Patient: Dakota Harper  Procedure(s) Performed: NASAL RECONSTRUCTION (Left: Nose) ADJACENT TISSUE TRANSFER/TISSUE REARRANGEMENT (Left: Nose)  Patient Location: PACU  Anesthesia Type:General  Level of Consciousness: sedated  Airway & Oxygen Therapy: Patient Spontanous Breathing  Post-op Assessment: Report given to RN and Post -op Vital signs reviewed and stable  Post vital signs: Reviewed and stable  Last Vitals:  Vitals Value Taken Time  BP 142/59 12/19/21 0855  Temp    Pulse 87 12/19/21 0857  Resp 15 12/19/21 0857  SpO2 93 % 12/19/21 0857  Vitals shown include unvalidated device data.  Last Pain:  Vitals:   12/19/21 0640  TempSrc:   PainSc: 10-Worst pain ever      Patients Stated Pain Goal: 0 (46/04/79 9872)  Complications: No notable events documented.

## 2021-12-19 NOTE — Anesthesia Postprocedure Evaluation (Signed)
Anesthesia Post Note  Patient: Onur Mori Shakoor  Procedure(s) Performed: NASAL RECONSTRUCTION (Left: Nose) ADJACENT TISSUE TRANSFER/TISSUE REARRANGEMENT (Left: Nose)     Patient location during evaluation: PACU Anesthesia Type: General Level of consciousness: awake and alert Pain management: pain level controlled Vital Signs Assessment: post-procedure vital signs reviewed and stable Respiratory status: spontaneous breathing, nonlabored ventilation and respiratory function stable Cardiovascular status: stable and blood pressure returned to baseline Anesthetic complications: no Comments: Corneal abrasion left eye   No notable events documented.  Last Vitals:  Vitals:   12/19/21 0855 12/19/21 0925  BP:  122/60  Pulse:  81  Resp:  20  Temp: 36.8 C (!) 36.1 C  SpO2:  94%    Last Pain:  Vitals:   12/19/21 0925  TempSrc:   PainSc: [place]

## 2021-12-19 NOTE — Interval H&P Note (Signed)
Patient seen and examined. Risk and benefits discussed. Proceed with surgery.

## 2021-12-20 ENCOUNTER — Encounter: Payer: Self-pay | Admitting: *Deleted

## 2021-12-20 ENCOUNTER — Encounter (HOSPITAL_COMMUNITY): Payer: Self-pay | Admitting: Plastic Surgery

## 2021-12-20 ENCOUNTER — Ambulatory Visit (INDEPENDENT_AMBULATORY_CARE_PROVIDER_SITE_OTHER): Payer: 59 | Admitting: Podiatry

## 2021-12-20 DIAGNOSIS — M7751 Other enthesopathy of right foot: Secondary | ICD-10-CM

## 2021-12-20 NOTE — Progress Notes (Signed)
° °  Subjective:  76 y.o. male presenting today for follow-up evaluation of right ankle pain that has not improved.  Patient states that he has now had this ankle pain for several months and it is very severe.  He has tried immobilization in the cam boot, steroid injections, oral anti-inflammatories, with no significant improvement.  He would like to have an MRI performed now that conservative treatments have been unsuccessful.  He does have a history of ankle sprains several years prior.  He presents for further treatment and evaluation   Past Medical History:  Diagnosis Date   Anxiety    Arthritis    Asthma    Basal cell carcinoma 07/04/2021   L nasal ala. 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 Dermatology:  Skin is warm, dry and supple bilateral lower extremities. Negative for open lesions or macerations. Vascular:  Palpable pedal pulses bilaterally. No edema or erythema noted. Capillary refill within normal limits. Neurological:  Epicritic and protective threshold grossly intact bilaterally.  Musculoskeletal Exam:  Pain on palpation to the anterior lateral medial aspects of the patient's right ankle. Mild edema noted. Range of motion within normal limits to all pedal and ankle joints bilateral. Muscle strength 5/5 in all groups bilateral.   Radiographic Exam RT ankle 12/06/2021:  Normal osseous mineralization. Joint spaces preserved. No fracture/dislocation/boney destruction.     Assessment: 1.  Chronic ankle pain/capsulitis right ankle  Plan of Care:  1. Patient was evaluated. X-Rays reviewed.  2.  Patient states that conservative modalities including oral anti-inflammatories, steroid injections, immobilization in a cam boot have been unsuccessful in providing any sort of lasting alleviation of symptoms for the patient.  He continues to have chronic severe right ankle pain.  He is requesting an MRI. 3.  Order placed for an MRI right ankle without contrast at [facility] hospital 4. RTC after MRI to review results and discuss different treatment options   Edrick Kins, DPM [facility]  Dr. Edrick Kins, [address]                                        [place], [place] [address]                Office [phone]  Fax [phone]

## 2021-12-22 ENCOUNTER — Other Ambulatory Visit: Payer: Self-pay

## 2021-12-22 ENCOUNTER — Encounter: Payer: Self-pay | Admitting: Podiatry

## 2021-12-22 ENCOUNTER — Ambulatory Visit (INDEPENDENT_AMBULATORY_CARE_PROVIDER_SITE_OTHER): Payer: 59 | Admitting: Podiatry

## 2021-12-22 ENCOUNTER — Encounter: Payer: 59 | Admitting: Plastic Surgery

## 2021-12-22 DIAGNOSIS — N1832 Chronic kidney disease, stage 3b: Secondary | ICD-10-CM

## 2021-12-22 DIAGNOSIS — L600 Ingrowing nail: Secondary | ICD-10-CM

## 2021-12-22 NOTE — Progress Notes (Signed)
This patient presents to the office stating he has pain  due to the ingrown teonails both big toes.      He presents to the office for evaluation and treatment.  He denies any drainage or infection. He presents for preventative foot care services.  Vascular  Dorsalis pedis and posterior tibial pulses are weakly  palpable  B/L.  Capillary return  WNL.  Temperature gradient is  WNL.  Skin turgor  WNL  Sensorium  Senn Weinstein monofilament wire  WNL. Normal tactile sensation.  Nail Exam  Patient has normal nails with no evidence of bacterial or fungal infection. Marked incurvation medial and lateral borders both hallux toes.  No signs of redness or infection noted.  Orthopedic  Exam  Muscle tone and muscle strength  WNL.  No limitations of motion feet  B/L.  No crepitus or joint effusion noted.  Foot type is unremarkable and digits show no abnormalities.  HAV  B/L.   Skin  No open lesions.  Normal skin texture and turgor  .  Symptomatic pinch callus right hallux.   Ingrown nails  hallux  B/l.   Debride nails hallux with nail nipper and followed by dremel tool usage. RTC 3 months    Gardiner Barefoot DPM

## 2021-12-26 ENCOUNTER — Telehealth: Payer: Self-pay

## 2021-12-26 NOTE — Telephone Encounter (Signed)
-----   Message from Edrick Kins, DPM sent at 12/20/2021  5:12 PM EST ----- Regarding: MRI RT ankle at [facility] I am ordering an MRI of the right ankle for this patient at [facility].  Thanks, Dr. Amalia Hailey

## 2021-12-26 NOTE — Telephone Encounter (Signed)
Spoke with Solmon Ice [facility]), no precert is required for MRI code [id number]   Ref # [id number] I spoke with Lelon Frohlich at the [facility] and gave her scheduling number, she will call to set up appt convenient for her and patient.  [facility] [employer] Lelon Frohlich) cell # [phone]

## 2021-12-28 ENCOUNTER — Other Ambulatory Visit: Payer: Self-pay

## 2021-12-28 ENCOUNTER — Ambulatory Visit (INDEPENDENT_AMBULATORY_CARE_PROVIDER_SITE_OTHER): Payer: 59 | Admitting: Plastic Surgery

## 2021-12-28 DIAGNOSIS — C44311 Basal cell carcinoma of skin of nose: Secondary | ICD-10-CM

## 2021-12-28 NOTE — Progress Notes (Signed)
Patient presents postop after transposition flap for left alar reconstruction.  He is overall very happy.  On exam everything looks to be healing well with great contour.  We discussed scar management and where to go from here.  I offered to see him again in 3 to 4 months to evaluate whether he is happy or not but he declined.  He will plan to call us if he has any questions or problems.  We will plan to see him again on an as-needed basis.

## 2022-01-03 ENCOUNTER — Ambulatory Visit: Payer: 59 | Admitting: Podiatry

## 2022-01-05 ENCOUNTER — Ambulatory Visit
Admission: RE | Admit: 2022-01-05 | Discharge: 2022-01-05 | Disposition: A | Discharge status: Home / Self Care | Payer: 59 | Source: Ambulatory Visit | Attending: Podiatry | Admitting: Podiatry

## 2022-01-05 ENCOUNTER — Other Ambulatory Visit: Payer: Self-pay

## 2022-01-05 DIAGNOSIS — M7751 Other enthesopathy of right foot: Secondary | ICD-10-CM | POA: Diagnosis present

## 2022-01-10 ENCOUNTER — Encounter: Payer: Self-pay | Admitting: Podiatry

## 2022-01-10 ENCOUNTER — Ambulatory Visit (INDEPENDENT_AMBULATORY_CARE_PROVIDER_SITE_OTHER): Payer: 59 | Admitting: Podiatry

## 2022-01-10 ENCOUNTER — Telehealth: Payer: Self-pay

## 2022-01-10 ENCOUNTER — Other Ambulatory Visit: Payer: Self-pay

## 2022-01-10 DIAGNOSIS — M25571 Pain in right ankle and joints of right foot: Secondary | ICD-10-CM

## 2022-01-10 MED ORDER — BETAMETHASONE SOD PHOS & ACET 6 (3-3) MG/ML IJ SUSP: 3.0000 mg | Freq: Once | INTRAMUSCULAR | Status: DC

## 2022-01-10 NOTE — Telephone Encounter (Signed)
Patient completed [facility] for [facility] on left nasal ala on 12/13/21 with Dr. Hassell Done at [facility] [facility].  ?

## 2022-01-10 NOTE — Progress Notes (Signed)
? ?  Subjective:  ?76 y.o. male presenting today for follow-up evaluation of right ankle pain that has not improved.  He continues to have pain and tenderness associated to the right foot and ankle.  Last visit MRI was ordered and he presents today to review the MRI results and discuss further treatment options ? ?Past Medical History:  ?Diagnosis Date  ? Anxiety   ? Arthritis   ? Asthma   ? Basal cell carcinoma 07/04/2021  ? L nasal ala. EDC scheduled 08/02/21  ? BPH (benign prostatic hyperplasia)   ? Colon adenomas   ? COPD (chronic obstructive pulmonary disease) ([place])   ? COVID   ? Depression   ? in the 1960's  ? 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 R scapula, mod atypia  ? Ear pain   ? GERD (gastroesophageal reflux disease)   ? Gout   ? Headache   ? History of kidney stones   ? Hypercholesteremia   ? Hypertension   ? Hypothyroidism   ? Neuropathy   ? Pneumonia   ? Sleep disorder   ? ? ? ?Objective / Physical Exam:  ?General:  ?The patient is alert and oriented x3 in no acute distress. ?Dermatology:  ?Skin is warm, dry and supple bilateral lower extremities. Negative for open lesions or macerations. ?Vascular:  ?Palpable pedal pulses bilaterally. No edema or erythema noted. Capillary refill within normal limits. ?Neurological:  ?Epicritic and protective threshold grossly intact bilaterally.  ?Musculoskeletal Exam:  ?Pain on palpation to the anterior lateral medial aspects of the patient's right ankle as well as the sinus tarsi laterally. Mild edema noted. Range of motion within normal limits to all pedal and ankle joints bilateral. Muscle strength 5/5 in all groups bilateral.  ? ?MR ANKLE RT WO CONTRAST 01/05/2022: ?IMPRESSION: ?1.  Intact peroneal tendons and lateral ankle ligaments. ?2.  Mild tibiotalar joint effusion. ?3. 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 ?4. Mild-to-moderate swelling and edema of the subcutaneous fat about ?the distal medial calf and the medial and lateral  ankle. ?5. Mild dorsal talonavicular and navicular-cuneiform degenerative ?osteophytes. ? ?Assessment: ?1.  Chronic ankle pain/capsulitis right ankle ?2.  Possible sinus tarsitis right ? ?Plan of Care:  ?1. Patient was evaluated.  MRI reviewed.  ?2.  Injection of 0.5 cc Celestone Soluspan injected in sinus tarsi right ?3.  Continue cam boot as needed.  Patient may transition of the cam boot to good supportive shoes and sneakers ?4.  Order placed for physical therapy.  Prescription was provided to take to '[facility]' where the patient lives.  Apparently they have in-house physical therapy ?5.  Return to clinic as needed ? ?Edrick Kins, DPM ?[facility] ? ?Dr. Edrick Kins, DPM  ?  ?[facility]                                        ?[place], [place] [address]                ?Office [phone]  ?Fax [phone] ? ?

## 2022-01-23 ENCOUNTER — Telehealth: Payer: Self-pay | Admitting: Podiatry

## 2022-01-23 NOTE — Telephone Encounter (Signed)
Patient came in today asking if he is a candidate for Hyuronic Acid Injections? Patient states he is in pain and started PT last week. Pts number [phone]. ?

## 2022-01-24 ENCOUNTER — Ambulatory Visit (INDEPENDENT_AMBULATORY_CARE_PROVIDER_SITE_OTHER): Payer: 59 | Admitting: Podiatry

## 2022-01-24 ENCOUNTER — Other Ambulatory Visit: Payer: Self-pay

## 2022-01-24 DIAGNOSIS — M7751 Other enthesopathy of right foot: Secondary | ICD-10-CM

## 2022-01-24 NOTE — Progress Notes (Signed)
? ?  Subjective:  ?76 y.o. male presenting today for follow-up evaluation of right ankle pain that has not improved.  Patient states that he just started physical therapy this week.  He presents for further treatment and evaluation ? ? ?Past Medical History:  ?Diagnosis Date  ? Anxiety   ? Arthritis   ? Asthma   ? Basal cell carcinoma 07/04/2021  ? L nasal ala. MOHS completed 12/13/2021  ? BPH (benign prostatic hyperplasia)   ? Colon adenomas   ? COPD (chronic obstructive pulmonary disease) ([place])   ? COVID   ? Depression   ? in the 1960's  ? 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 R scapula, mod atypia  ? Ear pain   ? GERD (gastroesophageal reflux disease)   ? Gout   ? Headache   ? History of kidney stones   ? Hypercholesteremia   ? Hypertension   ? Hypothyroidism   ? Neuropathy   ? Pneumonia   ? Sleep disorder   ? ? ? ?Objective / Physical Exam:  ?General:  ?The patient is alert and oriented x3 in no acute distress. ?Dermatology:  ?Skin is warm, dry and supple bilateral lower extremities. Negative for open lesions or macerations. ?Vascular:  ?Palpable pedal pulses bilaterally. No edema or erythema noted. Capillary refill within normal limits. ?Neurological:  ?Epicritic and protective threshold grossly intact bilaterally.  ?Musculoskeletal Exam:  ?There continues to be some moderate pain on palpation to the anterior lateral medial aspects of the patient's right ankle. Mild edema noted. Range of motion within normal limits to all pedal and ankle joints bilateral. Muscle strength 5/5 in all groups bilateral.  ? ?Radiographic Exam RT ankle 12/06/2021:  ?Normal osseous mineralization. Joint spaces preserved. No fracture/dislocation/boney destruction.   ? ?Assessment: ?1.  Chronic ankle pain/capsulitis right ankle ? ?Plan of Care:  ?1. Patient was evaluated. X-Rays reviewed.  ?2.  Injection of 0.5 cc Celestone Soluspan injection the lateral aspect of the right ankle ?3.  Continue physical therapy at '[facility]' where the patient  resides ?4.  Continue daily compression either with an Ace wrap or compression sleeve which were both dispensed today ?5.  Return to clinic as needed ? ? ?Edrick Kins, DPM ?[facility] ? ?Dr. Edrick Kins, DPM  ?  ?[address]                                   ?[place], [place] [address]                ?Office [phone]  ?Fax [phone] ? ?

## 2022-01-24 NOTE — Telephone Encounter (Signed)
Pt came to [facility] office per Evelin scheduled him an appt with Dr Amalia Hailey 01/24/2022. ?

## 2022-01-24 NOTE — Telephone Encounter (Signed)
Pt called again asking about getting the injection for the arthritis. He stated he was just going to walk over to the office in [place]. ?

## 2022-02-08 ENCOUNTER — Ambulatory Visit (INDEPENDENT_AMBULATORY_CARE_PROVIDER_SITE_OTHER): Payer: 59 | Admitting: Dermatology

## 2022-02-08 DIAGNOSIS — L578 Other skin changes due to chronic exposure to nonionizing radiation: Secondary | ICD-10-CM | POA: Diagnosis not present

## 2022-02-08 DIAGNOSIS — L821 Other seborrheic keratosis: Secondary | ICD-10-CM

## 2022-02-08 DIAGNOSIS — Z85828 Personal history of other malignant neoplasm of skin: Secondary | ICD-10-CM | POA: Diagnosis not present

## 2022-02-08 DIAGNOSIS — L720 Epidermal cyst: Secondary | ICD-10-CM

## 2022-02-08 NOTE — Progress Notes (Signed)
? ?  Follow-Up Visit ?  ?Subjective  ?Dakota Harper is a 76 y.o. male who presents for the following: Skin Problem (Patient with a new spot behind left ear. He thought it was an acne bump and has been using salicylic acid on it but it has not helped. Patient does not want it to get larger. ).  It is bothersome and he would like it removed. ? ? ?The following portions of the chart were reviewed this encounter and updated as appropriate:  ?  ?  ? ?Review of Systems:  No other skin or systemic complaints except as noted in HPI or Assessment and Plan. ? ?Objective  ?Well appearing patient in no apparent distress; mood and affect are within normal limits. ? ?A focused examination was performed including left neck. Relevant physical exam findings are noted in the Assessment and Plan. ? ?left postauricular neck ?Smooth white papule(s).  ? ? ? ?Assessment & Plan  ?Milia ?left postauricular neck ? ?Symptomatic ? ?Extraction today ?Procedure risks and benefits were discussed with the patient and verbal consent was obtained. Following prep of the skin on the mid forehead with an alcohol swab, extraction of milia was performed with a comedone extractor following superficial incision made over their surfaces with a #15 surgical blade. Capillary hemostasis was achieved with 20% aluminum chloride solution. Vaseline ointment was applied to each site. The patient tolerated the procedure well. ? ? ?Seborrheic Keratoses ?- Stuck-on, waxy, tan-brown papules and/or plaques at popliteal ?- Benign-appearing ?- Discussed benign etiology and prognosis. ?- Observe ?- Call for any changes ? ?History of Basal Cell Carcinoma of the Skin L nasal ala, s/p Mohs ?- No evidence of recurrence today ?- Recommend regular full body skin exams ?- Recommend daily broad spectrum sunscreen SPF 30+ to sun-exposed areas, reapply every 2 hours as needed.  ?- Call if any new or changing lesions are noted between office visits  ? ?Actinic Damage ?- chronic,  secondary to cumulative UV radiation exposure/sun exposure over time ?- diffuse scaly erythematous macules with underlying dyspigmentation ?- Recommend daily broad spectrum sunscreen SPF 30+ to sun-exposed areas, reapply every 2 hours as needed.  ?- Recommend staying in the shade or wearing long sleeves, sun glasses (UVA+UVB protection) and wide brim hats (4-inch brim around the entire circumference of the hat). ?- Call for new or changing lesions.  ? ? ?Return for as scheduled. ? ?Graciella Belton, RMA, am acting as scribe for Brendolyn Patty, MD . ? ?Documentation: I have reviewed the above documentation for accuracy and completeness, and I agree with the above. ? ?Brendolyn Patty MD  ? ?

## 2022-02-08 NOTE — Patient Instructions (Signed)
If You Need Anything After Your Visit ? ?If you have any questions or concerns for your doctor, please call our main line at [phone] and press option 4 to reach your doctor's medical assistant. If no one answers, please leave a voicemail as directed and we will return your call as soon as possible. Messages left after 4 pm will be answered the following business day.  ? ?You may also send us a message via MyChart. We typically respond to MyChart messages within 1-2 business days. ? ?For prescription refills, please ask your pharmacy to contact our office. Our fax number is [phone]. ? ?If you have an urgent issue when the clinic is closed that cannot wait until the next business day, you can page your doctor at the number below.   ? ?Please note that while we do our best to be available for urgent issues outside of office hours, we are not available 24/7.  ? ?If you have an urgent issue and are unable to reach us, you may choose to seek medical care at your doctor's office, retail clinic, urgent care center, or emergency room. ? ?If you have a medical emergency, please immediately call 911 or go to the emergency department. ? ?Pager Numbers ? ?- Dr. Kowalski: [phone] ? ?- Dr. Moye: [phone] ? ?- Dr. Stewart: [phone] ? ?In the event of inclement weather, please call our main line at [phone] for an update on the status of any delays or closures. ? ?Dermatology Medication Tips: ?Please keep the boxes that topical medications come in in order to help keep track of the instructions about where and how to use these. Pharmacies typically print the medication instructions only on the boxes and not directly on the medication tubes.  ? ?If your medication is too expensive, please contact our office at [phone] option 4 or send us a message through MyChart.  ? ?We are unable to tell what your co-pay for medications will be in advance as this is different depending on your insurance coverage.  However, we may be able to find a substitute medication at lower cost or fill out paperwork to get insurance to cover a needed medication.  ? ?If a prior authorization is required to get your medication covered by your insurance company, please allow us 1-2 business days to complete this process. ? ?Drug prices often vary depending on where the prescription is filled and some pharmacies may offer cheaper prices. ? ?The website www.goodrx.com contains coupons for medications through different pharmacies. The prices here do not account for what the cost may be with help from insurance (it may be cheaper with your insurance), but the website can give you the price if you did not use any insurance.  ?- You can print the associated coupon and take it with your prescription to the pharmacy.  ?- You may also stop by our office during regular business hours and pick up a GoodRx coupon card.  ?- If you need your prescription sent electronically to a different pharmacy, notify our office through [facility] MyChart or by phone at [phone] option 4. ? ? ? ? ?Si Usted Necesita Algo Despu?s de Su Visita ? ?Tambi?n puede enviarnos un mensaje a trav?s de MyChart. Por lo general respondemos a los mensajes de MyChart en el transcurso de 1 a 2 d?as h?biles. ? ?Para renovar recetas, por favor pida a su farmacia que se ponga en contacto con nuestra oficina. Nuestro n?mero de fax es el [phone]. ? ?Si tiene   un asunto urgente cuando la cl?nica est? cerrada y que no puede esperar hasta el siguiente d?a h?bil, puede llamar/localizar a su doctor(a) al n?mero que aparece a continuaci?n.  ? ?Por favor, tenga en cuenta que aunque hacemos todo lo posible para estar disponibles para asuntos urgentes fuera del horario de oficina, no estamos disponibles las 24 horas del d?a, los 7 d?as de la semana.  ? ?Si tiene un problema urgente y no puede comunicarse con nosotros, puede optar por buscar atenci?n m?dica  en el consultorio de su  doctor(a), en una cl?nica privada, en un centro de atenci?n urgente o en una sala de emergencias. ? ?Si tiene Engineer, maintenance (IT) m?dica, por favor llame inmediatamente al 911 o vaya a la sala de emergencias. ? ?N?meros de b?per ? ?- Dr. Nehemiah Massed: [phone] ? ?- Dra. Moye: [phone] ? ?- Dra. Nicole Kindred: [phone] ? ?En caso de inclemencias del tiempo, por favor llame a nuestra l?nea principal al [phone] para una actualizaci?n sobre el estado de cualquier retraso o cierre. ? ?Consejos para la medicaci?n en dermatolog?a: ?Por favor, guarde las cajas en las que vienen los medicamentos de uso t?pico para ayudarle a seguir las instrucciones sobre d?nde y c?mo usarlos. Las farmacias generalmente imprimen las instrucciones del medicamento s?lo en las cajas y no directamente en los tubos del [place].  ? ?Si su medicamento es muy caro, por favor, p?ngase en contacto con Zigmund Daniel llamando al [phone] y presione la opci?n 4 o env?enos un mensaje a trav?s de MyChart.  ? ?No podemos decirle cu?l ser? su copago por los medicamentos por adelantado ya que esto es diferente dependiendo de la cobertura de su seguro. Sin embargo, es posible que podamos encontrar un medicamento sustituto a Electrical engineer un formulario para que el seguro cubra el medicamento que se considera necesario.  ? ?Si se requiere Ardelia Mems autorizaci?n previa para que su compa??a de seguros [place] su medicamento, por favor perm?tanos de 1 a 2 d?as h?biles para completar este proceso. ? ?Los precios de los medicamentos var?an con frecuencia dependiendo del Environmental consultant de d?nde se surte la receta y alguna farmacias pueden ofrecer precios m?s baratos. ? ?El sitio web www.goodrx.com tiene cupones para medicamentos de Airline pilot. Los precios aqu? no tienen en cuenta lo que podr?a costar con la ayuda del seguro (puede ser m?s barato con su seguro), pero el sitio web puede darle el precio si no utiliz? ning?n seguro.  ?- Puede imprimir el cup?n  correspondiente y llevarlo con su receta a la farmacia.  ?- Tambi?n puede pasar por nuestra oficina durante el horario de atenci?n regular y recoger una tarjeta de cupones de GoodRx.  ?- Si necesita que su receta se env?e electr?nicamente a Chiropodist, informe a nuestra oficina a trav?s de MyChart de [place] o por tel?fono llamando al [phone] y presione la opci?n 4. ?a ?

## 2022-02-28 ENCOUNTER — Encounter: Payer: Self-pay | Admitting: Podiatry

## 2022-02-28 ENCOUNTER — Ambulatory Visit (INDEPENDENT_AMBULATORY_CARE_PROVIDER_SITE_OTHER): Payer: 59 | Admitting: Podiatry

## 2022-02-28 DIAGNOSIS — M7751 Other enthesopathy of right foot: Secondary | ICD-10-CM

## 2022-02-28 NOTE — Progress Notes (Signed)
? ?Subjective:  ?76 y.o. male presenting today for follow-up evaluation of right ankle pain that has not improved.  Patient states that he is doing physical therapy where he resides but there is no improvement.  He continues to have constant pain and tenderness to the right ankle.  He would like to discuss possible surgery. ? ? ?Past Medical History:  ?Diagnosis Date  ? Anxiety   ? Arthritis   ? Asthma   ? Basal cell carcinoma 07/04/2021  ? L nasal ala. MOHS completed 12/13/2021  ? BPH (benign prostatic hyperplasia)   ? Colon adenomas   ? COPD (chronic obstructive pulmonary disease) ([place])   ? COVID   ? Depression   ? in the 1960's  ? 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 R scapula, mod atypia  ? Ear pain   ? GERD (gastroesophageal reflux disease)   ? Gout   ? Headache   ? History of kidney stones   ? Hypercholesteremia   ? Hypertension   ? Hypothyroidism   ? Neuropathy   ? Pneumonia   ? Sleep disorder   ? ? ? ?Objective / Physical Exam:  ?General:  ?The patient is alert and oriented x3 in no acute distress. ?Dermatology:  ?Skin is warm, dry and supple bilateral lower extremities. Negative for open lesions or macerations. ?Vascular:  ?Palpable pedal pulses bilaterally. No edema or erythema noted. Capillary refill within normal limits. ?Neurological:  ?Epicritic and protective threshold grossly intact bilaterally.  ?Musculoskeletal Exam:  ?There continues to be significant pain on palpation specifically to the lateral aspect of the patient's right ankle.  Edema appears improved today.  There is no pain with inversion and eversion of the subtalar joint.  Pressure over the sinus tarsi does not elicit any pain.  I do not suspect the patient is suffering from sinus tarsitis although MRI does show some slight edema in the area.  Range of motion within normal limits to all pedal and ankle joints bilateral. Muscle strength 5/5 in all groups bilateral.  ? ?Radiographic Exam RT ankle 12/06/2021:  ?Normal osseous mineralization.  Joint spaces preserved. No fracture/dislocation/boney destruction.   ? ?MR RT ankle w/o contrast 01/05/2022 ?IMPRESSION: ?1.  Intact peroneal tendons and lateral ankle ligaments. ?2.  Mild tibiotalar joint effusion. ?3. 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 ?4. Mild-to-moderate swelling and edema of the subcutaneous fat about ?the distal medial calf and the medial and lateral ankle. ?5. Mild dorsal talonavicular and navicular-cuneiform degenerative ?osteophytes. ?  ? ?Assessment: ?1.  Chronic ankle pain/capsulitis right ankle ? ?Plan of Care:  ?1. Patient was evaluated. X-Rays and MRI reviewed again today ?2.  Patient states that the physical therapy he is receiving where the patient resides is making the ankle worse.  He would like to discuss possible surgery since he has dealt with this ankle pain for several months despite conservative treatment including immobilization in a cam boot, physical therapy, steroid oral anti-inflammatory injections and ankle bracing ?3. Today we discussed the conservative versus surgical management of the presenting pathology. The patient opts for surgical management. All possible complications and details of the procedure were explained. All patient questions were answered. No guarantees were expressed or implied. ?4. Authorization for surgery was initiated today. Surgery will consist of ankle arthroscopy right ?5.  Patient will require medical clearance prior to surgery from his PCP  ?6.  Return to clinic 1 week postop ? ? ? ?Edrick Kins, DPM ?[facility] ? ?Dr. Edrick Kins, DPM  ?  ?[address]                                   ?[place], [place] [address]                ?Office [phone]  ?Fax [phone] ? ?

## 2022-03-01 ENCOUNTER — Telehealth: Payer: Self-pay

## 2022-03-01 NOTE — Telephone Encounter (Signed)
Received surgery paperwork from the [facility] office. Left a message for [place] to call and schedule surgery with Dr. Amalia Hailey ?

## 2022-03-10 ENCOUNTER — Encounter: Payer: Self-pay | Admitting: Podiatry

## 2022-03-20 ENCOUNTER — Telehealth: Payer: Self-pay

## 2022-03-20 NOTE — Telephone Encounter (Signed)
DOS 04/06/2022 ? ?ANKLE ARTHROSCOPY RT - 29897 ? ?[employer] EFFECTIVE DATE - 11/06/2021 ? ?PLAN DEDUCTIBLE - $233.00 W/ $0.00 REMAINING ?OUT OF POCKET - $8300.00 K/$0938.18 REMAINING ?COPAY $0.00 ?COINSURANCE - 20% ? ? ? ?NOTIFICATION/PRIOR AUTHORIZATION NUMBER CASE STATUS CASE STATUS REASON PRIMARY CARE PHYSICIAN ?[id number] [employer] ?ADVANCE NOTIFY DATE/TIME ADMISSION NOTIFY DATE/TIME ?03/20/2022 12:34 PM CDT - ?COVERAGE STATUS ?OVERALL COVERAGE STATUS ?Covered/Approved ?1-1 CODE DESCRIPTION COVERAGE ?STATUS DECISION DATE ?[facility] ?Coverage determination is reflected for the facility admission and is not a guarantee of payment for ongoing services. ?Covered/Approved 03/20/2022 1 [id number] Arthroscopy, ankle (tibiotalar and fibul more Covered/Approved 03/20/2022 ?

## 2022-03-23 ENCOUNTER — Encounter: Payer: Self-pay | Admitting: Podiatry

## 2022-03-23 ENCOUNTER — Ambulatory Visit (INDEPENDENT_AMBULATORY_CARE_PROVIDER_SITE_OTHER): Payer: 59 | Admitting: Podiatry

## 2022-03-23 DIAGNOSIS — N183 Chronic kidney disease, stage 3 unspecified: Secondary | ICD-10-CM

## 2022-03-23 DIAGNOSIS — N1832 Chronic kidney disease, stage 3b: Secondary | ICD-10-CM

## 2022-03-23 DIAGNOSIS — L600 Ingrowing nail: Secondary | ICD-10-CM | POA: Diagnosis not present

## 2022-03-23 NOTE — Progress Notes (Signed)
This patient presents to the office stating he has pain  due to the ingrown teonails both big toes.      He presents to the office for evaluation and treatment.  He denies any drainage or infection. He presents for preventative foot care services.  Vascular  Dorsalis pedis and posterior tibial pulses are weakly  palpable  B/L.  Capillary return  WNL.  Temperature gradient is  WNL.  Skin turgor  WNL  Sensorium  Senn Weinstein monofilament wire  WNL. Normal tactile sensation.  Nail Exam  Patient has normal nails with no evidence of bacterial or fungal infection. Marked incurvation medial and lateral borders both hallux toes.  No signs of redness or infection noted.  Orthopedic  Exam  Muscle tone and muscle strength  WNL.  No limitations of motion feet  B/L.  No crepitus or joint effusion noted.  Foot type is unremarkable and digits show no abnormalities.  HAV  B/L.   Skin  No open lesions.  Normal skin texture and turgor  .  Symptomatic pinch callus right hallux.   Ingrown nails  hallux  B/l.   Debride nails hallux with nail nipper and followed by dremel tool usage. RTC 3 months    Gardiner Barefoot DPM

## 2022-03-30 ENCOUNTER — Telehealth: Payer: Self-pay | Admitting: Podiatry

## 2022-03-30 NOTE — Telephone Encounter (Signed)
Patient came into the office at [facility] today stating he is having surgery next Thursday and he would like his RX for pain medication sent over because he is in a nursing facility. He is worried he won't get them in time for surgery.

## 2022-04-06 ENCOUNTER — Other Ambulatory Visit: Payer: Self-pay | Admitting: Podiatry

## 2022-04-06 DIAGNOSIS — M7751 Other enthesopathy of right foot: Secondary | ICD-10-CM | POA: Diagnosis not present

## 2022-04-06 MED ORDER — MELOXICAM 15 MG PO TABS: 15.0000 mg | ORAL_TABLET | Freq: Every day | ORAL | 1 refills | Status: DC

## 2022-04-06 MED ORDER — OXYCODONE-ACETAMINOPHEN 5-325 MG PO TABS: 1.0000 | ORAL_TABLET | ORAL | 0 refills | Status: DC | PRN

## 2022-04-06 NOTE — Progress Notes (Signed)
PRN postop 

## 2022-04-14 ENCOUNTER — Ambulatory Visit (INDEPENDENT_AMBULATORY_CARE_PROVIDER_SITE_OTHER): Payer: 59 | Admitting: Podiatry

## 2022-04-14 DIAGNOSIS — Z9889 Other specified postprocedural states: Secondary | ICD-10-CM

## 2022-04-14 NOTE — Progress Notes (Signed)
   Subjective:  Patient presents today status post right ankle arthroscopy with limited debridement. DOS: 04/06/2022.  Patient states that he is feeling well.  He took the dressings off after 24 hours.  Today he is not wearing the cam boot.  He has been wearing the compression sleeve with an ankle brace.  He has Band-Aids over the incision sites.  Past Medical History:  Diagnosis Date   Anxiety    Arthritis    Asthma    Basal cell carcinoma 07/04/2021   L nasal ala. 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  Skin incisions appear to be well coapted with sutures intact. No sign of infectious process noted. No dehiscence. No active bleeding noted.  Minimal edema noted to the surgical extremity.   Assessment: 1. s/p ankle arthroscopy with limited debridement right. DOS: 04/06/2022   Plan of Care:  1. Patient was evaluated.  2.  Sutures removed 3.  Continue ankle brace with good supportive shoes and sneakers 4.  Return to clinic 3 weeks  Edrick Kins, DPM [facility]  Dr. Edrick Kins, DPM    [address], [place] [address]                Office [phone]  Fax [phone]

## 2022-04-21 ENCOUNTER — Encounter: Payer: 59 | Admitting: Podiatry

## 2022-05-04 ENCOUNTER — Ambulatory Visit (INDEPENDENT_AMBULATORY_CARE_PROVIDER_SITE_OTHER): Payer: 59 | Admitting: Podiatry

## 2022-05-04 DIAGNOSIS — Z9889 Other specified postprocedural states: Secondary | ICD-10-CM

## 2022-05-11 ENCOUNTER — Ambulatory Visit (INDEPENDENT_AMBULATORY_CARE_PROVIDER_SITE_OTHER): Payer: 59 | Admitting: Dermatology

## 2022-05-11 DIAGNOSIS — Z85828 Personal history of other malignant neoplasm of skin: Secondary | ICD-10-CM

## 2022-05-11 DIAGNOSIS — D229 Melanocytic nevi, unspecified: Secondary | ICD-10-CM

## 2022-05-11 DIAGNOSIS — L219 Seborrheic dermatitis, unspecified: Secondary | ICD-10-CM | POA: Diagnosis not present

## 2022-05-11 DIAGNOSIS — L309 Dermatitis, unspecified: Secondary | ICD-10-CM

## 2022-05-11 DIAGNOSIS — L578 Other skin changes due to chronic exposure to nonionizing radiation: Secondary | ICD-10-CM

## 2022-05-11 MED ORDER — KETOCONAZOLE 2 % EX CREA: 1.0000 | TOPICAL_CREAM | CUTANEOUS | 3 refills | Status: AC

## 2022-05-11 NOTE — Patient Instructions (Addendum)
Continue ketoconazole shampoo as directed  Start ketoconazole cream - apply 1 - 2 times daily to scalp   Continue Dermasmooth oil as needed for itch at scalp.     Due to recent changes in healthcare laws, you may see results of your pathology and/or laboratory studies on MyChart before the doctors have had a chance to review them. We understand that in some cases there may be results that are confusing or concerning to you. Please understand that not all results are received at the same time and often the doctors may need to interpret multiple results in order to provide you with the best plan of care or course of treatment. Therefore, we ask that you please give [place] 2 business days to thoroughly review all your results before contacting the office for clarification. Should we see a critical lab result, you will be contacted sooner.   If You Need Anything After Your Visit  If you have any questions or concerns for your doctor, please call our main line at [phone] and press option 4 to reach your doctor's medical assistant. If no one answers, please leave a voicemail as directed and we will return your call as soon as possible. Messages left after 4 pm will be answered the following business day.   You may also send [place] a message via [place]. We typically respond to MyChart messages within 1-2 business days.  For prescription refills, please ask your pharmacy to contact our office. Our fax number is [phone].  If you have an urgent issue when the clinic is closed that cannot wait until the next business day, you can page your doctor at the number below.    Please note that while we do our best to be available for urgent issues outside of office hours, we are not available 24/7.   If you have an urgent issue and are unable to reach [place], you may choose to seek medical care at your doctor's office, retail clinic, urgent care center, or emergency room.  If you have a medical emergency,  please immediately call 911 or go to the emergency department.  Pager Numbers  - Dr. Nehemiah Massed: [phone]  - Dr. Laurence Ferrari: [phone]  - Dr. Nicole Kindred: [phone]  In the event of inclement weather, please call our main line at [phone] for an update on the status of any delays or closures.  Dermatology Medication Tips: Please keep the boxes that topical medications come in in order to help keep track of the instructions about where and how to use these. Pharmacies typically print the medication instructions only on the boxes and not directly on the medication tubes.   If your medication is too expensive, please contact our office at [phone] option 4 or send [place] a message through [place].   We are unable to tell what your co-pay for medications will be in advance as this is different depending on your insurance coverage. However, we may be able to find a substitute medication at lower cost or fill out paperwork to get insurance to cover a needed medication.   If a prior authorization is required to get your medication covered by your insurance company, please allow [place] 1-2 business days to complete this process.  Drug prices often vary depending on where the prescription is filled and some pharmacies may offer cheaper prices.  The website www.goodrx.com contains coupons for medications through different pharmacies. The prices here do not account for what the cost may be with help from insurance (it  may be cheaper with your insurance), but the website can give you the price if you did not use any insurance.  - You can print the associated coupon and take it with your prescription to the pharmacy.  - You may also stop by our office during regular business hours and pick up a GoodRx coupon card.  - If you need your prescription sent electronically to a different pharmacy, notify our office through [facility] or by phone at [phone] option 4.     Si Usted Necesita Algo  Despus de Su Visita  Tambin puede enviarnos un mensaje a travs de Pharmacist, community. Por lo general respondemos a los mensajes de MyChart en el transcurso de 1 a 2 das hbiles.  Para renovar recetas, por favor pida a su farmacia que se ponga en contacto con nuestra oficina. Harland Dingwall de fax es [place] [phone].  Si tiene un asunto urgente cuando la clnica est cerrada y que no puede esperar hasta el siguiente da hbil, puede llamar/localizar a su doctor(a) al nmero que aparece a continuacin.   Por favor, tenga en cuenta que aunque hacemos todo lo posible para estar disponibles para asuntos urgentes fuera del horario de [place], no estamos disponibles las 24 horas del da, los 7 das de la [place].   Si tiene un problema urgente y no puede comunicarse con nosotros, puede optar por buscar atencin mdica  en el consultorio de su doctor(a), en una clnica privada, en un centro de atencin urgente o en una sala de emergencias.  Si tiene Engineering geologist, por favor llame inmediatamente al 911 o vaya a la sala de emergencias.  Nmeros de bper  - Dr. Nehemiah Massed: [phone]  - Dra. Moye: [phone]  - Dra. Nicole Kindred: [phone]  En caso de inclemencias del [place], por favor llame a Johnsie Kindred principal al [phone] para una actualizacin sobre el [place] de cualquier retraso o cierre.  Consejos para la medicacin en dermatologa: Por favor, guarde las cajas en las que vienen los medicamentos de uso tpico para ayudarle a seguir las instrucciones sobre dnde y cmo usarlos. Las farmacias generalmente imprimen las instrucciones del medicamento slo en las cajas y no directamente en los tubos del [place].   Si su medicamento es muy caro, por favor, pngase en contacto con Zigmund Daniel llamando al [phone] y presione la opcin 4 o envenos un mensaje a travs de Pharmacist, community.   No podemos decirle cul ser su copago por los medicamentos por adelantado ya que esto es diferente  dependiendo de la cobertura de su seguro. Sin embargo, es posible que podamos encontrar un medicamento sustituto a Electrical engineer un formulario para que el seguro cubra el medicamento que se considera necesario.   Si se requiere una autorizacin previa para que su compaa de seguros [place] su medicamento, por favor permtanos de 1 a 2 das hbiles para completar este proceso.  Los precios de los medicamentos varan con frecuencia dependiendo del Environmental consultant de dnde se surte la receta y alguna farmacias pueden ofrecer precios ms baratos.  El sitio web www.goodrx.com tiene cupones para medicamentos de Airline pilot. Los precios aqu no tienen en cuenta lo que podra costar con la ayuda del seguro (puede ser ms barato con su seguro), pero el sitio web puede darle el precio si no utiliz Research scientist (physical sciences).  - Puede imprimir el cupn correspondiente y llevarlo con su receta a la farmacia.  - Tambin puede pasar por nuestra oficina durante el horario de atencin regular  y recoger una tarjeta de cupones de GoodRx.  - Si necesita que su receta se enve electrnicamente a una farmacia diferente, informe a nuestra oficina a travs de MyChart de [place] o por telfono llamando al [phone] y presione la opcin 4.

## 2022-05-11 NOTE — Progress Notes (Signed)
Follow-Up Visit   Subjective  Dakota Harper is a 76 y.o. male who presents for the following: Eczema (Patient has some itchy areas at lower legs, itchy scalp). Patches on lower legs are improved with fluocinonide ointment.  Also has moles to check.  H/o dysplastic nevus, H/o BCC L nasal ala, s/p Mohs 2/23   The following portions of the chart were reviewed this encounter and updated as appropriate:      Review of Systems: No other skin or systemic complaints except as noted in HPI or Assessment and Plan.   Objective  Well appearing patient in no apparent distress; mood and affect are within normal limits.  A focused examination was performed including face, scalp, legs, back, chest. Relevant physical exam findings are noted in the Assessment and Plan.  Scalp Mild erythema and scale at crown  Right Lower Leg - Anterior Clear today   Assessment & Plan  Seborrheic dermatitis Scalp  Chronic and persistent condition with duration or expected duration over one year. Condition is symptomatic/ bothersome to patient. Not currently at goal.   Seborrheic Dermatitis  -  is a chronic persistent rash characterized by pinkness and scaling most commonly of the mid face but also can occur on the scalp (dandruff), ears; mid chest, mid back and groin.  It tends to be exacerbated by stress and cooler weather.  People who have neurologic disease may experience new onset or exacerbation of existing seborrheic dermatitis.  The condition is not curable but treatable and can be controlled.   Continue 2% ketoconazole shampoo Massage into scalp and let sit several minutes before rinsing dsp 165m 3Rf.  Start Ketoconazole 2% cream - apply topically qd to scalp   continue Derma-Smoothe F/S oil Apply to scalp qd/bid prn itch dsp 1235m3Rf.  Try Cerave cream prn for dry skin     ketoconazole (NIZORAL) 2 % cream - Scalp Apply 1 Application topically See admin instructions. Apply 1 - 2 times daily  to scalp  Related Medications Fluocinolone Acetonide Body 0.01 % OIL Apply to scalp 1-2 times a day and leave on.  ketoconazole (NIZORAL) 2 % shampoo Massage into scalp daily and let sit several minutes before rinsing.  Dermatitis Right Lower Leg - Anterior  Resolved with topical steroid Fluocinonide ointment  Recommend mild soap and moisturizing cream 1-2 times daily.  Gentle skin care handout provided.  Recommend CeraVe or Eucerin cream  Melanocytic Nevi - Tan-brown and/or pink-flesh-colored symmetric macules and papules - Benign appearing on exam today, photos of trunk taken today - Observation - Call clinic for new or changing moles - Recommend daily use of broad spectrum spf 30+ sunscreen to sun-exposed areas.     Actinic Damage - chronic, secondary to cumulative UV radiation exposure/sun exposure over time - diffuse scaly erythematous macules with underlying dyspigmentation - Recommend daily broad spectrum sunscreen SPF 30+ to sun-exposed areas, reapply every 2 hours as needed.  - Recommend staying in the shade or wearing long sleeves, sun glasses (UVA+UVB protection) and wide brim hats (4-inch brim around the entire circumference of the hat). - Call for new or changing lesions.  History of Basal Cell Carcinoma of the Skin - No evidence of recurrence today L nasal ala - Recommend regular full body skin exams - Recommend daily broad spectrum sunscreen SPF 30+ to sun-exposed areas, reapply every 2 hours as needed.  - Call if any new or changing lesions are noted between office visits   Return for keep follow up  as schedule 8/29. I, Ruthell Rummage, CMA, am acting as scribe for Brendolyn Patty, MD.  Documentation: I have reviewed the above documentation for accuracy and completeness, and I agree with the above.  Brendolyn Patty MD

## 2022-05-11 NOTE — Progress Notes (Signed)
   Subjective:  Patient presents today status post right ankle arthroscopy with limited debridement. DOS: 04/06/2022.  Patient states that he is feeling well.  He states is doing okay.  His ankle is feeling a lot better.  He has not been compliant with wearing the boot.  Past Medical History:  Diagnosis Date   Anxiety    Arthritis    Asthma    Basal cell carcinoma 07/04/2021   L nasal ala. MOHS completed 12/13/2021   BPH (benign prostatic hyperplasia)    Colon adenomas    COPD (chronic obstructive pulmonary disease) ([place])    COVID    Depression    in the 1960's   Dysplastic nevus 02/28/2021   R scapula, mod atypia   Ear pain    GERD (gastroesophageal reflux disease)    Gout    Headache    History of kidney stones    Hypercholesteremia    Hypertension    Hypothyroidism    Neuropathy    Pneumonia    Sleep disorder       Objective/Physical Exam Neurovascular status intact.  Skin has completely epithelialized.  Good range of motion noted of the ankle joint.  Minimal pain.  No crepitus clinically appreciated   Assessment: 1. s/p ankle arthroscopy with limited debridement right. DOS: 04/06/2022   Plan of Care:  1. Patient was evaluated.  2.  Patient is doing well.  Minimal pain.  At this time patient is officially discharged from Dr. Amalia Hailey care.  If any foot and ankle issues arise future of asked him to come back and see Dr. Amalia Hailey.  He states understanding.

## 2022-06-07 ENCOUNTER — Inpatient Hospital Stay
Admission: EM | Admit: 2022-06-07 | Discharge: 2022-06-12 | DRG: 644 | Disposition: A | Discharge status: Home Health | Payer: 59 | Attending: Internal Medicine | Admitting: Internal Medicine

## 2022-06-07 ENCOUNTER — Emergency Department: Payer: 59

## 2022-06-07 DIAGNOSIS — G4700 Insomnia, unspecified: Secondary | ICD-10-CM | POA: Diagnosis present

## 2022-06-07 DIAGNOSIS — N400 Enlarged prostate without lower urinary tract symptoms: Secondary | ICD-10-CM | POA: Diagnosis present

## 2022-06-07 DIAGNOSIS — R109 Unspecified abdominal pain: Secondary | ICD-10-CM | POA: Diagnosis present

## 2022-06-07 DIAGNOSIS — E2740 Unspecified adrenocortical insufficiency: Principal | ICD-10-CM | POA: Diagnosis present

## 2022-06-07 DIAGNOSIS — R531 Weakness: Secondary | ICD-10-CM

## 2022-06-07 DIAGNOSIS — R079 Chest pain, unspecified: Secondary | ICD-10-CM | POA: Diagnosis present

## 2022-06-07 DIAGNOSIS — E669 Obesity, unspecified: Secondary | ICD-10-CM | POA: Diagnosis present

## 2022-06-07 DIAGNOSIS — G4733 Obstructive sleep apnea (adult) (pediatric): Secondary | ICD-10-CM | POA: Diagnosis present

## 2022-06-07 DIAGNOSIS — E7800 Pure hypercholesterolemia, unspecified: Secondary | ICD-10-CM | POA: Diagnosis present

## 2022-06-07 DIAGNOSIS — R001 Bradycardia, unspecified: Secondary | ICD-10-CM | POA: Diagnosis present

## 2022-06-07 DIAGNOSIS — Z6832 Body mass index (BMI) 32.0-32.9, adult: Secondary | ICD-10-CM

## 2022-06-07 DIAGNOSIS — I951 Orthostatic hypotension: Secondary | ICD-10-CM | POA: Diagnosis present

## 2022-06-07 DIAGNOSIS — N1832 Chronic kidney disease, stage 3b: Secondary | ICD-10-CM

## 2022-06-07 DIAGNOSIS — Z79899 Other long term (current) drug therapy: Secondary | ICD-10-CM

## 2022-06-07 DIAGNOSIS — M109 Gout, unspecified: Secondary | ICD-10-CM | POA: Diagnosis present

## 2022-06-07 DIAGNOSIS — I10 Essential (primary) hypertension: Secondary | ICD-10-CM | POA: Diagnosis present

## 2022-06-07 DIAGNOSIS — N179 Acute kidney failure, unspecified: Secondary | ICD-10-CM | POA: Diagnosis present

## 2022-06-07 DIAGNOSIS — E860 Dehydration: Secondary | ICD-10-CM

## 2022-06-07 DIAGNOSIS — K219 Gastro-esophageal reflux disease without esophagitis: Secondary | ICD-10-CM | POA: Diagnosis present

## 2022-06-07 DIAGNOSIS — E039 Hypothyroidism, unspecified: Secondary | ICD-10-CM | POA: Diagnosis present

## 2022-06-07 DIAGNOSIS — Z791 Long term (current) use of non-steroidal anti-inflammatories (NSAID): Secondary | ICD-10-CM

## 2022-06-07 DIAGNOSIS — R42 Dizziness and giddiness: Secondary | ICD-10-CM

## 2022-06-07 DIAGNOSIS — G43909 Migraine, unspecified, not intractable, without status migrainosus: Secondary | ICD-10-CM | POA: Diagnosis present

## 2022-06-07 DIAGNOSIS — I129 Hypertensive chronic kidney disease with stage 1 through stage 4 chronic kidney disease, or unspecified chronic kidney disease: Secondary | ICD-10-CM | POA: Diagnosis present

## 2022-06-07 DIAGNOSIS — F250 Schizoaffective disorder, bipolar type: Secondary | ICD-10-CM | POA: Diagnosis present

## 2022-06-07 DIAGNOSIS — Z7982 Long term (current) use of aspirin: Secondary | ICD-10-CM

## 2022-06-07 DIAGNOSIS — G629 Polyneuropathy, unspecified: Secondary | ICD-10-CM | POA: Diagnosis present

## 2022-06-07 DIAGNOSIS — Z85828 Personal history of other malignant neoplasm of skin: Secondary | ICD-10-CM

## 2022-06-07 DIAGNOSIS — J449 Chronic obstructive pulmonary disease, unspecified: Secondary | ICD-10-CM | POA: Diagnosis present

## 2022-06-07 DIAGNOSIS — W19XXXA Unspecified fall, initial encounter: Secondary | ICD-10-CM

## 2022-06-07 DIAGNOSIS — Z79890 Hormone replacement therapy (postmenopausal): Secondary | ICD-10-CM

## 2022-06-07 DIAGNOSIS — F419 Anxiety disorder, unspecified: Secondary | ICD-10-CM | POA: Diagnosis present

## 2022-06-07 LAB — CBC WITH DIFFERENTIAL/PLATELET
Abs Immature Granulocytes: 0.11 10*3/uL — ABNORMAL HIGH (ref 0.00–0.07)
Basophils Absolute: 0 10*3/uL (ref 0.0–0.1)
Basophils Relative: 1 %
Eosinophils Absolute: 0.3 10*3/uL (ref 0.0–0.5)
Eosinophils Relative: 5 %
HCT: 38.2 % — ABNORMAL LOW (ref 39.0–52.0)
Hemoglobin: 12.5 g/dL — ABNORMAL LOW (ref 13.0–17.0)
Immature Granulocytes: 2 %
Lymphocytes Relative: 16 %
Lymphs Abs: 1.1 10*3/uL (ref 0.7–4.0)
MCH: 30.4 pg (ref 26.0–34.0)
MCHC: 32.7 g/dL (ref 30.0–36.0)
MCV: 92.9 fL (ref 80.0–100.0)
Monocytes Absolute: 0.6 10*3/uL (ref 0.1–1.0)
Monocytes Relative: 9 %
Neutro Abs: 4.5 10*3/uL (ref 1.7–7.7)
Neutrophils Relative %: 67 %
Platelets: 177 10*3/uL (ref 150–400)
RBC: 4.11 MIL/uL — ABNORMAL LOW (ref 4.22–5.81)
RDW: 13.4 % (ref 11.5–15.5)
WBC: 6.7 10*3/uL (ref 4.0–10.5)
nRBC: 0 % (ref 0.0–0.2)

## 2022-06-07 LAB — COMPREHENSIVE METABOLIC PANEL
ALT: 18 U/L (ref 0–44)
AST: 20 U/L (ref 15–41)
Albumin: 3.6 g/dL (ref 3.5–5.0)
Alkaline Phosphatase: 61 U/L (ref 38–126)
Anion gap: 5 (ref 5–15)
BUN: 37 mg/dL — ABNORMAL HIGH (ref 8–23)
CO2: 27 mmol/L (ref 22–32)
Calcium: 8.6 mg/dL — ABNORMAL LOW (ref 8.9–10.3)
Chloride: 103 mmol/L (ref 98–111)
Creatinine, Ser: 2.01 mg/dL — ABNORMAL HIGH (ref 0.61–1.24)
GFR, Estimated: 34 mL/min — ABNORMAL LOW (ref >60)
Glucose, Bld: 122 mg/dL — ABNORMAL HIGH (ref 70–99)
Potassium: 4.5 mmol/L (ref 3.5–5.1)
Sodium: 135 mmol/L (ref 135–145)
Total Bilirubin: 0.6 mg/dL (ref 0.3–1.2)
Total Protein: 6.5 g/dL (ref 6.5–8.1)

## 2022-06-07 LAB — URINALYSIS, ROUTINE W REFLEX MICROSCOPIC
Bilirubin Urine: NEGATIVE
Glucose, UA: NEGATIVE mg/dL
Hgb urine dipstick: NEGATIVE
Ketones, ur: NEGATIVE mg/dL
Leukocytes,Ua: NEGATIVE
Nitrite: NEGATIVE
Protein, ur: NEGATIVE mg/dL
Specific Gravity, Urine: 1.012 (ref 1.005–1.030)
pH: 5 (ref 5.0–8.0)

## 2022-06-07 LAB — TROPONIN I (HIGH SENSITIVITY)
Troponin I (High Sensitivity): 3 ng/L (ref <18)
Troponin I (High Sensitivity): 3 ng/L (ref <18)

## 2022-06-07 MED ORDER — SODIUM CHLORIDE 0.9 % IV SOLN: INTRAVENOUS | Status: DC

## 2022-06-07 MED ORDER — IPRATROPIUM-ALBUTEROL 0.5-2.5 (3) MG/3ML IN SOLN
3.0000 mL | Freq: Once | RESPIRATORY_TRACT | Status: AC
  Administered 2022-06-07: 3 mL via RESPIRATORY_TRACT
  Filled 2022-06-07: qty 3

## 2022-06-07 MED ORDER — SODIUM CHLORIDE 0.9 % IV BOLUS
1000.0000 mL | Freq: Once | INTRAVENOUS | Status: AC
  Administered 2022-06-07: 1000 mL via INTRAVENOUS

## 2022-06-07 NOTE — H&P (Signed)
History and Physical    Dakota Harper NAT:[id number] DOB: [birth year] DOA: 06/07/2022  PCP: [name], NP    Patient coming from:  Home    Chief Complaint:  Generalized weakness  Dizziness. Chest pain. Hypotension.    HPI:  Dakota Harper is a 76 y.o. male seen in ed with complaints of generalized weakness since past 2 days , he noticed he was getting weak while  Getting up to ambulate and had to hold on to side rails.  And at facility his SBP was noted to be 90's. Patient states he did not fall or hit his head or anything like that he did not have any symptoms. Otherwise patient does not report any headaches blurred vision chest pain shortness of breath currently. Patient also does not report any falls abdominal pain bowel or bladder changes. He did report an episode where he had left-sided precordial chest pain off and on that he had earlier today but it resolved. Patient does have a cardiologist at [facility] clinic that he is to see.  Pt has past medical history of anxiety, hypertension, COPD, GERD, hypothyroidism, history of kidney stones, BPH and, allergies to saw palmetto, sulfa antibiotics.  ED Course:   Vitals:   06/07/22 2130 06/07/22 2330 06/08/22 0000 06/08/22 0100  BP: 134/64 (!) 161/75 133/72 136/65  Pulse: 69 78 78 76  Resp: 14 (!) 21 11   Temp:      TempSrc:      SpO2: 100% 100% 100% 100%  In ed pt is A/Ox 3 and give history. Vitals stable.  Labs shows: Lab Results  Component Value Date   CREATININE 2.01 (H) 06/07/2022   CREATININE 1.45 (H) 12/19/2021   CREATININE 1.74 (H) 03/19/2021  Glucose 122. Cbc shows mild anemia with hb of 12.5. EKG shows a junctional rhythm at 70 QTc of 513.      Review of Systems:  Review of Systems  Cardiovascular:  Positive for chest pain.  Neurological:  Positive for dizziness and weakness.  All other systems reviewed and are negative.  Past Medical History:  Diagnosis Date   Anxiety    Arthritis     Asthma    Basal cell carcinoma 07/04/2021   L nasal ala. 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   R scapula, mod atypia   Ear pain    GERD (gastroesophageal reflux disease)    Gout    Headache    History of kidney stones    Hypercholesteremia    Hypertension    Hypothyroidism    Neuropathy    Pneumonia    Sleep disorder     Past Surgical History:  Procedure Laterality Date   ADJACENT TISSUE TRANSFER/TISSUE REARRANGEMENT Left 12/19/2021   Procedure: ADJACENT TISSUE TRANSFER/TISSUE REARRANGEMENT;  Surgeon: Cindra Presume, MD;  Location: [place];  Service: Plastics;  Laterality: Left;   CHOLECYSTECTOMY     COLONOSCOPY     COLONOSCOPY WITH PROPOFOL N/A 08/01/2021   Procedure: COLONOSCOPY WITH PROPOFOL;  Surgeon: Jonathon Bellows, MD;  Location: [facility] ENDOSCOPY;  Service: Gastroenterology;  Laterality: N/A;   ESOPHAGOGASTRODUODENOSCOPY (EGD) WITH PROPOFOL N/A 11/30/2021   Procedure: ESOPHAGOGASTRODUODENOSCOPY (EGD) WITH PROPOFOL;  Surgeon: Jonathon Bellows, MD;  Location: [facility] ENDOSCOPY;  Service: Gastroenterology;  Laterality: N/A;   HERNIA REPAIR  NASAL RECONSTRUCTION Left 12/19/2021   Procedure: NASAL RECONSTRUCTION;  Surgeon: Cindra Presume, MD;  Location: [place];  Service: Plastics;  Laterality: Left;  2 hours   pituitary tumor excision       reports that he has quit smoking. His smoking use included cigarettes. He has never used smokeless tobacco. He reports that he does not drink alcohol and does not use drugs.  Allergies  Allergen Reactions   Saw Palmetto Hives and Rash    "Berry form"   Other Other (See Comments) and Rash   Sulfa Antibiotics Rash   Sulfacetamide Sodium Rash    Family History  Problem Relation Age of Onset   Prostate cancer Neg Hx    Bladder Cancer Neg Hx    Kidney cancer Neg Hx      Prior to Admission medications   Medication Sig Start Date End Date Taking? Authorizing Provider  acetaminophen (TYLENOL) 500 MG tablet Take 500 mg by mouth every 4 (four) hours as needed for moderate pain or mild pain.    [provider]  albuterol (PROVENTIL HFA;VENTOLIN HFA) 108 (90 BASE) MCG/ACT inhaler Inhale 1 puff into the lungs 3 (three) times daily as needed for wheezing or shortness of breath.    [provider]  alum & mag hydroxide-simeth (MAALOX/MYLANTA) 200-200-20 MG/5ML suspension Take 30 mLs by mouth 4 (four) times daily as needed for indigestion or heartburn.    [provider]  aspirin EC 81 MG tablet Take 81 mg by mouth at bedtime. (2100)    [provider]  atorvastatin (LIPITOR) 10 MG tablet Take 10 mg by mouth every evening. (2000)    [provider]  carbamide peroxide (EAR WAX REMOVAL DROPS) 6.5 % OTIC solution Place 5 drops into both ears every Sunday.    [provider]  chlorhexidine (PERIDEX) 0.12 % solution Use as directed 15 mLs in the mouth or throat 2 (two) times daily as needed (oral discomfort).    [provider]  colchicine 0.6 MG tablet Take 1.'2mg'$  (2 tablets) then 0.'6mg'$  (1 tablet) 1 hour after. Then, take 1 tablet every day for 7 days. Patient taking differently: Take 0.6 mg by mouth daily. Take 1 tablet every day for 7 days. 12/12/21   McDonald, Stephan Minister, DPM  cycloSPORINE (RESTASIS) 0.05 % ophthalmic emulsion Place 1 drop into both eyes 2 (two) times daily as needed (dry eyes).    [provider]  diclofenac Sodium (VOLTAREN) 1 % GEL Apply 2 g topically as needed (lower left leg pain.). 03/31/21   [provider]  diphenhydrAMINE (BENADRYL) 25 MG tablet Take 25 mg by mouth every 6 (six) hours as needed (allergic reaction).    [provider]  fenofibrate 54 MG tablet Take 1 tablet (54 mg total) by mouth daily. 11/01/20   Sidney Ace, MD  finasteride (PROSCAR) 5 MG  tablet Take 1 tablet (5 mg total) by mouth daily. 01/22/20   Zara Council A, PA-C  Fluocinolone Acetonide Body 0.01 % OIL Apply to scalp 1-2 times a day and leave on. 02/28/21   Brendolyn Patty, MD  fluocinonide ointment (LIDEX) 8.76 % Apply 1 application topically 2 (two) times daily as needed (pruritus). (apply to legs)    [provider]  fluticasone furoate-vilanterol (BREO ELLIPTA) 200-25 MCG/INH AEPB Inhale 1 puff into the lungs daily. (0800) 08/10/20   [provider]  gabapentin (NEURONTIN) 400 MG capsule Take 400 mg by mouth at bedtime. (2000) 11/08/21   [provider]  guaiFENesin (ROBITUSSIN) 100 MG/5ML liquid Take 300 mg by mouth every 6 (six) hours as needed for cough.    [provider]  ketoconazole (NIZORAL) 2 % cream Apply 1 Application topically See admin instructions. Apply 1 - 2 times daily to scalp 05/11/22 06/22/22  Brendolyn Patty, MD  ketoconazole (NIZORAL) 2 % shampoo Massage into scalp daily and let sit several minutes before rinsing. Patient taking differently: 1 application  every Monday, Wednesday, and Friday. Massage into scalp daily and let sit 5-10  minutes before rinsing. 10/12/21   Brendolyn Patty, MD  lactulose (CHRONULAC) 10 GM/15ML solution Take 30 mLs (20 g total) by mouth daily as needed for mild constipation. 03/20/21   Paulette Blanch, MD  levothyroxine (SYNTHROID, LEVOTHROID) 50 MCG tablet Take 50 mcg by mouth daily before breakfast. (0630)    [provider]  loperamide (IMODIUM) 2 MG capsule Take 4 mg by mouth as needed for diarrhea or loose stools.    [provider]  losartan (COZAAR) 25 MG tablet Take 25 mg by mouth daily. (0800)    [provider]  magnesium hydroxide (MILK OF MAGNESIA) 400 MG/5ML suspension Take 30 mLs by mouth daily as needed (constipation (no bowel movement for 3 days)).    [provider]  meloxicam (MOBIC) 15 MG tablet Take 1 tablet (15 mg total) by mouth daily. 04/06/22    Edrick Kins, DPM  methylPREDNISolone (MEDROL DOSEPAK) 4 MG TBPK tablet Take as directed 12/06/21   Felipa Furnace, DPM  mirabegron ER (MYRBETRIQ) 50 MG TB24 tablet Take 50 mg by mouth daily. (0900)    [provider]  montelukast (SINGULAIR) 5 MG chewable tablet Chew 5 mg by mouth at bedtime. (2100)    [provider]  nortriptyline (PAMELOR) 75 MG capsule Take 150 mg by mouth daily. (0900)    [provider]  omeprazole (PRILOSEC) 40 MG capsule Take 1 capsule (40 mg total) by mouth daily. 11/16/21   Jonathon Bellows, MD  ondansetron (ZOFRAN-ODT) 4 MG disintegrating tablet Take 1 tablet (4 mg total) by mouth every 8 (eight) hours as needed for nausea or vomiting. 12/07/21   Corena Herter, PA-C  oxyCODONE-acetaminophen (PERCOCET) 5-325 MG tablet Take 1 tablet by mouth every 4 (four) hours as needed for severe pain. 04/06/22   Edrick Kins, DPM  polyethylene glycol (MIRALAX / GLYCOLAX) 17 g packet Take 17 g by mouth daily as needed (constipation.).    [provider]  Skin Protectants, Misc. (EUCERIN) cream Apply 1 application topically in the morning and at bedtime. (0800 & 2000)    [provider]  tamsulosin (FLOMAX) 0.4 MG CAPS capsule Take 0.4 mg by mouth at bedtime. (2100)    [provider]  tiZANidine (ZANAFLEX) 2 MG tablet Take 2 mg by mouth at bedtime. (2000)    [provider]  white petrolatum (VASELINE) GEL Apply 1 application topically as directed. Apply vaseline to skin lesions and cover with band-aid until healed    [provider]    Physical Exam: Vitals:   06/07/22 2130 06/07/22 2330 06/08/22 0000 06/08/22 0100  BP: 134/64 (!) 161/75 133/72 136/65  Pulse: 69 78 78 76  Resp: 14 (!) 21 11   Temp:      TempSrc:      SpO2: 100% 100% 100% 100%   Physical Exam Vitals and nursing note reviewed.  Constitutional:      General: He is not in acute distress.  Appearance: Normal appearance. He is not  ill-appearing, toxic-appearing or diaphoretic.  HENT:     Head: Normocephalic and atraumatic.     Right Ear: Hearing and external ear normal.     Left Ear: Hearing and external ear normal.     Nose: Nose normal. No nasal deformity.     Mouth/Throat:     Lips: Pink.     Mouth: Mucous membranes are moist.     Tongue: No lesions.  Eyes:     Extraocular Movements: Extraocular movements intact.     Pupils: Pupils are equal, round, and reactive to light.  Cardiovascular:     Rate and Rhythm: Normal rate and regular rhythm.     Pulses: Normal pulses.     Heart sounds: Murmur heard.  Pulmonary:     Effort: Pulmonary effort is normal.     Breath sounds: Normal breath sounds.  Abdominal:     General: Bowel sounds are normal. There is no distension.     Palpations: Abdomen is soft. There is no mass.     Tenderness: There is no abdominal tenderness. There is no guarding.     Hernia: No hernia is present.  Musculoskeletal:     Right lower leg: No edema.     Left lower leg: No edema.  Skin:    General: Skin is warm.  Neurological:     General: No focal deficit present.     Mental Status: He is alert and oriented to person, place, and time.     Cranial Nerves: Cranial nerves 2-12 are intact.     Motor: Motor function is intact.  Psychiatric:        Attention and Perception: Attention normal.        Mood and Affect: Mood normal.        Speech: Speech normal.        Behavior: Behavior normal. Behavior is cooperative.        Cognition and Memory: Cognition normal.     Labs on Admission: I have personally reviewed following labs and imaging studies BMET Recent Labs  Lab 06/07/22 1850  NA 135  K 4.5  CL 103  CO2 27  BUN 37*  CREATININE 2.01*  GLUCOSE 122*   Electrolytes Recent Labs  Lab 06/07/22 1850  CALCIUM 8.6*   Sepsis Markers No results for input(s): "LATICACIDVEN", "PROCALCITON", "O2SATVEN" in the last 168 hours. ABG No results for input(s): "PHART", "PCO2ART",  "PO2ART" in the last 168 hours. Liver Enzymes Recent Labs  Lab 06/07/22 1850  AST 20  ALT 18  ALKPHOS 61  BILITOT 0.6  ALBUMIN 3.6   Cardiac Enzymes No results for input(s): "TROPONINI", "PROBNP" in the last 168 hours. No results found for: "DDIMER" Coag's No results for input(s): "APTT", "INR" in the last 168 hours.  No results found for this or any previous visit (from the past 240 hour(s)).   Current Facility-Administered Medications:    0.9 %  sodium chloride infusion, , Intravenous, Continuous, Duffy Bruce, MD   0.9 %  sodium chloride infusion, , Intravenous, Continuous, Posey Pronto, Gretta Cool, MD   acetaminophen (TYLENOL) tablet 650 mg, 650 mg, Oral, Q6H PRN **OR** acetaminophen (TYLENOL) suppository 650 mg, 650 mg, Rectal, Q6H PRN, Posey Pronto, Dakai Braithwaite V, MD   albuterol (PROVENTIL) (2.5 MG/3ML) 0.083% nebulizer solution 2.5 mg, 2.5 mg, Inhalation, TID PRN, Para Skeans, MD   aspirin EC tablet 81 mg, 81 mg, Oral, QHS, Lujean Ebright V, MD   atorvastatin (LIPITOR) tablet 10 mg,  10 mg, Oral, QPM, Latayna Ritchie V, MD   betamethasone acetate-betamethasone sodium phosphate (CELESTONE) injection 3 mg, 3 mg, Intra-articular, Once, Evans, Brent M, DPM   fenofibrate tablet 54 mg, 54 mg, Oral, Daily, Posey Pronto, Gretta Cool, MD   finasteride (PROSCAR) tablet 5 mg, 5 mg, Oral, Daily, Posey Pronto, Gretta Cool, MD   heparin injection 5,000 Units, 5,000 Units, Subcutaneous, Q12H, Posey Pronto, Gretta Cool, MD   hydrALAZINE (APRESOLINE) injection 10 mg, 10 mg, Intravenous, Q4H PRN, Para Skeans, MD   levothyroxine (SYNTHROID) tablet 50 mcg, 50 mcg, Oral, Q0600, Para Skeans, MD   mirabegron ER (MYRBETRIQ) tablet 50 mg, 50 mg, Oral, Daily, Alycen Mack, Gretta Cool, MD   montelukast (SINGULAIR) tablet 5 mg, 5 mg, Oral, QHS, Roniel Halloran V, MD   morphine (PF) 2 MG/ML injection 1 mg, 1 mg, Intravenous, Q4H PRN, Para Skeans, MD   nitroGLYCERIN (NITROSTAT) SL tablet 0.4 mg, 0.4 mg, Sublingual, Q5 min PRN, Para Skeans, MD   nortriptyline (PAMELOR)  capsule 150 mg, 150 mg, Oral, Daily, Anton Cheramie V, MD   sodium chloride flush (NS) 0.9 % injection 3 mL, 3 mL, Intravenous, Q12H, Kieon Lawhorn V, MD   tamsulosin (FLOMAX) capsule 0.4 mg, 0.4 mg, Oral, QHS, Lala Been V, MD   valproic acid (DEPAKENE) 250 MG capsule 250 mg, 250 mg, Oral, BID, Para Skeans, MD  Current Outpatient Medications:    aspirin EC 81 MG tablet, Take 81 mg by mouth at bedtime. (2100), Disp: , Rfl:    atorvastatin (LIPITOR) 10 MG tablet, Take 10 mg by mouth every evening. (2000), Disp: , Rfl:    carbamide peroxide (EAR WAX REMOVAL DROPS) 6.5 % OTIC solution, Place 5 drops into both ears every Sunday., Disp: , Rfl:    fenofibrate 54 MG tablet, Take 1 tablet (54 mg total) by mouth daily., Disp: , Rfl:    finasteride (PROSCAR) 5 MG tablet, Take 1 tablet (5 mg total) by mouth daily., Disp: 90 tablet, Rfl: 3   fluticasone furoate-vilanterol (BREO ELLIPTA) 200-25 MCG/INH AEPB, Inhale 1 puff into the lungs daily. (0800), Disp: , Rfl:    gabapentin (NEURONTIN) 400 MG capsule, Take 400 mg by mouth at bedtime. (2000), Disp: , Rfl:    ketoconazole (NIZORAL) 2 % cream, Apply 1 Application topically See admin instructions. Apply 1 - 2 times daily to scalp, Disp: 60 g, Rfl: 3   levothyroxine (SYNTHROID, LEVOTHROID) 50 MCG tablet, Take 50 mcg by mouth daily before breakfast. (0630), Disp: , Rfl:    losartan (COZAAR) 25 MG tablet, Take 25 mg by mouth daily. (0800), Disp: , Rfl:    meloxicam (MOBIC) 15 MG tablet, Take 1 tablet (15 mg total) by mouth daily., Disp: 30 tablet, Rfl: 1   mirabegron ER (MYRBETRIQ) 50 MG TB24 tablet, Take 50 mg by mouth daily. (0900), Disp: , Rfl:    montelukast (SINGULAIR) 5 MG chewable tablet, Chew 5 mg by mouth at bedtime. (2100), Disp: , Rfl:    nortriptyline (PAMELOR) 75 MG capsule, Take 150 mg by mouth daily. (0900), Disp: , Rfl:    omeprazole (PRILOSEC) 40 MG capsule, Take 1 capsule (40 mg total) by mouth daily., Disp: 90 capsule, Rfl: 0   paliperidone  (INVEGA) 3 MG 24 hr tablet, Take 3 mg by mouth at bedtime., Disp: , Rfl:    Skin Protectants, Misc. (EUCERIN) cream, Apply 1 application topically in the morning and at bedtime. (0800 & 2000), Disp: , Rfl:    tamsulosin (FLOMAX) 0.4 MG CAPS  capsule, Take 0.4 mg by mouth at bedtime. (2100), Disp: , Rfl:    tiZANidine (ZANAFLEX) 2 MG tablet, Take 2 mg by mouth at bedtime. (2000), Disp: , Rfl:    valproic acid (DEPAKENE) 250 MG capsule, Take 250 mg by mouth 2 (two) times daily., Disp: , Rfl:    white petrolatum (VASELINE) GEL, Apply 1 application topically as directed. Apply vaseline to skin lesions and cover with band-aid until healed, Disp: , Rfl:    acetaminophen (TYLENOL) 500 MG tablet, Take 500 mg by mouth every 4 (four) hours as needed for moderate pain or mild pain., Disp: , Rfl:    albuterol (PROVENTIL HFA;VENTOLIN HFA) 108 (90 BASE) MCG/ACT inhaler, Inhale 1 puff into the lungs 3 (three) times daily as needed for wheezing or shortness of breath., Disp: , Rfl:    alum & mag hydroxide-simeth (MAALOX/MYLANTA) 200-200-20 MG/5ML suspension, Take 30 mLs by mouth 4 (four) times daily as needed for indigestion or heartburn., Disp: , Rfl:    chlorhexidine (PERIDEX) 0.12 % solution, Use as directed 15 mLs in the mouth or throat 2 (two) times daily as needed (oral discomfort)., Disp: , Rfl:    colchicine 0.6 MG tablet, Take 1.'2mg'$  (2 tablets) then 0.'6mg'$  (1 tablet) 1 hour after. Then, take 1 tablet every day for 7 days. (Patient not taking: Reported on 06/07/2022), Disp: 10 tablet, Rfl: 2   cycloSPORINE (RESTASIS) 0.05 % ophthalmic emulsion, Place 1 drop into both eyes 2 (two) times daily as needed (dry eyes)., Disp: , Rfl:    diclofenac Sodium (VOLTAREN) 1 % GEL, Apply 2 g topically as needed (lower left leg pain.)., Disp: , Rfl:    diphenhydrAMINE (BENADRYL) 25 MG tablet, Take 25 mg by mouth every 6 (six) hours as needed (allergic reaction)., Disp: , Rfl:    Fluocinolone Acetonide Body 0.01 % OIL, Apply to  scalp 1-2 times a day and leave on., Disp: 120 mL, Rfl: 3   fluocinonide ointment (LIDEX) 3.53 %, Apply 1 application topically 2 (two) times daily as needed (pruritus). (apply to legs), Disp: , Rfl:    guaiFENesin (ROBITUSSIN) 100 MG/5ML liquid, Take 300 mg by mouth every 6 (six) hours as needed for cough., Disp: , Rfl:    ketoconazole (NIZORAL) 2 % shampoo, Massage into scalp daily and let sit several minutes before rinsing. (Patient not taking: Reported on 06/07/2022), Disp: 120 mL, Rfl: 4   lactulose (CHRONULAC) 10 GM/15ML solution, Take 30 mLs (20 g total) by mouth daily as needed for mild constipation., Disp: 120 mL, Rfl: 0   loperamide (IMODIUM) 2 MG capsule, Take 4 mg by mouth as needed for diarrhea or loose stools., Disp: , Rfl:    magnesium hydroxide (MILK OF MAGNESIA) 400 MG/5ML suspension, Take 30 mLs by mouth daily as needed (constipation (no bowel movement for 3 days))., Disp: , Rfl:    methylPREDNISolone (MEDROL DOSEPAK) 4 MG TBPK tablet, Take as directed (Patient not taking: Reported on 06/07/2022), Disp: 21 each, Rfl: 0   ondansetron (ZOFRAN-ODT) 4 MG disintegrating tablet, Take 1 tablet (4 mg total) by mouth every 8 (eight) hours as needed for nausea or vomiting., Disp: 20 tablet, Rfl: 0   oxyCODONE-acetaminophen (PERCOCET) 5-325 MG tablet, Take 1 tablet by mouth every 4 (four) hours as needed for severe pain., Disp: 30 tablet, Rfl: 0   polyethylene glycol (MIRALAX / GLYCOLAX) 17 g packet, Take 17 g by mouth daily as needed (constipation.). (Patient not taking: Reported on 06/07/2022), Disp: , Rfl:   COVID-19 Labs  No results for input(s): "DDIMER", "FERRITIN", "LDH", "CRP" in the last 72 hours. Lab Results  Component Value Date   SARSCOV2NAA NEGATIVE 10/27/2020   [place] NEGATIVE 10/15/2020   [place] NEGATIVE 11/25/2019    Radiological Exams on Admission: CT Chest Wo Contrast  Result Date: 06/07/2022 CLINICAL DATA:  Respiratory difficulty EXAM: CT CHEST WITHOUT CONTRAST  TECHNIQUE: Multidetector CT imaging of the chest was performed following the standard protocol without IV contrast. RADIATION DOSE REDUCTION: This exam was performed according to the departmental dose-optimization program which includes automated exposure control, adjustment of the mA and/or kV according to patient size and/or use of iterative reconstruction technique. COMPARISON:  Chest x-ray from earlier in the same day. FINDINGS: Cardiovascular: Limited due to lack of IV contrast. Aberrant right subclavian artery is noted. Atherosclerotic calcifications of the aorta and its branches are seen. No aneurysmal dilatation is noted. No cardiac enlargement is seen. Scattered coronary calcifications are noted. Mediastinum/Nodes: Thoracic inlet is within normal limits. No hilar or mediastinal adenopathy is noted. The esophagus is within normal limits. Lungs/Pleura: Lungs are well aerated bilaterally. Mild dependent scarring is noted bilaterally. No focal confluent infiltrate or sizable parenchymal nodules are seen. No effusion is noted. Upper Abdomen: Visualized upper abdomen shows no acute abnormality. Musculoskeletal: Degenerative changes of the thoracic spine are seen. No acute rib abnormality is noted. IMPRESSION: Bilateral parenchymal scarring without acute infiltrate. Aberrant right subclavian artery. Aortic Atherosclerosis (ICD10-I70.0). Electronically Signed   By: Inez Catalina M.D.   On: 06/07/2022 22:09   CT HEAD WO CONTRAST (5MM)  Result Date: 06/07/2022 CLINICAL DATA:  Fall yesterday with increased weakness. History of stroke. EXAM: CT HEAD WITHOUT CONTRAST TECHNIQUE: Contiguous axial images were obtained from the base of the skull through the vertex without intravenous contrast. RADIATION DOSE REDUCTION: This exam was performed according to the departmental dose-optimization program which includes automated exposure control, adjustment of the mA and/or kV according to patient size and/or use of iterative  reconstruction technique. COMPARISON:  None Available. FINDINGS: Brain: There is no acute intracranial hemorrhage, extra-axial fluid collection, or acute infarct Parenchymal volume is normal. The ventricles are normal in size. Gray-white differentiation is preserved. There is no mass lesion.  There is no mass effect or midline shift. Vascular: There is calcification of the bilateral cavernous ICAs. Skull: Normal. Negative for fracture or focal lesion. Sinuses/Orbits: Postsurgical changes are noted in the sinonasal cavity with mild mucosal thickening in the postsurgical sphenoid sinus. The globes and orbits are unremarkable. Other: None. IMPRESSION: No acute intracranial pathology. Electronically Signed   By: Valetta Mole M.D.   On: 06/07/2022 18:31   DG Chest 2 View  Result Date: 06/07/2022 CLINICAL DATA:  Chest pain, weakness, and dehydration. EXAM: CHEST - 2 VIEW COMPARISON:  10/27/2020 FINDINGS: Shallow inspiration. Linear atelectasis or scarring in the lung bases. Heart size and pulmonary vascularity are normal. Previous infiltrates have improved. No developing consolidation or edema. No pleural effusions. No pneumothorax. Mediastinal contours appear intact. Calcification of the aorta. Degenerative changes in the spine and shoulders. IMPRESSION: Shallow inspiration with linear fibrosis or atelectasis in the lung bases. No focal consolidation or edema. Electronically Signed   By: Lucienne Capers M.D.   On: 06/07/2022 18:28    EKG: Independently reviewed.  As above junctional rhythm at 70 with rbbb, prolong qtc.   Assessment and Plan: * Generalized weakness Due to multiple factors esp his hypotension and prolong qtc on ekg.  Fall precaution.Follow BP .  Orthostatic hypotension Due to dehydration meds and  decreased PO intake.  We will hold home bp regimen and reintroduce at lower dose.  Vitals:   06/07/22 1736 06/07/22 1848 06/07/22 1930 06/07/22 2000  BP: 122/70 (!) 151/67 (!) 144/66 (!) 163/76    06/07/22 2030 06/07/22 2100 06/07/22 2130 06/07/22 2330  BP: (!) 151/75 (!) 171/71 134/64 (!) 161/75   06/08/22 0000 06/08/22 0100  BP: 133/72 136/65      Dizziness Due to LOW bp and orthostatic BP due to meds and dehydration.  Fall precaution. Orthostatic vitals q shift x 2.    Chest pain Pt has had chest pain episodes and TNI negative so far.  We will consult cardiology, he is seeing [facility]. Am message sent to dr Clayborn Bigness.  PRN ntg and morphine. Am lipid and TFT.   Hypertension, benign PRN hydralazine. Home losartan held due to Orthostatic BP and AKI on CKD.    Acute renal failure superimposed on stage 3a chronic kidney disease ([place]) Lab Results  Component Value Date   CREATININE 2.01 (H) 06/07/2022   CREATININE 1.45 (H) 12/19/2021   CREATININE 1.74 (H) 03/19/2021  AKI on CKD stage 3 b. Avoid contrast. Renally dose meds.  Hold losartan.    Bilateral flank pain CT abd noncontrast for ? Stones or hydronephrosis.   BPH (benign prostatic hyperplasia) Cont proscar and flomax and follow. BP for fluctuations.  Ct abd noncontrast for AKI ? Hydronephrosis.   Hypothyroidism tft . Cont levothyroxine at 50 [facility].   OSA (obstructive sleep apnea) cpap per home settings.      DVT prophylaxis:  Heparin    Code Status:  Full code    Family Communication:  ROSE, RACHEL (Friend)  [phone] (Mobile)   Disposition Plan:  SNF.   Consults called:  Cardiology: Dr.Callwood.   Admission status: Observation.       Para Skeans MD [facility]  6 PM- 2 AM. Please contact me via secure Chat 6 PM-2 AM. [phone] ( Pager ) To contact the [facility] Attending or Consulting provider [place] or covering provider during after hours [place], for this patient.   Check the care team in [facility] and look for a) attending/consulting [facility] provider listed and b) the [facility] team listed Log into [email or web address] and use [place] universal password to access. If you  do not have the password, please contact the hospital operator. Locate the [facility] provider you are looking for under [facility] and page to a number that you can be directly reached. If you still have difficulty reaching the provider, please page the [facility] (Director on Call) for the Hospitalists listed on amion for assistance. [email or web address] 06/08/2022, 1:40 AM

## 2022-06-07 NOTE — ED Notes (Signed)
Pt ambulated in unit, pt maintained oxygen level of 97% or higher, HR ranged from 75-80, pt denies feeling dizzy or short of breath. Pt almost lost balance and had to be held upright once.

## 2022-06-07 NOTE — ED Provider Notes (Signed)
[facility] Provider Note    Event Date/Time   First MD Initiated Contact with Patient 06/07/22 1732     (approximate)   History   Dehydration (C/o weakness and dizziness, fall yesterday, did not hit head, chest pain on and off X1 month, positive ortho's with EMS)   HPI  Dakota Harper is a 76 y.o. male  with ho depression, anxiety, htn, hld, bph, copd, here with weakness and dizziness.    Pt states that he fell yesterday but has felt weak for several days. Lives in ALF. States his legs gave out. Did not hit his head or hurting himself. He states the weakness is generalized. No vertigo, vision changes. No recent fevers. He has had some CP off and on for a month and did have it this AM. No vomiting. Per EMS, pt was significantly orthostatic on their assessment, 80/50 while standing. Has not seen a doctor for this CP. Describes it as in his left pectoral muscle.      Physical Exam   Triage Vital Signs: ED Triage Vitals  Enc Vitals Group     BP 06/07/22 1736 122/70     Pulse Rate 06/07/22 1736 70     Resp 06/07/22 1736 18     Temp --      Temp src --      SpO2 06/07/22 1736 100 %     Weight --      Height --      Head Circumference --      Peak Flow --      Pain Score 06/07/22 1753 0     Pain Loc --      Pain Edu? --      Excl. in [place]? --     Most recent vital signs: Vitals:   06/07/22 2100 06/07/22 2130  BP: (!) 171/71 134/64  Pulse: 74 69  Resp: (!) 24 14  Temp:    SpO2: 100% 100%     General: Awake, no distress.  CV:  Good peripheral perfusion. RRR, no m/r/g. Resp:  Normal effort. Lungs CTAB. Abd:  No distention. No tenderness. Other:  CNII-XII intact. Strength 5/5 b/l UE and LE. Normal sensation to light touch.    ED Results / Procedures / Treatments   Labs (all labs ordered are listed, but only abnormal results are displayed) Labs Reviewed  CBC WITH DIFFERENTIAL/PLATELET - Abnormal; Notable for the following components:       Result Value   RBC 4.11 (*)    Hemoglobin 12.5 (*)    HCT 38.2 (*)    Abs Immature Granulocytes 0.11 (*)    All other components within normal limits  COMPREHENSIVE METABOLIC PANEL - Abnormal; Notable for the following components:   Glucose, Bld 122 (*)    BUN 37 (*)    Creatinine, Ser 2.01 (*)    Calcium 8.6 (*)    GFR, Estimated 34 (*)    All other components within normal limits  URINALYSIS, ROUTINE W REFLEX MICROSCOPIC - Abnormal; Notable for the following components:   Color, Urine YELLOW (*)    APPearance CLEAR (*)    All other components within normal limits  TROPONIN I (HIGH SENSITIVITY)  TROPONIN I (HIGH SENSITIVITY)     EKG Normal sinus rhythm, VR 70. QRS 205, QTc 513. No acute ST elevaitons. Junctional rhythm.   RADIOLOGY CT Chest: Parenchymal scarring no infiltrate CT Head: NAICA CXR: Atelectasis vs scarring   I also independently reviewed  and agree with radiologist interpretations.   PROCEDURES:  Critical Care performed: No  .1-3 Lead EKG Interpretation  Performed by: Duffy Bruce, MD Authorized by: Duffy Bruce, MD     Interpretation: normal     ECG rate:  60-80   ECG rate assessment: normal     Rhythm: sinus rhythm     Ectopy: none     Conduction: normal   Comments:     Indication: syncope     MEDICATIONS ORDERED IN ED: Medications  0.9 %  sodium chloride infusion (has no administration in time range)  ipratropium-albuterol (DUONEB) 0.5-2.5 (3) MG/3ML nebulizer solution 3 mL (3 mLs Nebulization Given 06/07/22 2239)  sodium chloride 0.9 % bolus 1,000 mL (1,000 mLs Intravenous New Bag/Given 06/07/22 2239)     IMPRESSION / MDM / ASSESSMENT AND PLAN / ED COURSE  I reviewed the triage vital signs and the nursing notes.                               The patient is on the cardiac monitor to evaluate for evidence of arrhythmia and/or significant heart rate changes.   Ddx:  Differential includes the following, with pertinent life- or  limb-threatening emergencies considered:  Generalized weakness from dehydration, orthostasis, anemia, occult infection (UTI, PNA), ACS, CVA  Patient's presentation is most consistent with acute presentation with potential threat to life or bodily function.  MDM:  75 yo M with PMHx CKD, COPD, here with generalized weakness, falls. Suspect dehydration/deconditioning with orthostasis. Lab work shows acute on chronic kidney injury and pt dehydrated on exam. IVF given. CBC without leukocytosis. Trop negative, EKG is nonischemic. UA negative for UTI. CXR shows ? Scarring so CT Chest obtained and is unremarkable, no PNA. CT Head without signs of stroke.   Pt remains weak/almost fell after fluids. Will admit for hydration, possible PT. Duonebs given for mild wheezing, query possible mild COPD exacerbation though he is satting well on RA. He has a chronic cough which is not particularly worse.   MEDICATIONS GIVEN IN ED: Medications  0.9 %  sodium chloride infusion (has no administration in time range)  ipratropium-albuterol (DUONEB) 0.5-2.5 (3) MG/3ML nebulizer solution 3 mL (3 mLs Nebulization Given 06/07/22 2239)  sodium chloride 0.9 % bolus 1,000 mL (1,000 mLs Intravenous New Bag/Given 06/07/22 2239)     Consults:  Hospitalist consulted for admission   EMR reviewed       FINAL CLINICAL IMPRESSION(S) / ED DIAGNOSES   Final diagnoses:  Dehydration  Weakness  Fall, initial encounter     Rx / DC Orders   ED Discharge Orders     None        Note:  This document was prepared using Dragon voice recognition software and may include unintentional dictation errors.   Duffy Bruce, MD 06/08/22 [phone]

## 2022-06-08 ENCOUNTER — Encounter: Payer: Self-pay | Admitting: Internal Medicine

## 2022-06-08 ENCOUNTER — Other Ambulatory Visit: Payer: Self-pay

## 2022-06-08 ENCOUNTER — Emergency Department: Payer: 59

## 2022-06-08 DIAGNOSIS — N400 Enlarged prostate without lower urinary tract symptoms: Secondary | ICD-10-CM

## 2022-06-08 DIAGNOSIS — E860 Dehydration: Secondary | ICD-10-CM

## 2022-06-08 DIAGNOSIS — G4700 Insomnia, unspecified: Secondary | ICD-10-CM | POA: Diagnosis present

## 2022-06-08 DIAGNOSIS — G43909 Migraine, unspecified, not intractable, without status migrainosus: Secondary | ICD-10-CM | POA: Diagnosis present

## 2022-06-08 DIAGNOSIS — R0789 Other chest pain: Secondary | ICD-10-CM | POA: Diagnosis not present

## 2022-06-08 DIAGNOSIS — M109 Gout, unspecified: Secondary | ICD-10-CM | POA: Diagnosis present

## 2022-06-08 DIAGNOSIS — Z85828 Personal history of other malignant neoplasm of skin: Secondary | ICD-10-CM | POA: Diagnosis not present

## 2022-06-08 DIAGNOSIS — N179 Acute kidney failure, unspecified: Secondary | ICD-10-CM

## 2022-06-08 DIAGNOSIS — R001 Bradycardia, unspecified: Secondary | ICD-10-CM | POA: Diagnosis present

## 2022-06-08 DIAGNOSIS — N1831 Chronic kidney disease, stage 3a: Secondary | ICD-10-CM

## 2022-06-08 DIAGNOSIS — E039 Hypothyroidism, unspecified: Secondary | ICD-10-CM | POA: Diagnosis present

## 2022-06-08 DIAGNOSIS — I951 Orthostatic hypotension: Secondary | ICD-10-CM | POA: Diagnosis present

## 2022-06-08 DIAGNOSIS — R079 Chest pain, unspecified: Secondary | ICD-10-CM | POA: Diagnosis present

## 2022-06-08 DIAGNOSIS — Z7982 Long term (current) use of aspirin: Secondary | ICD-10-CM | POA: Diagnosis not present

## 2022-06-08 DIAGNOSIS — G4733 Obstructive sleep apnea (adult) (pediatric): Secondary | ICD-10-CM

## 2022-06-08 DIAGNOSIS — N1832 Chronic kidney disease, stage 3b: Secondary | ICD-10-CM | POA: Diagnosis present

## 2022-06-08 DIAGNOSIS — E669 Obesity, unspecified: Secondary | ICD-10-CM | POA: Diagnosis present

## 2022-06-08 DIAGNOSIS — G629 Polyneuropathy, unspecified: Secondary | ICD-10-CM | POA: Diagnosis present

## 2022-06-08 DIAGNOSIS — R531 Weakness: Secondary | ICD-10-CM

## 2022-06-08 DIAGNOSIS — R109 Unspecified abdominal pain: Secondary | ICD-10-CM | POA: Diagnosis present

## 2022-06-08 DIAGNOSIS — Z6832 Body mass index (BMI) 32.0-32.9, adult: Secondary | ICD-10-CM | POA: Diagnosis not present

## 2022-06-08 DIAGNOSIS — J449 Chronic obstructive pulmonary disease, unspecified: Secondary | ICD-10-CM | POA: Diagnosis present

## 2022-06-08 DIAGNOSIS — I129 Hypertensive chronic kidney disease with stage 1 through stage 4 chronic kidney disease, or unspecified chronic kidney disease: Secondary | ICD-10-CM | POA: Diagnosis present

## 2022-06-08 DIAGNOSIS — E2740 Unspecified adrenocortical insufficiency: Secondary | ICD-10-CM | POA: Diagnosis present

## 2022-06-08 DIAGNOSIS — K219 Gastro-esophageal reflux disease without esophagitis: Secondary | ICD-10-CM | POA: Diagnosis present

## 2022-06-08 DIAGNOSIS — E7800 Pure hypercholesterolemia, unspecified: Secondary | ICD-10-CM | POA: Diagnosis present

## 2022-06-08 DIAGNOSIS — F250 Schizoaffective disorder, bipolar type: Secondary | ICD-10-CM | POA: Diagnosis present

## 2022-06-08 DIAGNOSIS — F419 Anxiety disorder, unspecified: Secondary | ICD-10-CM | POA: Diagnosis present

## 2022-06-08 LAB — COMPREHENSIVE METABOLIC PANEL
ALT: 18 U/L (ref 0–44)
AST: 18 U/L (ref 15–41)
Albumin: 3.5 g/dL (ref 3.5–5.0)
Alkaline Phosphatase: 53 U/L (ref 38–126)
Anion gap: 6 (ref 5–15)
BUN: 32 mg/dL — ABNORMAL HIGH (ref 8–23)
CO2: 27 mmol/L (ref 22–32)
Calcium: 8.9 mg/dL (ref 8.9–10.3)
Chloride: 106 mmol/L (ref 98–111)
Creatinine, Ser: 1.72 mg/dL — ABNORMAL HIGH (ref 0.61–1.24)
GFR, Estimated: 41 mL/min — ABNORMAL LOW (ref >60)
Glucose, Bld: 123 mg/dL — ABNORMAL HIGH (ref 70–99)
Potassium: 5 mmol/L (ref 3.5–5.1)
Sodium: 139 mmol/L (ref 135–145)
Total Bilirubin: 0.4 mg/dL (ref 0.3–1.2)
Total Protein: 6.3 g/dL — ABNORMAL LOW (ref 6.5–8.1)

## 2022-06-08 LAB — CBC
HCT: 38.9 % — ABNORMAL LOW (ref 39.0–52.0)
Hemoglobin: 12.7 g/dL — ABNORMAL LOW (ref 13.0–17.0)
MCH: 30 pg (ref 26.0–34.0)
MCHC: 32.6 g/dL (ref 30.0–36.0)
MCV: 92 fL (ref 80.0–100.0)
Platelets: 194 10*3/uL (ref 150–400)
RBC: 4.23 MIL/uL (ref 4.22–5.81)
RDW: 13.4 % (ref 11.5–15.5)
WBC: 7.5 10*3/uL (ref 4.0–10.5)
nRBC: 0 % (ref 0.0–0.2)

## 2022-06-08 MED ORDER — TAMSULOSIN HCL 0.4 MG PO CAPS
0.4000 mg | ORAL_CAPSULE | Freq: Every day | ORAL | Status: DC
  Administered 2022-06-08 – 2022-06-11 (×5): 0.4 mg via ORAL
  Filled 2022-06-08 (×5): qty 1

## 2022-06-08 MED ORDER — LEVOTHYROXINE SODIUM 50 MCG PO TABS
50.0000 ug | ORAL_TABLET | Freq: Every day | ORAL | Status: DC
  Administered 2022-06-08 – 2022-06-12 (×5): 50 ug via ORAL
  Filled 2022-06-08 (×5): qty 1

## 2022-06-08 MED ORDER — SODIUM CHLORIDE 0.9 % IV SOLN: INTRAVENOUS | Status: AC

## 2022-06-08 MED ORDER — ATORVASTATIN CALCIUM 10 MG PO TABS
10.0000 mg | ORAL_TABLET | Freq: Every evening | ORAL | Status: DC
  Administered 2022-06-08 – 2022-06-11 (×5): 10 mg via ORAL
  Filled 2022-06-08 (×5): qty 1

## 2022-06-08 MED ORDER — HYDRALAZINE HCL 20 MG/ML IJ SOLN: 10.0000 mg | INTRAMUSCULAR | Status: DC | PRN

## 2022-06-08 MED ORDER — ACETAMINOPHEN 650 MG RE SUPP: 650.0000 mg | Freq: Four times a day (QID) | RECTAL | Status: DC | PRN

## 2022-06-08 MED ORDER — ASPIRIN 81 MG PO TBEC
81.0000 mg | DELAYED_RELEASE_TABLET | Freq: Every day | ORAL | Status: DC
  Administered 2022-06-08 – 2022-06-11 (×5): 81 mg via ORAL
  Filled 2022-06-08 (×5): qty 1

## 2022-06-08 MED ORDER — ALBUTEROL SULFATE (2.5 MG/3ML) 0.083% IN NEBU: 2.5000 mg | INHALATION_SOLUTION | Freq: Three times a day (TID) | RESPIRATORY_TRACT | Status: DC | PRN

## 2022-06-08 MED ORDER — NITROGLYCERIN 0.4 MG SL SUBL: 0.4000 mg | SUBLINGUAL_TABLET | SUBLINGUAL | Status: DC | PRN

## 2022-06-08 MED ORDER — MONTELUKAST SODIUM 10 MG PO TABS
5.0000 mg | ORAL_TABLET | Freq: Every day | ORAL | Status: DC
  Administered 2022-06-08 – 2022-06-11 (×5): 5 mg via ORAL
  Filled 2022-06-08 (×5): qty 1

## 2022-06-08 MED ORDER — FINASTERIDE 5 MG PO TABS
5.0000 mg | ORAL_TABLET | Freq: Every day | ORAL | Status: DC
  Administered 2022-06-08 – 2022-06-12 (×5): 5 mg via ORAL
  Filled 2022-06-08 (×5): qty 1

## 2022-06-08 MED ORDER — ACETAMINOPHEN 325 MG PO TABS: 650.0000 mg | ORAL_TABLET | Freq: Four times a day (QID) | ORAL | Status: DC | PRN

## 2022-06-08 MED ORDER — MORPHINE SULFATE (PF) 2 MG/ML IV SOLN: 1.0000 mg | INTRAVENOUS | Status: DC | PRN

## 2022-06-08 MED ORDER — MIRABEGRON ER 50 MG PO TB24
50.0000 mg | ORAL_TABLET | Freq: Every day | ORAL | Status: DC
  Administered 2022-06-08 – 2022-06-12 (×5): 50 mg via ORAL
  Filled 2022-06-08 (×5): qty 1

## 2022-06-08 MED ORDER — PANTOPRAZOLE SODIUM 40 MG PO TBEC
40.0000 mg | DELAYED_RELEASE_TABLET | Freq: Every day | ORAL | Status: DC
  Administered 2022-06-08 – 2022-06-12 (×5): 40 mg via ORAL
  Filled 2022-06-08 (×5): qty 1

## 2022-06-08 MED ORDER — VALPROIC ACID 250 MG PO CAPS
250.0000 mg | ORAL_CAPSULE | Freq: Two times a day (BID) | ORAL | Status: DC
  Administered 2022-06-08 – 2022-06-12 (×9): 250 mg via ORAL
  Filled 2022-06-08 (×11): qty 1

## 2022-06-08 MED ORDER — FENOFIBRATE 54 MG PO TABS
54.0000 mg | ORAL_TABLET | Freq: Every day | ORAL | Status: DC
  Administered 2022-06-08 – 2022-06-12 (×5): 54 mg via ORAL
  Filled 2022-06-08 (×5): qty 1

## 2022-06-08 MED ORDER — DICLOFENAC SODIUM 1 % EX GEL
2.0000 g | Freq: Four times a day (QID) | CUTANEOUS | Status: DC
  Administered 2022-06-08 – 2022-06-11 (×14): 2 g via TOPICAL
  Filled 2022-06-08: qty 100

## 2022-06-08 MED ORDER — SODIUM CHLORIDE 0.9% FLUSH
3.0000 mL | Freq: Two times a day (BID) | INTRAVENOUS | Status: DC
  Administered 2022-06-08 – 2022-06-12 (×10): 3 mL via INTRAVENOUS

## 2022-06-08 MED ORDER — NORTRIPTYLINE HCL 25 MG PO CAPS
150.0000 mg | ORAL_CAPSULE | Freq: Every day | ORAL | Status: DC
  Administered 2022-06-08 – 2022-06-12 (×5): 150 mg via ORAL
  Filled 2022-06-08 (×5): qty 6

## 2022-06-08 MED ORDER — HEPARIN SODIUM (PORCINE) 5000 UNIT/ML IJ SOLN
5000.0000 [IU] | Freq: Two times a day (BID) | INTRAMUSCULAR | Status: DC
  Administered 2022-06-08 – 2022-06-12 (×10): 5000 [IU] via SUBCUTANEOUS
  Filled 2022-06-08 (×10): qty 1

## 2022-06-08 MED ORDER — SODIUM CHLORIDE 0.9 % IV BOLUS
500.0000 mL | Freq: Once | INTRAVENOUS | Status: AC
  Administered 2022-06-08: 500 mL via INTRAVENOUS

## 2022-06-08 NOTE — Assessment & Plan Note (Signed)
CT abd noncontrast for ? Stones or hydronephrosis.

## 2022-06-08 NOTE — Assessment & Plan Note (Signed)
Cont proscar and flomax and follow. BP for fluctuations.  Ct abd noncontrast for AKI ? Hydronephrosis.

## 2022-06-08 NOTE — Hospital Course (Signed)
Went for shower few days ago and got weak. Pain around his back both sides - rested.

## 2022-06-08 NOTE — Progress Notes (Signed)
Pt states he does not wear CPAP at home and does not wish to wear one tonight.

## 2022-06-08 NOTE — Assessment & Plan Note (Signed)
Pt has had chest pain episodes and TNI negative so far.  We will consult cardiology, he is seeing [facility]. Am message sent to dr Clayborn Bigness.  PRN ntg and morphine. Am lipid and TFT.

## 2022-06-08 NOTE — Consult Note (Signed)
CARDIOLOGY CONSULT NOTE               Patient ID: Dakota ORAN MRN: [id number] DOB/AGE: 08-Dec-1945 76 y.o.  Admit date: 06/07/2022 Referring Physician Florina Ou hospitalist Primary Physician [name] nurse practitioner Primary Cardiologist  Reason for Consultation chest pain vertigo generalized weakness  HPI: Patient presents with complaint of generalized weakness over the past 2 days generalized fatigue chest pain patient also complaining of some vertigo symptoms did not have any headaches back as well as syncope no recent falls he has had some left-sided precordial chest discomfort earlier in the day which resolved.  Patient reportedly has cardiology with [facility] has not been seen particularly recently but with episode of fatigue vertigo dizziness he was referred to the emergency room for further evaluation and cardiology consultation was then requested  Review of systems complete and found to be negative unless listed above     Past Medical History:  Diagnosis Date   Anxiety    Arthritis    Asthma    Basal cell carcinoma 07/04/2021   L nasal ala. 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   R scapula, mod atypia   Ear pain    GERD (gastroesophageal reflux disease)    Gout    Headache    History of kidney stones    Hypercholesteremia    Hypertension    Hypothyroidism    Neuropathy    Pneumonia    Sleep disorder     Past Surgical History:  Procedure Laterality Date   ADJACENT TISSUE TRANSFER/TISSUE REARRANGEMENT Left 12/19/2021   Procedure: ADJACENT TISSUE TRANSFER/TISSUE REARRANGEMENT;  Surgeon: Cindra Presume, MD;  Location: [place];  Service: Plastics;  Laterality: Left;   CHOLECYSTECTOMY     COLONOSCOPY     COLONOSCOPY WITH PROPOFOL N/A 08/01/2021   Procedure: COLONOSCOPY WITH  PROPOFOL;  Surgeon: Jonathon Bellows, MD;  Location: [facility] ENDOSCOPY;  Service: Gastroenterology;  Laterality: N/A;   ESOPHAGOGASTRODUODENOSCOPY (EGD) WITH PROPOFOL N/A 11/30/2021   Procedure: ESOPHAGOGASTRODUODENOSCOPY (EGD) WITH PROPOFOL;  Surgeon: Jonathon Bellows, MD;  Location: [facility] ENDOSCOPY;  Service: Gastroenterology;  Laterality: N/A;   HERNIA REPAIR     NASAL RECONSTRUCTION Left 12/19/2021   Procedure: NASAL RECONSTRUCTION;  Surgeon: Cindra Presume, MD;  Location: [place];  Service: Plastics;  Laterality: Left;  2 hours   pituitary tumor excision      Facility-Administered Medications Prior to Admission  Medication Dose Route Frequency Provider Last Rate Last Admin   betamethasone acetate-betamethasone sodium phosphate (CELESTONE) injection 3 mg  3 mg Intra-articular Once Edrick Kins, DPM       Medications Prior to Admission  Medication Sig Dispense Refill Last Dose   aspirin EC 81 MG tablet Take 81 mg by mouth at bedtime. (2100)   06/06/2022 at 2100   atorvastatin (LIPITOR) 10 MG tablet Take 10 mg by mouth every evening. (2000)   06/06/2022 at 2000   carbamide peroxide (EAR WAX REMOVAL DROPS) 6.5 % OTIC solution Place 5 drops into both ears every Sunday.   Past Week at 0800   fenofibrate 54 MG tablet Take 1 tablet (54 mg total) by mouth daily.   06/07/2022 at 0800   finasteride (PROSCAR) 5 MG tablet Take 1 tablet (5 mg total) by mouth daily. 90 tablet  3 06/07/2022 at 0800   fluticasone furoate-vilanterol (BREO ELLIPTA) 200-25 MCG/INH AEPB Inhale 1 puff into the lungs daily. (0800)   06/07/2022 at 0800   gabapentin (NEURONTIN) 400 MG capsule Take 400 mg by mouth at bedtime. (2000)   06/06/2022 at 2000   ketoconazole (NIZORAL) 2 % cream Apply 1 Application topically See admin instructions. Apply 1 - 2 times daily to scalp 60 g 3 06/06/2022 at 2000   levothyroxine (SYNTHROID, LEVOTHROID) 50 MCG tablet Take 50 mcg by mouth daily before breakfast. (0630)   06/07/2022 at 0630   losartan (COZAAR) 25 MG tablet Take  25 mg by mouth daily. (0800)   06/07/2022 at 0800   meloxicam (MOBIC) 15 MG tablet Take 1 tablet (15 mg total) by mouth daily. 30 tablet 1 06/07/2022 at 0800   mirabegron ER (MYRBETRIQ) 50 MG TB24 tablet Take 50 mg by mouth daily. (0900)   06/07/2022 at 0900   montelukast (SINGULAIR) 5 MG chewable tablet Chew 5 mg by mouth at bedtime. (2100)   06/06/2022 at 2100   nortriptyline (PAMELOR) 75 MG capsule Take 150 mg by mouth daily. (0900)   06/07/2022 at 0900   omeprazole (PRILOSEC) 40 MG capsule Take 1 capsule (40 mg total) by mouth daily. 90 capsule 0 06/07/2022 at 0600   paliperidone (INVEGA) 3 MG 24 hr tablet Take 3 mg by mouth at bedtime.   06/06/2022 at [place], Misc. (EUCERIN) cream Apply 1 application topically in the morning and at bedtime. (0800 & 2000)   06/07/2022 at 0800   tamsulosin (FLOMAX) 0.4 MG CAPS capsule Take 0.4 mg by mouth at bedtime. (2100)   06/06/2022 at 2100   tiZANidine (ZANAFLEX) 2 MG tablet Take 2 mg by mouth at bedtime. (2000)   06/06/2022 at 2000   valproic acid (DEPAKENE) 250 MG capsule Take 250 mg by mouth 2 (two) times daily.   06/07/2022 at 0800   white petrolatum (VASELINE) GEL Apply 1 application topically as directed. Apply vaseline to skin lesions and cover with band-aid until healed   06/07/2022 at 0800   acetaminophen (TYLENOL) 500 MG tablet Take 500 mg by mouth every 4 (four) hours as needed for moderate pain or mild pain.   prn at prn   albuterol (PROVENTIL HFA;VENTOLIN HFA) 108 (90 BASE) MCG/ACT inhaler Inhale 1 puff into the lungs 3 (three) times daily as needed for wheezing or shortness of breath.   prn at prn   alum & mag hydroxide-simeth (MAALOX/MYLANTA) 200-200-20 MG/5ML suspension Take 30 mLs by mouth 4 (four) times daily as needed for indigestion or heartburn.   prn at prn   chlorhexidine (PERIDEX) 0.12 % solution Use as directed 15 mLs in the mouth or throat 2 (two) times daily as needed (oral discomfort).   prn at prn   colchicine 0.6 MG tablet Take 1.'2mg'$  (2  tablets) then 0.'6mg'$  (1 tablet) 1 hour after. Then, take 1 tablet every day for 7 days. (Patient not taking: Reported on 06/07/2022) 10 tablet 2 Not Taking   cycloSPORINE (RESTASIS) 0.05 % ophthalmic emulsion Place 1 drop into both eyes 2 (two) times daily as needed (dry eyes).   prn at prn   diclofenac Sodium (VOLTAREN) 1 % GEL Apply 2 g topically as needed (lower left leg pain.).   prn at prn   diphenhydrAMINE (BENADRYL) 25 MG tablet Take 25 mg by mouth every 6 (six) hours as needed (allergic reaction).   prn at prn   Fluocinolone Acetonide Body 0.01 %  OIL Apply to scalp 1-2 times a day and leave on. 120 mL 3 prn at prn   fluocinonide ointment (LIDEX) 4.09 % Apply 1 application topically 2 (two) times daily as needed (pruritus). (apply to legs)   prn at prn   guaiFENesin (ROBITUSSIN) 100 MG/5ML liquid Take 300 mg by mouth every 6 (six) hours as needed for cough.   prn at prn   ketoconazole (NIZORAL) 2 % shampoo Massage into scalp daily and let sit several minutes before rinsing. (Patient not taking: Reported on 06/07/2022) 120 mL 4 Not Taking   lactulose (CHRONULAC) 10 GM/15ML solution Take 30 mLs (20 g total) by mouth daily as needed for mild constipation. 120 mL 0 prn at prn   loperamide (IMODIUM) 2 MG capsule Take 4 mg by mouth as needed for diarrhea or loose stools.   prn at prn   magnesium hydroxide (MILK OF MAGNESIA) 400 MG/5ML suspension Take 30 mLs by mouth daily as needed (constipation (no bowel movement for 3 days)).   prn at prn   methylPREDNISolone (MEDROL DOSEPAK) 4 MG TBPK tablet Take as directed (Patient not taking: Reported on 06/07/2022) 21 each 0 Not Taking   ondansetron (ZOFRAN-ODT) 4 MG disintegrating tablet Take 1 tablet (4 mg total) by mouth every 8 (eight) hours as needed for nausea or vomiting. 20 tablet 0 prn at prn   oxyCODONE-acetaminophen (PERCOCET) 5-325 MG tablet Take 1 tablet by mouth every 4 (four) hours as needed for severe pain. 30 tablet 0 prn at prn   polyethylene glycol  (MIRALAX / GLYCOLAX) 17 g packet Take 17 g by mouth daily as needed (constipation.). (Patient not taking: Reported on 06/07/2022)   Not Taking   Social History   Socioeconomic History   Marital status: Single    Spouse name: Not on file   Number of children: Not on file   Years of education: Not on file   Highest education level: Not on file  Occupational History   Not on file  Tobacco Use   Smoking status: Former    Types: Cigarettes   Smokeless tobacco: Never  Vaping Use   Vaping Use: Never used  Substance and Sexual Activity   Alcohol use: No   Drug use: No   Sexual activity: Not on file  Other Topics Concern   Not on file  Social History Narrative   Not on file   Social Determinants of Health   Financial Resource Strain: Not on file  Food Insecurity: Not on file  Transportation Needs: Not on file  Physical Activity: Not on file  Stress: Not on file  Social Connections: Not on file  Intimate Partner Violence: Not on file    Family History  Problem Relation Age of Onset   Prostate cancer Neg Hx    Bladder Cancer Neg Hx    Kidney cancer Neg Hx       Review of systems complete and found to be negative unless listed above      PHYSICAL EXAM  General: Well developed, well nourished, in no acute distress HEENT:  Normocephalic and atramatic Neck:  No JVD.  Lungs: Clear bilaterally to auscultation and percussion. Heart: HRRR . Normal S1 and S2 without gallops or murmurs.  Abdomen: Bowel sounds are positive, abdomen soft and non-tender  Msk:  Back normal, normal gait. Normal strength and tone for age. Extremities: No clubbing, cyanosis or edema.   Neuro: Alert and oriented X 3. Psych:  Good affect, responds appropriately  Labs:  Lab Results  Component Value Date   WBC 7.5 06/08/2022   HGB 12.7 (L) 06/08/2022   HCT 38.9 (L) 06/08/2022   MCV 92.0 06/08/2022   PLT 194 06/08/2022    Recent Labs  Lab 06/08/22 0249  NA 139  K 5.0  CL 106  CO2 27  BUN  32*  CREATININE 1.72*  CALCIUM 8.9  PROT 6.3*  BILITOT 0.4  ALKPHOS 53  ALT 18  AST 18  GLUCOSE 123*   No results found for: "CKTOTAL", "CKMB", "CKMBINDEX", "TROPONINI" No results found for: "CHOL" No results found for: "HDL" No results found for: "LDLCALC" No results found for: "TRIG" No results found for: "CHOLHDL" No results found for: "LDLDIRECT"    Radiology: CT ABDOMEN PELVIS WO CONTRAST  Result Date: 06/08/2022 CLINICAL DATA:  Nausea and vomiting.  Low back pain. EXAM: CT ABDOMEN AND PELVIS WITHOUT CONTRAST TECHNIQUE: Multidetector CT imaging of the abdomen and pelvis was performed following the standard protocol without IV contrast. RADIATION DOSE REDUCTION: This exam was performed according to the departmental dose-optimization program which includes automated exposure control, adjustment of the mA and/or kV according to patient size and/or use of iterative reconstruction technique. COMPARISON:  CT 04/02/2016 FINDINGS: Lower chest: Basilar scarring which was characterized on chest CT 3 hours ago. Calcified granuloma in the right lower lobe. Hepatobiliary: No focal hepatic abnormality on this unenhanced exam. Cholecystectomy. No biliary dilatation. Pancreas: Fatty atrophy.  No ductal dilatation or inflammation. Spleen: Normal in size without focal abnormality. Adrenals/Urinary Tract: No adrenal nodule. Renal parenchymal atrophy with symmetric perinephric edema. No hydronephrosis. No renal calculi. Moderate urinary bladder distention without wall thickening or inflammation. Stomach/Bowel: The stomach is distended with fluid. No abnormal gastric wall thickening. No small bowel obstruction or inflammation. Diminutive appendix is tentatively visualized. Focal calcification at the distal appendix is chronic and may represent an appendicolith or dystrophic calcification. Regardless no appendicitis. Moderate volume of stool throughout the colon. No colonic wall thickening or inflammatory change.  Vascular/Lymphatic: Moderate aortic atherosclerosis. No aneurysm. No bulky abdominopelvic adenopathy. Reproductive: Prostate gland is enlarged causing mass effect on the bladder base. Prostate spans 5.8 cm AP. Other: No free air, free fluid, or intra-abdominal fluid collection. Minimal fat in the inguinal canals. Musculoskeletal: Prominent degenerative disc disease throughout the lumbar spine. There are no acute or suspicious osseous abnormalities. IMPRESSION: 1. Fluid distended stomach, may represent recent p.o. intake. The possibility of gastroparesis is also considered. 2. Enlarged prostate gland causing mass effect on the bladder base. Moderate urinary bladder distention without wall thickening or inflammation. 3. Moderate colonic stool burden, can be seen with constipation. Aortic Atherosclerosis (ICD10-I70.0). Electronically Signed   By: Keith Rake M.D.   On: 06/08/2022 01:43   CT Chest Wo Contrast  Result Date: 06/07/2022 CLINICAL DATA:  Respiratory difficulty EXAM: CT CHEST WITHOUT CONTRAST TECHNIQUE: Multidetector CT imaging of the chest was performed following the standard protocol without IV contrast. RADIATION DOSE REDUCTION: This exam was performed according to the departmental dose-optimization program which includes automated exposure control, adjustment of the mA and/or kV according to patient size and/or use of iterative reconstruction technique. COMPARISON:  Chest x-ray from earlier in the same day. FINDINGS: Cardiovascular: Limited due to lack of IV contrast. Aberrant right subclavian artery is noted. Atherosclerotic calcifications of the aorta and its branches are seen. No aneurysmal dilatation is noted. No cardiac enlargement is seen. Scattered coronary calcifications are noted. Mediastinum/Nodes: Thoracic inlet is within normal limits. No hilar or mediastinal adenopathy is noted. The  esophagus is within normal limits. Lungs/Pleura: Lungs are well aerated bilaterally. Mild dependent  scarring is noted bilaterally. No focal confluent infiltrate or sizable parenchymal nodules are seen. No effusion is noted. Upper Abdomen: Visualized upper abdomen shows no acute abnormality. Musculoskeletal: Degenerative changes of the thoracic spine are seen. No acute rib abnormality is noted. IMPRESSION: Bilateral parenchymal scarring without acute infiltrate. Aberrant right subclavian artery. Aortic Atherosclerosis (ICD10-I70.0). Electronically Signed   By: Inez Catalina M.D.   On: 06/07/2022 22:09   CT HEAD WO CONTRAST (5MM)  Result Date: 06/07/2022 CLINICAL DATA:  Fall yesterday with increased weakness. History of stroke. EXAM: CT HEAD WITHOUT CONTRAST TECHNIQUE: Contiguous axial images were obtained from the base of the skull through the vertex without intravenous contrast. RADIATION DOSE REDUCTION: This exam was performed according to the departmental dose-optimization program which includes automated exposure control, adjustment of the mA and/or kV according to patient size and/or use of iterative reconstruction technique. COMPARISON:  None Available. FINDINGS: Brain: There is no acute intracranial hemorrhage, extra-axial fluid collection, or acute infarct Parenchymal volume is normal. The ventricles are normal in size. Gray-white differentiation is preserved. There is no mass lesion.  There is no mass effect or midline shift. Vascular: There is calcification of the bilateral cavernous ICAs. Skull: Normal. Negative for fracture or focal lesion. Sinuses/Orbits: Postsurgical changes are noted in the sinonasal cavity with mild mucosal thickening in the postsurgical sphenoid sinus. The globes and orbits are unremarkable. Other: None. IMPRESSION: No acute intracranial pathology. Electronically Signed   By: Valetta Mole M.D.   On: 06/07/2022 18:31   DG Chest 2 View  Result Date: 06/07/2022 CLINICAL DATA:  Chest pain, weakness, and dehydration. EXAM: CHEST - 2 VIEW COMPARISON:  10/27/2020 FINDINGS: Shallow  inspiration. Linear atelectasis or scarring in the lung bases. Heart size and pulmonary vascularity are normal. Previous infiltrates have improved. No developing consolidation or edema. No pleural effusions. No pneumothorax. Mediastinal contours appear intact. Calcification of the aorta. Degenerative changes in the spine and shoulders. IMPRESSION: Shallow inspiration with linear fibrosis or atelectasis in the lung bases. No focal consolidation or edema. Electronically Signed   By: Lucienne Capers M.D.   On: 06/07/2022 18:28    EKG: Tachycardia rate bundle-branch block left anterior fascicular block nonspecific ST-T wave changes rate of around 100  ASSESSMENT AND PLAN:  Vertigo Chest pain Hypertension Acute on chronic renal insufficiency stage III Bilateral flank Obstructive sleep apnea  . Plan Recommend adequate hydration correct electrolytes check for orthostatic hypotension Use hydralazine as needed for blood pressure currently losartan at home will evaluate for dehydration and orthostatic hypotension follow-up renal insufficiency Maintain adequate hydration for renal insufficiency consider evaluation by nephrology Sleep study CPAP if indicated Chest pain appears to be atypical recommend conservative therapy consider functional study as an outpatient   Signed: Yolonda Kida MD 06/08/2022, 2:09 PM

## 2022-06-08 NOTE — Plan of Care (Signed)

## 2022-06-08 NOTE — Assessment & Plan Note (Signed)
Due to LOW bp and orthostatic BP due to meds and dehydration.  Fall precaution. Orthostatic vitals q shift x 2.

## 2022-06-08 NOTE — Assessment & Plan Note (Signed)
Due to dehydration meds and decreased PO intake.  We will hold home bp regimen and reintroduce at lower dose.  Vitals:   06/07/22 1736 06/07/22 1848 06/07/22 1930 06/07/22 2000  BP: 122/70 (!) 151/67 (!) 144/66 (!) 163/76   06/07/22 2030 06/07/22 2100 06/07/22 2130 06/07/22 2330  BP: (!) 151/75 (!) 171/71 134/64 (!) 161/75   06/08/22 0000 06/08/22 0100  BP: 133/72 136/65

## 2022-06-08 NOTE — Progress Notes (Signed)
[facility] Progress Note  Patient: Dakota Harper     DZH:[id number]  DOA: 06/07/2022   PCP: [name], NP       Brief hospital course: This is a 76 year old male who lives in an independent living facility and has severe insomnia, anxiety, hypertension, COPD, hypothyroidism, GERD, BPH. He presents from his facility for generalized weakness and finding of low BP. Patient states he has been sleeping very poorly.  He states he is taking numerous medications for insomnia over the years including Ambien, temazepam, Seroquel, trazodone and neither of these nor the ones he is currently receiving have been effective. In the ED, he was found to be orthostatic, given fluid boluses and continuous fluids. Initial EKG showed junctional bradycardia.  He admits to having "twinges in the left side of his chest and was referred to a cardiologist that [facility] clinic who he has not yet seen.  Subjective:  No new complaints today.  Has not ambulated to see if he is dizzy or weak.  Did not sleep well last night.  Assessment and Plan: Principal Problem:   Orthostatic hypotension   Acute renal failure superimposed on stage 3a chronic kidney disease (HCC) -Hold losartan and Mobic -Given fluid bolus and IV fluids-he remains orthostatic today - I have ordered a 500 cc normal saline bolus and asked for orthostatics to be rechecked - I have discussed starting TED hose but he declined stating that they have been much too tight for him in the past and I suspect that he will not wear them even if I have ordered for him - Check a.m. cortisol tomorrow stroke  Active Problems: Bradycardia - Requested a cardiology eval- - The patient is not on any beta-blockers    Chest pain -Tenderness present in left upper chest wall - Voltaren gel and K-pad ordered - High-sensitivity troponin 3 and 3    Generalized weakness -She attributes this to inability to sleep - Follow - PT consulted  Insomnia -  Continue gabapentin, which she states was recently increased but has not helped - I will increase it to 600 mg for tonight  Migraine headaches - He states his neurologist Dr. Starleen Blue prescribed nortriptyline for him    Hypertension, benign -Holding losartan-follow BP    BPH (benign prostatic hyperplasia) -Continue Proscar and Flomax     Hypothyroidism - Continue Synthroid    OSA (obstructive sleep apnea) -Continue CPAP  Gout - Takes as needed colchicine which  Psychological problems - Patient is noted to be on Invega and Depakene because she states he has been told that he has schizoaffective disorder and bipolar disorder  Patient also takes tizanidine and Myrbetriq at bedtime which I have resumed   DVT prophylaxis:  heparin injection 5,000 Units Start: 06/08/22 0130    Code Status: Full Code  Consultants: None Level of Care: Level of care: Telemetry Medical Disposition Plan:  Status is: Observation The patient will require care spanning > 2 midnights and should be moved to inpatient because: Ongoing orthostatic hypotension and need for IV fluids and further treatment  Objective:   Vitals:   06/08/22 0200 06/08/22 0221 06/08/22 0306 06/08/22 0746  BP: (!) 150/72 (!) 159/81  (!) 145/78  Pulse: 79 85  90  Resp: (!) '23 16  16  '$ Temp: (!) 97.4 F (36.3 C) 97.6 F (36.4 C)  97.9 F (36.6 C)  TempSrc: Oral     SpO2: 100% 100%  98%  Weight:   97.3 kg   Height:  $'5\' 8"'B$  (1.727 m)    Filed Weights   06/08/22 0306  Weight: 97.3 kg   Exam: General exam: Appears comfortable  HEENT: PERRLA, oral mucosa moist, no sclera icterus or thrush Respiratory system: Clear to auscultation. Respiratory effort normal. Cardiovascular system: S1 & S2 heard, regular rate and rhythm Gastrointestinal system: Abdomen soft, non-tender, nondistended. Normal bowel sounds   Central nervous system: Alert and oriented. No focal neurological deficits. Extremities: No cyanosis, clubbing or  edema Skin: No rashes or ulcers Psychiatry:  Mood & affect appropriate.    Imaging and lab data was personally reviewed    CBC: Recent Labs  Lab 06/07/22 1850 06/08/22 0249  WBC 6.7 7.5  NEUTROABS 4.5  --   HGB 12.5* 12.7*  HCT 38.2* 38.9*  MCV 92.9 92.0  PLT 177 474   Basic Metabolic Panel: Recent Labs  Lab 06/07/22 1850 06/08/22 0249  NA 135 139  K 4.5 5.0  CL 103 106  CO2 27 27  GLUCOSE 122* 123*  BUN 37* 32*  CREATININE 2.01* 1.72*  CALCIUM 8.6* 8.9   GFR: Estimated Creatinine Clearance: 41.3 mL/min (A) (by C-G formula based on SCr of 1.72 mg/dL (H)).  Scheduled Meds:  aspirin EC  81 mg Oral QHS   atorvastatin  10 mg Oral QPM   diclofenac Sodium  2 g Topical QID   fenofibrate  54 mg Oral Daily   finasteride  5 mg Oral Daily   heparin  5,000 Units Subcutaneous Q12H   levothyroxine  50 mcg Oral Q0600   mirabegron ER  50 mg Oral Daily   montelukast  5 mg Oral QHS   nortriptyline  150 mg Oral Daily   pantoprazole  40 mg Oral Daily   sodium chloride flush  3 mL Intravenous Q12H   tamsulosin  0.4 mg Oral QHS   valproic acid  250 mg Oral BID   Continuous Infusions:  sodium chloride 50 mL/hr at 06/08/22 0536   sodium chloride       LOS: 0 days   Author: Eunice Blase Shondale Quinley  06/08/2022 11:07 AM

## 2022-06-08 NOTE — Assessment & Plan Note (Signed)
tft . Cont levothyroxine at 50 mcg.

## 2022-06-08 NOTE — Assessment & Plan Note (Signed)
PRN hydralazine. Home losartan held due to Orthostatic BP and AKI on CKD.

## 2022-06-08 NOTE — Assessment & Plan Note (Signed)
cpap per home settings.  

## 2022-06-08 NOTE — Assessment & Plan Note (Addendum)
Lab Results  Component Value Date   CREATININE 2.01 (H) 06/07/2022   CREATININE 1.45 (H) 12/19/2021   CREATININE 1.74 (H) 03/19/2021  AKI on CKD stage 3 b. Avoid contrast. Renally dose meds.  Hold losartan.

## 2022-06-08 NOTE — ED Notes (Signed)
Hospitalist at bedside 

## 2022-06-08 NOTE — Assessment & Plan Note (Signed)
Due to multiple factors esp his hypotension and prolong qtc on ekg.  Fall precaution.Follow BP .

## 2022-06-08 NOTE — Evaluation (Signed)
Physical Therapy Evaluation Patient Details Name: Dakota Harper MRN: [id number] DOB: Oct 29, 1946 Today's Date: 06/08/2022  History of Present Illness  Pt is a 76 year old male who lives in an independent living facility and has severe insomnia, anxiety, hypertension, COPD, hypothyroidism, GERD, BPH.  He presents from his facility for generalized weakness and finding of low BP.    Clinical Impression  Pt received in Semi-Fowler's position and agreeable to therapy.  Pt notes he is not sure if he is ready to begin therapy, but did state he needed to go to the restroom.  Therapist assisted with getting him to the bathroom and managing the lines/leads.  Pt performed well with all transfers, needing only CGA/supervision for safety with first attempt.  Pt then progressed to ambulating outside the room around the nursing station.  Pt deferred ambulating anymore, noting that he was somewhat fatigued.  Pt transferred back to bed where all need were met and call bell was within reach.  Current discharge plans to ALF with continued PT remain appropriate at this time.  Pt will continue to benefit from skilled therapy in order to address deficits listed below.          Recommendations for follow up therapy are one component of a multi-disciplinary discharge planning process, led by the attending physician.  Recommendations may be updated based on patient status, additional functional criteria and insurance authorization.  Follow Up Recommendations Home health PT (Continue with PT at ALF.)      Assistance Recommended at Discharge None  Patient can return home with the following  A little help with walking and/or transfers    Equipment Recommendations None recommended by PT (Pt said he had a walker, but notes that he may need a new one.)  Recommendations for Other Services       Functional Status Assessment Patient has had a recent decline in their functional status and demonstrates the ability to make  significant improvements in function in a reasonable and predictable amount of time.     Precautions / Restrictions Precautions Precautions: None Restrictions Weight Bearing Restrictions: No      Mobility  Bed Mobility Overal bed mobility: Modified Independent                  Transfers Overall transfer level: Modified independent                      Ambulation/Gait Ambulation/Gait assistance: Min guard Gait Distance (Feet): 160 Feet Assistive device: Rolling walker (2 wheels) Gait Pattern/deviations: Step-through pattern, Trunk flexed Gait velocity: decreased     General Gait Details: mild trunk flexion when ambulating, overall moves well and requires verbal cuing for RW.  Stairs            Wheelchair Mobility    Modified Rankin (Stroke Patients Only)       Balance Overall balance assessment: Mild deficits observed, not formally tested                                           Pertinent Vitals/Pain Pain Assessment Pain Assessment: No/denies pain    Home Living Family/patient expects to be discharged to:: Assisted living [facility])                 Home Equipment: Conservation officer, nature (2 wheels);Wheelchair - manual      Prior Function Prior Level of  Function : Independent/Modified Independent             Mobility Comments: Pt notes he enjoys walking around [employer] and back       Hand Dominance   Dominant Hand: Right    Extremity/Trunk Assessment   Upper Extremity Assessment Upper Extremity Assessment: Overall WFL for tasks assessed    Lower Extremity Assessment Lower Extremity Assessment: Overall WFL for tasks assessed       Communication   Communication: No difficulties  Cognition Arousal/Alertness: Awake/alert Behavior During Therapy: WFL for tasks assessed/performed Overall Cognitive Status: Within Functional Limits for tasks assessed                                           General Comments      Exercises     Assessment/Plan    PT Assessment Patient needs continued PT services  PT Problem List Decreased strength;Decreased activity tolerance;Decreased mobility;Decreased knowledge of use of DME       PT Treatment Interventions DME instruction;Gait training;Stair training;Functional mobility training;Therapeutic activities;Therapeutic exercise;Balance training;Neuromuscular re-education    PT Goals (Current goals can be found in the Care Plan section)  Acute Rehab PT Goals Patient Stated Goal: to get his heart under control and get stronger. PT Goal Formulation: With patient Time For Goal Achievement: 06/22/22 Potential to Achieve Goals: Good    Frequency Min 2X/week     Co-evaluation               AM-PAC PT "6 Clicks" Mobility  Outcome Measure Help needed turning from your back to your side while in a flat bed without using bedrails?: None Help needed moving from lying on your back to sitting on the side of a flat bed without using bedrails?: None Help needed moving to and from a bed to a chair (including a wheelchair)?: None Help needed standing up from a chair using your arms (e.g., wheelchair or bedside chair)?: None Help needed to walk in hospital room?: A Little Help needed climbing 3-5 steps with a railing? : A Little 6 Click Score: 22    End of Session Equipment Utilized During Treatment: Gait belt Activity Tolerance: Patient tolerated treatment well Patient left: in bed;with call bell/phone within reach;with bed alarm set Nurse Communication: Mobility status PT Visit Diagnosis: Other abnormalities of gait and mobility (R26.89);Muscle weakness (generalized) (M62.81)    Time: 7034-0352 PT Time Calculation (min) (ACUTE ONLY): 39 min   Charges:   PT Evaluation $PT Eval Low Complexity: 1 Low PT Treatments $Gait Training: 8-22 mins $Therapeutic Activity: 8-22 mins        Gwenlyn Saran, PT, DPT 06/08/22, 2:02 PM

## 2022-06-08 NOTE — ED Notes (Signed)
Patient transported to CT 

## 2022-06-09 ENCOUNTER — Inpatient Hospital Stay: Payer: 59

## 2022-06-09 LAB — CORTISOL-AM, BLOOD: Cortisol - AM: 5 ug/dL — ABNORMAL LOW (ref 6.7–22.6)

## 2022-06-09 MED ORDER — KETOCONAZOLE 2 % EX CREA
1.0000 | TOPICAL_CREAM | CUTANEOUS | Status: DC
Start: 2022-06-09 — End: 2022-06-12
  Administered 2022-06-09 – 2022-06-10 (×3): 1 via TOPICAL
  Filled 2022-06-09: qty 15

## 2022-06-09 MED ORDER — REGADENOSON 0.4 MG/5ML IV SOLN
0.4000 mg | Freq: Once | INTRAVENOUS | Status: AC
Start: 2022-06-09 — End: 2022-06-09
  Administered 2022-06-09: 0.4 mg via INTRAVENOUS
  Filled 2022-06-09: qty 5

## 2022-06-09 MED ORDER — COSYNTROPIN 0.25 MG IJ SOLR
0.2500 mg | Freq: Once | INTRAMUSCULAR | Status: AC
  Administered 2022-06-10: 0.25 mg via INTRAVENOUS
  Filled 2022-06-09: qty 0.25

## 2022-06-09 MED ORDER — PALIPERIDONE ER 3 MG PO TB24
3.0000 mg | ORAL_TABLET | Freq: Every day | ORAL | Status: DC
  Administered 2022-06-09 – 2022-06-11 (×3): 3 mg via ORAL
  Filled 2022-06-09 (×3): qty 1

## 2022-06-09 MED ORDER — TECHNETIUM TC 99M TETROFOSMIN IV KIT
10.4500 | PACK | Freq: Once | INTRAVENOUS | Status: AC | PRN
Start: 2022-06-09 — End: 2022-06-09
  Administered 2022-06-09: 10.45 via INTRAVENOUS

## 2022-06-09 MED ORDER — FLUOCINONIDE 0.05 % EX OINT
1.0000 | TOPICAL_OINTMENT | Freq: Two times a day (BID) | CUTANEOUS | Status: DC | PRN
Start: 2022-06-09 — End: 2022-06-12
  Administered 2022-06-09 – 2022-06-10 (×2): 1 via TOPICAL
  Filled 2022-06-09: qty 15

## 2022-06-09 MED ORDER — TECHNETIUM TC 99M TETROFOSMIN IV KIT
30.9300 | PACK | Freq: Once | INTRAVENOUS | Status: AC | PRN
  Administered 2022-06-09: 30.93 via INTRAVENOUS

## 2022-06-09 MED ORDER — GABAPENTIN 300 MG PO CAPS
600.0000 mg | ORAL_CAPSULE | Freq: Every day | ORAL | Status: DC
  Administered 2022-06-09 – 2022-06-11 (×3): 600 mg via ORAL
  Filled 2022-06-09 (×3): qty 2

## 2022-06-09 NOTE — TOC Progression Note (Signed)
Transition of Care [facility]) - Progression Note    Patient Details  Name: Dakota Harper MRN: [id number] Date of Birth: 1946/10/02  Transition of Care [facility]) CM/SW [place], RN Phone Number: 06/09/2022, 12:47 PM  Clinical Narrative:     The patient resides at the [facility], He will return and have [facility] for [facility], I spoke with Meg with [facility] to confirm The patient has a wheelchair and Rolling walker at the ALF [facility] does not provide transportation on the weekends, the patient will need help with transportation,         Expected Discharge Plan and Services                                                 Social Determinants of Health (SDOH) Interventions    Readmission Risk Interventions     No data to display

## 2022-06-09 NOTE — Progress Notes (Signed)
PT Cancellation Note  Patient Details Name: Dakota Harper MRN: [id number] DOB: 1946-05-14   Cancelled Treatment:    Reason Eval/Treat Not Completed: Other (comment).  Pt asleep upon arrival and was able to be easily awakened.  Pt deferred therapy at this time as he notes he is just not feeling great in his back.  Will re-attempt as medically appropriate on alter date/time.  Gwenlyn Saran, PT, DPT 06/09/22, 4:08 PM

## 2022-06-09 NOTE — Progress Notes (Signed)
[facility]    SUBJECTIVE: Patient states to be doing reasonably well no chest pain No blackout spells or syncope no abdominal discomfort still feels slightly weak   Vitals:   06/09/22 0811 06/09/22 1313 06/09/22 1541 06/09/22 1950  BP: (!) 157/73 (!) 159/77 (!) 155/76 (!) 146/74  Pulse: 79 83 85 82  Resp: '18 18 18 16  '$ Temp: 98.1 F (36.7 C) 97.7 F (36.5 C) 97.8 F (36.6 C) 98.3 F (36.8 C)  TempSrc: Oral Oral    SpO2: 99% 99% 100% 99%  Weight:      Height:         Intake/Output Summary (Last 24 hours) at 06/09/2022 2019 Last data filed at 06/09/2022 1842 Gross per 24 hour  Intake 1290 ml  Output 1250 ml  Net 40 ml      PHYSICAL EXAM  General: Well developed, well nourished, in no acute distress HEENT:  Normocephalic and atramatic Neck:  No JVD.  Lungs: Clear bilaterally to auscultation and percussion. Heart: HRRR . Normal S1 and S2 without gallops or murmurs.  Abdomen: Bowel sounds are positive, abdomen soft and non-tender  Msk:  Back normal, normal gait. Normal strength and tone for age. Extremities: No clubbing, cyanosis or edema.   Neuro: Alert and oriented X 3. Psych:  Good affect, responds appropriately   LABS: Basic Metabolic Panel: Recent Labs    06/07/22 1850 06/08/22 0249  NA 135 139  K 4.5 5.0  CL 103 106  CO2 27 27  GLUCOSE 122* 123*  BUN 37* 32*  CREATININE 2.01* 1.72*  CALCIUM 8.6* 8.9   Liver Function Tests: Recent Labs    06/07/22 1850 06/08/22 0249  AST 20 18  ALT 18 18  ALKPHOS 61 53  BILITOT 0.6 0.4  PROT 6.5 6.3*  ALBUMIN 3.6 3.5   No results for input(s): "LIPASE", "AMYLASE" in the last 72 hours. CBC: Recent Labs    06/07/22 1850 06/08/22 0249  WBC 6.7 7.5  NEUTROABS 4.5  --   HGB 12.5* 12.7*  HCT 38.2* 38.9*  MCV 92.9 92.0  PLT 177 194   Cardiac Enzymes: No results for input(s): "CKTOTAL", "CKMB", "CKMBINDEX", "TROPONINI" in the last 72 hours. BNP: Invalid input(s): "POCBNP" D-Dimer: No results for  input(s): "DDIMER" in the last 72 hours. Hemoglobin A1C: No results for input(s): "HGBA1C" in the last 72 hours. Fasting Lipid Panel: No results for input(s): "CHOL", "HDL", "LDLCALC", "TRIG", "CHOLHDL", "LDLDIRECT" in the last 72 hours. Thyroid Function Tests: No results for input(s): "TSH", "T4TOTAL", "T3FREE", "THYROIDAB" in the last 72 hours.  Invalid input(s): "FREET3" Anemia Panel: No results for input(s): "VITAMINB12", "FOLATE", "FERRITIN", "TIBC", "IRON", "RETICCTPCT" in the last 72 hours.  CT ABDOMEN PELVIS WO CONTRAST  Result Date: 06/08/2022 CLINICAL DATA:  Nausea and vomiting.  Low back pain. EXAM: CT ABDOMEN AND PELVIS WITHOUT CONTRAST TECHNIQUE: Multidetector CT imaging of the abdomen and pelvis was performed following the standard protocol without IV contrast. RADIATION DOSE REDUCTION: This exam was performed according to the departmental dose-optimization program which includes automated exposure control, adjustment of the mA and/or kV according to patient size and/or use of iterative reconstruction technique. COMPARISON:  CT 04/02/2016 FINDINGS: Lower chest: Basilar scarring which was characterized on chest CT 3 hours ago. Calcified granuloma in the right lower lobe. Hepatobiliary: No focal hepatic abnormality on this unenhanced exam. Cholecystectomy. No biliary dilatation. Pancreas: Fatty atrophy.  No ductal dilatation or inflammation. Spleen: Normal in size without focal abnormality. Adrenals/Urinary Tract: No adrenal nodule. Renal  parenchymal atrophy with symmetric perinephric edema. No hydronephrosis. No renal calculi. Moderate urinary bladder distention without wall thickening or inflammation. Stomach/Bowel: The stomach is distended with fluid. No abnormal gastric wall thickening. No small bowel obstruction or inflammation. Diminutive appendix is tentatively visualized. Focal calcification at the distal appendix is chronic and may represent an appendicolith or dystrophic  calcification. Regardless no appendicitis. Moderate volume of stool throughout the colon. No colonic wall thickening or inflammatory change. Vascular/Lymphatic: Moderate aortic atherosclerosis. No aneurysm. No bulky abdominopelvic adenopathy. Reproductive: Prostate gland is enlarged causing mass effect on the bladder base. Prostate spans 5.8 cm AP. Other: No free air, free fluid, or intra-abdominal fluid collection. Minimal fat in the inguinal canals. Musculoskeletal: Prominent degenerative disc disease throughout the lumbar spine. There are no acute or suspicious osseous abnormalities. IMPRESSION: 1. Fluid distended stomach, may represent recent p.o. intake. The possibility of gastroparesis is also considered. 2. Enlarged prostate gland causing mass effect on the bladder base. Moderate urinary bladder distention without wall thickening or inflammation. 3. Moderate colonic stool burden, can be seen with constipation. Aortic Atherosclerosis (ICD10-I70.0). Electronically Signed   By: Keith Rake M.D.   On: 06/08/2022 01:43   CT Chest Wo Contrast  Result Date: 06/07/2022 CLINICAL DATA:  Respiratory difficulty EXAM: CT CHEST WITHOUT CONTRAST TECHNIQUE: Multidetector CT imaging of the chest was performed following the standard protocol without IV contrast. RADIATION DOSE REDUCTION: This exam was performed according to the departmental dose-optimization program which includes automated exposure control, adjustment of the mA and/or kV according to patient size and/or use of iterative reconstruction technique. COMPARISON:  Chest x-ray from earlier in the same day. FINDINGS: Cardiovascular: Limited due to lack of IV contrast. Aberrant right subclavian artery is noted. Atherosclerotic calcifications of the aorta and its branches are seen. No aneurysmal dilatation is noted. No cardiac enlargement is seen. Scattered coronary calcifications are noted. Mediastinum/Nodes: Thoracic inlet is within normal limits. No hilar or  mediastinal adenopathy is noted. The esophagus is within normal limits. Lungs/Pleura: Lungs are well aerated bilaterally. Mild dependent scarring is noted bilaterally. No focal confluent infiltrate or sizable parenchymal nodules are seen. No effusion is noted. Upper Abdomen: Visualized upper abdomen shows no acute abnormality. Musculoskeletal: Degenerative changes of the thoracic spine are seen. No acute rib abnormality is noted. IMPRESSION: Bilateral parenchymal scarring without acute infiltrate. Aberrant right subclavian artery. Aortic Atherosclerosis (ICD10-I70.0). Electronically Signed   By: Inez Catalina M.D.   On: 06/07/2022 22:09    Myoview : normal myocardial perfusion scan no evidence of stress-induced myocardial ischemia ejection fraction of 74% conclusion low risk scan  Echo pending  TELEMETRY:   ASSESSMENT AND PLAN:  Principal Problem:   Orthostatic hypotension Active Problems:   Weakness   Hypothyroidism   BPH (benign prostatic hyperplasia)   Acute renal failure superimposed on stage 3a chronic kidney disease (HCC)   Chest pain   Dizziness   Hypertension, benign   OSA (obstructive sleep apnea)   Bilateral flank pain   Stage 3b chronic kidney disease (CKD) (HCC)   Dehydration    Plan Status post Myoview which was unremarkable recommend conservative cardiac input Maintain adequate hydration for possible dehydration Consider physical therapy for generalized weakness Sleep study CPAP as necessary for obstructive sleep apnea Continue hypertension management and control Meclizine for vertigo as necessary Continue nephrology input for renal insufficiency  Yolonda Kida, MD 06/09/2022 8:19 PM

## 2022-06-09 NOTE — Progress Notes (Signed)
[facility] Progress Note  Patient: Dakota Harper     GLO:[id number]  DOA: 06/07/2022   PCP: [name], NP       Brief hospital course: This is a 76 year old male who lives in an independent living facility and has severe insomnia, anxiety, hypertension, COPD, hypothyroidism, GERD, BPH. He presents from his facility for generalized weakness and finding of low BP. Patient states he has been sleeping very poorly.  He states he is taking numerous medications for insomnia over the years including Ambien, temazepam, Seroquel, trazodone and neither of these nor the ones he is currently receiving have been effective. In the ED, he was found to be orthostatic, given fluid boluses and continuous fluids. Initial EKG showed junctional bradycardia.  He admits to having "twinges in the left side of his chest and was referred to a cardiologist that [facility] who he has not yet seen.  Subjective:  I evaluated this patient in the morning prior to a stress test.  He states he feels a little stronger today.  Assessment and Plan: Principal Problem:   Orthostatic hypotension   Acute renal failure superimposed on stage 3a chronic kidney disease (HCC) - Cr 2.01 improved to 1.72 with IVF, which appears to be his baseline -Hold losartan and Mobic -Given fluid bolus and IV fluids-he remains orthostatic today - I have ordered a 500 cc normal saline bolus and asked for orthostatics to be rechecked - I have discussed starting TED hose but he declined stating that they have been much too tight for him in the past and I suspect that he will not wear them even if I have ordered for him - A.m. cortisol is low at 5.0- obtain cosyntropin stim test tomorrow - waiting on repeat orthostatics  Active Problems: Junctional bradycardia? - Requested a cardiology eval-consult note is pending - The patient is not on any beta-blockers -Follow-up on Myoview stress test performed today    Chest  pain -Tenderness present in left upper chest wall - Voltaren gel and K-pad ordered - High-sensitivity troponin 3 and 3    Generalized weakness -He attributes this to inability to sleep - Follow - PT consulted and eval is pending  Insomnia - Continue gabapentin, which she states was recently increased but has not helped - I will increase it to 600 mg for tonight  Migraine headaches - He states his neurologist Dr. Starleen Blue prescribed nortriptyline for him    Hypertension, benign -Holding losartan-follow BP    BPH (benign prostatic hyperplasia) -Continue Proscar and Flomax     Hypothyroidism - Continue Synthroid    OSA (obstructive sleep apnea) -Continue CPAP  Gout - Takes as needed colchicine which  Psychological problems - Patient is noted to be on Invega and Depakene because she states he has been told that he has schizoaffective disorder and bipolar disorder  Patient also takes tizanidine and Myrbetriq at bedtime which I have resumed   DVT prophylaxis:  heparin injection 5,000 Units Start: 06/08/22 0130    Code Status: Full Code  Consultants: None Level of Care: Level of care: Telemetry Medical Disposition Plan:  Status is: Inpt  Objective:   Vitals:   06/09/22 0401 06/09/22 0811 06/09/22 1313 06/09/22 1541  BP: 138/68 (!) 157/73 (!) 159/77 (!) 155/76  Pulse: 81 79 83 85  Resp: '15 18 18 18  '$ Temp: 98 F (36.7 C) 98.1 F (36.7 C) 97.7 F (36.5 C) 97.8 F (36.6 C)  TempSrc:  Oral Oral   SpO2: 97% 99%  99% 100%  Weight:      Height:       Filed Weights   06/08/22 0306  Weight: 97.3 kg   Exam: General exam: Appears comfortable  HEENT: PERRLA, oral mucosa moist, no sclera icterus or thrush Respiratory system: Clear to auscultation. Respiratory effort normal. Cardiovascular system: S1 & S2 heard, regular rate and rhythm Gastrointestinal system: Abdomen soft, non-tender, nondistended. Normal bowel sounds   Central nervous system: Alert and oriented. No  focal neurological deficits. MSK: less tenderness in chest wall Extremities: No cyanosis, clubbing or edema Skin: No rashes or ulcers Psychiatry:  Mood & affect appropriate.     Imaging and lab data was personally reviewed    CBC: Recent Labs  Lab 06/07/22 1850 06/08/22 0249  WBC 6.7 7.5  NEUTROABS 4.5  --   HGB 12.5* 12.7*  HCT 38.2* 38.9*  MCV 92.9 92.0  PLT 177 132   Basic Metabolic Panel: Recent Labs  Lab 06/07/22 1850 06/08/22 0249  NA 135 139  K 4.5 5.0  CL 103 106  CO2 27 27  GLUCOSE 122* 123*  BUN 37* 32*  CREATININE 2.01* 1.72*  CALCIUM 8.6* 8.9   GFR: Estimated Creatinine Clearance: 41.3 mL/min (A) (by C-G formula based on SCr of 1.72 mg/dL (H)).  Scheduled Meds:  aspirin EC  81 mg Oral QHS   atorvastatin  10 mg Oral QPM   diclofenac Sodium  2 g Topical QID   fenofibrate  54 mg Oral Daily   finasteride  5 mg Oral Daily   gabapentin  600 mg Oral QHS   heparin  5,000 Units Subcutaneous Q12H   ketoconazole  1 Application Topical See admin instructions   levothyroxine  50 mcg Oral Q0600   mirabegron ER  50 mg Oral Daily   montelukast  5 mg Oral QHS   nortriptyline  150 mg Oral Daily   paliperidone  3 mg Oral QHS   pantoprazole  40 mg Oral Daily   sodium chloride flush  3 mL Intravenous Q12H   tamsulosin  0.4 mg Oral QHS   valproic acid  250 mg Oral BID   Continuous Infusions:  sodium chloride 50 mL/hr at 06/08/22 1510     LOS: 1 day   Author: Debbe Odea  06/09/2022 4:11 PM

## 2022-06-09 NOTE — Plan of Care (Signed)

## 2022-06-09 NOTE — Progress Notes (Signed)
8:30pm- Pt verbalized that gabapentin was not given to him last night and stated that he usually takes it at night time, informed hospitalist personally since she came to the unit at the same time, she asked why pt is here for, informed that due to generalized weakness. Advised pt to ask attending to add it to his list of medication ordered in the morning. Advised by the hospitalist as well to consult to attending doctor in the morning.  11:00pm- Saw at the notes from the attending to increase Gabapentin tonight but there is no order for it.

## 2022-06-10 ENCOUNTER — Other Ambulatory Visit: Payer: Self-pay

## 2022-06-10 DIAGNOSIS — R079 Chest pain, unspecified: Secondary | ICD-10-CM

## 2022-06-10 LAB — BASIC METABOLIC PANEL
Anion gap: 4 — ABNORMAL LOW (ref 5–15)
BUN: 34 mg/dL — ABNORMAL HIGH (ref 8–23)
CO2: 25 mmol/L (ref 22–32)
Calcium: 8.4 mg/dL — ABNORMAL LOW (ref 8.9–10.3)
Chloride: 112 mmol/L — ABNORMAL HIGH (ref 98–111)
Creatinine, Ser: 1.57 mg/dL — ABNORMAL HIGH (ref 0.61–1.24)
GFR, Estimated: 45 mL/min — ABNORMAL LOW (ref >60)
Glucose, Bld: 80 mg/dL (ref 70–99)
Potassium: 4.1 mmol/L (ref 3.5–5.1)
Sodium: 141 mmol/L (ref 135–145)

## 2022-06-10 LAB — ACTH STIMULATION, 3 TIME POINTS
Cortisol, 30 Min: 16 ug/dL
Cortisol, 60 Min: 21.6 ug/dL
Cortisol, Base: 5.7 ug/dL

## 2022-06-10 MED ORDER — PREDNISONE 10 MG PO TABS: 10.0000 mg | ORAL_TABLET | Freq: Every day | ORAL | Status: DC

## 2022-06-10 MED ORDER — PREDNISONE 20 MG PO TABS
20.0000 mg | ORAL_TABLET | Freq: Every day | ORAL | Status: DC
Start: 2022-06-11 — End: 2022-06-12
  Administered 2022-06-11 – 2022-06-12 (×2): 20 mg via ORAL
  Filled 2022-06-10 (×2): qty 1

## 2022-06-10 MED ORDER — PREDNISONE 10 MG PO TABS
10.0000 mg | ORAL_TABLET | ORAL | Status: DC
  Administered 2022-06-10 – 2022-06-11 (×2): 10 mg via ORAL
  Filled 2022-06-10 (×2): qty 1

## 2022-06-10 NOTE — Progress Notes (Signed)
SUBJECTIVE: Patient has no further chest pain   Vitals:   06/09/22 1541 06/09/22 1950 06/10/22 0429 06/10/22 0816  BP: (!) 155/76 (!) 146/74 (!) 121/51 (!) 148/77  Pulse: 85 82 79 81  Resp: '18 16 15 17  '$ Temp: 97.8 F (36.6 C) 98.3 F (36.8 C) 98.1 F (36.7 C) 98 F (36.7 C)  TempSrc:      SpO2: 100% 99% 96% 98%  Weight:      Height:        Intake/Output Summary (Last 24 hours) at 06/10/2022 1021 Last data filed at 06/10/2022 4010 Gross per 24 hour  Intake 1050 ml  Output 850 ml  Net 200 ml    LABS: Basic Metabolic Panel: Recent Labs    06/08/22 0249 06/10/22 0532  NA 139 141  K 5.0 4.1  CL 106 112*  CO2 27 25  GLUCOSE 123* 80  BUN 32* 34*  CREATININE 1.72* 1.57*  CALCIUM 8.9 8.4*   Liver Function Tests: Recent Labs    06/07/22 1850 06/08/22 0249  AST 20 18  ALT 18 18  ALKPHOS 61 53  BILITOT 0.6 0.4  PROT 6.5 6.3*  ALBUMIN 3.6 3.5   No results for input(s): "LIPASE", "AMYLASE" in the last 72 hours. CBC: Recent Labs    06/07/22 1850 06/08/22 0249  WBC 6.7 7.5  NEUTROABS 4.5  --   HGB 12.5* 12.7*  HCT 38.2* 38.9*  MCV 92.9 92.0  PLT 177 194   Cardiac Enzymes: No results for input(s): "CKTOTAL", "CKMB", "CKMBINDEX", "TROPONINI" in the last 72 hours. BNP: Invalid input(s): "POCBNP" D-Dimer: No results for input(s): "DDIMER" in the last 72 hours. Hemoglobin A1C: No results for input(s): "HGBA1C" in the last 72 hours. Fasting Lipid Panel: No results for input(s): "CHOL", "HDL", "LDLCALC", "TRIG", "CHOLHDL", "LDLDIRECT" in the last 72 hours. Thyroid Function Tests: No results for input(s): "TSH", "T4TOTAL", "T3FREE", "THYROIDAB" in the last 72 hours.  Invalid input(s): "FREET3" Anemia Panel: No results for input(s): "VITAMINB12", "FOLATE", "FERRITIN", "TIBC", "IRON", "RETICCTPCT" in the last 72 hours.   PHYSICAL EXAM General: Well developed, well nourished, in no acute distress HEENT:  Normocephalic and atramatic Neck:  No JVD.  Lungs:  Clear bilaterally to auscultation and percussion. Heart: HRRR . Normal S1 and S2 without gallops or murmurs.  Abdomen: Bowel sounds are positive, abdomen soft and non-tender  Msk:  Back normal, normal gait. Normal strength and tone for age. Extremities: No clubbing, cyanosis or edema.   Neuro: Alert and oriented X 3. Psych:  Good affect, responds appropriately  TELEMETRY: Sinus rhythm  ASSESSMENT AND PLAN: Orthostatic hypotension with blood pressure being normal lying down but dropped to 85 systolic standing up.  Patient has obstructive sleep apnea and had episodes of chest pain 3 days ago but no further chest pain.  Nuclear stress test was unremarkable.  Principal Problem:   Orthostatic hypotension Active Problems:   Weakness   Hypothyroidism   BPH (benign prostatic hyperplasia)   Acute renal failure superimposed on stage 3a chronic kidney disease (HCC)   Chest pain   Dizziness   Hypertension, benign   OSA (obstructive sleep apnea)   Bilateral flank pain   Stage 3b chronic kidney disease (CKD) ([place])   Dehydration    Akif Weldy A, MD, [facility] 06/10/2022 10:21 AM

## 2022-06-10 NOTE — Plan of Care (Signed)
  Problem: Education: Goal: Knowledge of General Education information will improve Description: Including pain rating scale, medication(s)/side effects and non-pharmacologic comfort measures Outcome: Progressing   Problem: Health Behavior/Discharge Planning: Goal: Ability to manage health-related needs will improve Outcome: Progressing   Problem: Clinical Measurements: Goal: Ability to maintain clinical measurements within normal limits will improve Outcome: Progressing Goal: Will remain free from infection Outcome: Progressing Goal: Diagnostic test results will improve Outcome: Progressing Goal: Cardiovascular complication will be avoided Outcome: Progressing   Problem: Activity: Goal: Risk for activity intolerance will decrease Outcome: Progressing   Problem: Nutrition: Goal: Adequate nutrition will be maintained Outcome: Progressing   Problem: Clinical Measurements: Goal: Diagnostic test results will improve Outcome: Progressing Goal: Cardiovascular complication will be avoided Outcome: Progressing

## 2022-06-10 NOTE — Plan of Care (Signed)
  Problem: Nutrition: Goal: Adequate nutrition will be maintained Outcome: Progressing   Problem: Coping: Goal: Level of anxiety will decrease Outcome: Progressing   Problem: Pain Managment: Goal: General experience of comfort will improve Outcome: Progressing   

## 2022-06-10 NOTE — Progress Notes (Signed)
[facility] Progress Note  Patient: Dakota Harper     RFF:[id number]  DOA: 06/07/2022   PCP: [name], NP       Brief hospital course: This is a 76 year old male who lives in an independent living facility and has severe insomnia, anxiety, hypertension, COPD, hypothyroidism, GERD, BPH. He presents from his facility for generalized weakness and finding of low BP. Patient states he has been sleeping very poorly.  He states he is taking numerous medications for insomnia over the years including Ambien, temazepam, Seroquel, trazodone and neither of these nor the ones he is currently receiving have been effective. In the ED, he was found to be orthostatic, given fluid boluses and continuous fluids. Initial EKG showed junctional bradycardia.  He admits to having "twinges in the left side of his chest and was referred to a cardiologist that [facility] clinic who he has not yet seen.  Subjective:  He slept better last night. No chest pain. No other complaints.   Assessment and Plan: Principal Problem:   Orthostatic hypotension   Acute renal failure superimposed on stage 3a chronic kidney disease (HCC) - Cr 2.01 improved to 1.57 with IVF  -Hold losartan and Mobic  - given multiple fluid boluses but no improvement in orthostatic hypotension - I have discussed starting TED hose but he declined stating that they have been much too tight for him in the past and I suspect that he will not wear them even if I have ordered for him - A.m. cortisol is low at 5.0-  cosyntropin stim test reveals appropriate rise in Cortisol -  Orthostatic vitals still quite positive 128/72 > 85/64 - start Prednisone 20 mg in AM and 10 mg in afternoon and follow  Active Problems: Junctional bradycardia? - Requested a cardiology eval-consult note is pending - The patient is not on any beta-blockers - in NSR    Chest pain -Tenderness present in left upper chest wall - Voltaren gel and K-pad ordered -  High-sensitivity troponin 3 and 3 - Myoview stress test is negative - improved    Generalized weakness -He attributes this to inability to sleep - Follow - PT consulted and eval is pending  Insomnia - Continue gabapentin at 600 mg which seems to be helping more than 400  Migraine headaches - He states his neurologist Dr. Starleen Blue prescribed nortriptyline for him    Hypertension, benign -Holding losartan-follow BP    BPH (benign prostatic hyperplasia) -Continue Proscar and Flomax     Hypothyroidism - Continue Synthroid    OSA (obstructive sleep apnea) -Continue CPAP  Gout - Takes as needed colchicine which  Psychological problems - Patient is noted to be on Invega and Depakene because she states he has been told that he has schizoaffective disorder and bipolar disorder  Patient also takes tizanidine and Myrbetriq at bedtime which I have resumed   DVT prophylaxis:  heparin injection 5,000 Units Start: 06/08/22 0130    Code Status: Full Code  Consultants: None Level of Care: Level of care: Telemetry Medical Disposition Plan:  Status is: Inpt- still treating orthostatic hypotension  Objective:   Vitals:   06/09/22 1541 06/09/22 1950 06/10/22 0429 06/10/22 0816  BP: (!) 155/76 (!) 146/74 (!) 121/51 (!) 148/77  Pulse: 85 82 79 81  Resp: '18 16 15 17  '$ Temp: 97.8 F (36.6 C) 98.3 F (36.8 C) 98.1 F (36.7 C) 98 F (36.7 C)  TempSrc:      SpO2: 100% 99% 96% 98%  Weight:  Height:       Filed Weights   06/08/22 0306  Weight: 97.3 kg   Exam: General exam: Appears comfortable  HEENT: PERRLA, oral mucosa moist, no sclera icterus or thrush Respiratory system: Clear to auscultation. Respiratory effort normal. Cardiovascular system: S1 & S2 heard, regular rate and rhythm Gastrointestinal system: Abdomen soft, non-tender, nondistended. Normal bowel sounds   Central nervous system: Alert and oriented. No focal neurological deficits. Extremities: No cyanosis,  clubbing or edema Skin: No rashes or ulcers Psychiatry:  Mood & affect appropriate.    Imaging and lab data was personally reviewed    CBC: Recent Labs  Lab 06/07/22 1850 06/08/22 0249  WBC 6.7 7.5  NEUTROABS 4.5  --   HGB 12.5* 12.7*  HCT 38.2* 38.9*  MCV 92.9 92.0  PLT 177 767    Basic Metabolic Panel: Recent Labs  Lab 06/07/22 1850 06/08/22 0249 06/10/22 0532  NA 135 139 141  K 4.5 5.0 4.1  CL 103 106 112*  CO2 '27 27 25  '$ GLUCOSE 122* 123* 80  BUN 37* 32* 34*  CREATININE 2.01* 1.72* 1.57*  CALCIUM 8.6* 8.9 8.4*    GFR: Estimated Creatinine Clearance: 45.3 mL/min (A) (by C-G formula based on SCr of 1.57 mg/dL (H)).  Scheduled Meds:  aspirin EC  81 mg Oral QHS   atorvastatin  10 mg Oral QPM   diclofenac Sodium  2 g Topical QID   fenofibrate  54 mg Oral Daily   finasteride  5 mg Oral Daily   gabapentin  600 mg Oral QHS   heparin  5,000 Units Subcutaneous Q12H   ketoconazole  1 Application Topical See admin instructions   levothyroxine  50 mcg Oral Q0600   mirabegron ER  50 mg Oral Daily   montelukast  5 mg Oral QHS   nortriptyline  150 mg Oral Daily   paliperidone  3 mg Oral QHS   pantoprazole  40 mg Oral Daily   predniSONE  10 mg Oral Q24H   [START ON 06/11/2022] predniSONE  20 mg Oral Daily   sodium chloride flush  3 mL Intravenous Q12H   tamsulosin  0.4 mg Oral QHS   valproic acid  250 mg Oral BID   Continuous Infusions:     LOS: 2 days   Author: Debbe Odea  06/10/2022 2:11 PM

## 2022-06-11 LAB — BASIC METABOLIC PANEL
Anion gap: 6 (ref 5–15)
BUN: 33 mg/dL — ABNORMAL HIGH (ref 8–23)
CO2: 25 mmol/L (ref 22–32)
Calcium: 9.3 mg/dL (ref 8.9–10.3)
Chloride: 108 mmol/L (ref 98–111)
Creatinine, Ser: 1.51 mg/dL — ABNORMAL HIGH (ref 0.61–1.24)
GFR, Estimated: 48 mL/min — ABNORMAL LOW (ref >60)
Glucose, Bld: 120 mg/dL — ABNORMAL HIGH (ref 70–99)
Potassium: 4.1 mmol/L (ref 3.5–5.1)
Sodium: 139 mmol/L (ref 135–145)

## 2022-06-11 NOTE — Progress Notes (Signed)
[facility] Progress Note  Patient: Dakota Harper     QZE:[id number]  DOA: 06/07/2022   PCP: [name], NP       Brief hospital course: This is a 76 year old male who lives in an independent living facility and has severe insomnia, anxiety, hypertension, COPD, hypothyroidism, GERD, BPH. He presents from his facility for generalized weakness and finding of low BP. Patient states he has been sleeping very poorly.  He states he is taking numerous medications for insomnia over the years including Ambien, temazepam, Seroquel, trazodone and neither of these nor the ones he is currently receiving have been effective. In the ED, he was found to be orthostatic, given fluid boluses and continuous fluids. Initial EKG showed junctional bradycardia.  He admits to having "twinges in the left side of his chest and was referred to a cardiologist that [facility] who he has not yet seen.  Subjective:  He has no new complaints.   Assessment and Plan: Principal Problem:   Orthostatic hypotension   Acute renal failure superimposed on stage 3a chronic kidney disease (HCC) - Cr 2.01 improved to 1.57 with IVF  -Hold losartan and Mobic  - given multiple fluid boluses but no improvement in orthostatic hypotension - I have discussed starting TED hose but he declined stating that they have been much too tight for him in the past and I suspect that he will not wear them even if I have ordered for him - A.m. cortisol is low at 5.0-  cosyntropin stim test reveals appropriate rise in Cortisol -  Orthostatic vitals still positive this AM - started Prednisone 20 mg in AM and 10 mg in afternoon and follow  - he just received 20 mg of Cortisol this AM  Active Problems: Junctional bradycardia? - Requested a cardiology eval- no further w/u recommended - The patient is not on any beta-blockers - now in NSR    Chest pain -Tenderness present in left upper chest wall - Voltaren gel and K-pad  ordered - High-sensitivity troponin 3 and 3 - Myoview stress test is negative - improved    Generalized weakness -He attributes this to inability to sleep - PT recommends returning to ALF with HHPT  Insomnia - Continue gabapentin at 600 mg which seems to be helping more than 400  Migraine headaches - He states his neurologist Dr. Starleen Blue prescribed nortriptyline for him    Hypertension, benign -Holding losartan-follow BP    BPH (benign prostatic hyperplasia) -Continue Proscar and Flomax     Hypothyroidism - Continue Synthroid    OSA (obstructive sleep apnea) -Continue CPAP  Gout - Takes as needed colchicine which  Psychological problems - Patient is noted to be on Invega and Depakene because she states he has been told that he has schizoaffective disorder and bipolar disorder  Patient also takes tizanidine and Myrbetriq at bedtime which I have resumed   DVT prophylaxis:  heparin injection 5,000 Units Start: 06/08/22 0130    Code Status: Full Code  Consultants: None Level of Care: Level of care: Telemetry Medical Disposition Plan:  Status is: Inpt- still treating orthostatic hypotension  Objective:   Vitals:   06/10/22 1802 06/10/22 1947 06/11/22 0426 06/11/22 0844  BP: 128/74 137/74 (!) 113/55 (!) 141/74  Pulse: 77 77 76 75  Resp: '16 18 16 16  '$ Temp: 22.6 F (36.6 C) 97.7 F (36.5 C) 98.3 F (36.8 C) 97.6 F (36.4 C)  TempSrc:  Oral Oral   SpO2: 97% 97% 95% 97%  Weight:      Height:       Filed Weights   06/08/22 0306  Weight: 97.3 kg   Exam: General exam: Appears comfortable  HEENT: PERRLA, oral mucosa moist, no sclera icterus or thrush Respiratory system: Clear to auscultation. Respiratory effort normal. Cardiovascular system: S1 & S2 heard, regular rate and rhythm Gastrointestinal system: Abdomen soft, non-tender, nondistended. Normal bowel sounds   Central nervous system: Alert and oriented. No focal neurological deficits. Extremities: No  cyanosis, clubbing or edema Skin: No rashes or ulcers Psychiatry:  Mood & affect appropriate.    Imaging and lab data was personally reviewed    CBC: Recent Labs  Lab 06/07/22 1850 06/08/22 0249  WBC 6.7 7.5  NEUTROABS 4.5  --   HGB 12.5* 12.7*  HCT 38.2* 38.9*  MCV 92.9 92.0  PLT 177 509    Basic Metabolic Panel: Recent Labs  Lab 06/07/22 1850 06/08/22 0249 06/10/22 0532 06/11/22 0456  NA 135 139 141 139  K 4.5 5.0 4.1 4.1  CL 103 106 112* 108  CO2 '27 27 25 25  '$ GLUCOSE 122* 123* 80 120*  BUN 37* 32* 34* 33*  CREATININE 2.01* 1.72* 1.57* 1.51*  CALCIUM 8.6* 8.9 8.4* 9.3    GFR: Estimated Creatinine Clearance: 47.1 mL/min (A) (by C-G formula based on SCr of 1.51 mg/dL (H)).  Scheduled Meds:  aspirin EC  81 mg Oral QHS   atorvastatin  10 mg Oral QPM   diclofenac Sodium  2 g Topical QID   fenofibrate  54 mg Oral Daily   finasteride  5 mg Oral Daily   gabapentin  600 mg Oral QHS   heparin  5,000 Units Subcutaneous Q12H   ketoconazole  1 Application Topical See admin instructions   levothyroxine  50 mcg Oral Q0600   mirabegron ER  50 mg Oral Daily   montelukast  5 mg Oral QHS   nortriptyline  150 mg Oral Daily   paliperidone  3 mg Oral QHS   pantoprazole  40 mg Oral Daily   predniSONE  10 mg Oral Q24H   predniSONE  20 mg Oral Daily   sodium chloride flush  3 mL Intravenous Q12H   tamsulosin  0.4 mg Oral QHS   valproic acid  250 mg Oral BID   Continuous Infusions:     LOS: 3 days   Author: Debbe Odea  06/11/2022 10:52 AM

## 2022-06-11 NOTE — Progress Notes (Signed)
PT Cancellation Note  Patient Details Name: Dakota Harper MRN: [id number] DOB: [birth year]   Cancelled Treatment:    Reason Eval/Treat Not Completed: Other (comment)  Offered and encouraged gait this pm.  Pt stated he just walked with nursing x 1 lap and did well.  Nurse confirms.  He declined further gait at this time.  Stated he was comfortable with mobility.   Chesley Noon 06/11/2022, 12:44 PM

## 2022-06-11 NOTE — Progress Notes (Signed)
SUBJECTIVE: Feeling much better no dizziness chest pain or shortness of breath   Vitals:   06/10/22 1947 06/11/22 0426 06/11/22 0844 06/11/22 1150  BP: 137/74 (!) 113/55 (!) 141/74 118/62  Pulse: 77 76 75 79  Resp: '18 16 16 16  '$ Temp: 97.7 F (36.5 C) 98.3 F (36.8 C) 97.6 F (36.4 C) (!) 97.4 F (36.3 C)  TempSrc: Oral Oral    SpO2: 97% 95% 97% 98%  Weight:      Height:        Intake/Output Summary (Last 24 hours) at 06/11/2022 1222 Last data filed at 06/11/2022 0936 Gross per 24 hour  Intake 1200 ml  Output 1550 ml  Net -350 ml    LABS: Basic Metabolic Panel: Recent Labs    06/10/22 0532 06/11/22 0456  NA 141 139  K 4.1 4.1  CL 112* 108  CO2 25 25  GLUCOSE 80 120*  BUN 34* 33*  CREATININE 1.57* 1.51*  CALCIUM 8.4* 9.3   Liver Function Tests: No results for input(s): "AST", "ALT", "ALKPHOS", "BILITOT", "PROT", "ALBUMIN" in the last 72 hours. No results for input(s): "LIPASE", "AMYLASE" in the last 72 hours. CBC: No results for input(s): "WBC", "NEUTROABS", "HGB", "HCT", "MCV", "PLT" in the last 72 hours. Cardiac Enzymes: No results for input(s): "CKTOTAL", "CKMB", "CKMBINDEX", "TROPONINI" in the last 72 hours. BNP: Invalid input(s): "POCBNP" D-Dimer: No results for input(s): "DDIMER" in the last 72 hours. Hemoglobin A1C: No results for input(s): "HGBA1C" in the last 72 hours. Fasting Lipid Panel: No results for input(s): "CHOL", "HDL", "LDLCALC", "TRIG", "CHOLHDL", "LDLDIRECT" in the last 72 hours. Thyroid Function Tests: No results for input(s): "TSH", "T4TOTAL", "T3FREE", "THYROIDAB" in the last 72 hours.  Invalid input(s): "FREET3" Anemia Panel: No results for input(s): "VITAMINB12", "FOLATE", "FERRITIN", "TIBC", "IRON", "RETICCTPCT" in the last 72 hours.   PHYSICAL EXAM General: Well developed, well nourished, in no acute distress HEENT:  Normocephalic and atramatic Neck:  No JVD.  Lungs: Clear bilaterally to auscultation and percussion. Heart:  HRRR . Normal S1 and S2 without gallops or murmurs.  Abdomen: Bowel sounds are positive, abdomen soft and non-tender  Msk:  Back normal, normal gait. Normal strength and tone for age. Extremities: No clubbing, cyanosis or edema.   Neuro: Alert and oriented X 3. Psych:  Good affect, responds appropriately  TELEMETRY: Normal sinus rhythm  ASSESSMENT AND PLAN: Orthostatic hypotension/chronic renal disease.  Stress test was unremarkable.  Patient is feeling much better although still has systolic blood pressure dropping below 90 upon standing but is not feeling dizzy.  Cardiac point of view patient can be discharged with follow-up in the office in [place] clinic in 1 to 2 weeks.  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 06/11/2022 12:22 PM

## 2022-06-11 NOTE — Plan of Care (Signed)
  Problem: Activity: Goal: Risk for activity intolerance will decrease Outcome: Progressing   Problem: Nutrition: Goal: Adequate nutrition will be maintained Outcome: Progressing   Problem: Coping: Goal: Level of anxiety will decrease Outcome: Progressing   

## 2022-06-12 DIAGNOSIS — I10 Essential (primary) hypertension: Secondary | ICD-10-CM

## 2022-06-12 DIAGNOSIS — R0789 Other chest pain: Secondary | ICD-10-CM

## 2022-06-12 DIAGNOSIS — W19XXXA Unspecified fall, initial encounter: Secondary | ICD-10-CM

## 2022-06-12 MED ORDER — PREDNISONE 10 MG PO TABS: ORAL_TABLET | ORAL | 0 refills | Status: DC

## 2022-06-12 NOTE — Discharge Instructions (Signed)
Please review all of you discharge paperwork on the day of discharge and be sure you have all of your prescribed medications.  Please request your Primary MD to go over all Hospital Tests and Procedure/Radiological results at the follow up Please get all Hospital records sent to your primary MD by signing hospital release before you go home.   In some cases, there will be blood work, cultures and biopsy results pending at the time of your discharge. Please request that your primary care M.D. goes through all the records of your hospital data and follows up on these results.  Please take all your medications with you for your next visit with your Primary MD   Please request your Primary MD to go over all hospital tests and procedure/radiological results at the follow up, please ask your Primary MD to get all Hospital records sent to his/her office.   You must read complete instructions/literature along with all the possible adverse reactions/side effects for all the Medicines you take and that have been prescribed to you. Take any new Medicines after you have completely understood and accpet all the possible adverse reactions/side effects.    Do not drive or operate heavy machinery when taking Pain medications.    Do not take more than prescribed Pain, Sleep and Anxiety Medications  If you have smoked or chewed Tobacco  in the last 2 yrs please stop smoking, stop any regular Alcohol  and or any Recreational drug use.   Wear Seat belts while driving.   If you had Pneumonia or Lung problems at the Hospital: Please get a 2 view Chest X ray done in 6-8 weeks after hospital discharge or sooner if instructed by your Primary MD.   If you have Congestive Heart Failure: Please call your Cardiologist or Primary MD anytime you have any of the following symptoms:  1) 3 pound weight gain in 24 hours or 5 pounds in 1 week  2) shortness of breath, with or without a dry hacking cough  3) swelling in the  hands, feet or stomach  4) if you have to sleep on extra pillows at night in order to breathe 5) Follow cardiac low salt diet and 1.5 lit/day fluid restriction.   If you have Diabetes Accuchecks 4 times/day- once on AM empty stomach and then before each meal. Log in all results and show them to your primary doctor at your next visit. If any glucose reading is under 60 or above 400 call your primary MD immediately.   If you have Seizure/Convulsions/Epilepsy: Please do not drive, operate heavy machinery, participate in activities at heights or participate in high speed sports until you have seen by Primary MD or a Neurologist and advised to do so again. Per [facility] statutes, patients with seizures are not allowed to drive until they have been seizure-free for six months.  Use caution when using heavy equipment or power tools. Avoid working on ladders or at heights. Take showers instead of baths. Ensure the water temperature is not too high on the home water heater. Do not go swimming alone. Do not lock yourself in a room alone (i.e. bathroom). When caring for infants or small children, sit down when holding, feeding, or changing them to minimize risk of injury to the child in the event you have a seizure. Maintain good sleep hygiene. Avoid alcohol.    If you had Gastrointestinal Bleeding: Please ask your Primary MD to check a complete blood count within one week  of discharge or at your next visit. Your endoscopic/colonoscopic biopsies that are pending at the time of discharge, will also need to followed by your Primary MD.  Please note You were cared for by a hospitalist during your hospital stay. If you have any questions about your discharge medications or the care you received while you were in the hospital after you are discharged, you can call the unit and asked to speak with the hospitalist on call if the hospitalist that took care of you is not available. Once you are discharged,  your primary care physician will handle any further medical issues. Please note that NO REFILLS for any discharge medications will be authorized once you are discharged, as it is imperative that you return to your primary care physician (or establish a relationship with a primary care physician if you do not have one) for your aftercare needs so that they can reassess your need for medications and monitor your lab values.   You can reach the hospitalist office at phone [phone] or fax [phone]   If you do not have a primary care physician, you can call [phone] for a physician referral.

## 2022-06-12 NOTE — Plan of Care (Signed)
Problem: Education: Goal: Knowledge of General Education information will improve Description: Including pain rating scale, medication(s)/side effects and non-pharmacologic comfort measures 06/12/2022 1109 by Alferd Apa, RN Outcome: Adequate for Discharge 06/12/2022 0813 by Alferd Apa, RN Outcome: Progressing   Problem: Health Behavior/Discharge Planning: Goal: Ability to manage health-related needs will improve 06/12/2022 1109 by Alferd Apa, RN Outcome: Adequate for Discharge 06/12/2022 0813 by Alferd Apa, RN Outcome: Progressing   Problem: Clinical Measurements: Goal: Ability to maintain clinical measurements within normal limits will improve 06/12/2022 1109 by Alferd Apa, RN Outcome: Adequate for Discharge 06/12/2022 0813 by Alferd Apa, RN Outcome: Progressing Goal: Will remain free from infection 06/12/2022 1109 by Alferd Apa, RN Outcome: Adequate for Discharge 06/12/2022 0813 by Alferd Apa, RN Outcome: Progressing Goal: Diagnostic test results will improve 06/12/2022 1109 by Alferd Apa, RN Outcome: Adequate for Discharge 06/12/2022 0813 by Alferd Apa, RN Outcome: Progressing Goal: Respiratory complications will improve 06/12/2022 1109 by Alferd Apa, RN Outcome: Adequate for Discharge 06/12/2022 0813 by Alferd Apa, RN Outcome: Progressing Goal: Cardiovascular complication will be avoided 06/12/2022 1109 by Alferd Apa, RN Outcome: Adequate for Discharge 06/12/2022 0813 by Alferd Apa, RN Outcome: Progressing   Problem: Activity: Goal: Risk for activity intolerance will decrease 06/12/2022 1109 by Alferd Apa, RN Outcome: Adequate for Discharge 06/12/2022 0813 by Alferd Apa, RN Outcome: Progressing   Problem: Nutrition: Goal: Adequate nutrition will be maintained 06/12/2022 1109 by Alferd Apa, RN Outcome: Adequate for Discharge 06/12/2022 0813 by Alferd Apa, RN Outcome: Progressing   Problem: Coping: Goal: Level of anxiety will  decrease 06/12/2022 1109 by Alferd Apa, RN Outcome: Adequate for Discharge 06/12/2022 0813 by Alferd Apa, RN Outcome: Progressing   Problem: Elimination: Goal: Will not experience complications related to bowel motility 06/12/2022 1109 by Alferd Apa, RN Outcome: Adequate for Discharge 06/12/2022 0813 by Alferd Apa, RN Outcome: Progressing Goal: Will not experience complications related to urinary retention 06/12/2022 1109 by Alferd Apa, RN Outcome: Adequate for Discharge 06/12/2022 0813 by Alferd Apa, RN Outcome: Progressing   Problem: Pain Managment: Goal: General experience of comfort will improve 06/12/2022 1109 by Alferd Apa, RN Outcome: Adequate for Discharge 06/12/2022 0813 by Alferd Apa, RN Outcome: Progressing   Problem: Safety: Goal: Ability to remain free from injury will improve 06/12/2022 1109 by Alferd Apa, RN Outcome: Adequate for Discharge 06/12/2022 0813 by Alferd Apa, RN Outcome: Progressing   Problem: Skin Integrity: Goal: Risk for impaired skin integrity will decrease 06/12/2022 1109 by Alferd Apa, RN Outcome: Adequate for Discharge 06/12/2022 0813 by Alferd Apa, RN Outcome: Progressing   Problem: Education: Goal: Knowledge of General Education information will improve Description: Including pain rating scale, medication(s)/side effects and non-pharmacologic comfort measures 06/12/2022 1109 by Alferd Apa, RN Outcome: Adequate for Discharge 06/12/2022 0813 by Alferd Apa, RN Outcome: Progressing   Problem: Health Behavior/Discharge Planning: Goal: Ability to manage health-related needs will improve 06/12/2022 1109 by Alferd Apa, RN Outcome: Adequate for Discharge 06/12/2022 0813 by Alferd Apa, RN Outcome: Progressing   Problem: Clinical Measurements: Goal: Ability to maintain clinical measurements within normal limits will improve 06/12/2022 1109 by Alferd Apa, RN Outcome: Adequate for Discharge 06/12/2022 0813 by Alferd Apa, RN Outcome: Progressing Goal: Will remain free from infection 06/12/2022 1109 by Alferd Apa, RN Outcome: Adequate for Discharge 06/12/2022 0813 by  Kandiss Ihrig, Alfredo Batty, RN Outcome: Progressing Goal: Diagnostic test results will improve 06/12/2022 1109 by Alferd Apa, RN Outcome: Adequate for Discharge 06/12/2022 0813 by Alferd Apa, RN Outcome: Progressing Goal: Respiratory complications will improve 06/12/2022 1109 by Alferd Apa, RN Outcome: Adequate for Discharge 06/12/2022 0813 by Alferd Apa, RN Outcome: Progressing Goal: Cardiovascular complication will be avoided 06/12/2022 1109 by Alferd Apa, RN Outcome: Adequate for Discharge 06/12/2022 0813 by Alferd Apa, RN Outcome: Progressing   Problem: Activity: Goal: Risk for activity intolerance will decrease 06/12/2022 1109 by Alferd Apa, RN Outcome: Adequate for Discharge 06/12/2022 0813 by Alferd Apa, RN Outcome: Progressing   Problem: Nutrition: Goal: Adequate nutrition will be maintained 06/12/2022 1109 by Alferd Apa, RN Outcome: Adequate for Discharge 06/12/2022 0813 by Alferd Apa, RN Outcome: Progressing   Problem: Coping: Goal: Level of anxiety will decrease 06/12/2022 1109 by Alferd Apa, RN Outcome: Adequate for Discharge 06/12/2022 0813 by Alferd Apa, RN Outcome: Progressing   Problem: Elimination: Goal: Will not experience complications related to bowel motility 06/12/2022 1109 by Alferd Apa, RN Outcome: Adequate for Discharge 06/12/2022 0813 by Alferd Apa, RN Outcome: Progressing Goal: Will not experience complications related to urinary retention 06/12/2022 1109 by Alferd Apa, RN Outcome: Adequate for Discharge 06/12/2022 0813 by Alferd Apa, RN Outcome: Progressing   Problem: Pain Managment: Goal: General experience of comfort will improve 06/12/2022 1109 by Alferd Apa, RN Outcome: Adequate for Discharge 06/12/2022 0813 by Alferd Apa, RN Outcome: Progressing   Problem: Safety: Goal:  Ability to remain free from injury will improve 06/12/2022 1109 by Alferd Apa, RN Outcome: Adequate for Discharge 06/12/2022 0813 by Alferd Apa, RN Outcome: Progressing   Problem: Skin Integrity: Goal: Risk for impaired skin integrity will decrease 06/12/2022 1109 by Alferd Apa, RN Outcome: Adequate for Discharge 06/12/2022 0813 by Alferd Apa, RN Outcome: Progressing

## 2022-06-12 NOTE — Progress Notes (Signed)
Report given to [place] at [facility]. Attempting to contact transportation from [place] [phone]) to arrange for discharge. Otherwise, Pt ready for discharge.

## 2022-06-12 NOTE — Plan of Care (Signed)

## 2022-06-12 NOTE — Plan of Care (Signed)
  Problem: Clinical Measurements: Goal: Ability to maintain clinical measurements within normal limits will improve Outcome: Progressing Goal: Diagnostic test results will improve Outcome: Progressing   Problem: Clinical Measurements: Goal: Ability to maintain clinical measurements within normal limits will improve Outcome: Progressing Goal: Diagnostic test results will improve Outcome: Progressing   Problem: Activity: Goal: Risk for activity intolerance will decrease Outcome: Progressing   Problem: Activity: Goal: Risk for activity intolerance will decrease Outcome: Progressing   Problem: Nutrition: Goal: Adequate nutrition will be maintained Outcome: Progressing

## 2022-06-12 NOTE — Progress Notes (Signed)
Physical Therapy Treatment Patient Details Name: Dakota Harper MRN: [id number] DOB: [birth year] Today's Date: 06/12/2022   History of Present Illness Pt is a 76 year old male who lives in an independent living facility and has severe insomnia, anxiety, hypertension, COPD, hypothyroidism, GERD, BPH.  He presents from his facility for generalized weakness and finding of low BP.    PT Comments    OOB and completes x 1 lap on unit with min guard.  Some unsteadiness but no assist needed.  Stated he has RW and WC at home.    Recommendations for follow up therapy are one component of a multi-disciplinary discharge planning process, led by the attending physician.  Recommendations may be updated based on patient status, additional functional criteria and insurance authorization.  Follow Up Recommendations  Home health PT     Assistance Recommended at Discharge Intermittent Supervision/Assistance  Patient can return home with the following A little help with walking and/or transfers;Help with stairs or ramp for entrance   Equipment Recommendations  None recommended by PT    Recommendations for Other Services       Precautions / Restrictions Restrictions Weight Bearing Restrictions: No     Mobility  Bed Mobility Overal bed mobility: Modified Independent                  Transfers Overall transfer level: Modified independent                      Ambulation/Gait Ambulation/Gait assistance: Min guard Gait Distance (Feet): 160 Feet Assistive device: Rolling walker (2 wheels) Gait Pattern/deviations: Step-through pattern, Trunk flexed Gait velocity: decreased     General Gait Details: some unsteadiness but no LOB   Stairs             Wheelchair Mobility    Modified Rankin (Stroke Patients Only)       Balance Overall balance assessment: Mild deficits observed, not formally tested                                           Cognition Arousal/Alertness: Awake/alert Behavior During Therapy: WFL for tasks assessed/performed Overall Cognitive Status: Within Functional Limits for tasks assessed                                          Exercises      General Comments        Pertinent Vitals/Pain Pain Assessment Pain Assessment: No/denies pain    Home Living                          Prior Function            PT Goals (current goals can now be found in the care plan section) Progress towards PT goals: Progressing toward goals    Frequency    Min 2X/week      PT Plan Current plan remains appropriate    Co-evaluation              AM-PAC PT "6 Clicks" Mobility   Outcome Measure  Help needed turning from your back to your side while in a flat bed without using bedrails?: None Help needed moving from lying on your back to sitting on the side  of a flat bed without using bedrails?: None Help needed moving to and from a bed to a chair (including a wheelchair)?: None Help needed standing up from a chair using your arms (e.g., wheelchair or bedside chair)?: None Help needed to walk in hospital room?: A Little Help needed climbing 3-5 steps with a railing? : A Little 6 Click Score: 22    End of Session Equipment Utilized During Treatment: Gait belt Activity Tolerance: Patient tolerated treatment well Patient left: in bed;with call bell/phone within reach;with bed alarm set Nurse Communication: Mobility status PT Visit Diagnosis: Other abnormalities of gait and mobility (R26.89);Muscle weakness (generalized) (M62.81)     Time: 0263-7858 PT Time Calculation (min) (ACUTE ONLY): 8 min  Charges:  $Gait Training: 8-22 mins                   Chesley Noon, PTA 06/12/22, 9:22 AM

## 2022-06-12 NOTE — Discharge Summary (Addendum)
Physician Discharge Summary  Dakota Harper CHY:[id number] DOB: February 26, 1946 DOA: 06/07/2022  PCP: [name], NP  Admit date: 06/07/2022 Discharge date: 06/12/2022 Discharging to: Independent living with home health Recommendations for Outpatient Follow-up:  Please make a referral to endocrinology for adrenal insufficiency  Bmet in 1 wk please  Consults:  Cardiology Procedures:  none   Discharge Diagnoses:   Principal Problem:   Orthostatic hypotension- probable adrenal insufficiency  Active Problems:   Chest pain- musculoskeletal   Acute renal failure superimposed on stage 3a chronic kidney disease (HCC)   Stage 3b chronic kidney disease (CKD) (HCC)   Dehydration   Hypertension, benign   BPH (benign prostatic hyperplasia)   Hypothyroidism   OSA (obstructive sleep apnea)    Brief hospital course: This is a 76 year old male who lives in an independent living facility and has severe insomnia, anxiety, hypertension, COPD, hypothyroidism, GERD, BPH. He presents from his facility for generalized weakness and finding of low BP. Patient states he has been sleeping very poorly.  He states he is taking numerous medications for insomnia over the years including Ambien, temazepam, Seroquel, trazodone and neither of these nor the ones he is currently receiving have been effective. In the ED, he was found to be orthostatic, given fluid boluses and continuous fluids. Initial EKG showed junctional bradycardia.  He admits to having "twinges in the left side of his chest and was referred to a cardiologist that [facility] clinic who he has not yet seen.   Subjective:  He has no new complaints.    Assessment and Plan: Principal Problem:   Orthostatic hypotension- probable adrenal insufficiency    Acute renal failure superimposed on stage 3b chronic kidney disease (HCC) - Cr 2.01 improved to 1.51 with IVF  -Holding losartan and Mobic  - given multiple fluid boluses but no improvement in  orthostatic hypotension - I have discussed starting TED hose but he declined stating that they have been much too tight for him in the past and I suspect that he will not wear them even if I have ordered for him - A.m. cortisol is low at 5.0-  cosyntropin stim test reveals appropriate rise in Cortisol - started Prednisone 20 mg in AM and 10 mg in afternoon  -  Orthostatic vitals still positive  but he is now ambulating in the hall w/o symptoms    Active Problems: Junctional bradycardia? - Requested a cardiology eval- no further w/u recommended - The patient is not on any beta-blockers - now in NSR    Chest pain -Tenderness present in left upper chest wall - Voltaren gel and K-pad ordered - High-sensitivity troponin 3 and 3 - Myoview stress test is negative - improved     Generalized weakness -He attributes this to inability to sleep - PT recommends returning to ALF with HHPT   Insomnia - Continue gabapentin at 600 mg which seems to be helping more than 400   Migraine headaches - He states his neurologist Dr. Starleen Blue prescribed nortriptyline for him     Hypertension, benign -can resume Losartan - follow BP     BPH (benign prostatic hyperplasia) -Continue Proscar and Flomax      Hypothyroidism - Continue Synthroid     OSA (obstructive sleep apnea) -Continue CPAP   Gout - Takes as needed colchicine which  Obesity Body mass index is 32.62 kg/m.   Psychological problems - Patient is noted to be on Invega and Depakene because she states he has been told that he has  schizoaffective disorder and bipolar disorder   Patient also takes tizanidine and Myrbetriq at bedtime which I have resumed  Continue to hold Mobic due to high risk for GI side effects with Prednisone.  Cont PPI daily.           Discharge Instructions  Discharge Instructions     Ambulatory referral to Endocrinology   Complete by: As directed    Diet - low sodium heart healthy   Complete by: As  directed    Increase activity slowly   Complete by: As directed       Allergies as of 06/12/2022       Reactions   Saw [place] Hives, Rash   "Berry form"   Other Other (See Comments), Rash   Sulfa Antibiotics Rash   Sulfacetamide Sodium Rash        Medication List     STOP taking these medications    meloxicam 15 MG tablet Commonly known as: MOBIC   methylPREDNISolone 4 MG Tbpk tablet Commonly known as: MEDROL DOSEPAK   oxyCODONE-acetaminophen 5-325 MG tablet Commonly known as: Percocet       TAKE these medications    acetaminophen 500 MG tablet Commonly known as: TYLENOL Take 500 mg by mouth every 4 (four) hours as needed for moderate pain or mild pain.   albuterol 108 (90 Base) MCG/ACT inhaler Commonly known as: VENTOLIN HFA Inhale 1 puff into the lungs 3 (three) times daily as needed for wheezing or shortness of breath.   alum & mag hydroxide-simeth 200-200-20 MG/5ML suspension Commonly known as: MAALOX/MYLANTA Take 30 mLs by mouth 4 (four) times daily as needed for indigestion or heartburn.   aspirin EC 81 MG tablet Take 81 mg by mouth at bedtime. (2100)   atorvastatin 10 MG tablet Commonly known as: LIPITOR Take 10 mg by mouth every evening. (2000)   Breo Ellipta 200-25 MCG/ACT Aepb Generic drug: fluticasone furoate-vilanterol Inhale 1 puff into the lungs daily. (0800)   chlorhexidine 0.12 % solution Commonly known as: PERIDEX Use as directed 15 mLs in the mouth or throat 2 (two) times daily as needed (oral discomfort).   colchicine 0.6 MG tablet Take 1.'2mg'$  (2 tablets) then 0.'6mg'$  (1 tablet) 1 hour after. Then, take 1 tablet every day for 7 days.   cycloSPORINE 0.05 % ophthalmic emulsion Commonly known as: RESTASIS Place 1 drop into both eyes 2 (two) times daily as needed (dry eyes).   diclofenac Sodium 1 % Gel Commonly known as: VOLTAREN Apply 2 g topically as needed (lower left leg pain.).   diphenhydrAMINE 25 MG tablet Commonly known  as: BENADRYL Take 25 mg by mouth every 6 (six) hours as needed (allergic reaction).   Ear Wax Removal Drops 6.5 % OTIC solution Generic drug: carbamide peroxide Place 5 drops into both ears every Sunday.   eucerin cream Apply 1 application topically in the morning and at bedtime. (0800 & 2000)   fenofibrate 54 MG tablet Take 1 tablet (54 mg total) by mouth daily.   finasteride 5 MG tablet Commonly known as: PROSCAR Take 1 tablet (5 mg total) by mouth daily.   Fluocinolone Acetonide Body 0.01 % Oil Apply to scalp 1-2 times a day and leave on.   fluocinonide ointment 0.05 % Commonly known as: LIDEX Apply 1 application topically 2 (two) times daily as needed (pruritus). (apply to legs)   gabapentin 400 MG capsule Commonly known as: NEURONTIN Take 400 mg by mouth at bedtime. (2000)   guaiFENesin 100 MG/5ML liquid  Commonly known as: ROBITUSSIN Take 300 mg by mouth every 6 (six) hours as needed for cough.   ketoconazole 2 % shampoo Commonly known as: NIZORAL Massage into scalp daily and let sit several minutes before rinsing.   ketoconazole 2 % cream Commonly known as: NIZORAL Apply 1 Application topically See admin instructions. Apply 1 - 2 times daily to scalp   lactulose 10 GM/15ML solution Commonly known as: CHRONULAC Take 30 mLs (20 g total) by mouth daily as needed for mild constipation.   levothyroxine 50 MCG tablet Commonly known as: SYNTHROID Take 50 mcg by mouth daily before breakfast. (0630)   loperamide 2 MG capsule Commonly known as: IMODIUM Take 4 mg by mouth as needed for diarrhea or loose stools.   losartan 25 MG tablet Commonly known as: COZAAR Take 25 mg by mouth daily. (0800)   magnesium hydroxide 400 MG/5ML suspension Commonly known as: MILK OF MAGNESIA Take 30 mLs by mouth daily as needed (constipation (no bowel movement for 3 days)).   mirabegron ER 50 MG Tb24 tablet Commonly known as: MYRBETRIQ Take 50 mg by mouth daily. (0900)    montelukast 5 MG chewable tablet Commonly known as: SINGULAIR Chew 5 mg by mouth at bedtime. (2100)   nortriptyline 75 MG capsule Commonly known as: PAMELOR Take 150 mg by mouth daily. (0900)   omeprazole 40 MG capsule Commonly known as: PRILOSEC Take 1 capsule (40 mg total) by mouth daily.   ondansetron 4 MG disintegrating tablet Commonly known as: ZOFRAN-ODT Take 1 tablet (4 mg total) by mouth every 8 (eight) hours as needed for nausea or vomiting.   paliperidone 3 MG 24 hr tablet Commonly known as: INVEGA Take 3 mg by mouth at bedtime.   polyethylene glycol 17 g packet Commonly known as: MIRALAX / GLYCOLAX Take 17 g by mouth daily as needed (constipation.).   predniSONE 10 MG tablet Commonly known as: DELTASONE Take 20 mg at 7 AM daily and 10 mg at 3 PM Do not stop abruptly Start taking on: June 13, 2022   tamsulosin 0.4 MG Caps capsule Commonly known as: FLOMAX Take 0.4 mg by mouth at bedtime. (2100)   tiZANidine 2 MG tablet Commonly known as: ZANAFLEX Take 2 mg by mouth at bedtime. (2000)   valproic acid 250 MG capsule Commonly known as: DEPAKENE Take 250 mg by mouth 2 (two) times daily.   white petrolatum Gel Commonly known as: VASELINE Apply 1 application topically as directed. Apply vaseline to skin lesions and cover with band-aid until healed        Contact information for after-discharge care     Destination     [facility] .   Service: Assisted Living Contact information: [address] [place] [phone]                        The results of significant diagnostics from this hospitalization (including imaging, microbiology, ancillary and laboratory) are listed below for reference.    CT ABDOMEN PELVIS WO CONTRAST  Result Date: 06/08/2022 CLINICAL DATA:  Nausea and vomiting.  Low back pain. EXAM: CT ABDOMEN AND PELVIS WITHOUT CONTRAST TECHNIQUE: Multidetector CT imaging of the  abdomen and pelvis was performed following the standard protocol without IV contrast. RADIATION DOSE REDUCTION: This exam was performed according to the departmental dose-optimization program which includes automated exposure control, adjustment of the mA and/or kV according to patient size and/or use of iterative reconstruction technique.  COMPARISON:  CT 04/02/2016 FINDINGS: Lower chest: Basilar scarring which was characterized on chest CT 3 hours ago. Calcified granuloma in the right lower lobe. Hepatobiliary: No focal hepatic abnormality on this unenhanced exam. Cholecystectomy. No biliary dilatation. Pancreas: Fatty atrophy.  No ductal dilatation or inflammation. Spleen: Normal in size without focal abnormality. Adrenals/Urinary Tract: No adrenal nodule. Renal parenchymal atrophy with symmetric perinephric edema. No hydronephrosis. No renal calculi. Moderate urinary bladder distention without wall thickening or inflammation. Stomach/Bowel: The stomach is distended with fluid. No abnormal gastric wall thickening. No small bowel obstruction or inflammation. Diminutive appendix is tentatively visualized. Focal calcification at the distal appendix is chronic and may represent an appendicolith or dystrophic calcification. Regardless no appendicitis. Moderate volume of stool throughout the colon. No colonic wall thickening or inflammatory change. Vascular/Lymphatic: Moderate aortic atherosclerosis. No aneurysm. No bulky abdominopelvic adenopathy. Reproductive: Prostate gland is enlarged causing mass effect on the bladder base. Prostate spans 5.8 cm AP. Other: No free air, free fluid, or intra-abdominal fluid collection. Minimal fat in the inguinal canals. Musculoskeletal: Prominent degenerative disc disease throughout the lumbar spine. There are no acute or suspicious osseous abnormalities. IMPRESSION: 1. Fluid distended stomach, may represent recent p.o. intake. The possibility of gastroparesis is also considered. 2.  Enlarged prostate gland causing mass effect on the bladder base. Moderate urinary bladder distention without wall thickening or inflammation. 3. Moderate colonic stool burden, can be seen with constipation. Aortic Atherosclerosis (ICD10-I70.0). Electronically Signed   By: Keith Rake M.D.   On: 06/08/2022 01:43   CT Chest Wo Contrast  Result Date: 06/07/2022 CLINICAL DATA:  Respiratory difficulty EXAM: CT CHEST WITHOUT CONTRAST TECHNIQUE: Multidetector CT imaging of the chest was performed following the standard protocol without IV contrast. RADIATION DOSE REDUCTION: This exam was performed according to the departmental dose-optimization program which includes automated exposure control, adjustment of the mA and/or kV according to patient size and/or use of iterative reconstruction technique. COMPARISON:  Chest x-ray from earlier in the same day. FINDINGS: Cardiovascular: Limited due to lack of IV contrast. Aberrant right subclavian artery is noted. Atherosclerotic calcifications of the aorta and its branches are seen. No aneurysmal dilatation is noted. No cardiac enlargement is seen. Scattered coronary calcifications are noted. Mediastinum/Nodes: Thoracic inlet is within normal limits. No hilar or mediastinal adenopathy is noted. The esophagus is within normal limits. Lungs/Pleura: Lungs are well aerated bilaterally. Mild dependent scarring is noted bilaterally. No focal confluent infiltrate or sizable parenchymal nodules are seen. No effusion is noted. Upper Abdomen: Visualized upper abdomen shows no acute abnormality. Musculoskeletal: Degenerative changes of the thoracic spine are seen. No acute rib abnormality is noted. IMPRESSION: Bilateral parenchymal scarring without acute infiltrate. Aberrant right subclavian artery. Aortic Atherosclerosis (ICD10-I70.0). Electronically Signed   By: Inez Catalina M.D.   On: 06/07/2022 22:09   CT HEAD WO CONTRAST (5MM)  Result Date: 06/07/2022 CLINICAL DATA:  Fall  yesterday with increased weakness. History of stroke. EXAM: CT HEAD WITHOUT CONTRAST TECHNIQUE: Contiguous axial images were obtained from the base of the skull through the vertex without intravenous contrast. RADIATION DOSE REDUCTION: This exam was performed according to the departmental dose-optimization program which includes automated exposure control, adjustment of the mA and/or kV according to patient size and/or use of iterative reconstruction technique. COMPARISON:  None Available. FINDINGS: Brain: There is no acute intracranial hemorrhage, extra-axial fluid collection, or acute infarct Parenchymal volume is normal. The ventricles are normal in size. Gray-white differentiation is preserved. There is no mass lesion.  There is no  mass effect or midline shift. Vascular: There is calcification of the bilateral cavernous ICAs. Skull: Normal. Negative for fracture or focal lesion. Sinuses/Orbits: Postsurgical changes are noted in the sinonasal cavity with mild mucosal thickening in the postsurgical sphenoid sinus. The globes and orbits are unremarkable. Other: None. IMPRESSION: No acute intracranial pathology. Electronically Signed   By: Valetta Mole M.D.   On: 06/07/2022 18:31   DG Chest 2 View  Result Date: 06/07/2022 CLINICAL DATA:  Chest pain, weakness, and dehydration. EXAM: CHEST - 2 VIEW COMPARISON:  10/27/2020 FINDINGS: Shallow inspiration. Linear atelectasis or scarring in the lung bases. Heart size and pulmonary vascularity are normal. Previous infiltrates have improved. No developing consolidation or edema. No pleural effusions. No pneumothorax. Mediastinal contours appear intact. Calcification of the aorta. Degenerative changes in the spine and shoulders. IMPRESSION: Shallow inspiration with linear fibrosis or atelectasis in the lung bases. No focal consolidation or edema. Electronically Signed   By: Lucienne Capers M.D.   On: 06/07/2022 18:28   Labs:   Basic Metabolic Panel: Recent Labs  Lab  06/07/22 1850 06/08/22 0249 06/10/22 0532 06/11/22 0456  NA 135 139 141 139  K 4.5 5.0 4.1 4.1  CL 103 106 112* 108  CO2 '27 27 25 25  '$ GLUCOSE 122* 123* 80 120*  BUN 37* 32* 34* 33*  CREATININE 2.01* 1.72* 1.57* 1.51*  CALCIUM 8.6* 8.9 8.4* 9.3     CBC: Recent Labs  Lab 06/07/22 1850 06/08/22 0249  WBC 6.7 7.5  NEUTROABS 4.5  --   HGB 12.5* 12.7*  HCT 38.2* 38.9*  MCV 92.9 92.0  PLT 177 194         SIGNED:   Debbe Odea, MD  [facility] 06/12/2022, 11:17 AM

## 2022-06-12 NOTE — Progress Notes (Signed)
SUBJECTIVE: No chest pain.   Vitals:   06/12/22 0735 06/12/22 0805 06/12/22 0806 06/12/22 0807  BP: (!) 143/64 137/68 (!) 124/58 (!) 112/55  Pulse: 73     Resp: 17     Temp: 98.4 F (36.9 C)     TempSrc:      SpO2: 99%     Weight:      Height:        Intake/Output Summary (Last 24 hours) at 06/12/2022 0901 Last data filed at 06/12/2022 0500 Gross per 24 hour  Intake 1440 ml  Output 300 ml  Net 1140 ml    LABS: Basic Metabolic Panel: Recent Labs    06/10/22 0532 06/11/22 0456  NA 141 139  K 4.1 4.1  CL 112* 108  CO2 25 25  GLUCOSE 80 120*  BUN 34* 33*  CREATININE 1.57* 1.51*  CALCIUM 8.4* 9.3   Liver Function Tests: No results for input(s): "AST", "ALT", "ALKPHOS", "BILITOT", "PROT", "ALBUMIN" in the last 72 hours. No results for input(s): "LIPASE", "AMYLASE" in the last 72 hours. CBC: No results for input(s): "WBC", "NEUTROABS", "HGB", "HCT", "MCV", "PLT" in the last 72 hours. Cardiac Enzymes: No results for input(s): "CKTOTAL", "CKMB", "CKMBINDEX", "TROPONINI" in the last 72 hours. BNP: Invalid input(s): "POCBNP" D-Dimer: No results for input(s): "DDIMER" in the last 72 hours. Hemoglobin A1C: No results for input(s): "HGBA1C" in the last 72 hours. Fasting Lipid Panel: No results for input(s): "CHOL", "HDL", "LDLCALC", "TRIG", "CHOLHDL", "LDLDIRECT" in the last 72 hours. Thyroid Function Tests: No results for input(s): "TSH", "T4TOTAL", "T3FREE", "THYROIDAB" in the last 72 hours.  Invalid input(s): "FREET3" Anemia Panel: No results for input(s): "VITAMINB12", "FOLATE", "FERRITIN", "TIBC", "IRON", "RETICCTPCT" in the last 72 hours.   PHYSICAL EXAM General: Well developed, well nourished, in no acute distress HEENT:  Normocephalic and atramatic Neck:  No JVD.  Lungs: Clear bilaterally to auscultation and percussion. Heart: HRRR . Normal S1 and S2 without gallops or murmurs.  Abdomen: Bowel sounds are positive, abdomen soft and non-tender  Msk:  Back  normal, normal gait. Normal strength and tone for age. Extremities: No clubbing, cyanosis or edema.   Neuro: Alert and oriented X 3. Psych:  Good affect, responds appropriately  TELEMETRY: Sinus rhythm  ASSESSMENT AND PLAN: Orthostatic hypotension with atypical chest pain and chronic kidney disease.  Stress test was unremarkable and is no longer feeling dizzy although still has some orthostatic hypotension.  Due to dehydration mostly.  Principal Problem:   Orthostatic hypotension Active Problems:   Weakness   Hypothyroidism   BPH (benign prostatic hyperplasia)   Acute renal failure superimposed on stage 3a chronic kidney disease (HCC)   Chest pain   Dizziness   Hypertension, benign   OSA (obstructive sleep apnea)   Bilateral flank pain   Stage 3b chronic kidney disease (CKD) ([place])   Dehydration    Rivan Siordia A, MD, [facility] 06/12/2022 9:01 AM

## 2022-07-04 ENCOUNTER — Emergency Department: Payer: 59

## 2022-07-04 ENCOUNTER — Observation Stay
Admission: EM | Admit: 2022-07-04 | Discharge: 2022-07-06 | Disposition: A | Discharge status: Home / Self Care | Payer: 59 | Attending: Internal Medicine | Admitting: Internal Medicine

## 2022-07-04 ENCOUNTER — Other Ambulatory Visit: Payer: Self-pay

## 2022-07-04 ENCOUNTER — Ambulatory Visit (INDEPENDENT_AMBULATORY_CARE_PROVIDER_SITE_OTHER): Payer: 59 | Admitting: Dermatology

## 2022-07-04 DIAGNOSIS — L578 Other skin changes due to chronic exposure to nonionizing radiation: Secondary | ICD-10-CM

## 2022-07-04 DIAGNOSIS — M109 Gout, unspecified: Secondary | ICD-10-CM | POA: Insufficient documentation

## 2022-07-04 DIAGNOSIS — L821 Other seborrheic keratosis: Secondary | ICD-10-CM

## 2022-07-04 DIAGNOSIS — L820 Inflamed seborrheic keratosis: Secondary | ICD-10-CM | POA: Diagnosis not present

## 2022-07-04 DIAGNOSIS — Z1283 Encounter for screening for malignant neoplasm of skin: Secondary | ICD-10-CM

## 2022-07-04 DIAGNOSIS — E039 Hypothyroidism, unspecified: Secondary | ICD-10-CM | POA: Diagnosis not present

## 2022-07-04 DIAGNOSIS — F250 Schizoaffective disorder, bipolar type: Secondary | ICD-10-CM | POA: Insufficient documentation

## 2022-07-04 DIAGNOSIS — Z85828 Personal history of other malignant neoplasm of skin: Secondary | ICD-10-CM

## 2022-07-04 DIAGNOSIS — Z9181 History of falling: Secondary | ICD-10-CM | POA: Diagnosis not present

## 2022-07-04 DIAGNOSIS — L219 Seborrheic dermatitis, unspecified: Secondary | ICD-10-CM

## 2022-07-04 DIAGNOSIS — L814 Other melanin hyperpigmentation: Secondary | ICD-10-CM

## 2022-07-04 DIAGNOSIS — L738 Other specified follicular disorders: Secondary | ICD-10-CM

## 2022-07-04 DIAGNOSIS — R2681 Unsteadiness on feet: Secondary | ICD-10-CM | POA: Insufficient documentation

## 2022-07-04 DIAGNOSIS — R531 Weakness: Secondary | ICD-10-CM | POA: Insufficient documentation

## 2022-07-04 DIAGNOSIS — D225 Melanocytic nevi of trunk: Secondary | ICD-10-CM

## 2022-07-04 DIAGNOSIS — Z86018 Personal history of other benign neoplasm: Secondary | ICD-10-CM

## 2022-07-04 DIAGNOSIS — N1831 Chronic kidney disease, stage 3a: Secondary | ICD-10-CM | POA: Insufficient documentation

## 2022-07-04 DIAGNOSIS — R2689 Other abnormalities of gait and mobility: Secondary | ICD-10-CM | POA: Insufficient documentation

## 2022-07-04 DIAGNOSIS — I951 Orthostatic hypotension: Secondary | ICD-10-CM | POA: Diagnosis not present

## 2022-07-04 DIAGNOSIS — D229 Melanocytic nevi, unspecified: Secondary | ICD-10-CM

## 2022-07-04 DIAGNOSIS — D1800 Hemangioma unspecified site: Secondary | ICD-10-CM

## 2022-07-04 DIAGNOSIS — I959 Hypotension, unspecified: Secondary | ICD-10-CM

## 2022-07-04 LAB — CBC
HCT: 43.4 % (ref 39.0–52.0)
Hemoglobin: 14.2 g/dL (ref 13.0–17.0)
MCH: 31.3 pg (ref 26.0–34.0)
MCHC: 32.7 g/dL (ref 30.0–36.0)
MCV: 95.8 fL (ref 80.0–100.0)
Platelets: 202 10*3/uL (ref 150–400)
RBC: 4.53 MIL/uL (ref 4.22–5.81)
RDW: 14.3 % (ref 11.5–15.5)
WBC: 11.7 10*3/uL — ABNORMAL HIGH (ref 4.0–10.5)
nRBC: 0 % (ref 0.0–0.2)

## 2022-07-04 LAB — BASIC METABOLIC PANEL
Anion gap: 10 (ref 5–15)
BUN: 40 mg/dL — ABNORMAL HIGH (ref 8–23)
CO2: 27 mmol/L (ref 22–32)
Calcium: 9.1 mg/dL (ref 8.9–10.3)
Chloride: 97 mmol/L — ABNORMAL LOW (ref 98–111)
Creatinine, Ser: 2 mg/dL — ABNORMAL HIGH (ref 0.61–1.24)
GFR, Estimated: 34 mL/min — ABNORMAL LOW (ref >60)
Glucose, Bld: 122 mg/dL — ABNORMAL HIGH (ref 70–99)
Potassium: 4.5 mmol/L (ref 3.5–5.1)
Sodium: 134 mmol/L — ABNORMAL LOW (ref 135–145)

## 2022-07-04 LAB — TROPONIN I (HIGH SENSITIVITY)
Troponin I (High Sensitivity): 5 ng/L (ref <18)
Troponin I (High Sensitivity): 4 ng/L (ref <18)

## 2022-07-04 LAB — URINALYSIS, ROUTINE W REFLEX MICROSCOPIC
Bilirubin Urine: NEGATIVE
Glucose, UA: NEGATIVE mg/dL
Hgb urine dipstick: NEGATIVE
Ketones, ur: NEGATIVE mg/dL
Leukocytes,Ua: NEGATIVE
Nitrite: NEGATIVE
Protein, ur: NEGATIVE mg/dL
Specific Gravity, Urine: 1.01 (ref 1.005–1.030)
pH: 5 (ref 5.0–8.0)

## 2022-07-04 LAB — LACTIC ACID, PLASMA: Lactic Acid, Venous: 1.1 mmol/L (ref 0.5–1.9)

## 2022-07-04 MED ORDER — ALBUTEROL SULFATE (2.5 MG/3ML) 0.083% IN NEBU: 2.5000 mg | INHALATION_SOLUTION | RESPIRATORY_TRACT | Status: DC | PRN

## 2022-07-04 MED ORDER — ONDANSETRON HCL 4 MG PO TABS: 4.0000 mg | ORAL_TABLET | Freq: Four times a day (QID) | ORAL | Status: DC | PRN

## 2022-07-04 MED ORDER — FLUOCINOLONE ACETONIDE BODY 0.01 % EX OIL: TOPICAL_OIL | CUTANEOUS | 6 refills | Status: AC

## 2022-07-04 MED ORDER — HEPARIN SODIUM (PORCINE) 5000 UNIT/ML IJ SOLN
5000.0000 [IU] | Freq: Three times a day (TID) | INTRAMUSCULAR | Status: DC
Start: 2022-07-04 — End: 2022-07-06
  Administered 2022-07-04 – 2022-07-06 (×5): 5000 [IU] via SUBCUTANEOUS
  Filled 2022-07-04 (×5): qty 1

## 2022-07-04 MED ORDER — ACETAMINOPHEN 650 MG RE SUPP: 650.0000 mg | Freq: Four times a day (QID) | RECTAL | Status: DC | PRN

## 2022-07-04 MED ORDER — SODIUM CHLORIDE 0.9 % IV BOLUS
1000.0000 mL | Freq: Once | INTRAVENOUS | Status: AC
  Administered 2022-07-04: 1000 mL via INTRAVENOUS

## 2022-07-04 MED ORDER — ONDANSETRON HCL 4 MG/2ML IJ SOLN: 4.0000 mg | Freq: Four times a day (QID) | INTRAMUSCULAR | Status: DC | PRN

## 2022-07-04 MED ORDER — ACETAMINOPHEN 325 MG PO TABS
650.0000 mg | ORAL_TABLET | Freq: Four times a day (QID) | ORAL | Status: DC | PRN
  Administered 2022-07-06: 650 mg via ORAL
  Filled 2022-07-04: qty 2

## 2022-07-04 MED ORDER — SODIUM CHLORIDE 0.9 % IV SOLN: INTRAVENOUS | Status: AC

## 2022-07-04 NOTE — ED Provider Notes (Signed)
[facility] Provider Note    Event Date/Time   First MD Initiated Contact with Patient 07/04/22 1823     (approximate)   History   Weakness   HPI  Dakota Harper is a 76 y.o. male with a history of orthostatic hypotension (thought to be due to adrenal insufficiency) chronic kidney disease, COPD, hypertension, hypothyroidism, BPH, and OSA who presents with generalized weakness and low blood pressure.  The patient was admitted earlier this month for the same.  He was discharged about 2 weeks ago and states he was doing well until the last few days.  He was on a course of steroids which stopped a few days ago.  He reports increased generalized weakness, lightheadedness when he walks around, and has fallen a couple of times.  He denies any nausea or vomiting, chest pain, difficulty breathing, fever or chills.  He has no urinary symptoms.    Physical Exam   Triage Vital Signs: ED Triage Vitals  Enc Vitals Group     BP 07/04/22 1717 (!) 96/59     Pulse Rate 07/04/22 1717 74     Resp 07/04/22 1717 17     Temp 07/04/22 1717 97.8 F (36.6 C)     Temp src --      SpO2 07/04/22 1717 100 %     Weight 07/04/22 1726 205 lb (93 kg)     Height 07/04/22 1726 '5\' 8"'$  (1.727 m)     Head Circumference --      Peak Flow --      Pain Score 07/04/22 1726 0     Pain Loc --      Pain Edu? --      Excl. in [place]? --     Most recent vital signs: Vitals:   07/04/22 2015 07/04/22 2030  BP: (!) 113/59 115/79  Pulse: 67 67  Resp: 16 19  Temp:    SpO2: 99% 100%     General: Alert and oriented, relatively well-appearing. CV:  Good peripheral perfusion.  Resp:  Normal effort.  Abd:  Soft and nontender.  No distention.  Other:  Dry mucous membranes.  EOMI.  PERRLA.  Motor intact in all extremities.  No peripheral edema.   ED Results / Procedures / Treatments   Labs (all labs ordered are listed, but only abnormal results are displayed) Labs Reviewed  BASIC METABOLIC  PANEL - Abnormal; Notable for the following components:      Result Value   Sodium 134 (*)    Chloride 97 (*)    Glucose, Bld 122 (*)    BUN 40 (*)    Creatinine, Ser 2.00 (*)    GFR, Estimated 34 (*)    All other components within normal limits  CBC - Abnormal; Notable for the following components:   WBC 11.7 (*)    All other components within normal limits  LACTIC ACID, PLASMA  URINALYSIS, ROUTINE W REFLEX MICROSCOPIC  CBG MONITORING, ED  TROPONIN I (HIGH SENSITIVITY)  TROPONIN I (HIGH SENSITIVITY)     EKG  ED ECG REPORT I, Arta Silence, the attending physician, personally viewed and interpreted this ECG.  Date: 07/04/2022 EKG Time: 1724 Rate: 84 Rhythm: normal sinus rhythm QRS Axis: normal Intervals: RBBB ST/T Wave abnormalities: Nonspecific ST abnormalities Narrative Interpretation: Nonspecific abnormalities with no evidence of acute ischemia; no significant change when compared to EKG of 06/10/2022    RADIOLOGY  Chest X-ray: I independently viewed and interpreted the images; there  is a small left pleural effusion with no focal consolidation or edema  PROCEDURES:  Critical Care performed: No  Procedures   MEDICATIONS ORDERED IN ED: Medications  sodium chloride 0.9 % bolus 1,000 mL (1,000 mLs Intravenous New Bag/Given 07/04/22 1839)     IMPRESSION / MDM / ASSESSMENT AND PLAN / ED COURSE  I reviewed the triage vital signs and the nursing notes.  46-year-old male with PMH as noted above presents with recurrent weakness, near syncope, and hypotension.  I reviewed the past medical records.  The patient was most recently admitted earlier this month.  Per the hospitalist discharge summary from 8/7 he presented with orthostatic hypotension.  It was thought to be related to adrenal insufficiency as well as possible acute on chronic renal insufficiency.  The patient was started on prednisone which he reports he completed a few days ago.  On exam currently the  patient is overall well-appearing.  He is hypotensive with otherwise normal vital signs.  He is alert and oriented.  Neurologic exam is nonfocal.  Differential diagnosis includes, but is not limited to, adrenal insufficiency, dehydration/hypovolemia, metabolic disturbance, acute infection/sepsis, less likely primary cardiac cause.  Patient's presentation is most consistent with acute presentation with potential threat to life or bodily function.  We will give fluids, obtain lab work-up, and reassess.  Anticipate that the patient may require admission.  The patient is on the cardiac monitor to evaluate for evidence of arrhythmia and/or significant heart rate changes.  ----------------------------------------- 9:12 PM on 07/04/2022 -----------------------------------------  Blood pressure is improved after a liter of normal saline.  The lab work-up is overall reassuring.  The patient has mild leukocytosis and a slight increase in his creatinine from when he was in the hospital earlier in the month.  Troponin is negative.  Lactate is normal.  Urinalysis is pending.  Based on the significant hypotension earlier, I feel that the patient requires admission for further monitoring and work-up.  I consulted Dr. Marcello Moores from the hospitalist service; based on her discussion she agrees to admit the patient.   FINAL CLINICAL IMPRESSION(S) / ED DIAGNOSES   Final diagnoses:  Weakness  Hypotension, unspecified hypotension type     Rx / DC Orders   ED Discharge Orders     None        Note:  This document was prepared using Dragon voice recognition software and may include unintentional dictation errors.    Arta Silence, MD 07/04/22 2113

## 2022-07-04 NOTE — H&P (Incomplete)
History and Physical    Dakota Harper FXT:[id number] DOB: 1946/06/12 DOA: 07/04/2022  PCP: [name], NP  Patient coming from: home  I have personally briefly reviewed patient's old medical records in [place]  Chief Complaint: fall/presyncope with hypotension  HPI: Dakota Harper is a 76 y.o. male with medical history significant of  severe insomnia, anxiety, hypertension, COPD, hypothyroidism, GERD, BPH, CKDIIIa who has interim history of hospitalization 8/2-8/7 with diagnosis of orthostatic hypotension with concern for probable adrenal insufficiency on discharge patient was started on prednisone '20mg'$  in am and '10mg'$  in afternoon, patient at that time was also offered TED hose but refused. Of note during hospitalization he was found to have junctional bradycardia per cardiology no further work up needed.  Patient now returns to ed after progressive presyncope episodes with near fall today. In the field patient was noted to have severe hypotension with systolic in 29J. Per patient he ran out of prednisone around 3 days ago. He does note he continued to take all his other medications. He denies any associated fever/chills/ HA /chest pain/ sob/ diarrhea/ n/v.  Of note no last hospitalization  AM cortisol low at 5, cosyntropin stim test reveals appropriate rise in cortisol per d/c MD making diagnosis of adrenal insufficiency less likely.   ED Course:  Afeb, bp 96/59,-72/40 ( 106/68) hr 74, rr 17 sat 100% on ra  EKG: nsr rbbb, more prominent twave  CE MEQ:[facility] left pleural effusion with left basilar atelectasis Lactic 1.1  Ce 5,4 UA neg Wbc 11.7, plt 202, hgb 14.2 Na 134 (139), K 4.5, cr 2 ( 1.51) Tx ivfs   Review of Systems: As per HPI otherwise 10 point review of systems negative.   Past Medical History:  Diagnosis Date   Anxiety    Arthritis    Asthma    Basal cell carcinoma 07/04/2021   L nasal ala. 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   R scapula, mod atypia   Ear pain    GERD (gastroesophageal reflux disease)    Gout    Headache    History of kidney stones    Hypercholesteremia    Hypertension    Hypothyroidism    Neuropathy    Pneumonia    Sleep disorder     Past Surgical History:  Procedure Laterality Date   ADJACENT TISSUE TRANSFER/TISSUE REARRANGEMENT Left 12/19/2021   Procedure: ADJACENT TISSUE TRANSFER/TISSUE REARRANGEMENT;  Surgeon: Cindra Presume, MD;  Location: [facility];  Service: Plastics;  Laterality: Left;   CHOLECYSTECTOMY     COLONOSCOPY     COLONOSCOPY WITH PROPOFOL N/A 08/01/2021   Procedure: COLONOSCOPY WITH PROPOFOL;  Surgeon: Jonathon Bellows, MD;  Location: [facility] ENDOSCOPY;  Service: Gastroenterology;  Laterality: N/A;   ESOPHAGOGASTRODUODENOSCOPY (EGD) WITH PROPOFOL N/A 11/30/2021   Procedure: ESOPHAGOGASTRODUODENOSCOPY (EGD) WITH PROPOFOL;  Surgeon: Jonathon Bellows, MD;  Location[facility] ENDOSCOPY;  Service: Gastroenterology;  Laterality: N/A;   HERNIA REPAIR     NASAL RECONSTRUCTION Left 12/19/2021   Procedure: NASAL RECONSTRUCTION;  Surgeon: Cindra Presume, MD;  Location: [place];  Service: Plastics;  Laterality: Left;  2 hours   pituitary tumor excision       reports that he has quit smoking. His smoking use included cigarettes. He has never used smokeless tobacco.  He reports that he does not drink alcohol and does not use drugs.  Allergies  Allergen Reactions   Saw [place] Hives and Rash    "Berry form"   Other Other (See Comments) and Rash   Sulfa Antibiotics Rash   Sulfacetamide Sodium Rash    Family History  Problem Relation Age of Onset   Prostate cancer Neg Hx    Bladder Cancer Neg Hx    Kidney cancer Neg Hx     Prior to Admission medications   Medication Sig Start Date End Date Taking? Authorizing Provider  acetaminophen  (TYLENOL) 500 MG tablet Take 500 mg by mouth every 4 (four) hours as needed for moderate pain or mild pain.    [provider]  albuterol (PROVENTIL HFA;VENTOLIN HFA) 108 (90 BASE) MCG/ACT inhaler Inhale 1 puff into the lungs 3 (three) times daily as needed for wheezing or shortness of breath.    [provider]  alum & mag hydroxide-simeth (MAALOX/MYLANTA) 200-200-20 MG/5ML suspension Take 30 mLs by mouth 4 (four) times daily as needed for indigestion or heartburn.    [provider]  aspirin EC 81 MG tablet Take 81 mg by mouth at bedtime. (2100)    [provider]  atorvastatin (LIPITOR) 10 MG tablet Take 10 mg by mouth every evening. (2000)    [provider]  carbamide peroxide (EAR WAX REMOVAL DROPS) 6.5 % OTIC solution Place 5 drops into both ears every Sunday.    [provider]  chlorhexidine (PERIDEX) 0.12 % solution Use as directed 15 mLs in the mouth or throat 2 (two) times daily as needed (oral discomfort).    [provider]  colchicine 0.6 MG tablet Take 1.'2mg'$  (2 tablets) then 0.'6mg'$  (1 tablet) 1 hour after. Then, take 1 tablet every day for 7 days. 12/12/21   McDonald, Stephan Minister, DPM  cycloSPORINE (RESTASIS) 0.05 % ophthalmic emulsion Place 1 drop into both eyes 2 (two) times daily as needed (dry eyes).    [provider]  diclofenac Sodium (VOLTAREN) 1 % GEL Apply 2 g topically as needed (lower left leg pain.). 03/31/21   [provider]  diphenhydrAMINE (BENADRYL) 25 MG tablet Take 25 mg by mouth every 6 (six) hours as needed (allergic reaction).    [provider]  fenofibrate 54 MG tablet Take 1 tablet (54 mg total) by mouth daily. 11/01/20   Sidney Ace, MD  finasteride (PROSCAR) 5 MG tablet Take 1 tablet (5 mg total) by mouth daily. 01/22/20   Zara Council A, PA-C  Fluocinolone Acetonide Body 0.01 % OIL Apply to scalp 1-2 times a day and leave on. 07/04/22   Brendolyn Patty, MD  fluocinonide  ointment (LIDEX) 7.34 % Apply 1 application topically 2 (two) times daily as needed (pruritus). (apply to legs)    [provider]  fluticasone furoate-vilanterol (BREO ELLIPTA) 200-25 MCG/INH AEPB Inhale 1 puff into the lungs daily. (0800) 08/10/20   [provider]  gabapentin (NEURONTIN) 400 MG capsule Take 400 mg by mouth at bedtime. (2000) 11/08/21   [provider]  guaiFENesin (ROBITUSSIN) 100 MG/5ML liquid Take 300 mg by mouth every 6 (six) hours as needed for cough.    [provider]  ketoconazole (NIZORAL) 2 % shampoo Massage into scalp daily and let sit several minutes before rinsing. 10/12/21   Brendolyn Patty, MD  lactulose (CHRONULAC) 10 GM/15ML solution Take 30 mLs (20 g total) by mouth daily as needed for mild constipation. 03/20/21  Paulette Blanch, MD  levothyroxine (SYNTHROID, LEVOTHROID) 50 MCG tablet Take 50 mcg by mouth daily before breakfast. (0630)    [provider]  loperamide (IMODIUM) 2 MG capsule Take 4 mg by mouth as needed for diarrhea or loose stools.    [provider]  losartan (COZAAR) 25 MG tablet Take 25 mg by mouth daily. (0800)    [provider]  magnesium hydroxide (MILK OF MAGNESIA) 400 MG/5ML suspension Take 30 mLs by mouth daily as needed (constipation (no bowel movement for 3 days)).    [provider]  mirabegron ER (MYRBETRIQ) 50 MG TB24 tablet Take 50 mg by mouth daily. (0900)    [provider]  montelukast (SINGULAIR) 5 MG chewable tablet Chew 5 mg by mouth at bedtime. (2100)    [provider]  nortriptyline (PAMELOR) 75 MG capsule Take 150 mg by mouth daily. (0900)    [provider]  omeprazole (PRILOSEC) 40 MG capsule Take 1 capsule (40 mg total) by mouth daily. 11/16/21   Jonathon Bellows, MD  ondansetron (ZOFRAN-ODT) 4 MG disintegrating tablet Take 1 tablet (4 mg total) by mouth every 8 (eight) hours as needed for nausea or vomiting. 12/07/21   Corena Herter,  PA-C  paliperidone (INVEGA) 3 MG 24 hr tablet Take 3 mg by mouth at bedtime.    [provider]  polyethylene glycol (MIRALAX / GLYCOLAX) 17 g packet Take 17 g by mouth daily as needed (constipation.).    [provider]  predniSONE (DELTASONE) 10 MG tablet Take 20 mg at 7 AM daily and 10 mg at 3 PM Do not stop abruptly 06/13/22   Debbe Odea, MD  Skin Protectants, Misc. (EUCERIN) cream Apply 1 application topically in the morning and at bedtime. (0800 & 2000)    [provider]  tamsulosin (FLOMAX) 0.4 MG CAPS capsule Take 0.4 mg by mouth at bedtime. (2100)    [provider]  tiZANidine (ZANAFLEX) 2 MG tablet Take 2 mg by mouth at bedtime. (2000)    [provider]  valproic acid (DEPAKENE) 250 MG capsule Take 250 mg by mouth 2 (two) times daily.    [provider]  white petrolatum (VASELINE) GEL Apply 1 application topically as directed. Apply vaseline to skin lesions and cover with band-aid until healed    [provider]    Physical Exam: Vitals:   07/04/22 1945 07/04/22 2000 07/04/22 2015 07/04/22 2030  BP: (!) 103/58 109/61 (!) 113/59 115/79  Pulse: 69 68 67 67  Resp: (!) '22 11 16 19  '$ Temp:      SpO2: 100% 100% 99% 100%  Weight:      Height:         Vitals:   07/04/22 1945 07/04/22 2000 07/04/22 2015 07/04/22 2030  BP: (!) 103/58 109/61 (!) 113/59 115/79  Pulse: 69 68 67 67  Resp: (!) '22 11 16 19  '$ Temp:      SpO2: 100% 100% 99% 100%  Weight:      Height:      Constitutional: NAD, calm, comfortable Eyes: PERRL, lids and conjunctivae normal ENMT: Mucous membranes are moist. Posterior pharynx clear of any exudate or lesions.Normal dentition.  Neck: normal, supple, no masses, no thyromegaly Respiratory: clear to auscultation bilaterally, no wheezing, no crackles. Normal respiratory effort. No accessory muscle use.  Cardiovascular: Regular rate and rhythm, no murmurs / rubs / gallops. No extremity edema. 2+ pedal  pulses.  Abdomen: no tenderness, no masses palpated.  No hepatosplenomegaly. Bowel sounds positive.  Musculoskeletal: no clubbing / cyanosis. No joint deformity upper and lower extremities. Good ROM, no contractures. Normal muscle tone.  Skin: no rashes, lesions, ulcers. No induration Neurologic: CN 2-12 grossly intact. Sensation intact Strength 5/5 in all 4.  Psychiatric: Normal judgment and insight. Alert and oriented x 3. Normal mood.    Labs on Admission: I have personally reviewed following labs and imaging studies  CBC: Recent Labs  Lab 07/04/22 1731  WBC 11.7*  HGB 14.2  HCT 43.4  MCV 95.8  PLT 269   Basic Metabolic Panel: Recent Labs  Lab 07/04/22 1731  NA 134*  K 4.5  CL 97*  CO2 27  GLUCOSE 122*  BUN 40*  CREATININE 2.00*  CALCIUM 9.1   GFR: Estimated Creatinine Clearance: 34.8 mL/min (A) (by C-G formula based on SCr of 2 mg/dL (H)). Liver Function Tests: No results for input(s): "AST", "ALT", "ALKPHOS", "BILITOT", "PROT", "ALBUMIN" in the last 168 hours. No results for input(s): "LIPASE", "AMYLASE" in the last 168 hours. No results for input(s): "AMMONIA" in the last 168 hours. Coagulation Profile: No results for input(s): "INR", "PROTIME" in the last 168 hours. Cardiac Enzymes: No results for input(s): "CKTOTAL", "CKMB", "CKMBINDEX", "TROPONINI" in the last 168 hours. BNP (last 3 results) No results for input(s): "PROBNP" in the last 8760 hours. HbA1C: No results for input(s): "HGBA1C" in the last 72 hours. CBG: No results for input(s): "GLUCAP" in the last 168 hours. Lipid Profile: No results for input(s): "CHOL", "HDL", "LDLCALC", "TRIG", "CHOLHDL", "LDLDIRECT" in the last 72 hours. Thyroid Function Tests: No results for input(s): "TSH", "T4TOTAL", "FREET4", "T3FREE", "THYROIDAB" in the last 72 hours. Anemia Panel: No results for input(s): "VITAMINB12", "FOLATE", "FERRITIN", "TIBC", "IRON", "RETICCTPCT" in the last 72 hours. Urine analysis:     Component Value Date/Time   COLORURINE YELLOW (A) 06/07/2022 1850   APPEARANCEUR CLEAR (A) 06/07/2022 1850   APPEARANCEUR Clear 09/17/2017 0905   LABSPEC 1.012 06/07/2022 1850   PHURINE 5.0 06/07/2022 1850   GLUCOSEU NEGATIVE 06/07/2022 1850   HGBUR NEGATIVE 06/07/2022 1850   BILIRUBINUR NEGATIVE 06/07/2022 1850   BILIRUBINUR Negative 09/17/2017 0905   KETONESUR NEGATIVE 06/07/2022 1850   PROTEINUR NEGATIVE 06/07/2022 1850   NITRITE NEGATIVE 06/07/2022 1850   LEUKOCYTESUR NEGATIVE 06/07/2022 1850    Radiological Exams on Admission: DG Chest Port 1 View  Result Date: 07/04/2022 CLINICAL DATA:  Weakness. EXAM: PORTABLE CHEST 1 VIEW COMPARISON:  Chest x-ray 06/07/2022 FINDINGS: There is blunting of the left costophrenic angle with atelectasis in the left lung base. Cardiomediastinal silhouette is within normal limits. No pneumothorax or acute fracture. IMPRESSION: 1. Small left pleural effusion with left basilar atelectasis. Electronically Signed   By: Ronney Asters M.D.   On: 07/04/2022 19:20    EKG: Independently reviewed. See above  Assessment/Plan  Orthostatic hypotension nos -resolved with ivfs in ED  -per patient symptoms recurred after he completed his steroid course -holding on restarting steroids as patient electrolytes are stable and patient has responded to ivfs. -will repeat cort stim test  -continue with ivfs  -monitor on tele as patient as hx bradycardia  -EKG note no high grade AV block  -holding on restarting steroids as patient electrolytes are stable and patient has responded to ivfs.  AKI  on CKDIII -avoid nephrotoxic medications  -ivfs overnight   Chronic debility -now with falls due to orthostasis -PT/OT   HTN -hold losartan due to low bp and aki  BPH -hold flomax Janee Morn  due to low bp -monitor for signs of urinary retention  Hypothyroidism -continue synthroid   Gout  -no acute flare   Psych d/o nos -Schizoaffective d/o bipolar  d/o -continue invega , depakene ( check level)  DVT prophylaxis: heparin Code Status: full Family Communication: Disposition Plan:patient  expected to be admitted less than 2 midnights  Consults called: n/a Admission status: progressive    Clance Boll MD [facility]   If 7PM-7AM, please contact night-coverage [email or web address] Password [facility]  07/04/2022, 9:13 PM

## 2022-07-04 NOTE — Progress Notes (Signed)
Follow-Up Visit   Subjective  Dakota Harper is a 76 y.o. male who presents for the following: Annual Exam (Reports new spots lower abdomen hx of seb derm at scalp using dermasmooth oil , ).  The patient presents for Upper Body Skin Exam (UBSE) for skin cancer screening and mole check.  The patient has spots, moles and lesions to be evaluated, some may be new or changing and the patient has concerns that these could be cancer.   The following portions of the chart were reviewed this encounter and updated as appropriate:      Review of Systems: No other skin or systemic complaints except as noted in HPI or Assessment and Plan.   Objective  Well appearing patient in no apparent distress; mood and affect are within normal limits.  All skin waist up examined.  Scalp Clear at exam   right abdomen 4 mm medium dark brown macule- pt states no changes  chest, abdomen, back Multiple nevi at chest , abdomen , and back              left popliteal Erythematous stuck-on, waxy papule   Assessment & Plan  Seborrheic dermatitis Scalp  Chronic condition with duration or expected duration over one year. Currently well-controlled.    Seborrheic Dermatitis  -  is a chronic persistent rash characterized by pinkness and scaling most commonly of the mid face but also can occur on the scalp (dandruff), ears; mid chest, mid back and groin.  It tends to be exacerbated by stress and cooler weather.  People who have neurologic disease may experience new onset or exacerbation of existing seborrheic dermatitis.  The condition is not curable but treatable and can be controlled.  Will d/c ketoconazole shampoo/cream- pt doesn't think it helps  Continue Derma-Smoothe F/S oil Apply to scalp qd/bid prn itch dsp 130m 3Rf.   Try Cerave SA cream prn for dry skin  Fluocinolone Acetonide Body 0.01 % OIL - Scalp Apply to scalp 1-2 times a day and leave on.  Related Medications ketoconazole  (NIZORAL) 2 % shampoo Massage into scalp daily and let sit several minutes before rinsing.  Nevus (2) chest, abdomen, back; right abdomen  Benign-appearing.  Observation.  Call clinic for new or changing lesions.  Recommend daily use of broad spectrum spf 30+ sunscreen to sun-exposed areas.    Inflamed seborrheic keratosis left popliteal  Reassured benign age-related growth.  Recommend observation.  Discussed cryotherapy if spot(s) become irritated or inflamed.   Patient deferred treatment at this time.   Lentigines - Scattered tan macules - Due to sun exposure - Benign-appearing, observe - Recommend daily broad spectrum sunscreen SPF 30+ to sun-exposed areas, reapply every 2 hours as needed. - Call for any changes  Seborrheic Keratoses - Stuck-on, waxy, tan-brown papules and/or plaques including lower abdomen  - Benign-appearing - Discussed benign etiology and prognosis. - Observe - Call for any changes  Sebaceous Hyperplasia At forehead - Small yellow papules with a central dell - Benign - Observe  Melanocytic Nevi - Tan-brown and/or pink-flesh-colored symmetric macules and papules - Benign appearing on exam today - Observation - Call clinic for new or changing moles - Recommend daily use of broad spectrum spf 30+ sunscreen to sun-exposed areas.   Hemangiomas - Red papules - Discussed benign nature - Observe - Call for any changes  Actinic Damage - Chronic condition, secondary to cumulative UV/sun exposure - diffuse scaly erythematous macules with underlying dyspigmentation - Recommend daily broad spectrum sunscreen  SPF 30+ to sun-exposed areas, reapply every 2 hours as needed.  - Staying in the shade or wearing long sleeves, sun glasses (UVA+UVB protection) and wide brim hats (4-inch brim around the entire circumference of the hat) are also recommended for sun protection.  - Call for new or changing lesions.  Skin cancer screening performed  today.  History of Basal Cell Carcinoma of the Skin at left nasal ala - No evidence of recurrence today - Recommend regular full body skin exams - Recommend daily broad spectrum sunscreen SPF 30+ to sun-exposed areas, reapply every 2 hours as needed.  - Call if any new or changing lesions are noted between office visits  History of Dysplastic Nevi - No evidence of recurrence today at right scapula - Recommend regular full body skin exams - Recommend daily broad spectrum sunscreen SPF 30+ to sun-exposed areas, reapply every 2 hours as needed.  - Call if any new or changing lesions are noted between office visits  Return in about 1 year (around 07/05/2023) for upper body exam . I, Ruthell Rummage, CMA, am acting as scribe for Brendolyn Patty, MD.  Documentation: I have reviewed the above documentation for accuracy and completeness, and I agree with the above.  Brendolyn Patty MD

## 2022-07-04 NOTE — ED Notes (Signed)
Pt comes via [facility] from [place] with c/o syncopal episode. Facility reported pt's vitals hypotensive. EMs reports VSS.

## 2022-07-04 NOTE — ED Triage Notes (Signed)
Pt was admitted for weakness 2 weeks ago pt says he never felt better and has still felt weak. Pt BP has been low at times initial triage bp 85/57

## 2022-07-04 NOTE — ED Notes (Signed)
Care initiated at given time. Pt denies pain, N/V, decreased LOC in lying position. Fluids started.

## 2022-07-04 NOTE — Patient Instructions (Addendum)
Seborrheic Keratosis  What causes seborrheic keratoses? Seborrheic keratoses are harmless, common skin growths that first appear during adult life.  As time goes by, more growths appear.  Some people may develop a large number of them.  Seborrheic keratoses appear on both covered and uncovered body parts.  They are not caused by sunlight.  The tendency to develop seborrheic keratoses can be inherited.  They vary in color from skin-colored to gray, brown, or even black.  They can be either smooth or have a rough, warty surface.   Seborrheic keratoses are superficial and look as if they were stuck on the skin.  Under the microscope this type of keratosis looks like layers upon layers of skin.  That is why at times the top layer may seem to fall off, but the rest of the growth remains and re-grows.    Treatment Seborrheic keratoses do not need to be treated, but can easily be removed in the office.  Seborrheic keratoses often cause symptoms when they rub on clothing or jewelry.  Lesions can be in the way of shaving.  If they become inflamed, they can cause itching, soreness, or burning.  Removal of a seborrheic keratosis can be accomplished by freezing, burning, or surgery. If any spot bleeds, scabs, or grows rapidly, please return to have it checked, as these can be an indication of a skin cancer.    Melanoma ABCDEs  Melanoma is the most dangerous type of skin cancer, and is the leading cause of death from skin disease.  You are more likely to develop melanoma if you: Have light-colored skin, light-colored eyes, or red or blond hair Spend a lot of time in the sun Tan regularly, either outdoors or in a tanning bed Have had blistering sunburns, especially during childhood Have a close family member who has had a melanoma Have atypical moles or large birthmarks  Early detection of melanoma is key since treatment is typically straightforward and cure rates are extremely high if we catch it early.    The first sign of melanoma is often a change in a mole or a new dark spot.  The ABCDE system is a way of remembering the signs of melanoma.  A for asymmetry:  The two halves do not match. B for border:  The edges of the growth are irregular. C for color:  A mixture of colors are present instead of an even brown color. D for diameter:  Melanomas are usually (but not always) greater than 6mm - the size of a pencil eraser. E for evolution:  The spot keeps changing in size, shape, and color.  Please check your skin once per month between visits. You can use a small mirror in front and a large mirror behind you to keep an eye on the back side or your body.   If you see any new or changing lesions before your next follow-up, please call to schedule a visit.  Please continue daily skin protection including broad spectrum sunscreen SPF 30+ to sun-exposed areas, reapplying every 2 hours as needed when you're outdoors.   Staying in the shade or wearing long sleeves, sun glasses (UVA+UVB protection) and wide brim hats (4-inch brim around the entire circumference of the hat) are also recommended for sun protection.    Due to recent changes in healthcare laws, you may see results of your pathology and/or laboratory studies on MyChart before the doctors have had a chance to review them. We understand that in some cases there   may be results that are confusing or concerning to you. Please understand that not all results are received at the same time and often the doctors may need to interpret multiple results in order to provide you with the best plan of care or course of treatment. Therefore, we ask that you please give us 2 business days to thoroughly review all your results before contacting the office for clarification. Should we see a critical lab result, you will be contacted sooner.   If You Need Anything After Your Visit  If you have any questions or concerns for your doctor, please call our main  line at [phone] and press option 4 to reach your doctor's medical assistant. If no one answers, please leave a voicemail as directed and we will return your call as soon as possible. Messages left after 4 pm will be answered the following business day.   You may also send us a message via MyChart. We typically respond to MyChart messages within 1-2 business days.  For prescription refills, please ask your pharmacy to contact our office. Our fax number is [phone].  If you have an urgent issue when the clinic is closed that cannot wait until the next business day, you can page your doctor at the number below.    Please note that while we do our best to be available for urgent issues outside of office hours, we are not available 24/7.   If you have an urgent issue and are unable to reach us, you may choose to seek medical care at your doctor's office, retail clinic, urgent care center, or emergency room.  If you have a medical emergency, please immediately call 911 or go to the emergency department.  Pager Numbers  - Dr. Kowalski: [phone]  - Dr. Moye: [phone]  - Dr. Stewart: [phone]  In the event of inclement weather, please call our main line at [phone] for an update on the status of any delays or closures.  Dermatology Medication Tips: Please keep the boxes that topical medications come in in order to help keep track of the instructions about where and how to use these. Pharmacies typically print the medication instructions only on the boxes and not directly on the medication tubes.   If your medication is too expensive, please contact our office at [phone] option 4 or send us a message through MyChart.   We are unable to tell what your co-pay for medications will be in advance as this is different depending on your insurance coverage. However, we may be able to find a substitute medication at lower cost or fill out paperwork to get insurance to cover a  needed medication.   If a prior authorization is required to get your medication covered by your insurance company, please allow us 1-2 business days to complete this process.  Drug prices often vary depending on where the prescription is filled and some pharmacies may offer cheaper prices.  The website www.goodrx.com contains coupons for medications through different pharmacies. The prices here do not account for what the cost may be with help from insurance (it may be cheaper with your insurance), but the website can give you the price if you did not use any insurance.  - You can print the associated coupon and take it with your prescription to the pharmacy.  - You may also stop by our office during regular business hours and pick up a GoodRx coupon card.  - If you need your prescription sent electronically to   a different pharmacy, notify our office through [facility] MyChart or by phone at [phone] option 4.     Si Usted Necesita Algo Despus de Su Visita  Tambin puede enviarnos un mensaje a travs de MyChart. Por lo general respondemos a los mensajes de MyChart en el transcurso de 1 a 2 das hbiles.  Para renovar recetas, por favor pida a su farmacia que se ponga en contacto con nuestra oficina. Nuestro nmero de fax es el [phone].  Si tiene un asunto urgente cuando la clnica est cerrada y que no puede esperar hasta el siguiente da hbil, puede llamar/localizar a su doctor(a) al nmero que aparece a continuacin.   Por favor, tenga en cuenta que aunque hacemos todo lo posible para estar disponibles para asuntos urgentes fuera del horario de oficina, no estamos disponibles las 24 horas del da, los 7 das de la semana.   Si tiene un problema urgente y no puede comunicarse con nosotros, puede optar por buscar atencin mdica  en el consultorio de su doctor(a), en una clnica privada, en un centro de atencin urgente o en una sala de emergencias.  Si tiene una emergencia  mdica, por favor llame inmediatamente al 911 o vaya a la sala de emergencias.  Nmeros de bper  - Dr. Kowalski: [phone]  - Dra. Moye: [phone]  - Dra. Stewart: [phone]  En caso de inclemencias del tiempo, por favor llame a nuestra lnea principal al [phone] para una actualizacin sobre el estado de cualquier retraso o cierre.  Consejos para la medicacin en dermatologa: Por favor, guarde las cajas en las que vienen los medicamentos de uso tpico para ayudarle a seguir las instrucciones sobre dnde y cmo usarlos. Las farmacias generalmente imprimen las instrucciones del medicamento slo en las cajas y no directamente en los tubos del medicamento.   Si su medicamento es muy caro, por favor, pngase en contacto con nuestra oficina llamando al [phone] y presione la opcin 4 o envenos un mensaje a travs de MyChart.   No podemos decirle cul ser su copago por los medicamentos por adelantado ya que esto es diferente dependiendo de la cobertura de su seguro. Sin embargo, es posible que podamos encontrar un medicamento sustituto a menor costo o llenar un formulario para que el seguro cubra el medicamento que se considera necesario.   Si se requiere una autorizacin previa para que su compaa de seguros cubra su medicamento, por favor permtanos de 1 a 2 das hbiles para completar este proceso.  Los precios de los medicamentos varan con frecuencia dependiendo del lugar de dnde se surte la receta y alguna farmacias pueden ofrecer precios ms baratos.  El sitio web www.goodrx.com tiene cupones para medicamentos de diferentes farmacias. Los precios aqu no tienen en cuenta lo que podra costar con la ayuda del seguro (puede ser ms barato con su seguro), pero el sitio web puede darle el precio si no utiliz ningn seguro.  - Puede imprimir el cupn correspondiente y llevarlo con su receta a la farmacia.  - Tambin puede pasar por nuestra oficina durante el horario de  atencin regular y recoger una tarjeta de cupones de GoodRx.  - Si necesita que su receta se enve electrnicamente a una farmacia diferente, informe a nuestra oficina a travs de MyChart de [facility] o por telfono llamando al [phone] y presione la opcin 4.  

## 2022-07-05 ENCOUNTER — Observation Stay (HOSPITAL_BASED_OUTPATIENT_CLINIC_OR_DEPARTMENT_OTHER)
Admit: 2022-07-05 | Discharge: 2022-07-05 | Disposition: A | Discharge status: Home / Self Care | Payer: 59 | Attending: Internal Medicine | Admitting: Internal Medicine

## 2022-07-05 DIAGNOSIS — R55 Syncope and collapse: Secondary | ICD-10-CM

## 2022-07-05 DIAGNOSIS — I951 Orthostatic hypotension: Secondary | ICD-10-CM | POA: Diagnosis not present

## 2022-07-05 LAB — COMPREHENSIVE METABOLIC PANEL
ALT: 18 U/L (ref 0–44)
AST: 15 U/L (ref 15–41)
Albumin: 3.1 g/dL — ABNORMAL LOW (ref 3.5–5.0)
Alkaline Phosphatase: 47 U/L (ref 38–126)
Anion gap: 5 (ref 5–15)
BUN: 38 mg/dL — ABNORMAL HIGH (ref 8–23)
CO2: 28 mmol/L (ref 22–32)
Calcium: 8.6 mg/dL — ABNORMAL LOW (ref 8.9–10.3)
Chloride: 102 mmol/L (ref 98–111)
Creatinine, Ser: 1.71 mg/dL — ABNORMAL HIGH (ref 0.61–1.24)
GFR, Estimated: 41 mL/min — ABNORMAL LOW (ref >60)
Glucose, Bld: 110 mg/dL — ABNORMAL HIGH (ref 70–99)
Potassium: 4.4 mmol/L (ref 3.5–5.1)
Sodium: 135 mmol/L (ref 135–145)
Total Bilirubin: 0.5 mg/dL (ref 0.3–1.2)
Total Protein: 5.6 g/dL — ABNORMAL LOW (ref 6.5–8.1)

## 2022-07-05 LAB — ECHOCARDIOGRAM COMPLETE
AR max vel: 3.29 cm2
AV Area VTI: 3.13 cm2
AV Area mean vel: 2.98 cm2
AV Mean grad: 2.5 mmHg
AV Peak grad: 4.4 mmHg
Ao pk vel: 1.05 m/s
Area-P 1/2: 6.17 cm2
Height: 68 in
S' Lateral: 3.3 cm
Weight: 3280 oz

## 2022-07-05 LAB — CBC
HCT: 40.7 % (ref 39.0–52.0)
Hemoglobin: 13.5 g/dL (ref 13.0–17.0)
MCH: 31.2 pg (ref 26.0–34.0)
MCHC: 33.2 g/dL (ref 30.0–36.0)
MCV: 94 fL (ref 80.0–100.0)
Platelets: 150 10*3/uL (ref 150–400)
RBC: 4.33 MIL/uL (ref 4.22–5.81)
RDW: 14.1 % (ref 11.5–15.5)
WBC: 8.6 10*3/uL (ref 4.0–10.5)
nRBC: 0 % (ref 0.0–0.2)

## 2022-07-05 LAB — ACTH STIMULATION, 3 TIME POINTS
Cortisol, 30 Min: 9.4 ug/dL
Cortisol, 60 Min: 13 ug/dL
Cortisol, Base: 2.1 ug/dL

## 2022-07-05 LAB — CORTISOL-AM, BLOOD: Cortisol - AM: 2.4 ug/dL — ABNORMAL LOW (ref 6.7–22.6)

## 2022-07-05 LAB — VALPROIC ACID LEVEL: Valproic Acid Lvl: 11 ug/mL — ABNORMAL LOW (ref 50.0–100.0)

## 2022-07-05 LAB — TSH: TSH: 2.631 u[IU]/mL (ref 0.350–4.500)

## 2022-07-05 MED ORDER — HYDRALAZINE HCL 20 MG/ML IJ SOLN: 10.0000 mg | INTRAMUSCULAR | Status: DC | PRN

## 2022-07-05 MED ORDER — POLYVINYL ALCOHOL 1.4 % OP SOLN
2.0000 [drp] | OPHTHALMIC | Status: DC | PRN
  Filled 2022-07-05: qty 15

## 2022-07-05 MED ORDER — IPRATROPIUM-ALBUTEROL 0.5-2.5 (3) MG/3ML IN SOLN: 3.0000 mL | RESPIRATORY_TRACT | Status: DC | PRN

## 2022-07-05 MED ORDER — VALPROIC ACID 250 MG PO CAPS
250.0000 mg | ORAL_CAPSULE | Freq: Two times a day (BID) | ORAL | Status: DC
  Administered 2022-07-05 – 2022-07-06 (×2): 250 mg via ORAL
  Filled 2022-07-05 (×2): qty 1

## 2022-07-05 MED ORDER — GUAIFENESIN 100 MG/5ML PO LIQD: 5.0000 mL | ORAL | Status: DC | PRN

## 2022-07-05 MED ORDER — COSYNTROPIN 0.25 MG IJ SOLR
0.2500 mg | Freq: Once | INTRAMUSCULAR | Status: AC
  Administered 2022-07-05: 0.25 mg via INTRAVENOUS
  Filled 2022-07-05 (×2): qty 0.25

## 2022-07-05 MED ORDER — TRAZODONE HCL 50 MG PO TABS: 50.0000 mg | ORAL_TABLET | Freq: Every evening | ORAL | Status: DC | PRN

## 2022-07-05 MED ORDER — SENNOSIDES-DOCUSATE SODIUM 8.6-50 MG PO TABS: 1.0000 | ORAL_TABLET | Freq: Every evening | ORAL | Status: DC | PRN

## 2022-07-05 NOTE — ED Notes (Signed)
Pt up to bathroom again. Steady gait with no weakness noted.

## 2022-07-05 NOTE — Care Management Obs Status (Signed)
[facility] NOTIFICATION   Patient Details  Name: Dakota Harper MRN: [id number] Date of Birth: 20-Mar-1946   Medicare Observation Status Notification Given:  Yes    Shelbie Hutching, RN 07/05/2022, 2:01 PM

## 2022-07-05 NOTE — Evaluation (Signed)
Occupational Therapy Evaluation Patient Details Name: Dakota Harper MRN: [id number] DOB: 12/25/1945 Today's Date: 07/05/2022   History of Present Illness Dakota Harper is a 76yoM who comes to [facility] from the [facility] on 8/29 after syncopal episode. Pt here 3 weeks ago from facility due to hypotension. In triage 8/29 BP as low as 85/62mHg. PMH: CKD, COPD, HTN, hypoTSH, BPH, OSA,  Puportedly pt stopped course of steroids a couple days prior and has had several falls since. Adrenal insufficiency previously suspected as etiology not supported by labs per H&P.   Clinical Impression   Chart reviewed, pt greeted amb from bathroom with supervision with no AD. Pt is alert and oriented x4, reports he is from ALF. Generally MOD I-I in ADL, however has been requiring extra time/ use of AE (shower chair, mwc) since reported falls per pt report. Pt provided education re: DME use, would benefit from extra education to ensure safe ADL completion at home with DME as needed. Pt reports no dizziness throughout mobility, pt orthostatic with no symptoms with PT earlier, BP 137/98 HR 94 while in bed following amb from bathroom. Recommend HHOT following discharge. OT will follow acutely.      Recommendations for follow up therapy are one component of a multi-disciplinary discharge planning process, led by the attending physician.  Recommendations may be updated based on patient status, additional functional criteria and insurance authorization.   Follow Up Recommendations  Home health OT    Assistance Recommended at Discharge Intermittent Supervision/Assistance  Patient can return home with the following Assistance with cooking/housework    Functional Status Assessment  Patient has had a recent decline in their functional status and demonstrates the ability to make significant improvements in function in a reasonable and predictable amount of time.  Equipment Recommendations  Other (comment) (pt has  tub/shower seat at home already)    Recommendations for Other Services       Precautions / Restrictions Precautions Precautions: Fall Restrictions Weight Bearing Restrictions: No      Mobility Bed Mobility Overal bed mobility: Modified Independent                  Transfers Overall transfer level: Needs assistance   Transfers: Sit to/from Stand Sit to Stand: Supervision                  Balance Overall balance assessment: Needs assistance Sitting-balance support: Feet supported Sitting balance-Leahy Scale: Good     Standing balance support: During functional activity Standing balance-Leahy Scale: Good                             ADL either performed or assessed with clinical judgement   ADL Overall ADL's : Needs assistance/impaired     Grooming: Wash/dry hands;Standing Grooming Details (indicate cue type and reason): distant sup             Lower Body Dressing: Supervision/safety   Toilet Transfer: Supervision/safety   Toileting- Clothing Manipulation and Hygiene: Supervision/safety       Functional mobility during ADLs: Supervision/safety;Min guard (approx 75')       Vision Patient Visual Report: No change from baseline       Perception     Praxis      Pertinent Vitals/Pain Pain Assessment Pain Assessment: No/denies pain     Hand Dominance Right   Extremity/Trunk Assessment Upper Extremity Assessment Upper Extremity Assessment: Overall WFL for tasks assessed (Pt with  tremor in B hands, report this has been ongoing for approx 4 yrs; does not affect feeding/grooming tasks)   Lower Extremity Assessment Lower Extremity Assessment: Overall WFL for tasks assessed       Communication Communication Communication: No difficulties   Cognition Arousal/Alertness: Awake/alert Behavior During Therapy: WFL for tasks assessed/performed Overall Cognitive Status: Within Functional Limits for tasks assessed                                  General Comments: Pt is alert and oriented x4, approrpiate awareness of defcits and fair-good safety awareness     General Comments       Exercises Other Exercises Other Exercises: edu re: role of OT, role of rehab, discharge recommendations, safe ADL completion, falls prevention, energy conservation techniques   Shoulder Instructions      Home Living Family/patient expects to be discharged to:: Assisted living                             Home Equipment: Rolling Walker (2 wheels);Wheelchair - Brewing technologist          Prior Functioning/Environment Prior Level of Function : Independent/Modified Independent             Mobility Comments: amb with no AD, has RW and mwc for PRN use that pt reports he has been utilizing more frequently ADLs Comments: pt is MOD I in ADL, assist for IADL as needed, recently more MOD I for bathing, dressing due to feeling "weak"        OT Problem List: Decreased activity tolerance;Decreased knowledge of use of DME or AE      OT Treatment/Interventions: Self-care/ADL training;Patient/family education;Therapeutic exercise;DME and/or AE instruction;Therapeutic activities    OT Goals(Current goals can be found in the care plan section) Acute Rehab OT Goals Patient Stated Goal: go back to PLOF OT Goal Formulation: With patient Time For Goal Achievement: 07/19/22 Potential to Achieve Goals: Good ADL Goals Pt Will Perform Grooming: standing;sitting Pt Will Transfer to Toilet: with modified independence;ambulating;Independently Pt Will Perform Toileting - Clothing Manipulation and hygiene: Independently;with modified independence;sit to/from stand  OT Frequency: Min 2X/week    Co-evaluation              AM-PAC OT "6 Clicks" Daily Activity     Outcome Measure Help from another person eating meals?: None Help from another person taking care of personal grooming?: None Help from another person  toileting, which includes using toliet, bedpan, or urinal?: None Help from another person bathing (including washing, rinsing, drying)?: A Little Help from another person to put on and taking off regular upper body clothing?: None Help from another person to put on and taking off regular lower body clothing?: None 6 Click Score: 23   End of Session Nurse Communication: Mobility status  Activity Tolerance: Patient tolerated treatment well Patient left: in bed;with call bell/phone within reach  OT Visit Diagnosis: Unsteadiness on feet (R26.81)                Time: 2841-3244 OT Time Calculation (min): 12 min Charges:  OT General Charges $OT Visit: 1 Visit OT Evaluation $OT Eval Low Complexity: 1 Low  Shanon Payor, OTD OTR/L  07/05/22, 3:48 PM

## 2022-07-05 NOTE — Evaluation (Signed)
Physical Therapy Evaluation Patient Details Name: Dakota Harper MRN: [id number] DOB: 1946-04-17 Today's Date: 07/05/2022  History of Present Illness  Aristotelis Vilardi is a 44yoM who comes to [facility] from the [facility] on 8/29 after syncopal episode. Pt here 3 weeks ago from facility due to hypotension. In triage 8/29 BP as low as 85/49mHg. PMH: CKD, COPD, HTN, hypoTSH, BPH, OSA,  Puportedly pt stopped course of steroids a couple days prior and has had several falls since. Adrenal insufficiency previously suspected as etiology not supported by labs per H&P.  Clinical Impression  Pt admitted with above Dx. Pt has functional limitations due to deficits below (see "PT Problem List"). Pt able to provide details on baseline functional status. Today pt requires zero physical assistance to perform bed mobility, transfers, and short distance walking without device, however (+) orthostatics preclude farther AMB as lack of symptoms means pt is unable to give warning prior to any potential LOC. Pt educated on having a plan with multiple mobility strategies for days where he is having orthostatic dizziness- he reports he is already doing this to some degree. More complicated, his lack of symptoms may preclude his ability to use symptoms as a safe indicator of mobility potential. Patient's performance this date reveals decreased ability, independence, and tolerance in performing all basic mobility required for performance of activities of daily living. Pt requires additional DME, close physical assistance, and cues for safe participate in mobility. Pt will benefit from skilled PT intervention to increase independence and safety with basic mobility in preparation for discharge to the venue listed below.     Orthostatic VS for the past 24 hrs (Last 3 readings):  BP- Lying Pulse- Lying BP- Sitting Pulse- Sitting BP- Standing at 0 minutes Pulse- Standing at 0 minutes BP- Standing at 3 minutes Pulse- Standing at 3  minutes  07/05/22 1215 153/76 87 117/76 90* 101/64 91* 96/63 93*  *HR empirically indicative of dysautonomia       Recommendations for follow up therapy are one component of a multi-disciplinary discharge planning process, led by the attending physician.  Recommendations may be updated based on patient status, additional functional criteria and insurance authorization.  Follow Up Recommendations Home health PT      Assistance Recommended at Discharge Intermittent Supervision/Assistance  Patient can return home with the following  Help with stairs or ramp for entrance;Assistance with cooking/housework;Assist for transportation;Direct supervision/assist for medications management    Equipment Recommendations None recommended by PT  Recommendations for Other Services       Functional Status Assessment Patient has had a recent decline in their functional status and demonstrates the ability to make significant improvements in function in a reasonable and predictable amount of time.     Precautions / Restrictions Precautions Precautions: Fall Restrictions Weight Bearing Restrictions: No      Mobility  Bed Mobility Overal bed mobility: Modified Independent                  Transfers Overall transfer level: Modified independent                      Ambulation/Gait Ambulation/Gait assistance: Min guard Gait Distance (Feet): 150 Feet Assistive device: None Gait Pattern/deviations: WFL(Within Functional Limits)       General Gait Details: no gross unsteadiness, pt denies any presyncopal prodrome despite (+) orthostatic hypotension. Distance limited by author due to unrelaible symptomology and history of syncope and collapse.  Stairs  Wheelchair Mobility    Modified Rankin (Stroke Patients Only)       Balance                                             Pertinent Vitals/Pain Pain Assessment Pain Assessment:  No/denies pain    Home Living Family/patient expects to be discharged to:: Assisted living                 Home Equipment: Conservation officer, nature (2 wheels);Wheelchair - manual      Prior Function Prior Level of Function : Independent/Modified Independent             Mobility Comments: Pt notes he enjoys walking around [employer] and back, going to [employer]       Hand Dominance   Dominant Hand: Right    Extremity/Trunk Assessment                Communication   Communication: No difficulties  Cognition                                                General Comments      Exercises     Assessment/Plan    PT Assessment Patient needs continued PT services  PT Problem List Decreased strength;Decreased range of motion;Decreased activity tolerance;Decreased balance;Decreased mobility;Decreased coordination;Decreased knowledge of precautions       PT Treatment Interventions DME instruction;Balance training;Gait training;Stair training;Functional mobility training;Therapeutic activities;Therapeutic exercise;Patient/family education    PT Goals (Current goals can be found in the Care Plan section)  Acute Rehab PT Goals Patient Stated Goal: stopping fall and having dizziness limitations PT Goal Formulation: With patient Time For Goal Achievement: 07/19/22 Potential to Achieve Goals: Fair    Frequency Min 2X/week     Co-evaluation               AM-PAC PT "6 Clicks" Mobility  Outcome Measure Help needed turning from your back to your side while in a flat bed without using bedrails?: None Help needed moving from lying on your back to sitting on the side of a flat bed without using bedrails?: None Help needed moving to and from a bed to a chair (including a wheelchair)?: None Help needed standing up from a chair using your arms (e.g., wheelchair or bedside chair)?: None Help needed to walk in hospital room?: A Little Help needed  climbing 3-5 steps with a railing? : A Little 6 Click Score: 22    End of Session Equipment Utilized During Treatment: Gait belt Activity Tolerance: Patient tolerated treatment well;No increased pain;Treatment limited secondary to medical complications (Comment) Patient left: in bed;with call bell/phone within reach Nurse Communication: Mobility status PT Visit Diagnosis: Other abnormalities of gait and mobility (R26.89);History of falling (Z91.81);Other symptoms and signs involving the nervous system (R29.898)    Time: 1200-1230 PT Time Calculation (min) (ACUTE ONLY): 30 min   Charges:   PT Evaluation $PT Eval High Complexity: 1 High PT Treatments $Therapeutic Activity: 8-22 mins       2:20 PM, 07/05/22 Etta Grandchild, PT, DPT Physical Therapist - [facility]  [phone] ([place])    [facility] C 07/05/2022, 2:15 PM

## 2022-07-05 NOTE — TOC Initial Note (Signed)
Transition of Care [facility]) - Initial/Assessment Note    Patient Details  Name: Dakota Harper MRN: [id number] Date of Birth: 04/30/1946  Transition of Care [facility]) CM/SW Contact:    Shelbie Hutching, RN Phone Number: 07/05/2022, 2:17 PM  Clinical Narrative:                 Patient is under observation for orthostatic hypotension.  RNCM met with patient at the bedside in the emergency room.  Patient reports that he is from [facility] facility.  Patient is pretty independent, he usually walks without assistance but he does have a walker and wheelchair when needed.  [facility] [employer] provides all his transportation.   PT and OT evals have been ordered.  Patient up walking to the restroom with minimal assistance.    TOC will cont to follow, potential discharge tomorrow.    Expected Discharge Plan: Assisted Living Barriers to Discharge: Continued Medical Work up   Patient Goals and CMS Choice Patient states their goals for this hospitalization and ongoing recovery are:: to go back to [facility] at discharge      Expected Discharge Plan and Services Expected Discharge Plan: Assisted Living   Discharge Planning Services: CM Consult   Living arrangements for the past 2 months: [place]                 DME Arranged: N/A DME Agency: NA                  Prior Living Arrangements/Services Living arrangements for the past 2 months: [place] Lives with:: Facility Resident Patient language and need for interpreter reviewed:: Yes Do you feel safe going back to the place where you live?: Yes      Need for Family Participation in Patient Care: Yes (Comment) Care giver support system in place?: Yes (comment) Current home services: DME (walker and wheelchair) Criminal Activity/Legal Involvement Pertinent to Current Situation/Hospitalization: No - Comment as needed  Activities of Daily Living Home Assistive Devices/Equipment: Wheelchair ADL Screening  (condition at time of admission) Patient's cognitive ability adequate to safely complete daily activities?: Yes Is the patient deaf or have difficulty hearing?: No Does the patient have difficulty seeing, even when wearing glasses/contacts?: No Does the patient have difficulty concentrating, remembering, or making decisions?: No Patient able to express need for assistance with ADLs?: Yes Does the patient have difficulty dressing or bathing?: No Independently performs ADLs?: Yes (appropriate for developmental age) Does the patient have difficulty walking or climbing stairs?: No Weakness of Legs: None Weakness of Arms/Hands: None  Permission Sought/Granted Permission sought to share information with : Case Manager, Chartered certified accountant granted to share information with : Yes, Verbal Permission Granted     Permission granted to share info w AGENCY: [facility]        Emotional Assessment Appearance:: Appears stated age Attitude/Demeanor/Rapport: Engaged Affect (typically observed): Accepting Orientation: : Oriented to Self, Oriented to Place, Oriented to  Time, Oriented to Situation Alcohol / Substance Use: Not Applicable Psych Involvement: No (comment)  Admission diagnosis:  Orthostatic hypotension [I95.1] Patient Active Problem List   Diagnosis Date Noted   Orthostatic hypotension 06/08/2022   Stage 3b chronic kidney disease (CKD) ([place]) 06/08/2022   Dehydration    Callus of foot 06/09/2021   Acute renal failure superimposed on stage 3a chronic kidney disease ([place]) 10/27/2020   COPD with chronic bronchitis ([place]) 10/27/2020   Ingrown left big toenail 10/25/2020  Ingrown nail of great toe of right foot 10/25/2020   Community acquired bacterial pneumonia 11/25/2019   Sepsis ([place]) 11/25/2019   Hypothyroidism 11/25/2019   BPH (benign prostatic hyperplasia) 11/25/2019   Headache 11/25/2019   Tremors of nervous system 11/25/2019   Thrombocytosis 11/25/2019    Elevated bilirubin 11/25/2019   OSA (obstructive sleep apnea) 11/14/2017   Chronic pain syndrome 10/18/2015   Self-injurious behavior 10/18/2015   Suicidal ideation 10/18/2015   History of substance use 10/17/2015   Generalized abdominal pain 10/07/2015   Chronic idiopathic constipation 09/24/2015   Eczema 06/24/2015   Plantar fasciitis, left 11/19/2014   Chest pain- musculoskeletal 07/03/2014   Leg pain, bilateral 07/03/2014   Varicose veins 07/03/2014   Shoulder bursitis 07/02/2014   Cholelithiasis 01/14/2014   Cataract cortical, senile 01/09/2014   Meibomian gland dysfunction (MGD), bilateral, both upper and lower lids 01/09/2014   Pinguecula of both eyes 01/09/2014   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   Obesity (BMI 35.0-39.9 without comorbidity) 09/26/2013   Pituitary adenoma ([place]) 09/22/2013   Gout 09/18/2013   Allergic bronchitis 08/30/2013   Chronic cough 07/18/2013   Pruritic disorder 06/14/2012   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   Insomnia 09/02/2010   Recurrent depressive disorder ([place]) 01/30/2008   Nonorganic sleep disorder 01/30/2008   Hypercholesterolemia 09/03/2007   Hypertension, benign 09/03/2007   PCP:  [name], NP Pharmacy:   [facility], [place] [address] Phone: [phone] Fax: [phone]  [facility] [place], [place] - [address] [place] [address] Phone: [phone] Fax: [phone]     Social Determinants of Health (SDOH) Interventions    Readmission Risk Interventions     No data to display

## 2022-07-05 NOTE — ED Notes (Signed)
Informed RN bed assigned 

## 2022-07-05 NOTE — Progress Notes (Addendum)
  Orthostatic Vitals assessed during PT evaluation:  07/05/22    Orthostatic Vital signs           Supine   Sitting- 0 min  Sitting- 1 min Standing- 0 min Standing- 1 min  BP (mmHg)  HR (BPM)  BP (mmHg)  HR (BPM)  BP (mmHg)  HR (BPM)  BP (mmHg)  HR (BPM)  BP (mmHg)  HR (BPM)   153/76 87 117/76 90* 120/71 89* 101/64 91* 96/63 93*  *lack of HR increase in response in proportion to BP drop is empirically indicative of dysautonomia.   Despite a history of presyncopal prodrome, pt denies any symptoms with testing at this visit.   12:26 PM, 07/05/22 Etta Grandchild, PT, DPT Physical Therapist - [facility]  [phone] Operating Room Services)

## 2022-07-05 NOTE — Progress Notes (Signed)
PROGRESS NOTE    Ac Dakota Harper  NWG:[id number] DOB: Sep 06, 1946 DOA: 07/04/2022 PCP: Dakota Gist, NP   Brief Narrative:  76 y.o. male with medical history significant of  severe insomnia, anxiety, hypertension, COPD, hypothyroidism, GERD, BPH, CKDIIIa who has interim history of hospitalization 8/2-8/7 with diagnosis of orthostatic hypotension with concern for probable adrenal insufficiency on discharge patient was started on prednisone '20mg'$  in am and '10mg'$  in afternoon, patient at that time was also offered TED hose but refused.  Cardiology did not recommend any further work-up at that time but after going home started having syncopal episodes 24 hours prior to the hospitalization.   Assessment & Plan:  Principal Problem:   Orthostatic hypotension    Orthostasis/presyncope - Unclear etiology.  Has not followed up with endocrine yet.  We will hold off on any antihypertensives.  Also discontinue Flomax for now.  EKG does not show any acute changes.  TSH from 2 weeks ago was overall normal.  We can start midodrine if necessary, in the meantime I ordered TED hose which patient is not too happy about using it.  Echocardiogram, PT/OT ordered   AKI  on CKDIIIa -Baseline creatinine 1.5, peaked at 2.0.  Slowly improving with fluids   Chronic debility Working with PT/OT   HTN -Losartan stopped due to intermittent hypotension   BPH Going forward we will probably hold off on Flomax, continue finasteride.  Needs to follow-up outpatient urology   Hypothyroidism -continue synthroid    Gout  -no acute flare     Psych d/o nos -Schizoaffective d/o bipolar d/o -continue invega , depakene ( check level)   DVT prophylaxis: SQ Heparin Code Status: Full Code Family Communication:   Still Dizzy and orthostasis with PT. Cont IVF Today  Subjective: Seen and examined at bedside.  At rest he feels fine but with movement he feels weak and dizzy especially when he gets up and his blood  pressure goes down.  Concerned he will pass out    Examination:  General exam: Appears calm and comfortable  Respiratory system: Clear to auscultation. Respiratory effort normal. Cardiovascular system: S1 & S2 heard, RRR. No JVD, murmurs, rubs, gallops or clicks. No pedal edema. Gastrointestinal system: Abdomen is nondistended, soft and nontender. No organomegaly or masses felt. Normal bowel sounds heard. Central nervous system: Alert and oriented. No focal neurological deficits. Extremities: Symmetric 5 x 5 power. Skin: No rashes, lesions or ulcers Psychiatry: Judgement and insight appear normal. Mood & affect appropriate.     Objective: Vitals:   07/05/22 0730 07/05/22 0800 07/05/22 1030 07/05/22 1130  BP: 128/78 137/65 127/62 126/70  Pulse: 79 78 84 86  Resp: '14 14 19 '$ (!) 24  Temp:      TempSrc:      SpO2: 100% 100% 100% 100%  Weight:      Height:        Intake/Output Summary (Last 24 hours) at 07/05/2022 1306 Last data filed at 07/04/2022 2206 Gross per 24 hour  Intake 1000 ml  Output --  Net 1000 ml   Filed Weights   07/04/22 1726  Weight: 93 kg     Data Reviewed:   CBC: Recent Labs  Lab 07/04/22 1731 07/05/22 0341  WBC 11.7* 8.6  HGB 14.2 13.5  HCT 43.4 40.7  MCV 95.8 94.0  PLT 202 578   Basic Metabolic Panel: Recent Labs  Lab 07/04/22 1731 07/05/22 0341  NA 134* 135  K 4.5 4.4  CL 97* 102  CO2 27  28  GLUCOSE 122* 110*  BUN 40* 38*  CREATININE 2.00* 1.71*  CALCIUM 9.1 8.6*   GFR: Estimated Creatinine Clearance: 40.6 mL/min (A) (by C-G formula based on SCr of 1.71 mg/dL (H)). Liver Function Tests: Recent Labs  Lab 07/05/22 0341  AST 15  ALT 18  ALKPHOS 47  BILITOT 0.5  PROT 5.6*  ALBUMIN 3.1*   No results for input(s): "LIPASE", "AMYLASE" in the last 168 hours. No results for input(s): "AMMONIA" in the last 168 hours. Coagulation Profile: No results for input(s): "INR", "PROTIME" in the last 168 hours. Cardiac Enzymes: No  results for input(s): "CKTOTAL", "CKMB", "CKMBINDEX", "TROPONINI" in the last 168 hours. BNP (last 3 results) No results for input(s): "PROBNP" in the last 8760 hours. HbA1C: No results for input(s): "HGBA1C" in the last 72 hours. CBG: No results for input(s): "GLUCAP" in the last 168 hours. Lipid Profile: No results for input(s): "CHOL", "HDL", "LDLCALC", "TRIG", "CHOLHDL", "LDLDIRECT" in the last 72 hours. Thyroid Function Tests: Recent Labs    07/04/22 2033  TSH 2.631   Anemia Panel: No results for input(s): "VITAMINB12", "FOLATE", "FERRITIN", "TIBC", "IRON", "RETICCTPCT" in the last 72 hours. Sepsis Labs: Recent Labs  Lab 07/04/22 1926  LATICACIDVEN 1.1    No results found for this or any previous visit (from the past 240 hour(s)).       Radiology Studies: DG Chest Port 1 View  Result Date: 07/04/2022 CLINICAL DATA:  Weakness. EXAM: PORTABLE CHEST 1 VIEW COMPARISON:  Chest x-ray 06/07/2022 FINDINGS: There is blunting of the left costophrenic angle with atelectasis in the left lung base. Cardiomediastinal silhouette is within normal limits. No pneumothorax or acute fracture. IMPRESSION: 1. Small left pleural effusion with left basilar atelectasis. Electronically Signed   By: Ronney Asters M.D.   On: 07/04/2022 19:20        Scheduled Meds:  betamethasone acetate-betamethasone sodium phosphate  3 mg Intra-articular Once   heparin  5,000 Units Subcutaneous Q8H   Continuous Infusions:  sodium chloride 75 mL/hr at 07/05/22 1048     LOS: 1 day   Time spent= 35 mins    Dakota Harper Dakota Loader, MD [facility]  If 7PM-7AM, please contact night-coverage  07/05/2022, 1:06 PM

## 2022-07-05 NOTE — ED Notes (Signed)
Pt up to bathroom again (3-4 times this morning. Pt does not appear weak.

## 2022-07-05 NOTE — Progress Notes (Signed)
*  PRELIMINARY RESULTS* Echocardiogram 2D Echocardiogram has been performed.  Sherrie Sport 07/05/2022, 2:18 PM

## 2022-07-05 NOTE — Care Management CC44 (Signed)
Condition Code 44 Documentation Completed  Patient Details  Name: Dakota Harper MRN: [id number] Date of Birth: Nov 15, 1945   Condition Code 44 given:  Yes Patient signature on Condition Code 44 notice:  Yes Documentation of 2 MD's agreement:  Yes Code 44 added to claim:  Yes    Shelbie Hutching, RN 07/05/2022, 2:01 PM

## 2022-07-05 NOTE — ED Notes (Signed)
This RN has accidentally clicked off the 5am lab draws for CBC and CMP. They will still need to be recollected at 5am.

## 2022-07-06 ENCOUNTER — Ambulatory Visit: Payer: 59 | Admitting: Podiatry

## 2022-07-06 DIAGNOSIS — I951 Orthostatic hypotension: Secondary | ICD-10-CM | POA: Diagnosis not present

## 2022-07-06 LAB — BASIC METABOLIC PANEL
Anion gap: 7 (ref 5–15)
BUN: 29 mg/dL — ABNORMAL HIGH (ref 8–23)
CO2: 24 mmol/L (ref 22–32)
Calcium: 8.9 mg/dL (ref 8.9–10.3)
Chloride: 108 mmol/L (ref 98–111)
Creatinine, Ser: 1.48 mg/dL — ABNORMAL HIGH (ref 0.61–1.24)
GFR, Estimated: 49 mL/min — ABNORMAL LOW (ref >60)
Glucose, Bld: 141 mg/dL — ABNORMAL HIGH (ref 70–99)
Potassium: 4.4 mmol/L (ref 3.5–5.1)
Sodium: 139 mmol/L (ref 135–145)

## 2022-07-06 LAB — CBC
HCT: 41.9 % (ref 39.0–52.0)
Hemoglobin: 13.8 g/dL (ref 13.0–17.0)
MCH: 30.5 pg (ref 26.0–34.0)
MCHC: 32.9 g/dL (ref 30.0–36.0)
MCV: 92.5 fL (ref 80.0–100.0)
Platelets: 164 10*3/uL (ref 150–400)
RBC: 4.53 MIL/uL (ref 4.22–5.81)
RDW: 14 % (ref 11.5–15.5)
WBC: 7 10*3/uL (ref 4.0–10.5)
nRBC: 0 % (ref 0.0–0.2)

## 2022-07-06 LAB — MAGNESIUM: Magnesium: 2.2 mg/dL (ref 1.7–2.4)

## 2022-07-06 MED ORDER — FLUDROCORTISONE ACETATE 0.1 MG PO TABS: ORAL_TABLET | ORAL | 0 refills | Status: DC

## 2022-07-06 NOTE — NC FL2 (Addendum)
[place] LEVEL OF CARE SCREENING TOOL     IDENTIFICATION  Patient Name: Dakota Harper Birthdate: Mar 19, 1946 Sex: male Admission Date (Current Location): 07/04/2022  [facility] and [place] Number:  Engineering geologist and Address:  [facility], [address] [place], [place] [address]      Provider Number: [id number]  Attending Physician Name and Address:  Damita Lack, MD  Relative Name and Phone Number:       Current Level of Care: Hospital Recommended Level of Care: Assisted Living Facility Prior Approval Number:    Date Approved/Denied:   PASRR Number:    Discharge Plan: Other (Comment) (ALF)    Current Diagnoses: Patient Active Problem List   Diagnosis Date Noted   Orthostatic hypotension 06/08/2022   Stage 3b chronic kidney disease (CKD) ([place]) 06/08/2022   Dehydration    Callus of foot 06/09/2021   Acute renal failure superimposed on stage 3a chronic kidney disease ([place]) 10/27/2020   COPD with chronic bronchitis ([place]) 10/27/2020   Ingrown left big toenail 10/25/2020   Ingrown nail of great toe of right foot 10/25/2020   Community acquired bacterial pneumonia 11/25/2019   Sepsis ([place]) 11/25/2019   Hypothyroidism 11/25/2019   BPH (benign prostatic hyperplasia) 11/25/2019   Headache 11/25/2019   Tremors of nervous system 11/25/2019   Thrombocytosis 11/25/2019   Elevated bilirubin 11/25/2019   OSA (obstructive sleep apnea) 11/14/2017   Chronic pain syndrome 10/18/2015   Self-injurious behavior 10/18/2015   Suicidal ideation 10/18/2015   History of substance use 10/17/2015   Generalized abdominal pain 10/07/2015   Chronic idiopathic constipation 09/24/2015   Eczema 06/24/2015   Plantar fasciitis, left 11/19/2014   Chest pain- musculoskeletal 07/03/2014   Leg pain, bilateral 07/03/2014   Varicose veins 07/03/2014   Shoulder bursitis 07/02/2014   Cholelithiasis 01/14/2014   Cataract cortical, senile  01/09/2014   Meibomian gland dysfunction (MGD), bilateral, both upper and lower lids 01/09/2014   Pinguecula of both eyes 01/09/2014   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   Obesity (BMI 35.0-39.9 without comorbidity) 09/26/2013   Pituitary adenoma ([place]) 09/22/2013   Gout 09/18/2013   Allergic bronchitis 08/30/2013   Chronic cough 07/18/2013   Pruritic disorder 06/14/2012   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   Insomnia 09/02/2010   Recurrent depressive disorder ([place]) 01/30/2008   Nonorganic sleep disorder 01/30/2008   Hypercholesterolemia 09/03/2007   Hypertension, benign 09/03/2007    Orientation RESPIRATION BLADDER Height & Weight     Self, Time, Situation, Place  Normal Continent Weight: 93 kg Height:  '5\' 8"'$  (172.7 cm)  BEHAVIORAL SYMPTOMS/MOOD NEUROLOGICAL BOWEL NUTRITION STATUS      Continent Diet (regular)  AMBULATORY STATUS COMMUNICATION OF NEEDS Skin   Supervision Verbally Normal                       Personal Care Assistance Level of Assistance  Bathing, Feeding, Dressing Bathing Assistance: Limited assistance Feeding assistance: Independent Dressing Assistance: Independent     Functional Limitations Info             SPECIAL CARE FACTORS FREQUENCY  PT (By licensed PT)     PT Frequency: enhabit              Contractures Contractures Info: Not present    Additional Factors Info  Code Status, Allergies Code Status Info: Full Allergies Info: Saw palmetto, sulfa antibiotics, sufacetamide sodium           Medication List  STOP taking these medications     colchicine 0.6 MG tablet    losartan 25 MG tablet Commonly known as: COZAAR    predniSONE 10 MG tablet Commonly known as: DELTASONE           TAKE these medications     acetaminophen 500 MG tablet Commonly known as: TYLENOL Take 500 mg by mouth every 4 (four) hours as needed for moderate pain or mild pain.    albuterol 108 (90 Base) MCG/ACT inhaler Commonly known as:  VENTOLIN HFA Inhale 1 puff into the lungs 3 (three) times daily as needed for wheezing or shortness of breath.    alum & mag hydroxide-simeth 200-200-20 MG/5ML suspension Commonly known as: MAALOX/MYLANTA Take 30 mLs by mouth 4 (four) times daily as needed for indigestion or heartburn.    aspirin EC 81 MG tablet Take 81 mg by mouth at bedtime. (2100)    atorvastatin 10 MG tablet Commonly known as: LIPITOR Take 10 mg by mouth every evening. (2000)    Breo Ellipta 200-25 MCG/ACT Aepb Generic drug: fluticasone furoate-vilanterol Inhale 1 puff into the lungs daily. (0800)    chlorhexidine 0.12 % solution Commonly known as: PERIDEX Use as directed 15 mLs in the mouth or throat 2 (two) times daily as needed (oral discomfort).    cycloSPORINE 0.05 % ophthalmic emulsion Commonly known as: RESTASIS Place 1 drop into both eyes 2 (two) times daily as needed (dry eyes).    diclofenac Sodium 1 % Gel Commonly known as: VOLTAREN Apply 2 g topically as needed (lower left leg pain.).    diphenhydrAMINE 25 MG tablet Commonly known as: BENADRYL Take 25 mg by mouth every 6 (six) hours as needed (allergic reaction).    Ear Wax Removal Drops 6.5 % OTIC solution Generic drug: carbamide peroxide Place 5 drops into both ears every Sunday.    eucerin cream Apply 1 application topically in the morning and at bedtime. (0800 & 2000)    fenofibrate 54 MG tablet Take 1 tablet (54 mg total) by mouth daily.    finasteride 5 MG tablet Commonly known as: PROSCAR Take 1 tablet (5 mg total) by mouth daily.    fludrocortisone 0.1 MG tablet Commonly known as: FLORINEF Take 1 tablet (0.1 mg total) by mouth daily for 14 days, THEN 1 tablet (0.1 mg total) every other day as needed for up to 14 days (For low blood pressure and dizziness). Start taking on: July 06, 2022    Fluocinolone Acetonide Body 0.01 % Oil Apply to scalp 1-2 times a day and leave on.    fluocinonide ointment 0.05 % Commonly known  as: LIDEX Apply 1 application topically 2 (two) times daily as needed (pruritus). (apply to legs)    gabapentin 400 MG capsule Commonly known as: NEURONTIN Take 400 mg by mouth at bedtime. (2000)    gabapentin 100 MG capsule Commonly known as: NEURONTIN Take 100 mg by mouth daily.    guaiFENesin 100 MG/5ML liquid Commonly known as: ROBITUSSIN Take 300 mg by mouth every 6 (six) hours as needed for cough.    ketoconazole 2 % shampoo Commonly known as: NIZORAL Massage into scalp daily and let sit several minutes before rinsing.    lactulose 10 GM/15ML solution Commonly known as: CHRONULAC Take 30 mLs (20 g total) by mouth daily as needed for mild constipation.    levothyroxine 50 MCG tablet Commonly known as: SYNTHROID Take 50 mcg by mouth daily before breakfast. (0630)    loperamide 2 MG  capsule Commonly known as: IMODIUM Take 4 mg by mouth as needed for diarrhea or loose stools.    magnesium hydroxide 400 MG/5ML suspension Commonly known as: MILK OF MAGNESIA Take 30 mLs by mouth daily as needed (constipation (no bowel movement for 3 days)).    meloxicam 15 MG tablet Commonly known as: MOBIC Take 15 mg by mouth daily.    mirabegron ER 50 MG Tb24 tablet Commonly known as: MYRBETRIQ Take 50 mg by mouth daily. (0900)    montelukast 5 MG chewable tablet Commonly known as: SINGULAIR Chew 5 mg by mouth at bedtime. (2100)    nortriptyline 75 MG capsule Commonly known as: PAMELOR Take 150 mg by mouth daily. (0900)    omeprazole 40 MG capsule Commonly known as: PRILOSEC Take 1 capsule (40 mg total) by mouth daily.    ondansetron 4 MG disintegrating tablet Commonly known as: ZOFRAN-ODT Take 1 tablet (4 mg total) by mouth every 8 (eight) hours as needed for nausea or vomiting.    paliperidone 3 MG 24 hr tablet Commonly known as: INVEGA Take 3 mg by mouth at bedtime.    polyethylene glycol 17 g packet Commonly known as: MIRALAX / GLYCOLAX Take 17 g by mouth daily as  needed (constipation.).    tamsulosin 0.4 MG Caps capsule Commonly known as: FLOMAX Take 0.4 mg by mouth at bedtime. (2100)    tiZANidine 2 MG tablet Commonly known as: ZANAFLEX Take 2 mg by mouth at bedtime. (2000)    valproic acid 250 MG capsule Commonly known as: DEPAKENE Take 250 mg by mouth 2 (two) times daily.    white petrolatum Gel Commonly known as: VASELINE Apply 1 application topically as directed. Apply vaseline to skin lesions and cover with band-aid until healed     Relevant Imaging Results:  Relevant Lab Results:   Additional Information    Beverly Sessions, RN

## 2022-07-06 NOTE — Progress Notes (Signed)
PT Cancellation Note  Patient Details Name: Dakota Harper MRN: [id number] DOB: 1946/10/25   Cancelled Treatment:    Reason Eval/Treat Not Completed: Other (comment);Fatigue/lethargy limiting ability to participate (Chart reviewed, treatment attempted. Pt appears anxious, reports to feel the effects of being sleep deprived and is having a bad dizziness day starting with mobility to BR.) Per tele monitor, HR in 110s bpm which is higher than any of his orthostatic BP assessments previous day. Will defer treatment to later date/time once pt is feeling more collected and confident.   10:51 AM, 07/06/22 Etta Grandchild, PT, DPT Physical Therapist - [facility]  [phone] ([place])     Bajandas C 07/06/2022, 10:50 AM

## 2022-07-06 NOTE — TOC Transition Note (Signed)
Transition of Care [facility]) - CM/SW Discharge Note   Patient Details  Name: Dakota Harper MRN: [id number] Date of Birth: [birth year]  [facility]) CM/SW Contact:  Beverly Sessions, RN Phone Number: 07/06/2022, 12:06 PM   Clinical Narrative:    Patient to dc today [facility] to arranged transport time with the bedside RN  Bedside RN to call report Meg with [facility] notified of discharge DC summary faxed to endocrinology DC summary and fl2 secure emailed to dustin at the [facility]      Barriers to Discharge: Continued Medical Work up   Patient Goals and CMS Choice Patient states their goals for this hospitalization and ongoing recovery are:: to go back to [facility] at discharge      Discharge Placement                       Discharge Plan and Services   Discharge Planning Services: CM Consult            DME Arranged: N/A DME Agency: NA                  Social Determinants of Health (SDOH) Interventions     Readmission Risk Interventions     No data to display

## 2022-07-06 NOTE — Discharge Summary (Signed)
Physician Discharge Summary  Dakota Harper SWN:[id number] DOB: Jan 06, 1946 DOA: 07/04/2022  PCP: [name], NP  Admit date: 07/04/2022 Discharge date: 07/06/2022  Admitted From: Home Disposition:  Home  Recommendations for Outpatient Follow-up:  Follow up with PCP in 1-2 weeks Please obtain BMP/CBC in one week your next doctors visit.  Follow up outpatient Endocrinology, Dr Lavone Orn in 3-4 weeks. Will fax discharge summary.   Florinef 0.'1mg'$  daily x 2 weeks followed by every other day as needed for dizziness and low BP.  Stop Losartan for now  Discharge Condition: Stable CODE STATUS: Full  Diet recommendation: Regalar  Brief/Interim Summary: 76 y.o. male with medical history significant of  severe insomnia, anxiety, hypertension, COPD, hypothyroidism, GERD, BPH, CKDIIIa who has interim history of hospitalization 8/2-8/7 with diagnosis of orthostatic hypotension with concern for probable adrenal insufficiency on discharge patient was started on prednisone '20mg'$  in am and '10mg'$  in afternoon, patient at that time was also offered TED hose but refused.  Cardiology did not recommend any further work-up at that time but after going home started having syncopal episodes 24 hours prior to the hospitalization. The following day patient was doing well. ACTH stim test was overall normal but slightly lower response than expected. Patient was on prednisone recently. I spoke with Dr Gabriel Carina from endocrine who recommended Florinef as mentioned above and outpatient follow up.  Stable for dc today      Assessment & Plan:  Principal Problem:   Orthostatic hypotension     Orthostasis/presyncope - Unclear etiology.  Has not followed up with endocrine yet.  We will hold off on any antihypertensives.  Also discontinue Flomax for now.  EKG does not show any acute changes.  TSH from 2 weeks ago was overall normal.  We can start midodrine if necessary, in the meantime I ordered TED hose which patient  is not too happy about using it.  ACTH stim test showed slightly lower response than expected. Dr Gabriel Carina recommended Florinef 0.'1mg'$  daily x 2 weeks followed by every other day as needed for dizziness and low BP. Outpatient follow up with her in 3-4 weeks.    AKI  on CKDIIIa -Baseline creatinine 1.5, peaked at 2.0.  Resolved.    Chronic debility PT/OT HH   HTN -Losartan stopped due to intermittent hypotension   BPH Resume home meds, follow up outptn with Urology.    Hypothyroidism -continue synthroid    Gout  -no acute flare     Psych d/o nos -Schizoaffective d/o bipolar d/o -continue invega , depakene     Assessment and Plan: No notes have been filed under this hospital service. Service: Hospitalist      Body mass index is 31.17 kg/m.       Discharge Diagnoses:  Principal Problem:   Orthostatic hypotension      Consultations: Cursided Dr Gabriel Carina from Endocrine  Subjective: Feels well and wants to go home.   Discharge Exam: Vitals:   07/06/22 0902 07/06/22 0903  BP: (!) 157/89 (!) 157/89  Pulse: (!) 104 (!) 104  Resp: 18 18  Temp: 98.2 F (36.8 C) 98.2 F (36.8 C)  SpO2: 97% 99%   Vitals:   07/05/22 1938 07/06/22 0359 07/06/22 0902 07/06/22 0903  BP: (!) 147/68 (!) 137/94 (!) 157/89 (!) 157/89  Pulse: 86 92 (!) 104 (!) 104  Resp: '20 20 18 18  '$ Temp: (!) 97.5 F (36.4 C) 98.3 F (36.8 C) 98.2 F (36.8 C) 98.2 F (36.8 C)  TempSrc: Oral  Oral Oral Oral  SpO2: 100% 100% 97% 99%  Weight:      Height:        General: Pt is alert, awake, not in acute distress Cardiovascular: RRR, S1/S2 +, no rubs, no gallops Respiratory: CTA bilaterally, no wheezing, no rhonchi Abdominal: Soft, NT, ND, bowel sounds + Extremities: no edema, no cyanosis  Discharge Instructions  Discharge Instructions     Face-to-face encounter (required for Medicare/Medicaid patients)   Complete by: As directed    I Mande Auvil Chirag Aurelio Mccamy certify that this patient is under my  care and that I, or a nurse practitioner or physician's assistant working with me, had a face-to-face encounter that meets the physician face-to-face encounter requirements with this patient on 07/06/2022. The encounter with the patient was in whole, or in part for the following medical condition(s) which is the primary reason for home health care  weakness   The encounter with the patient was in whole, or in part, for the following medical condition, which is the primary reason for home health care: weakness   I certify that, based on my findings, the following services are medically necessary home health services: Physical therapy   Reason for Medically Necessary Home Health Services: Therapy- Therapeutic Exercises to Increase Strength and Endurance   My clinical findings support the need for the above services: Unsafe ambulation due to balance issues   Further, I certify that my clinical findings support that this patient is homebound due to: Ambulates short distances less than 300 feet   Home Health   Complete by: As directed    To provide the following care/treatments:  PT OT        Allergies as of 07/06/2022       Reactions   Saw Palmetto Hives, Rash   "Berry form"   Other Other (See Comments), Rash   Sulfa Antibiotics Rash   Sulfacetamide Sodium Rash        Medication List     STOP taking these medications    colchicine 0.6 MG tablet   losartan 25 MG tablet Commonly known as: COZAAR   predniSONE 10 MG tablet Commonly known as: DELTASONE       TAKE these medications    acetaminophen 500 MG tablet Commonly known as: TYLENOL Take 500 mg by mouth every 4 (four) hours as needed for moderate pain or mild pain.   albuterol 108 (90 Base) MCG/ACT inhaler Commonly known as: VENTOLIN HFA Inhale 1 puff into the lungs 3 (three) times daily as needed for wheezing or shortness of breath.   alum & mag hydroxide-simeth 200-200-20 MG/5ML suspension Commonly known as:  MAALOX/MYLANTA Take 30 mLs by mouth 4 (four) times daily as needed for indigestion or heartburn.   aspirin EC 81 MG tablet Take 81 mg by mouth at bedtime. (2100)   atorvastatin 10 MG tablet Commonly known as: LIPITOR Take 10 mg by mouth every evening. (2000)   Breo Ellipta 200-25 MCG/ACT Aepb Generic drug: fluticasone furoate-vilanterol Inhale 1 puff into the lungs daily. (0800)   chlorhexidine 0.12 % solution Commonly known as: PERIDEX Use as directed 15 mLs in the mouth or throat 2 (two) times daily as needed (oral discomfort).   cycloSPORINE 0.05 % ophthalmic emulsion Commonly known as: RESTASIS Place 1 drop into both eyes 2 (two) times daily as needed (dry eyes).   diclofenac Sodium 1 % Gel Commonly known as: VOLTAREN Apply 2 g topically as needed (lower left leg pain.).   diphenhydrAMINE 25 MG tablet  Commonly known as: BENADRYL Take 25 mg by mouth every 6 (six) hours as needed (allergic reaction).   Ear Wax Removal Drops 6.5 % OTIC solution Generic drug: carbamide peroxide Place 5 drops into both ears every Sunday.   eucerin cream Apply 1 application topically in the morning and at bedtime. (0800 & 2000)   fenofibrate 54 MG tablet Take 1 tablet (54 mg total) by mouth daily.   finasteride 5 MG tablet Commonly known as: PROSCAR Take 1 tablet (5 mg total) by mouth daily.   fludrocortisone 0.1 MG tablet Commonly known as: FLORINEF Take 1 tablet (0.1 mg total) by mouth daily for 14 days, THEN 1 tablet (0.1 mg total) every other day as needed for up to 14 days (For low blood pressure and dizziness). Start taking on: July 06, 2022   Fluocinolone Acetonide Body 0.01 % Oil Apply to scalp 1-2 times a day and leave on.   fluocinonide ointment 0.05 % Commonly known as: LIDEX Apply 1 application topically 2 (two) times daily as needed (pruritus). (apply to legs)   gabapentin 400 MG capsule Commonly known as: NEURONTIN Take 400 mg by mouth at bedtime. (2000)    gabapentin 100 MG capsule Commonly known as: NEURONTIN Take 100 mg by mouth daily.   guaiFENesin 100 MG/5ML liquid Commonly known as: ROBITUSSIN Take 300 mg by mouth every 6 (six) hours as needed for cough.   ketoconazole 2 % shampoo Commonly known as: NIZORAL Massage into scalp daily and let sit several minutes before rinsing.   lactulose 10 GM/15ML solution Commonly known as: CHRONULAC Take 30 mLs (20 g total) by mouth daily as needed for mild constipation.   levothyroxine 50 MCG tablet Commonly known as: SYNTHROID Take 50 mcg by mouth daily before breakfast. (0630)   loperamide 2 MG capsule Commonly known as: IMODIUM Take 4 mg by mouth as needed for diarrhea or loose stools.   magnesium hydroxide 400 MG/5ML suspension Commonly known as: MILK OF MAGNESIA Take 30 mLs by mouth daily as needed (constipation (no bowel movement for 3 days)).   meloxicam 15 MG tablet Commonly known as: MOBIC Take 15 mg by mouth daily.   mirabegron ER 50 MG Tb24 tablet Commonly known as: MYRBETRIQ Take 50 mg by mouth daily. (0900)   montelukast 5 MG chewable tablet Commonly known as: SINGULAIR Chew 5 mg by mouth at bedtime. (2100)   nortriptyline 75 MG capsule Commonly known as: PAMELOR Take 150 mg by mouth daily. (0900)   omeprazole 40 MG capsule Commonly known as: PRILOSEC Take 1 capsule (40 mg total) by mouth daily.   ondansetron 4 MG disintegrating tablet Commonly known as: ZOFRAN-ODT Take 1 tablet (4 mg total) by mouth every 8 (eight) hours as needed for nausea or vomiting.   paliperidone 3 MG 24 hr tablet Commonly known as: INVEGA Take 3 mg by mouth at bedtime.   polyethylene glycol 17 g packet Commonly known as: MIRALAX / GLYCOLAX Take 17 g by mouth daily as needed (constipation.).   tamsulosin 0.4 MG Caps capsule Commonly known as: FLOMAX Take 0.4 mg by mouth at bedtime. (2100)   tiZANidine 2 MG tablet Commonly known as: ZANAFLEX Take 2 mg by mouth at bedtime.  (2000)   valproic acid 250 MG capsule Commonly known as: DEPAKENE Take 250 mg by mouth 2 (two) times daily.   white petrolatum Gel Commonly known as: VASELINE Apply 1 application topically as directed. Apply vaseline to skin lesions and cover with band-aid until healed  Follow-up Information     [name], NP Follow up in 1 week(s).   Specialty: Nurse Practitioner Contact information: [address]. [address] [place]  [place] [address] [phone]                Allergies  Allergen Reactions   Saw Palmetto Hives and Rash    "Berry form"   Other Other (See Comments) and Rash   Sulfa Antibiotics Rash   Sulfacetamide Sodium Rash    You were cared for by a hospitalist during your hospital stay. If you have any questions about your discharge medications or the care you received while you were in the hospital after you are discharged, you can call the unit and asked to speak with the hospitalist on call if the hospitalist that took care of you is not available. Once you are discharged, your primary care physician will handle any further medical issues. Please note that no refills for any discharge medications will be authorized once you are discharged, as it is imperative that you return to your primary care physician (or establish a relationship with a primary care physician if you do not have one) for your aftercare needs so that they can reassess your need for medications and monitor your lab values.   Procedures/Studies: ECHOCARDIOGRAM COMPLETE  Result Date: 07/05/2022    ECHOCARDIOGRAM REPORT   Patient Name:   Dakota Harper Date of Exam: 07/05/2022 Medical Rec #:  [id number]        Height:       68.0 in Accession #:    [id number]       Weight:       205.0 lb Date of Birth:  [birth year]       BSA:          2.065 m Patient Age:    72 years         BP:           153/76 mmHg Patient Gender: M                HR:           87 bpm. Exam Location:  [facility] Procedure:  2D Echo, Color Doppler and Cardiac Doppler Indications:     Syncope R55  History:         Patient has no prior history of Echocardiogram examinations.                  COPD; Risk Factors:Hypertension.  Sonographer:     Sherrie Sport Referring Phys:  [id number] Jeanella Flattery Aadin Gaut Diagnosing Phys: Kate Sable MD  Sonographer Comments: Suboptimal apical window. IMPRESSIONS  1. Left ventricular ejection fraction, by estimation, is 55 to 60%. The left ventricle has normal function. The left ventricle has no regional wall motion abnormalities. There is mild left ventricular hypertrophy. Left ventricular diastolic parameters are consistent with Grade I diastolic dysfunction (impaired relaxation).  2. Right ventricular systolic function is normal. The right ventricular size is not well visualized.  3. The mitral valve is normal in structure. No evidence of mitral valve regurgitation.  4. The aortic valve was not well visualized. Aortic valve regurgitation is not visualized. FINDINGS  Left Ventricle: Left ventricular ejection fraction, by estimation, is 55 to 60%. The left ventricle has normal function. The left ventricle has no regional wall motion abnormalities. The left ventricular internal cavity size was normal in size. There is  mild left ventricular hypertrophy. Left ventricular diastolic parameters are consistent with Grade  I diastolic dysfunction (impaired relaxation). Right Ventricle: The right ventricular size is not well visualized. No increase in right ventricular wall thickness. Right ventricular systolic function is normal. Left Atrium: Left atrial size was normal in size. Right Atrium: Right atrial size was normal in size. Pericardium: There is no evidence of pericardial effusion. Mitral Valve: The mitral valve is normal in structure. No evidence of mitral valve regurgitation. Tricuspid Valve: The tricuspid valve is normal in structure. Tricuspid valve regurgitation is not demonstrated. Aortic Valve: The  aortic valve was not well visualized. Aortic valve regurgitation is not visualized. Aortic valve mean gradient measures 2.5 mmHg. Aortic valve peak gradient measures 4.4 mmHg. Aortic valve area, by VTI measures 3.13 cm. Pulmonic Valve: The pulmonic valve was not well visualized. Pulmonic valve regurgitation is not visualized. Aorta: The aortic root is normal in size and structure. Venous: The inferior vena cava was not well visualized. IAS/Shunts: No atrial level shunt detected by color flow Doppler.  LEFT VENTRICLE PLAX 2D LVIDd:         4.70 cm   Diastology LVIDs:         3.30 cm   LV e' medial:    7.72 cm/s LV PW:         1.00 cm   LV E/e' medial:  10.9 LV IVS:        1.00 cm   LV e' lateral:   8.05 cm/s LVOT diam:     2.00 cm   LV E/e' lateral: 10.5 LV SV:         63 LV SV Index:   31 LVOT Area:     3.14 cm  RIGHT VENTRICLE RV S prime:     15.20 cm/s TAPSE (M-mode): 1.8 cm LEFT ATRIUM             Index        RIGHT ATRIUM           Index LA diam:        4.10 cm 1.99 cm/m   RA Area:     18.40 cm LA Vol (A2C):   58.8 ml 28.47 ml/m  RA Volume:   45.80 ml  22.18 ml/m LA Vol (A4C):   47.6 ml 23.05 ml/m LA Biplane Vol: 52.6 ml 25.47 ml/m  AORTIC VALVE AV Area (Vmax):    3.29 cm AV Area (Vmean):   2.98 cm AV Area (VTI):     3.13 cm AV Vmax:           105.00 cm/s AV Vmean:          74.850 cm/s AV VTI:            0.202 m AV Peak Grad:      4.4 mmHg AV Mean Grad:      2.5 mmHg LVOT Vmax:         110.00 cm/s LVOT Vmean:        70.900 cm/s LVOT VTI:          0.201 m LVOT/AV VTI ratio: 1.00  AORTA Ao Root diam: 3.20 cm MITRAL VALVE MV Area (PHT): 6.17 cm     SHUNTS MV Decel Time: 123 msec     Systemic VTI:  0.20 m MV E velocity: 84.40 cm/s   Systemic Diam: 2.00 cm MV A velocity: 120.00 cm/s MV E/A ratio:  0.70 Kate Sable MD Electronically signed by Kate Sable MD Signature Date/Time: 07/05/2022/4:13:15 PM    Final    DG Chest Port 1  View  Result Date: 07/04/2022 CLINICAL DATA:  Weakness. EXAM:  PORTABLE CHEST 1 VIEW COMPARISON:  Chest x-ray 06/07/2022 FINDINGS: There is blunting of the left costophrenic angle with atelectasis in the left lung base. Cardiomediastinal silhouette is within normal limits. No pneumothorax or acute fracture. IMPRESSION: 1. Small left pleural effusion with left basilar atelectasis. Electronically Signed   By: Ronney Asters M.D.   On: 07/04/2022 19:20   CT ABDOMEN PELVIS WO CONTRAST  Result Date: 06/08/2022 CLINICAL DATA:  Nausea and vomiting.  Low back pain. EXAM: CT ABDOMEN AND PELVIS WITHOUT CONTRAST TECHNIQUE: Multidetector CT imaging of the abdomen and pelvis was performed following the standard protocol without IV contrast. RADIATION DOSE REDUCTION: This exam was performed according to the departmental dose-optimization program which includes automated exposure control, adjustment of the mA and/or kV according to patient size and/or use of iterative reconstruction technique. COMPARISON:  CT 04/02/2016 FINDINGS: Lower chest: Basilar scarring which was characterized on chest CT 3 hours ago. Calcified granuloma in the right lower lobe. Hepatobiliary: No focal hepatic abnormality on this unenhanced exam. Cholecystectomy. No biliary dilatation. Pancreas: Fatty atrophy.  No ductal dilatation or inflammation. Spleen: Normal in size without focal abnormality. Adrenals/Urinary Tract: No adrenal nodule. Renal parenchymal atrophy with symmetric perinephric edema. No hydronephrosis. No renal calculi. Moderate urinary bladder distention without wall thickening or inflammation. Stomach/Bowel: The stomach is distended with fluid. No abnormal gastric wall thickening. No small bowel obstruction or inflammation. Diminutive appendix is tentatively visualized. Focal calcification at the distal appendix is chronic and may represent an appendicolith or dystrophic calcification. Regardless no appendicitis. Moderate volume of stool throughout the colon. No colonic wall thickening or inflammatory  change. Vascular/Lymphatic: Moderate aortic atherosclerosis. No aneurysm. No bulky abdominopelvic adenopathy. Reproductive: Prostate gland is enlarged causing mass effect on the bladder base. Prostate spans 5.8 cm AP. Other: No free air, free fluid, or intra-abdominal fluid collection. Minimal fat in the inguinal canals. Musculoskeletal: Prominent degenerative disc disease throughout the lumbar spine. There are no acute or suspicious osseous abnormalities. IMPRESSION: 1. Fluid distended stomach, may represent recent p.o. intake. The possibility of gastroparesis is also considered. 2. Enlarged prostate gland causing mass effect on the bladder base. Moderate urinary bladder distention without wall thickening or inflammation. 3. Moderate colonic stool burden, can be seen with constipation. Aortic Atherosclerosis (ICD10-I70.0). Electronically Signed   By: Keith Rake M.D.   On: 06/08/2022 01:43   CT Chest Wo Contrast  Result Date: 06/07/2022 CLINICAL DATA:  Respiratory difficulty EXAM: CT CHEST WITHOUT CONTRAST TECHNIQUE: Multidetector CT imaging of the chest was performed following the standard protocol without IV contrast. RADIATION DOSE REDUCTION: This exam was performed according to the departmental dose-optimization program which includes automated exposure control, adjustment of the mA and/or kV according to patient size and/or use of iterative reconstruction technique. COMPARISON:  Chest x-ray from earlier in the same day. FINDINGS: Cardiovascular: Limited due to lack of IV contrast. Aberrant right subclavian artery is noted. Atherosclerotic calcifications of the aorta and its branches are seen. No aneurysmal dilatation is noted. No cardiac enlargement is seen. Scattered coronary calcifications are noted. Mediastinum/Nodes: Thoracic inlet is within normal limits. No hilar or mediastinal adenopathy is noted. The esophagus is within normal limits. Lungs/Pleura: Lungs are well aerated bilaterally. Mild  dependent scarring is noted bilaterally. No focal confluent infiltrate or sizable parenchymal nodules are seen. No effusion is noted. Upper Abdomen: Visualized upper abdomen shows no acute abnormality. Musculoskeletal: Degenerative changes of the thoracic spine are seen. No acute rib  abnormality is noted. IMPRESSION: Bilateral parenchymal scarring without acute infiltrate. Aberrant right subclavian artery. Aortic Atherosclerosis (ICD10-I70.0). Electronically Signed   By: Inez Catalina M.D.   On: 06/07/2022 22:09   CT HEAD WO CONTRAST (5MM)  Result Date: 06/07/2022 CLINICAL DATA:  Fall yesterday with increased weakness. History of stroke. EXAM: CT HEAD WITHOUT CONTRAST TECHNIQUE: Contiguous axial images were obtained from the base of the skull through the vertex without intravenous contrast. RADIATION DOSE REDUCTION: This exam was performed according to the departmental dose-optimization program which includes automated exposure control, adjustment of the mA and/or kV according to patient size and/or use of iterative reconstruction technique. COMPARISON:  None Available. FINDINGS: Brain: There is no acute intracranial hemorrhage, extra-axial fluid collection, or acute infarct Parenchymal volume is normal. The ventricles are normal in size. Gray-white differentiation is preserved. There is no mass lesion.  There is no mass effect or midline shift. Vascular: There is calcification of the bilateral cavernous ICAs. Skull: Normal. Negative for fracture or focal lesion. Sinuses/Orbits: Postsurgical changes are noted in the sinonasal cavity with mild mucosal thickening in the postsurgical sphenoid sinus. The globes and orbits are unremarkable. Other: None. IMPRESSION: No acute intracranial pathology. Electronically Signed   By: Valetta Mole M.D.   On: 06/07/2022 18:31   DG Chest 2 View  Result Date: 06/07/2022 CLINICAL DATA:  Chest pain, weakness, and dehydration. EXAM: CHEST - 2 VIEW COMPARISON:  10/27/2020 FINDINGS:  Shallow inspiration. Linear atelectasis or scarring in the lung bases. Heart size and pulmonary vascularity are normal. Previous infiltrates have improved. No developing consolidation or edema. No pleural effusions. No pneumothorax. Mediastinal contours appear intact. Calcification of the aorta. Degenerative changes in the spine and shoulders. IMPRESSION: Shallow inspiration with linear fibrosis or atelectasis in the lung bases. No focal consolidation or edema. Electronically Signed   By: Lucienne Capers M.D.   On: 06/07/2022 18:28     The results of significant diagnostics from this hospitalization (including imaging, microbiology, ancillary and laboratory) are listed below for reference.     Microbiology: No results found for this or any previous visit (from the past 240 hour(s)).   Labs: BNP (last 3 results) No results for input(s): "BNP" in the last 8760 hours. Basic Metabolic Panel: Recent Labs  Lab 07/04/22 1731 07/05/22 0341 07/06/22 0734  NA 134* 135 139  K 4.5 4.4 4.4  CL 97* 102 108  CO2 '27 28 24  '$ GLUCOSE 122* 110* 141*  BUN 40* 38* 29*  CREATININE 2.00* 1.71* 1.48*  CALCIUM 9.1 8.6* 8.9  MG  --   --  2.2   Liver Function Tests: Recent Labs  Lab 07/05/22 0341  AST 15  ALT 18  ALKPHOS 47  BILITOT 0.5  PROT 5.6*  ALBUMIN 3.1*   No results for input(s): "LIPASE", "AMYLASE" in the last 168 hours. No results for input(s): "AMMONIA" in the last 168 hours. CBC: Recent Labs  Lab 07/04/22 1731 07/05/22 0341 07/06/22 0734  WBC 11.7* 8.6 7.0  HGB 14.2 13.5 13.8  HCT 43.4 40.7 41.9  MCV 95.8 94.0 92.5  PLT 202 150 164   Cardiac Enzymes: No results for input(s): "CKTOTAL", "CKMB", "CKMBINDEX", "TROPONINI" in the last 168 hours. BNP: Invalid input(s): "POCBNP" CBG: No results for input(s): "GLUCAP" in the last 168 hours. D-Dimer No results for input(s): "DDIMER" in the last 72 hours. Hgb A1c No results for input(s): "HGBA1C" in the last 72 hours. Lipid  Profile No results for input(s): "CHOL", "HDL", "LDLCALC", "TRIG", "CHOLHDL", "LDLDIRECT"  in the last 72 hours. Thyroid function studies Recent Labs    07/04/22 2033  TSH 2.631   Anemia work up No results for input(s): "VITAMINB12", "FOLATE", "FERRITIN", "TIBC", "IRON", "RETICCTPCT" in the last 72 hours. Urinalysis    Component Value Date/Time   COLORURINE YELLOW (A) 07/04/2022 2206   APPEARANCEUR HAZY (A) 07/04/2022 2206   APPEARANCEUR Clear 09/17/2017 0905   LABSPEC 1.010 07/04/2022 2206   PHURINE 5.0 07/04/2022 2206   GLUCOSEU NEGATIVE 07/04/2022 2206   HGBUR NEGATIVE 07/04/2022 2206   [place] 07/04/2022 2206   BILIRUBINUR Negative 09/17/2017 0905   KETONESUR NEGATIVE 07/04/2022 2206   PROTEINUR NEGATIVE 07/04/2022 2206   NITRITE NEGATIVE 07/04/2022 2206   LEUKOCYTESUR NEGATIVE 07/04/2022 2206   Sepsis Labs Recent Labs  Lab 07/04/22 1731 07/05/22 0341 07/06/22 0734  WBC 11.7* 8.6 7.0   Microbiology No results found for this or any previous visit (from the past 240 hour(s)).   Time coordinating discharge:  I have spent 35 minutes face to face with the patient and on the ward discussing the patients care, assessment, plan and disposition with other care givers. >50% of the time was devoted counseling the patient about the risks and benefits of treatment/Discharge disposition and coordinating care.   SIGNED:   Damita Lack, MD  [facility] 07/06/2022, 11:50 AM   If 7PM-7AM, please contact night-coverage

## 2022-07-06 NOTE — Progress Notes (Signed)
Report given to the [place] at [facility].

## 2022-07-08 ENCOUNTER — Other Ambulatory Visit: Payer: Self-pay

## 2022-07-08 ENCOUNTER — Encounter: Payer: Self-pay | Admitting: Emergency Medicine

## 2022-07-08 ENCOUNTER — Emergency Department: Payer: 59

## 2022-07-08 ENCOUNTER — Emergency Department
Admission: EM | Admit: 2022-07-08 | Discharge: 2022-07-09 | Disposition: A | Discharge status: Home / Self Care | Payer: 59 | Source: Home / Self Care | Attending: Emergency Medicine | Admitting: Emergency Medicine

## 2022-07-08 DIAGNOSIS — E860 Dehydration: Secondary | ICD-10-CM | POA: Insufficient documentation

## 2022-07-08 DIAGNOSIS — R42 Dizziness and giddiness: Secondary | ICD-10-CM | POA: Insufficient documentation

## 2022-07-08 DIAGNOSIS — J449 Chronic obstructive pulmonary disease, unspecified: Secondary | ICD-10-CM | POA: Insufficient documentation

## 2022-07-08 DIAGNOSIS — R531 Weakness: Secondary | ICD-10-CM | POA: Insufficient documentation

## 2022-07-08 DIAGNOSIS — Z20822 Contact with and (suspected) exposure to covid-19: Secondary | ICD-10-CM | POA: Insufficient documentation

## 2022-07-08 DIAGNOSIS — I10 Essential (primary) hypertension: Secondary | ICD-10-CM | POA: Insufficient documentation

## 2022-07-08 DIAGNOSIS — I959 Hypotension, unspecified: Secondary | ICD-10-CM | POA: Insufficient documentation

## 2022-07-08 DIAGNOSIS — N179 Acute kidney failure, unspecified: Secondary | ICD-10-CM | POA: Diagnosis not present

## 2022-07-08 LAB — BASIC METABOLIC PANEL
Anion gap: 8 (ref 5–15)
Anion gap: 9 (ref 5–15)
BUN: 40 mg/dL — ABNORMAL HIGH (ref 8–23)
BUN: 40 mg/dL — ABNORMAL HIGH (ref 8–23)
CO2: 22 mmol/L (ref 22–32)
CO2: 19 mmol/L — ABNORMAL LOW (ref 22–32)
Calcium: 8.3 mg/dL — ABNORMAL LOW (ref 8.9–10.3)
Calcium: 7.8 mg/dL — ABNORMAL LOW (ref 8.9–10.3)
Chloride: 104 mmol/L (ref 98–111)
Chloride: 104 mmol/L (ref 98–111)
Creatinine, Ser: 2.93 mg/dL — ABNORMAL HIGH (ref 0.61–1.24)
Creatinine, Ser: 2.84 mg/dL — ABNORMAL HIGH (ref 0.61–1.24)
GFR, Estimated: 21 mL/min — ABNORMAL LOW (ref >60)
GFR, Estimated: 22 mL/min — ABNORMAL LOW (ref >60)
Glucose, Bld: 110 mg/dL — ABNORMAL HIGH (ref 70–99)
Glucose, Bld: 119 mg/dL — ABNORMAL HIGH (ref 70–99)
Potassium: 4.6 mmol/L (ref 3.5–5.1)
Potassium: 4.6 mmol/L (ref 3.5–5.1)
Sodium: 134 mmol/L — ABNORMAL LOW (ref 135–145)
Sodium: 132 mmol/L — ABNORMAL LOW (ref 135–145)

## 2022-07-08 LAB — CBC
HCT: 37.4 % — ABNORMAL LOW (ref 39.0–52.0)
Hemoglobin: 12.2 g/dL — ABNORMAL LOW (ref 13.0–17.0)
MCH: 30.7 pg (ref 26.0–34.0)
MCHC: 32.6 g/dL (ref 30.0–36.0)
MCV: 94.2 fL (ref 80.0–100.0)
Platelets: 147 10*3/uL — ABNORMAL LOW (ref 150–400)
RBC: 3.97 MIL/uL — ABNORMAL LOW (ref 4.22–5.81)
RDW: 14.3 % (ref 11.5–15.5)
WBC: 6.7 10*3/uL (ref 4.0–10.5)
nRBC: 0 % (ref 0.0–0.2)

## 2022-07-08 LAB — URINALYSIS, ROUTINE W REFLEX MICROSCOPIC
Glucose, UA: NEGATIVE mg/dL
Nitrite: NEGATIVE
Protein, ur: 300 mg/dL — AB
Specific Gravity, Urine: 1.015 (ref 1.005–1.030)
pH: 6.5 (ref 5.0–8.0)

## 2022-07-08 LAB — SARS CORONAVIRUS 2 BY RT PCR: SARS Coronavirus 2 by RT PCR: NEGATIVE

## 2022-07-08 LAB — TROPONIN I (HIGH SENSITIVITY): Troponin I (High Sensitivity): 7 ng/L (ref <18)

## 2022-07-08 MED ORDER — SODIUM CHLORIDE 0.9 % IV BOLUS
500.0000 mL | Freq: Once | INTRAVENOUS | Status: AC
  Administered 2022-07-08: 500 mL via INTRAVENOUS

## 2022-07-08 NOTE — ED Notes (Signed)
[facility] called for transport to [facility]

## 2022-07-08 NOTE — ED Provider Notes (Addendum)
[facility] Provider Note    Event Date/Time   First MD Initiated Contact with Patient 07/08/22 1650     (approximate)  History   Chief Complaint: Low blood pressure HPI  Dakota Harper is a 76 y.o. male with a past medical history of anxiety, arthritis, COPD, depression, hypertension, hyperlipidemia, presents emergency department for low blood pressure.  According to the patient yesterday he had a fall in which he hit his head but did not tell anyone per patient.  Today he states he has been feeling somewhat more weak and somewhat lightheaded.  Patient was recently admitted to the hospital beginning of August initially found to have low cortisol level started on prednisone.  Patient was readmitted to the hospital this week was changed to a different steroid and discharged to his nursing facility.  EMS states initially the patient had a lower blood pressure around 28-00 systolic was given 349 cc of fluid on arrival patient's blood pressure is around 179-150 systolic.  Patient states he is feeling better is asking for a dinner tray.  Denies any recent fever cough congestion denies any abdominal pain or dysuria.  No chest pain.  Physical Exam   Triage Vital Signs: ED Triage Vitals  Enc Vitals Group     BP 07/08/22 1628 (!) 120/54     Pulse Rate 07/08/22 1628 78     Resp 07/08/22 1628 16     Temp 07/08/22 1638 (!) 97.4 F (36.3 C)     Temp Source 07/08/22 1638 Axillary     SpO2 07/08/22 1628 98 %     Weight --      Height --      Head Circumference --      Peak Flow --      Pain Score 07/08/22 1633 7     Pain Loc --      Pain Edu? --      Excl. in [place]? --     Most recent vital signs: Vitals:   07/08/22 1638 07/08/22 1700  BP:  106/60  Pulse:  78  Resp:  18  Temp: (!) 97.4 F (36.3 C)   SpO2:  100%    General: Awake, no distress.  CV:  Good peripheral perfusion.  Regular rate and rhythm  Resp:  Normal effort.  Equal breath sounds bilaterally.   Abd:  No distention.  Soft, nontender.  No rebound or guarding.   ED Results / Procedures / Treatments   EKG  EKG viewed and interpreted by myself shows a normal sinus rhythm at 79 bpm with a widened QRS, normal axis, largely normal intervals and nonspecific ST changes.  RADIOLOGY  I have reviewed and interpreted the CT head images.  No significant bleed on my evaluation. Radiology is read the CT scan as negative.   MEDICATIONS ORDERED IN ED: Medications  sodium chloride 0.9 % bolus 500 mL (500 mLs Intravenous New Bag/Given 07/08/22 1718)     IMPRESSION / MDM / ASSESSMENT AND PLAN / ED COURSE  I reviewed the triage vital signs and the nursing notes.  Patient's presentation is most consistent with acute presentation with potential threat to life or bodily function.  Patient presents emergency department for generalized weakness initially found to be hypotensive by EMS now normotensive after 500 cc of normal saline.  We will check labs including blood work, cardiac enzyme, COVID.  Given the patient's fall with head injury yesterday we will obtain a CT scan of the head.  We will continue to closely monitor while awaiting results.  Patient agreeable to plan of care.  CT scan of the head is negative.  Patient's lab work does show an elevated creatinine 2.93.  Patient received 500 cc of fluid repeat creatinine 2.84.  I reviewed the patient's prior labs his baseline creatinine runs between 1.7 and 2.0.  Patient's blood pressure remains elevated currently 135/63.  Patient is ambulated without any difficulty has eaten and drinking.  I discussed with the patient to increase his fluid intake and have his labs rechecked on Tuesday.  Patient agreeable to plan of care.  Remainder the patient's work-up is nonrevealing, normal CBC with a normal white blood cell count, urinalysis shows no sign of urinary tract infection, troponin is normal and COVID is negative.  FINAL CLINICAL IMPRESSION(S) / ED  DIAGNOSES   Weakness Hypotension Dehydration  Note:  This document was prepared using Dragon voice recognition software and may include unintentional dictation errors.   Harvest Dark, MD 07/08/22 2132    Harvest Dark, MD 07/08/22 2133

## 2022-07-08 NOTE — ED Triage Notes (Signed)
Pt to ED via [facility] from [facility] for hypotension. Pt admitted last week for same and it was thought be to caused by low cortisol level. Pt was started on new medication and discharged on 8/31. Pt still c/o dizziness and hypotension. Pt had a fall last night due to dizziness. Pt did hit his head. Pt denies LOC or use of blood thinners. Pt is currently in NAD.   Pts initial blood pressure for EMS was 90/50, pt was given a 500 cc bolus of normal saline and his blood pressure improved to 114/52.   Pts CBG was 125 with EMS and 12 lead EKG showed NSR with a RBBB at a rate of 85.  Pt denies any complaints from his fall last night. Pt is having pain in his right ankle but states that this is chronic pain.

## 2022-07-08 NOTE — Discharge Instructions (Signed)
As we discussed please increase your fluid intake over the weekend.  Follow-up with your doctor on Tuesday for recheck of your labs including your kidney function.  Return to the emergency department for any further episodes of weakness or low blood pressure, or any other symptom personally concerning to yourself.

## 2022-07-09 NOTE — ED Notes (Signed)
Pt used urinal with standby assistance from ed tech.

## 2022-07-10 ENCOUNTER — Encounter: Payer: Self-pay | Admitting: Emergency Medicine

## 2022-07-10 ENCOUNTER — Other Ambulatory Visit: Payer: Self-pay

## 2022-07-10 ENCOUNTER — Emergency Department: Payer: 59

## 2022-07-10 ENCOUNTER — Inpatient Hospital Stay
Admission: EM | Admit: 2022-07-10 | Discharge: 2022-07-13 | DRG: 683 | Disposition: A | Discharge status: Home / Self Care | Payer: 59 | Source: Skilled Nursing Facility | Attending: Internal Medicine | Admitting: Internal Medicine

## 2022-07-10 DIAGNOSIS — Z9049 Acquired absence of other specified parts of digestive tract: Secondary | ICD-10-CM

## 2022-07-10 DIAGNOSIS — D649 Anemia, unspecified: Secondary | ICD-10-CM | POA: Diagnosis present

## 2022-07-10 DIAGNOSIS — F419 Anxiety disorder, unspecified: Secondary | ICD-10-CM | POA: Diagnosis present

## 2022-07-10 DIAGNOSIS — I959 Hypotension, unspecified: Secondary | ICD-10-CM | POA: Diagnosis present

## 2022-07-10 DIAGNOSIS — E441 Mild protein-calorie malnutrition: Secondary | ICD-10-CM | POA: Diagnosis present

## 2022-07-10 DIAGNOSIS — N1832 Chronic kidney disease, stage 3b: Secondary | ICD-10-CM | POA: Diagnosis present

## 2022-07-10 DIAGNOSIS — I129 Hypertensive chronic kidney disease with stage 1 through stage 4 chronic kidney disease, or unspecified chronic kidney disease: Secondary | ICD-10-CM | POA: Diagnosis present

## 2022-07-10 DIAGNOSIS — Z820 Family history of epilepsy and other diseases of the nervous system: Secondary | ICD-10-CM

## 2022-07-10 DIAGNOSIS — D696 Thrombocytopenia, unspecified: Secondary | ICD-10-CM | POA: Diagnosis present

## 2022-07-10 DIAGNOSIS — Z6831 Body mass index (BMI) 31.0-31.9, adult: Secondary | ICD-10-CM

## 2022-07-10 DIAGNOSIS — Z888 Allergy status to other drugs, medicaments and biological substances status: Secondary | ICD-10-CM

## 2022-07-10 DIAGNOSIS — M1990 Unspecified osteoarthritis, unspecified site: Secondary | ICD-10-CM | POA: Diagnosis present

## 2022-07-10 DIAGNOSIS — Z87891 Personal history of nicotine dependence: Secondary | ICD-10-CM

## 2022-07-10 DIAGNOSIS — N401 Enlarged prostate with lower urinary tract symptoms: Secondary | ICD-10-CM | POA: Diagnosis present

## 2022-07-10 DIAGNOSIS — K5900 Constipation, unspecified: Secondary | ICD-10-CM | POA: Diagnosis present

## 2022-07-10 DIAGNOSIS — M109 Gout, unspecified: Secondary | ICD-10-CM | POA: Diagnosis present

## 2022-07-10 DIAGNOSIS — I10 Essential (primary) hypertension: Secondary | ICD-10-CM | POA: Diagnosis present

## 2022-07-10 DIAGNOSIS — R338 Other retention of urine: Secondary | ICD-10-CM

## 2022-07-10 DIAGNOSIS — N400 Enlarged prostate without lower urinary tract symptoms: Secondary | ICD-10-CM | POA: Diagnosis present

## 2022-07-10 DIAGNOSIS — E669 Obesity, unspecified: Secondary | ICD-10-CM | POA: Diagnosis present

## 2022-07-10 DIAGNOSIS — Z85828 Personal history of other malignant neoplasm of skin: Secondary | ICD-10-CM

## 2022-07-10 DIAGNOSIS — Z79899 Other long term (current) drug therapy: Secondary | ICD-10-CM

## 2022-07-10 DIAGNOSIS — Z791 Long term (current) use of non-steroidal anti-inflammatories (NSAID): Secondary | ICD-10-CM

## 2022-07-10 DIAGNOSIS — Y846 Urinary catheterization as the cause of abnormal reaction of the patient, or of later complication, without mention of misadventure at the time of the procedure: Secondary | ICD-10-CM | POA: Diagnosis not present

## 2022-07-10 DIAGNOSIS — N179 Acute kidney failure, unspecified: Secondary | ICD-10-CM | POA: Diagnosis not present

## 2022-07-10 DIAGNOSIS — J449 Chronic obstructive pulmonary disease, unspecified: Secondary | ICD-10-CM | POA: Diagnosis present

## 2022-07-10 DIAGNOSIS — Z7951 Long term (current) use of inhaled steroids: Secondary | ICD-10-CM

## 2022-07-10 DIAGNOSIS — T8383XA Hemorrhage of genitourinary prosthetic devices, implants and grafts, initial encounter: Secondary | ICD-10-CM | POA: Diagnosis not present

## 2022-07-10 DIAGNOSIS — Z8249 Family history of ischemic heart disease and other diseases of the circulatory system: Secondary | ICD-10-CM

## 2022-07-10 DIAGNOSIS — G629 Polyneuropathy, unspecified: Secondary | ICD-10-CM | POA: Diagnosis present

## 2022-07-10 DIAGNOSIS — J4489 Other specified chronic obstructive pulmonary disease: Secondary | ICD-10-CM | POA: Diagnosis present

## 2022-07-10 DIAGNOSIS — E2740 Unspecified adrenocortical insufficiency: Secondary | ICD-10-CM | POA: Diagnosis present

## 2022-07-10 DIAGNOSIS — E860 Dehydration: Secondary | ICD-10-CM | POA: Diagnosis present

## 2022-07-10 DIAGNOSIS — Z87442 Personal history of urinary calculi: Secondary | ICD-10-CM

## 2022-07-10 DIAGNOSIS — Z7982 Long term (current) use of aspirin: Secondary | ICD-10-CM

## 2022-07-10 DIAGNOSIS — E7800 Pure hypercholesterolemia, unspecified: Secondary | ICD-10-CM | POA: Diagnosis present

## 2022-07-10 DIAGNOSIS — Z8051 Family history of malignant neoplasm of kidney: Secondary | ICD-10-CM

## 2022-07-10 DIAGNOSIS — E871 Hypo-osmolality and hyponatremia: Secondary | ICD-10-CM | POA: Diagnosis present

## 2022-07-10 DIAGNOSIS — W19XXXA Unspecified fall, initial encounter: Secondary | ICD-10-CM | POA: Diagnosis present

## 2022-07-10 DIAGNOSIS — N390 Urinary tract infection, site not specified: Principal | ICD-10-CM | POA: Diagnosis present

## 2022-07-10 DIAGNOSIS — Z8616 Personal history of COVID-19: Secondary | ICD-10-CM

## 2022-07-10 DIAGNOSIS — Z20822 Contact with and (suspected) exposure to covid-19: Secondary | ICD-10-CM | POA: Diagnosis present

## 2022-07-10 DIAGNOSIS — E039 Hypothyroidism, unspecified: Secondary | ICD-10-CM | POA: Diagnosis present

## 2022-07-10 DIAGNOSIS — Z833 Family history of diabetes mellitus: Secondary | ICD-10-CM

## 2022-07-10 DIAGNOSIS — R310 Gross hematuria: Secondary | ICD-10-CM | POA: Diagnosis not present

## 2022-07-10 DIAGNOSIS — G479 Sleep disorder, unspecified: Secondary | ICD-10-CM | POA: Diagnosis present

## 2022-07-10 DIAGNOSIS — Z881 Allergy status to other antibiotic agents status: Secondary | ICD-10-CM

## 2022-07-10 DIAGNOSIS — F32A Depression, unspecified: Secondary | ICD-10-CM | POA: Diagnosis present

## 2022-07-10 DIAGNOSIS — K219 Gastro-esophageal reflux disease without esophagitis: Secondary | ICD-10-CM | POA: Diagnosis present

## 2022-07-10 HISTORY — DX: Other retention of urine: R33.8

## 2022-07-10 HISTORY — DX: Acute kidney failure, unspecified: N17.9

## 2022-07-10 LAB — URINALYSIS, ROUTINE W REFLEX MICROSCOPIC
Bilirubin Urine: NEGATIVE
Glucose, UA: NEGATIVE mg/dL
Ketones, ur: NEGATIVE mg/dL
Leukocytes,Ua: NEGATIVE
Nitrite: NEGATIVE
Protein, ur: 30 mg/dL — AB
RBC / HPF: >50 RBC/hpf — ABNORMAL HIGH (ref 0–5)
Specific Gravity, Urine: 1.013 (ref 1.005–1.030)
Squamous Epithelial / HPF: NONE SEEN (ref 0–5)
pH: 5 (ref 5.0–8.0)

## 2022-07-10 LAB — COMPREHENSIVE METABOLIC PANEL
ALT: 20 U/L (ref 0–44)
AST: 21 U/L (ref 15–41)
Albumin: 3 g/dL — ABNORMAL LOW (ref 3.5–5.0)
Alkaline Phosphatase: 46 U/L (ref 38–126)
Anion gap: 5 (ref 5–15)
BUN: 37 mg/dL — ABNORMAL HIGH (ref 8–23)
CO2: 24 mmol/L (ref 22–32)
Calcium: 8.3 mg/dL — ABNORMAL LOW (ref 8.9–10.3)
Chloride: 102 mmol/L (ref 98–111)
Creatinine, Ser: 3.14 mg/dL — ABNORMAL HIGH (ref 0.61–1.24)
GFR, Estimated: 20 mL/min — ABNORMAL LOW (ref >60)
Glucose, Bld: 122 mg/dL — ABNORMAL HIGH (ref 70–99)
Potassium: 5 mmol/L (ref 3.5–5.1)
Sodium: 131 mmol/L — ABNORMAL LOW (ref 135–145)
Total Bilirubin: 0.7 mg/dL (ref 0.3–1.2)
Total Protein: 5.7 g/dL — ABNORMAL LOW (ref 6.5–8.1)

## 2022-07-10 LAB — LACTIC ACID, PLASMA: Lactic Acid, Venous: 1.3 mmol/L (ref 0.5–1.9)

## 2022-07-10 LAB — CBC
HCT: 33.6 % — ABNORMAL LOW (ref 39.0–52.0)
Hemoglobin: 11.1 g/dL — ABNORMAL LOW (ref 13.0–17.0)
MCH: 30.7 pg (ref 26.0–34.0)
MCHC: 33 g/dL (ref 30.0–36.0)
MCV: 93.1 fL (ref 80.0–100.0)
Platelets: 126 10*3/uL — ABNORMAL LOW (ref 150–400)
RBC: 3.61 MIL/uL — ABNORMAL LOW (ref 4.22–5.81)
RDW: 14.2 % (ref 11.5–15.5)
WBC: 6.8 10*3/uL (ref 4.0–10.5)
nRBC: 0 % (ref 0.0–0.2)

## 2022-07-10 LAB — CK: Total CK: 50 U/L (ref 49–397)

## 2022-07-10 MED ORDER — ALBUTEROL SULFATE (2.5 MG/3ML) 0.083% IN NEBU: 3.0000 mL | INHALATION_SOLUTION | Freq: Three times a day (TID) | RESPIRATORY_TRACT | Status: DC | PRN

## 2022-07-10 MED ORDER — SODIUM CHLORIDE 0.9 % IV SOLN
1.0000 g | INTRAVENOUS | Status: AC
  Administered 2022-07-10: 1 g via INTRAVENOUS
  Filled 2022-07-10: qty 10

## 2022-07-10 MED ORDER — SODIUM CHLORIDE 0.9 % IV SOLN: INTRAVENOUS | Status: DC

## 2022-07-10 MED ORDER — TAMSULOSIN HCL 0.4 MG PO CAPS
0.4000 mg | ORAL_CAPSULE | Freq: Every day | ORAL | Status: DC
  Administered 2022-07-11 – 2022-07-12 (×2): 0.4 mg via ORAL
  Filled 2022-07-10 (×2): qty 1

## 2022-07-10 MED ORDER — LEVOTHYROXINE SODIUM 50 MCG PO TABS
50.0000 ug | ORAL_TABLET | Freq: Every day | ORAL | Status: DC
  Administered 2022-07-13: 50 ug via ORAL
  Filled 2022-07-10 (×3): qty 1

## 2022-07-10 MED ORDER — PANTOPRAZOLE SODIUM 40 MG PO TBEC
40.0000 mg | DELAYED_RELEASE_TABLET | Freq: Every day | ORAL | Status: DC
  Administered 2022-07-11 – 2022-07-13 (×3): 40 mg via ORAL
  Filled 2022-07-10 (×4): qty 1

## 2022-07-10 MED ORDER — ALUM & MAG HYDROXIDE-SIMETH 200-200-20 MG/5ML PO SUSP
30.0000 mL | Freq: Four times a day (QID) | ORAL | Status: DC | PRN
Start: 2022-07-10 — End: 2022-07-13
  Administered 2022-07-10 – 2022-07-11 (×2): 30 mL via ORAL
  Filled 2022-07-10 (×3): qty 30

## 2022-07-10 MED ORDER — ONDANSETRON HCL 4 MG/2ML IJ SOLN
4.0000 mg | Freq: Four times a day (QID) | INTRAMUSCULAR | Status: DC | PRN
  Administered 2022-07-11: 4 mg via INTRAVENOUS
  Filled 2022-07-10: qty 2

## 2022-07-10 MED ORDER — SODIUM CHLORIDE 0.9 % IV BOLUS
500.0000 mL | Freq: Once | INTRAVENOUS | Status: AC
  Administered 2022-07-10: 500 mL via INTRAVENOUS

## 2022-07-10 MED ORDER — FLUDROCORTISONE ACETATE 0.1 MG PO TABS
0.1000 mg | ORAL_TABLET | Freq: Every day | ORAL | Status: DC
  Administered 2022-07-10 – 2022-07-12 (×3): 0.1 mg via ORAL
  Filled 2022-07-10 (×3): qty 1

## 2022-07-10 MED ORDER — FINASTERIDE 5 MG PO TABS
5.0000 mg | ORAL_TABLET | Freq: Every day | ORAL | Status: DC
  Administered 2022-07-11 – 2022-07-13 (×3): 5 mg via ORAL
  Filled 2022-07-10 (×3): qty 1

## 2022-07-10 MED ORDER — PALIPERIDONE ER 3 MG PO TB24
3.0000 mg | ORAL_TABLET | Freq: Every day | ORAL | Status: DC
  Administered 2022-07-11 – 2022-07-12 (×2): 3 mg via ORAL
  Filled 2022-07-10 (×2): qty 1

## 2022-07-10 MED ORDER — SODIUM CHLORIDE 0.9 % IV BOLUS
1000.0000 mL | Freq: Once | INTRAVENOUS | Status: AC
  Administered 2022-07-10: 1000 mL via INTRAVENOUS

## 2022-07-10 MED ORDER — ATORVASTATIN CALCIUM 10 MG PO TABS
10.0000 mg | ORAL_TABLET | Freq: Every evening | ORAL | Status: DC
  Administered 2022-07-11 – 2022-07-12 (×2): 10 mg via ORAL
  Filled 2022-07-10 (×2): qty 1

## 2022-07-10 MED ORDER — ONDANSETRON HCL 4 MG PO TABS: 4.0000 mg | ORAL_TABLET | Freq: Four times a day (QID) | ORAL | Status: DC | PRN

## 2022-07-10 MED ORDER — VALPROIC ACID 250 MG PO CAPS
250.0000 mg | ORAL_CAPSULE | Freq: Two times a day (BID) | ORAL | Status: DC
  Administered 2022-07-11 – 2022-07-13 (×5): 250 mg via ORAL
  Filled 2022-07-10 (×5): qty 1

## 2022-07-10 MED ORDER — SODIUM CHLORIDE 0.9 % IV SOLN
1.0000 g | INTRAVENOUS | Status: DC
  Administered 2022-07-11 – 2022-07-12 (×2): 1 g via INTRAVENOUS
  Filled 2022-07-10: qty 1
  Filled 2022-07-10: qty 10

## 2022-07-10 MED ORDER — ACETAMINOPHEN 325 MG PO TABS
650.0000 mg | ORAL_TABLET | Freq: Four times a day (QID) | ORAL | Status: DC | PRN
  Administered 2022-07-11 – 2022-07-12 (×3): 650 mg via ORAL
  Filled 2022-07-10 (×3): qty 2

## 2022-07-10 MED ORDER — ASPIRIN 81 MG PO TBEC
81.0000 mg | DELAYED_RELEASE_TABLET | Freq: Every day | ORAL | Status: DC
  Administered 2022-07-11 – 2022-07-12 (×2): 81 mg via ORAL
  Filled 2022-07-10 (×2): qty 1

## 2022-07-10 MED ORDER — ACETAMINOPHEN 650 MG RE SUPP: 650.0000 mg | Freq: Four times a day (QID) | RECTAL | Status: DC | PRN

## 2022-07-10 MED ORDER — CYCLOSPORINE 0.05 % OP EMUL
1.0000 [drp] | Freq: Two times a day (BID) | OPHTHALMIC | Status: DC | PRN
Start: 2022-07-10 — End: 2022-07-13

## 2022-07-10 MED ORDER — MONTELUKAST SODIUM 10 MG PO TABS
5.0000 mg | ORAL_TABLET | Freq: Every day | ORAL | Status: DC
  Administered 2022-07-11 – 2022-07-12 (×2): 5 mg via ORAL
  Filled 2022-07-10 (×2): qty 1

## 2022-07-10 MED ORDER — LACTATED RINGERS IV SOLN: INTRAVENOUS | Status: DC

## 2022-07-10 MED ORDER — POLYETHYLENE GLYCOL 3350 17 G PO PACK
17.0000 g | PACK | Freq: Every day | ORAL | Status: DC | PRN
  Administered 2022-07-11 – 2022-07-13 (×3): 17 g via ORAL
  Filled 2022-07-10 (×3): qty 1

## 2022-07-10 NOTE — ED Triage Notes (Signed)
Pt via EMS from [place]. States that he having some urinary frequency, states the has been using the bathroom every 30 mins all night. States that he was here couple of days ago and he was retaining and they had to do an in an out cath on his twice. Pt denies pain. Pt is A&OX4 and NAD

## 2022-07-10 NOTE — ED Provider Notes (Signed)
[facility] Provider Note    Event Date/Time   First MD Initiated Contact with Patient 07/10/22 1120     (approximate)   History   Urinary Retention   HPI  Dakota Harper is a 76 y.o. male  anxiety, arthritis, COPD, depression, hypertension, hyperlipidemia and CKD stage III.  I reviewed his discharge summary from August 31 of this year  Patient presents today for frequent urination and difficulty urinating  Patient reports for about a day now he had a couple episodes where he felt like he is having to frequently urinate about every 30 seconds or so, but was unable.  He reports he is feeling relief of the difficulty with urinating after he had a catheter placed at triage  No fevers or chills no nausea or vomiting.  Was recently on prednisone now on a new steroid for his adrenal glands  Reports has been at the hospital twice in the last few days was recently admitted     Physical Exam   Triage Vital Signs: ED Triage Vitals  Enc Vitals Group     BP 07/10/22 1020 107/62     Pulse Rate 07/10/22 1020 89     Resp 07/10/22 1020 18     Temp 07/10/22 1020 97.9 F (36.6 C)     Temp Source 07/10/22 1020 Oral     SpO2 07/10/22 1020 99 %     Weight 07/10/22 1021 205 lb (93 kg)     Height 07/10/22 1021 '5\' 8"'$  (1.727 m)     Head Circumference --      Peak Flow --      Pain Score 07/10/22 1035 0     Pain Loc --      Pain Edu? --      Excl. in [place]? --     Most recent vital signs: Vitals:   07/10/22 1020  BP: 107/62  Pulse: 89  Resp: 18  Temp: 97.9 F (36.6 C)  SpO2: 99%     General: Awake, no distress.  Pleasant.  Slightly flat affect CV:  Good peripheral perfusion.  Resp:  Normal effort.  Clear bilateral Abd:  No distention.  Soft nontender nondistended.  Denies pain in any quadrant Other:  Normal circumcised penis.  Foley catheter protruding draining somewhat dark but none turbid urine.  This drained about 400 mL   ED Results / Procedures  / Treatments   Labs (all labs ordered are listed, but only abnormal results are displayed) Labs Reviewed  URINALYSIS, ROUTINE W REFLEX MICROSCOPIC - Abnormal; Notable for the following components:      Result Value   Color, Urine YELLOW (*)    APPearance CLOUDY (*)    Hgb urine dipstick LARGE (*)    Protein, ur 30 (*)    RBC / HPF >50 (*)    Bacteria, UA RARE (*)    All other components within normal limits  CBC - Abnormal; Notable for the following components:   RBC 3.61 (*)    Hemoglobin 11.1 (*)    HCT 33.6 (*)    Platelets 126 (*)    All other components within normal limits  COMPREHENSIVE METABOLIC PANEL - Abnormal; Notable for the following components:   Sodium 131 (*)    Glucose, Bld 122 (*)    BUN 37 (*)    Creatinine, Ser 3.14 (*)    Calcium 8.3 (*)    Total Protein 5.7 (*)    Albumin 3.0 (*)  GFR, Estimated 20 (*)    All other components within normal limits  URINE CULTURE  LACTIC ACID, PLASMA  CK   Labs reviewed by me notable for mild hyponatremia, notably elevated creatinine 3.1 and previous appears significantly above his prior baselines especially on his last couple checks where he has been slightly elevated but peers to be acute change since August  Urinalysis is yellow with large amount of blood noted also bacteria  EKG     RADIOLOGY  I interpreted the patient's CT abdomen pelvis without contrast is negative for obvious acute obstructive uropathy.  Defer to radiologist for additional read  CT Renal Stone Study  Result Date: 07/10/2022 CLINICAL DATA:  Urinary retention. Neurogenic bladder dysfunction. Constipation. EXAM: CT ABDOMEN AND PELVIS WITHOUT CONTRAST TECHNIQUE: Multidetector CT imaging of the abdomen and pelvis was performed following the standard protocol without IV contrast. RADIATION DOSE REDUCTION: This exam was performed according to the departmental dose-optimization program which includes automated exposure control, adjustment of the mA  and/or kV according to patient size and/or use of iterative reconstruction technique. COMPARISON:  06/08/2022 FINDINGS: Lower chest: No acute findings. Hepatobiliary: No mass visualized on this unenhanced exam. Prior cholecystectomy. No evidence of biliary obstruction. Pancreas: No mass or inflammatory process visualized on this unenhanced exam. Spleen:  Within normal limits in size. Adrenals/Urinary tract: No evidence of urolithiasis or hydronephrosis. Foley catheter is seen within the urinary bladder which is empty. Stomach/Bowel: No evidence of obstruction, inflammatory process, or abnormal fluid collections. Vascular/Lymphatic: No pathologically enlarged lymph nodes identified. No evidence of abdominal aortic aneurysm. Aortic atherosclerotic calcification incidentally noted. Reproductive:  Markedly enlarged prostate gland again noted. Other:  None. Musculoskeletal: No suspicious bone lesions identified. 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 IMPRESSION: No evidence of urolithiasis, hydronephrosis, or other acute findings. Markedly enlarged prostate. Electronically Signed   By: Marlaine Hind M.D.   On: 07/10/2022 13:06      PROCEDURES:  Critical Care performed: No  Procedures   MEDICATIONS ORDERED IN ED: Medications  fludrocortisone (FLORINEF) tablet 0.1 mg (0.1 mg Oral Given 07/10/22 1211)  cefTRIAXone (ROCEPHIN) 1 g in sodium chloride 0.9 % 100 mL IVPB (has no administration in time range)  lactated ringers infusion (has no administration in time range)  acetaminophen (TYLENOL) tablet 650 mg (has no administration in time range)    Or  acetaminophen (TYLENOL) suppository 650 mg (has no administration in time range)  ondansetron (ZOFRAN) tablet 4 mg (has no administration in time range)    Or  ondansetron (ZOFRAN) injection 4 mg (has no administration in time range)  sodium chloride 0.9 % bolus 500 mL (500 mLs Intravenous New Bag/Given 07/10/22 1216)  cefTRIAXone (ROCEPHIN)  1 g in sodium chloride 0.9 % 100 mL IVPB (1 g Intravenous New Bag/Given 07/10/22 1216)     IMPRESSION / MDM / ASSESSMENT AND PLAN / ED COURSE  I reviewed the triage vital signs and the nursing notes.                              Differential diagnosis includes, but is not limited to, urinary tract infection, ureteral obstruction, worsening AKI, ATN, sepsis, etc.  No acute neurologic cardiac or pulmonary symptoms.  Reassuring examination and improvement in difficulty with voiding after Foley insertion with good drainage that does show some blood tingeing now and some concern for possible UTI.  At least enough concern for me to initiate antibiotic as we  await culture result    Patient's presentation is most consistent with acute illness / injury with system symptoms.  The patient is on the cardiac monitor to evaluate for evidence of arrhythmia and/or significant heart rate changes.  Given the patient's notable rise in his creatinine from his previous baseline which appears to represent a significant change f occurring in the last few days now with difficulty voiding concern for possible UTI will admit him to the hospital for further care and treatment.  I consulted with the hospitalist Dr. Tennis Must Is agreeable with the plan for admission and further work-up     FINAL CLINICAL IMPRESSION(S) / ED DIAGNOSES   Final diagnoses:  Lower urinary tract infectious disease  Acute urinary retention  AKI (acute kidney injury) ([place])  Enlarged prostate     Rx / DC Orders   ED Discharge Orders     None        Note:  This document was prepared using Dragon voice recognition software and may include unintentional dictation errors.   Delman Kitten, MD 07/10/22 1341

## 2022-07-10 NOTE — ED Notes (Signed)
Pt is resting quietly Denies any pain

## 2022-07-10 NOTE — H&P (Addendum)
History and Physical    Patient: Dakota Harper GBT:[id number] DOB: [birth year] DOA: 07/10/2022 DOS: the patient was seen and examined on 07/10/2022 PCP: [name], NP  Patient coming from: ALF/ILF  Chief Complaint:  Chief Complaint  Patient presents with   Urinary Retention   HPI: Dakota Harper is a 76 y.o. male with medical history significant of anxiety, depression, osteoarthritis, asthma, COPD, COVID-19, basal cell carcinoma, BPH, colon adenomas, dysplastic nevus, GERD, gout, headaches, cholelithiasis, hyperlipidemia, hypertension, hypothyroidism, peripheral neuropathy, history of pneumonia, sleep disorder who is coming to emergency department from his assisted living facility due to urinary retention for the past 2 to 3 days associated with suprapubic tenderness and constipation for the past 3 days. He denied fever, chills, rhinorrhea, sore throat, wheezing or hemoptysis.  No chest pain, palpitations, diaphoresis, PND, orthopnea or pitting edema of the lower extremities.  No abdominal pain, nausea, emesis, diarrhea, constipation, melena or hematochezia.  No flank pain or hematuria.  No polyuria, polydipsia, polyphagia or blurred vision.   ED course: Initial vital signs were temperature 97.9 F, pulse 89, respiration 18, BP 107/62 mmHg O2 sat 99% on room air. The patient had a Foley placed in, which drained about 1700 mL of urine, ceftriaxone 1 g IVPB and 500 mL of normal saline bolus.  Lab work: His urinalysis was cloudy in appearance with large hemoglobinuria, more than 50 RBCs, 21-50 WBC per hpf and rare bacteria on microscopic examination.  CBC showed a white count 6.8, hemoglobin 11.1 g/dL platelets 126.  Lactic acid and total CK were normal.  CMP showed a sodium 131 mmol/L, the rest of the electrolytes are normal after calcium correction.  Glucose 122, BUN 37 creatinine 3.14 mg/dL.  4 days ago was 1.48 mg/dL.  LFTs show a total protein of 5.7 and albumin of 3.0 g/dL.  The rest of  the LFTs were normal.  Imaging: CT renal study without evidence of urolithiasis, hydronephrosis or other acute findings.   Review of Systems: As mentioned in the history of present illness. All other systems reviewed and are negative. Past Medical History:  Diagnosis Date   Anxiety    Arthritis    Asthma    Basal cell carcinoma 07/04/2021   L nasal ala. MOHS completed 12/13/2021   BPH (benign prostatic hyperplasia)    Colon adenomas    COPD (chronic obstructive pulmonary disease) ([place])    COVID    Depression    in the 1960's   Dysplastic nevus 02/28/2021   R scapula, mod atypia   Ear pain    GERD (gastroesophageal reflux disease)    Gout    Headache    History of kidney stones    Hypercholesteremia    Hypertension    Hypothyroidism    Neuropathy    Pneumonia    Sleep disorder    Past Surgical History:  Procedure Laterality Date   ADJACENT TISSUE TRANSFER/TISSUE REARRANGEMENT Left 12/19/2021   Procedure: ADJACENT TISSUE TRANSFER/TISSUE REARRANGEMENT;  Surgeon: Cindra Presume, MD;  Location: [facility];  Service: Plastics;  Laterality: Left;   CHOLECYSTECTOMY     COLONOSCOPY     COLONOSCOPY WITH PROPOFOL N/A 08/01/2021   Procedure: COLONOSCOPY WITH PROPOFOL;  Surgeon: Jonathon Bellows, MD;  Location: [facility] ENDOSCOPY;  Service: Gastroenterology;  Laterality: N/A;   ESOPHAGOGASTRODUODENOSCOPY (EGD) WITH PROPOFOL N/A 11/30/2021   Procedure: ESOPHAGOGASTRODUODENOSCOPY (EGD) WITH PROPOFOL;  Surgeon: Jonathon Bellows, MD;  Location: [facility] ENDOSCOPY;  Service: Gastroenterology;  Laterality: N/A;   HERNIA REPAIR  NASAL RECONSTRUCTION Left 12/19/2021   Procedure: NASAL RECONSTRUCTION;  Surgeon: Cindra Presume, MD;  Location: [facility];  Service: Plastics;  Laterality: Left;  2 hours   pituitary tumor excision     Social History:  reports that he has quit smoking. His smoking use included cigarettes. He has never used smokeless tobacco. He reports that he does not drink alcohol and does not use  drugs.  Allergies  Allergen Reactions   Saw [place] Hives and Rash    "Berry form"   Other Other (See Comments) and Rash   Sulfa Antibiotics Rash   Sulfacetamide Sodium Rash    Family History  Problem Relation Age of Onset   Kidney cancer Mother    Heart attack Father    Alzheimer's disease Father    Heart attack Sister    Diabetes Mellitus II Sister    Benign prostatic hyperplasia Brother    Prostate cancer Neg Hx    Bladder Cancer Neg Hx     Prior to Admission medications   Medication Sig Start Date End Date Taking? Authorizing Provider  acetaminophen (TYLENOL) 500 MG tablet Take 500 mg by mouth every 4 (four) hours as needed for moderate pain or mild pain.    [provider]  albuterol (PROVENTIL HFA;VENTOLIN HFA) 108 (90 BASE) MCG/ACT inhaler Inhale 1 puff into the lungs 3 (three) times daily as needed for wheezing or shortness of breath.    [provider]  alum & mag hydroxide-simeth (MAALOX/MYLANTA) 200-200-20 MG/5ML suspension Take 30 mLs by mouth 4 (four) times daily as needed for indigestion or heartburn.    [provider]  aspirin EC 81 MG tablet Take 81 mg by mouth at bedtime. (2100)    [provider]  atorvastatin (LIPITOR) 10 MG tablet Take 10 mg by mouth every evening. (2000)    [provider]  carbamide peroxide (EAR WAX REMOVAL DROPS) 6.5 % OTIC solution Place 5 drops into both ears every Sunday.    [provider]  chlorhexidine (PERIDEX) 0.12 % solution Use as directed 15 mLs in the mouth or throat 2 (two) times daily as needed (oral discomfort).    [provider]  cycloSPORINE (RESTASIS) 0.05 % ophthalmic emulsion Place 1 drop into both eyes 2 (two) times daily as needed (dry eyes).    [provider]  diclofenac Sodium (VOLTAREN) 1 % GEL Apply 2 g topically as needed (lower left leg pain.). 03/31/21   [provider]  diphenhydrAMINE (BENADRYL) 25 MG tablet Take 25 mg by mouth  every 6 (six) hours as needed (allergic reaction). Patient not taking: Reported on 07/05/2022    [provider]  fenofibrate 54 MG tablet Take 1 tablet (54 mg total) by mouth daily. 11/01/20   Sidney Ace, MD  finasteride (PROSCAR) 5 MG tablet Take 1 tablet (5 mg total) by mouth daily. 01/22/20   McGowan, Hunt Oris, PA-C  fludrocortisone (FLORINEF) 0.1 MG tablet Take 1 tablet (0.1 mg total) by mouth daily for 14 days, THEN 1 tablet (0.1 mg total) every other day as needed for up to 14 days (For low blood pressure and dizziness). 07/06/22 08/03/22  Damita Lack, MD  Fluocinolone Acetonide Body 0.01 % OIL Apply to scalp 1-2 times a day and leave on. 07/04/22   Brendolyn Patty, MD  fluocinonide ointment (LIDEX) 8.67 % Apply 1 application topically 2 (two) times daily as needed (pruritus). (apply to legs)    [provider]  fluticasone furoate-vilanterol (BREO  ELLIPTA) 200-25 MCG/INH AEPB Inhale 1 puff into the lungs daily. (0800) 08/10/20   [provider]  gabapentin (NEURONTIN) 100 MG capsule Take 100 mg by mouth daily. 06/23/22   [provider]  gabapentin (NEURONTIN) 400 MG capsule Take 400 mg by mouth at bedtime. (2000) 11/08/21   [provider]  guaiFENesin (ROBITUSSIN) 100 MG/5ML liquid Take 300 mg by mouth every 6 (six) hours as needed for cough.    [provider]  ketoconazole (NIZORAL) 2 % shampoo Massage into scalp daily and let sit several minutes before rinsing. 10/12/21   Brendolyn Patty, MD  lactulose (CHRONULAC) 10 GM/15ML solution Take 30 mLs (20 g total) by mouth daily as needed for mild constipation. 03/20/21   Paulette Blanch, MD  levothyroxine (SYNTHROID, LEVOTHROID) 50 MCG tablet Take 50 mcg by mouth daily before breakfast. (0630)    [provider]  loperamide (IMODIUM) 2 MG capsule Take 4 mg by mouth as needed for diarrhea or loose stools.    [provider]  magnesium hydroxide (MILK OF MAGNESIA) 400 MG/5ML  suspension Take 30 mLs by mouth daily as needed (constipation (no bowel movement for 3 days)).    [provider]  meloxicam (MOBIC) 15 MG tablet Take 15 mg by mouth daily. 06/26/22   [provider]  mirabegron ER (MYRBETRIQ) 50 MG TB24 tablet Take 50 mg by mouth daily. (0900)    [provider]  montelukast (SINGULAIR) 5 MG chewable tablet Chew 5 mg by mouth at bedtime. (2100)    [provider]  nortriptyline (PAMELOR) 75 MG capsule Take 150 mg by mouth daily. (0900)    [provider]  omeprazole (PRILOSEC) 40 MG capsule Take 1 capsule (40 mg total) by mouth daily. 11/16/21   Jonathon Bellows, MD  ondansetron (ZOFRAN-ODT) 4 MG disintegrating tablet Take 1 tablet (4 mg total) by mouth every 8 (eight) hours as needed for nausea or vomiting. 12/07/21   Corena Herter, PA-C  paliperidone (INVEGA) 3 MG 24 hr tablet Take 3 mg by mouth at bedtime.    [provider]  polyethylene glycol (MIRALAX / GLYCOLAX) 17 g packet Take 17 g by mouth daily as needed (constipation.).    [provider]  Skin Protectants, Misc. (EUCERIN) cream Apply 1 application topically in the morning and at bedtime. (0800 & 2000)    [provider]  tamsulosin (FLOMAX) 0.4 MG CAPS capsule Take 0.4 mg by mouth at bedtime. (2100)    [provider]  tiZANidine (ZANAFLEX) 2 MG tablet Take 2 mg by mouth at bedtime. (2000)    [provider]  valproic acid (DEPAKENE) 250 MG capsule Take 250 mg by mouth 2 (two) times daily.    [provider]  white petrolatum (VASELINE) GEL Apply 1 application topically as directed. Apply vaseline to skin lesions and cover with band-aid until healed    [provider]    Physical Exam: Vitals:   07/10/22 1020 07/10/22 1021 07/10/22 1358  BP: 107/62  110/60  Pulse: 89  87  Resp: 18  18  Temp: 97.9 F (36.6 C)  98 F (36.7 C)  TempSrc: Oral  Oral  SpO2: 99%  99%  Weight:  93 kg   Height:  5'  8" (1.727 m)    Physical Exam Vitals and nursing note reviewed.  Constitutional:      General: He is awake. He is not in acute distress.    Appearance: Normal appearance. He is  obese. He is not toxic-appearing.  HENT:     Head: Normocephalic.     Nose: No rhinorrhea.     Mouth/Throat:     Mouth: Mucous membranes are dry.     Pharynx: No posterior oropharyngeal erythema.  Eyes:     General: No scleral icterus.    Pupils: Pupils are equal, round, and reactive to light.  Neck:     Vascular: No JVD.  Cardiovascular:     Rate and Rhythm: Normal rate and regular rhythm.     Heart sounds: S1 normal and S2 normal.  Pulmonary:     Effort: Pulmonary effort is normal.     Breath sounds: Normal breath sounds.  Abdominal:     General: Bowel sounds are normal. There is no distension.     Palpations: Abdomen is soft.     Tenderness: There is no abdominal tenderness. There is no guarding.  Musculoskeletal:     Cervical back: Neck supple.     Right lower leg: No edema.     Left lower leg: No edema.  Skin:    General: Skin is warm and dry.  Neurological:     General: No focal deficit present.     Mental Status: He is alert and oriented to person, place, and time.  Psychiatric:        Mood and Affect: Mood normal.        Behavior: Behavior normal. Behavior is cooperative.    Data Reviewed:  There are no new results to review at this time.  Assessment and Plan: Principal Problem:   AKI (acute kidney injury) ([place]) Secondary to:   Acute urinary retention Superimposed on:   Stage 3b chronic kidney disease (CKD) ([place]) Observation/telemetry. Continue IV fluids. Hold meloxicam. Hold losartan. Avoid hypotension. Avoid nephrotoxins. Monitor intake and output. Monitor renal function electrolytes.  Active Problems:   BPH (benign prostatic hyperplasia) Continue finasteride and tamsulosin pending med rec.    Hyponatremia Secondary to poor oral intake/mild adrenal  insufficiency. Continue fludrocortisone 0.1 mg p.o. daily. Intake and output. Follow sodium level.    Normocytic anemia 2.7 g drop from 4 days ago. Per patient no hematuria. No melena or hematochezia. Check stool occult blood. Monitor hematocrit and hemoglobin.    Thrombocytopenia (HCC) Monitor platelet count.    Hypertension, benign Not on antihypertensives at the moment. Blood pressure readings are soft.   Hypothyroidism Continue levothyroxine 50 mcg p.o. daily.    COPD with chronic bronchitis (HCC) Supplemental oxygen and bronchodilators as needed.    Hypercholesterolemia Continue atorvastatin 10 mg p.o. daily.      Mild protein malnutrition (HCC) Protein supplementation. Consider nutritional services evaluation.    Advance Care Planning:   Code Status: Full Code   Consults:   Family Communication:   Severity of Illness: The appropriate patient status for this patient is OBSERVATION. Observation status is judged to be reasonable and necessary in order to provide the required intensity of service to ensure the patient's safety. The patient's presenting symptoms, physical exam findings, and initial radiographic and laboratory data in the context of their medical condition is felt to place them at decreased risk for further clinical deterioration. Furthermore, it is anticipated that the patient will be medically stable for discharge from the hospital within 2 midnights of admission.   Author: Reubin Milan, MD 07/10/2022 2:38 PM  For on call review [email or web address].   This document was prepared using Dragon voice recognition software and may contain some unintended transcription errors.

## 2022-07-10 NOTE — ED Triage Notes (Signed)
First Nurse Note:  Pt via EMS from [place]. Pt c/o urinary retention, states that he has been having issues for past 2 weeks. States that he also is constipated for past 3 days. States he feel like he is retaining. Pt is A&Ox4 and NAD  126/71 86 HR 98% on RA 172 CBG  98.1 orally

## 2022-07-10 NOTE — Plan of Care (Signed)

## 2022-07-10 NOTE — ED Notes (Signed)
See triage note  Presents via EMS from the [facility] with some urinary retention   No fever  Also feels constipated

## 2022-07-10 NOTE — ED Notes (Signed)
Patient taken to CT scan.

## 2022-07-11 ENCOUNTER — Encounter: Payer: Self-pay | Admitting: Internal Medicine

## 2022-07-11 DIAGNOSIS — M1990 Unspecified osteoarthritis, unspecified site: Secondary | ICD-10-CM | POA: Diagnosis present

## 2022-07-11 DIAGNOSIS — F32A Depression, unspecified: Secondary | ICD-10-CM | POA: Diagnosis present

## 2022-07-11 DIAGNOSIS — D649 Anemia, unspecified: Secondary | ICD-10-CM | POA: Diagnosis present

## 2022-07-11 DIAGNOSIS — R338 Other retention of urine: Secondary | ICD-10-CM | POA: Diagnosis present

## 2022-07-11 DIAGNOSIS — Z8616 Personal history of COVID-19: Secondary | ICD-10-CM | POA: Diagnosis not present

## 2022-07-11 DIAGNOSIS — D696 Thrombocytopenia, unspecified: Secondary | ICD-10-CM

## 2022-07-11 DIAGNOSIS — K5900 Constipation, unspecified: Secondary | ICD-10-CM | POA: Diagnosis present

## 2022-07-11 DIAGNOSIS — E871 Hypo-osmolality and hyponatremia: Secondary | ICD-10-CM

## 2022-07-11 DIAGNOSIS — J449 Chronic obstructive pulmonary disease, unspecified: Secondary | ICD-10-CM | POA: Diagnosis present

## 2022-07-11 DIAGNOSIS — I129 Hypertensive chronic kidney disease with stage 1 through stage 4 chronic kidney disease, or unspecified chronic kidney disease: Secondary | ICD-10-CM | POA: Diagnosis present

## 2022-07-11 DIAGNOSIS — N390 Urinary tract infection, site not specified: Secondary | ICD-10-CM

## 2022-07-11 DIAGNOSIS — N1832 Chronic kidney disease, stage 3b: Secondary | ICD-10-CM

## 2022-07-11 DIAGNOSIS — Z6831 Body mass index (BMI) 31.0-31.9, adult: Secondary | ICD-10-CM | POA: Diagnosis not present

## 2022-07-11 DIAGNOSIS — T8383XA Hemorrhage of genitourinary prosthetic devices, implants and grafts, initial encounter: Secondary | ICD-10-CM | POA: Diagnosis not present

## 2022-07-11 DIAGNOSIS — E441 Mild protein-calorie malnutrition: Secondary | ICD-10-CM | POA: Diagnosis present

## 2022-07-11 DIAGNOSIS — E039 Hypothyroidism, unspecified: Secondary | ICD-10-CM | POA: Diagnosis present

## 2022-07-11 DIAGNOSIS — Z20822 Contact with and (suspected) exposure to covid-19: Secondary | ICD-10-CM | POA: Diagnosis present

## 2022-07-11 DIAGNOSIS — N401 Enlarged prostate with lower urinary tract symptoms: Secondary | ICD-10-CM | POA: Diagnosis present

## 2022-07-11 DIAGNOSIS — R310 Gross hematuria: Secondary | ICD-10-CM | POA: Diagnosis not present

## 2022-07-11 DIAGNOSIS — E669 Obesity, unspecified: Secondary | ICD-10-CM | POA: Diagnosis present

## 2022-07-11 DIAGNOSIS — I959 Hypotension, unspecified: Secondary | ICD-10-CM | POA: Diagnosis present

## 2022-07-11 DIAGNOSIS — Y846 Urinary catheterization as the cause of abnormal reaction of the patient, or of later complication, without mention of misadventure at the time of the procedure: Secondary | ICD-10-CM | POA: Diagnosis not present

## 2022-07-11 DIAGNOSIS — E2740 Unspecified adrenocortical insufficiency: Secondary | ICD-10-CM | POA: Diagnosis present

## 2022-07-11 DIAGNOSIS — N179 Acute kidney failure, unspecified: Secondary | ICD-10-CM | POA: Diagnosis present

## 2022-07-11 DIAGNOSIS — E7800 Pure hypercholesterolemia, unspecified: Secondary | ICD-10-CM | POA: Diagnosis present

## 2022-07-11 DIAGNOSIS — W19XXXA Unspecified fall, initial encounter: Secondary | ICD-10-CM | POA: Diagnosis present

## 2022-07-11 DIAGNOSIS — F419 Anxiety disorder, unspecified: Secondary | ICD-10-CM | POA: Diagnosis present

## 2022-07-11 HISTORY — DX: Urinary tract infection, site not specified: N39.0

## 2022-07-11 LAB — CBC
HCT: 36.6 % — ABNORMAL LOW (ref 39.0–52.0)
Hemoglobin: 12.3 g/dL — ABNORMAL LOW (ref 13.0–17.0)
MCH: 31.1 pg (ref 26.0–34.0)
MCHC: 33.6 g/dL (ref 30.0–36.0)
MCV: 92.4 fL (ref 80.0–100.0)
Platelets: 151 10*3/uL (ref 150–400)
RBC: 3.96 MIL/uL — ABNORMAL LOW (ref 4.22–5.81)
RDW: 14.3 % (ref 11.5–15.5)
WBC: 7.9 10*3/uL (ref 4.0–10.5)
nRBC: 0 % (ref 0.0–0.2)

## 2022-07-11 LAB — COMPREHENSIVE METABOLIC PANEL
ALT: 18 U/L (ref 0–44)
AST: 21 U/L (ref 15–41)
Albumin: 2.8 g/dL — ABNORMAL LOW (ref 3.5–5.0)
Alkaline Phosphatase: 47 U/L (ref 38–126)
Anion gap: 9 (ref 5–15)
BUN: 30 mg/dL — ABNORMAL HIGH (ref 8–23)
CO2: 20 mmol/L — ABNORMAL LOW (ref 22–32)
Calcium: 8 mg/dL — ABNORMAL LOW (ref 8.9–10.3)
Chloride: 109 mmol/L (ref 98–111)
Creatinine, Ser: 2.43 mg/dL — ABNORMAL HIGH (ref 0.61–1.24)
GFR, Estimated: 27 mL/min — ABNORMAL LOW (ref >60)
Glucose, Bld: 117 mg/dL — ABNORMAL HIGH (ref 70–99)
Potassium: 4.8 mmol/L (ref 3.5–5.1)
Sodium: 138 mmol/L (ref 135–145)
Total Bilirubin: 0.6 mg/dL (ref 0.3–1.2)
Total Protein: 5.5 g/dL — ABNORMAL LOW (ref 6.5–8.1)

## 2022-07-11 LAB — URINE CULTURE: Culture: NO GROWTH

## 2022-07-11 MED ORDER — SODIUM CHLORIDE 0.9 % IV SOLN: INTRAVENOUS | Status: AC

## 2022-07-11 MED ORDER — HYDRALAZINE HCL 20 MG/ML IJ SOLN
5.0000 mg | Freq: Four times a day (QID) | INTRAMUSCULAR | Status: DC | PRN
  Filled 2022-07-11: qty 1

## 2022-07-11 NOTE — Progress Notes (Signed)
       CROSS COVER NOTE  NAME: Dakota Harper MRN: [id number] DOB : September 17, 1946    Date of Service   07/11/2022   HPI/Events of Note   Notified of patient's elevated BP 195/91. Pt requesting home Losartan be restarted. On review of chart losartan was stopped at discharge on 07/06/22 secondary to intermittent hypotension and it is unclear if medication has been restarted by primary care provider.  Interventions   Plan: Hydralazine 5 mg IV PRN SBP >160    This document was prepared using Dragon voice recognition software and may include unintentional dictation errors.  Neomia Glass DNP, MHA, FNP-BC Nurse Practitioner [facility] Pager [phone]

## 2022-07-11 NOTE — Care Management Obs Status (Signed)
[facility] NOTIFICATION   Patient Details  Name: Dakota Harper MRN: [id number] Date of Birth: [birth year]   Medicare Observation Status Notification Given:  Yes    Beverly Sessions, RN 07/11/2022, 3:27 PM

## 2022-07-11 NOTE — Progress Notes (Signed)
Mobility Specialist - Progress Note    07/11/22 1514  Mobility  Activity Ambulated with assistance in hallway  Level of Assistance Standby assist, set-up cues, supervision of patient - no hands on  Assistive Device Front wheel walker  Distance Ambulated (ft) 160 ft  Activity Response Tolerated well  $Mobility charge 1 Mobility   Pt supine upon entry, utilizing RA. Pt completed bed mob indep. Pt amb one lap around NS using RW with SBA. Pt left supine with alarm set and needs within reach. No complaints.   [place]ie [place] Mobility Specialist 07/11/22 3:16 PM

## 2022-07-11 NOTE — Progress Notes (Addendum)
Progress Note    Dakota Harper  EXB:[id number] DOB: 04-27-1946  DOA: 07/10/2022 PCP: [name], NP      Brief Narrative:    Medical records reviewed and are as summarized below:  Dakota Harper is a 76 y.o. male with medical history significant of anxiety, depression, osteoarthritis, asthma, COPD, COVID-19, basal cell carcinoma, BPH, colon adenomas, dysplastic nevus, GERD, gout, headaches, cholelithiasis, hyperlipidemia, hypertension, hypothyroidism, peripheral neuropathy, history of pneumonia, sleep disorder.  He presented from the assisted living facility to the emergency room because of 2 to 3-day history of urinary retention associated with suprapubic tenderness, frequent urination and constipation.  He was found to have acute urinary retention, AKI and acute UTI.  Foley catheter placed in the emergency room drained about 1.7 L of urine.  He was treated with empiric IV ceftriaxone and IV fluids.     Assessment/Plan:   Principal Problem:   AKI (acute kidney injury) ([place]) Active Problems:   Acute urinary retention   Hypertension, benign   Stage 3b chronic kidney disease (CKD) (HCC)   Acute UTI   BPH (benign prostatic hyperplasia)   Hypothyroidism   COPD with chronic bronchitis (HCC)   Hypercholesterolemia   Hyponatremia   Mild protein malnutrition (HCC)   Thrombocytopenia (HCC)   Normocytic anemia     Body mass index is 31.17 kg/m.  (Obesity)    AKI on CKD stage IIIb: Creatinine is trending down (3.14-2.43).  Continue IV fluids for hydration.  Decrease rate from 125 mL/h to 75 mL/h.  Acute urinary retention in the setting of underlying BPH: Continue Foley catheter.  Continue finasteride and tamsulosin.  No urolithiasis or hydronephrosis on CT abdomen and pelvis.  Acute UTI: Continue IV ceftriaxone.  Follow-up urine cultures.  Hyponatremia and thrombocytopenia: Improved  Other comorbidities include COPD with chronic bronchitis, hypertension,  normocytic anemia    Diet Order             Diet Heart Room service appropriate? Yes; Fluid consistency: Thin  Diet effective now                            Consultants: None  Procedures: None    Medications:    aspirin EC  81 mg Oral QHS   atorvastatin  10 mg Oral QPM   finasteride  5 mg Oral Daily   fludrocortisone  0.1 mg Oral Daily   levothyroxine  50 mcg Oral Q0600   montelukast  5 mg Oral QHS   paliperidone  3 mg Oral QHS   pantoprazole  40 mg Oral Daily   tamsulosin  0.4 mg Oral QHS   valproic acid  250 mg Oral BID   Continuous Infusions:  sodium chloride     cefTRIAXone (ROCEPHIN)  IV 1 g (07/11/22 1159)     Anti-infectives (From admission, onward)    Start     Dose/Rate Route Frequency Ordered Stop   07/11/22 1200  cefTRIAXone (ROCEPHIN) 1 g in sodium chloride 0.9 % 100 mL IVPB        1 g 200 mL/hr over 30 Minutes Intravenous Every 24 hours 07/10/22 1305     07/10/22 1200  cefTRIAXone (ROCEPHIN) 1 g in sodium chloride 0.9 % 100 mL IVPB        1 g 200 mL/hr over 30 Minutes Intravenous STAT 07/10/22 1150 07/10/22 1429              Family Communication/Anticipated  D/C date and plan/Code Status   DVT prophylaxis: SCDs Start: 07/10/22 1309     Code Status: Full Code  Family Communication: None Disposition Plan: Plan to discharge to ALF in 1 to 2 days   Status is: Observation The patient will require care spanning > 2 midnights and should be moved to inpatient because: AKI, urinary retention, UTI, IV antibiotics and IV fluids       Subjective:   Interval events noted.  He feels a little better today.  Objective:    Vitals:   07/10/22 1522 07/10/22 2002 07/11/22 0307 07/11/22 0910  BP: 133/69 (!) 153/68 (!) 145/58 (!) 143/69  Pulse: 73 78 87 88  Resp: '20 20 20   '$ Temp:  97.7 F (36.5 C) 97.8 F (36.6 C) 98.3 F (36.8 C)  TempSrc:  Oral Oral   SpO2: 100% 100% 100% 99%  Weight:      Height:       No data  found.   Intake/Output Summary (Last 24 hours) at 07/11/2022 1455 Last data filed at 07/11/2022 1120 Gross per 24 hour  Intake 2700.17 ml  Output 3600 ml  Net -899.83 ml   Filed Weights   07/10/22 1021  Weight: 93 kg    Exam:  GEN: NAD SKIN: No rash EYES: EOMI ENT: MMM CV: RRR PULM: CTA B ABD: soft, ND, NT, +BS CNS: AAO x 3, non focal EXT: No edema or tenderness GU: Foley catheter draining amber urine        Data Reviewed:   I have personally reviewed following labs and imaging studies:  Labs: Labs show the following:   Basic Metabolic Panel: Recent Labs  Lab 07/06/22 0734 07/08/22 1748 07/08/22 2021 07/10/22 1154 07/11/22 0408  NA 139 134* 132* 131* 138  K 4.4 4.6 4.6 5.0 4.8  CL 108 104 104 102 109  CO2 24 22 19* 24 20*  GLUCOSE 141* 110* 119* 122* 117*  BUN 29* 40* 40* 37* 30*  CREATININE 1.48* 2.93* 2.84* 3.14* 2.43*  CALCIUM 8.9 8.3* 7.8* 8.3* 8.0*  MG 2.2  --   --   --   --    GFR Estimated Creatinine Clearance: 28.6 mL/min (A) (by C-G formula based on SCr of 2.43 mg/dL (H)). Liver Function Tests: Recent Labs  Lab 07/05/22 0341 07/10/22 1154 07/11/22 0408  AST '15 21 21  '$ ALT '18 20 18  '$ ALKPHOS 47 46 47  BILITOT 0.5 0.7 0.6  PROT 5.6* 5.7* 5.5*  ALBUMIN 3.1* 3.0* 2.8*   No results for input(s): "LIPASE", "AMYLASE" in the last 168 hours. No results for input(s): "AMMONIA" in the last 168 hours. Coagulation profile No results for input(s): "INR", "PROTIME" in the last 168 hours.  CBC: Recent Labs  Lab 07/05/22 0341 07/06/22 0734 07/08/22 1640 07/10/22 1154 07/11/22 0408  WBC 8.6 7.0 6.7 6.8 7.9  HGB 13.5 13.8 12.2* 11.1* 12.3*  HCT 40.7 41.9 37.4* 33.6* 36.6*  MCV 94.0 92.5 94.2 93.1 92.4  PLT 150 164 147* 126* 151   Cardiac Enzymes: Recent Labs  Lab 07/10/22 1154  CKTOTAL 50   BNP (last 3 results) No results for input(s): "PROBNP" in the last 8760 hours. CBG: No results for input(s): "GLUCAP" in the last 168  hours. D-Dimer: No results for input(s): "DDIMER" in the last 72 hours. Hgb A1c: No results for input(s): "HGBA1C" in the last 72 hours. Lipid Profile: No results for input(s): "CHOL", "HDL", "LDLCALC", "TRIG", "CHOLHDL", "LDLDIRECT" in the last 72 hours. Thyroid  function studies: No results for input(s): "TSH", "T4TOTAL", "T3FREE", "THYROIDAB" in the last 72 hours.  Invalid input(s): "FREET3" Anemia work up: No results for input(s): "VITAMINB12", "FOLATE", "FERRITIN", "TIBC", "IRON", "RETICCTPCT" in the last 72 hours. Sepsis Labs: Recent Labs  Lab 07/04/22 1926 07/05/22 0341 07/06/22 0734 07/08/22 1640 07/10/22 1154 07/11/22 0408  WBC  --    < > 7.0 6.7 6.8 7.9  LATICACIDVEN 1.1  --   --   --  1.3  --    < > = values in this interval not displayed.    Microbiology Recent Results (from the past 240 hour(s))  SARS Coronavirus 2 by RT PCR (hospital order, performed in [facility] hospital lab) *cepheid single result test* Anterior Nasal Swab     Status: None   Collection Time: 07/08/22  5:16 PM   Specimen: Anterior Nasal Swab  Result Value Ref Range Status   SARS Coronavirus 2 by RT PCR NEGATIVE NEGATIVE Final    Comment: (NOTE) SARS-CoV-2 target nucleic acids are NOT DETECTED.  The SARS-CoV-2 RNA is generally detectable in upper and lower respiratory specimens during the acute phase of infection. The lowest concentration of SARS-CoV-2 viral copies this assay can detect is 250 copies / mL. A negative result does not preclude SARS-CoV-2 infection and should not be used as the sole basis for treatment or other patient management decisions.  A negative result may occur with improper specimen collection / handling, submission of specimen other than nasopharyngeal swab, presence of viral mutation(s) within the areas targeted by this assay, and inadequate number of viral copies (<250 copies / mL). A negative result must be combined with clinical observations, patient history,  and epidemiological information.  Fact Sheet for Patients:   https://www.patel.info/  Fact Sheet for Healthcare Providers: https://hall.com/  This test is not yet approved or  cleared by the [place] FDA and has been authorized for detection and/or diagnosis of SARS-CoV-2 by FDA under an Emergency Use Authorization (EUA).  This EUA will remain in effect (meaning this test can be used) for the duration of the COVID-19 declaration under Section 564(b)(1) of the Act, 21 U.S.C. section 360bbb-3(b)(1), unless the authorization is terminated or revoked sooner.  Performed at [facility], [address], [place], [place] [address]     Procedures and diagnostic studies:  CT Renal Stone Study  Result Date: 07/10/2022 CLINICAL DATA:  Urinary retention. Neurogenic bladder dysfunction. Constipation. EXAM: CT ABDOMEN AND PELVIS WITHOUT CONTRAST TECHNIQUE: Multidetector CT imaging of the abdomen and pelvis was performed following the standard protocol without IV contrast. RADIATION DOSE REDUCTION: This exam was performed according to the departmental dose-optimization program which includes automated exposure control, adjustment of the mA and/or kV according to patient size and/or use of iterative reconstruction technique. COMPARISON:  06/08/2022 FINDINGS: Lower chest: No acute findings. Hepatobiliary: No mass visualized on this unenhanced exam. Prior cholecystectomy. No evidence of biliary obstruction. Pancreas: No mass or inflammatory process visualized on this unenhanced exam. Spleen:  Within normal limits in size. Adrenals/Urinary tract: No evidence of urolithiasis or hydronephrosis. Foley catheter is seen within the urinary bladder which is empty. Stomach/Bowel: No evidence of obstruction, inflammatory process, or abnormal fluid collections. Vascular/Lymphatic: No pathologically enlarged lymph nodes identified. No evidence of abdominal aortic  aneurysm. Aortic atherosclerotic calcification incidentally noted. Reproductive:  Markedly enlarged prostate gland again noted. Other:  None. Musculoskeletal: No suspicious bone lesions identified. 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 IMPRESSION: No evidence of urolithiasis, hydronephrosis, or other acute  findings. Markedly enlarged prostate. Electronically Signed   By: Marlaine Hind M.D.   On: 07/10/2022 13:06               LOS: 0 days   Clarene Curran  [facility]   Pager on [email or web address]. If 7PM-7AM, please contact night-coverage at [email or web address]     07/11/2022, 2:55 PM

## 2022-07-11 NOTE — TOC Initial Note (Signed)
Transition of Care [facility]) - Initial/Assessment Note    Patient Details  Name: Dakota Harper MRN: [id number] Date of Birth: [birth year]  Transition of Care [facility]) CM/SW Contact:    Beverly Sessions, RN Phone Number: 07/11/2022, 2:51 PM  Clinical Narrative:                    Admitted for: urinary rentention   Admitted from: [facility] PCP: Alroy Dust    Patient open with [facility] home health.  Meg with [facility] aware of admission Patient plans to return to the [facility] at discharge.  Patient states he will require [facility] to provide transport at discharge [place] [facility] the [facility] confirms patient can return at discharge       Patient Goals and CMS Choice        Expected Discharge Plan and Services                                                Prior Living Arrangements/Services                       Activities of Daily Living Home Assistive Devices/Equipment: Wheelchair ADL Screening (condition at time of admission) Patient's cognitive ability adequate to safely complete daily activities?: Yes Is the patient deaf or have difficulty hearing?: No Does the patient have difficulty seeing, even when wearing glasses/contacts?: No Does the patient have difficulty concentrating, remembering, or making decisions?: No Patient able to express need for assistance with ADLs?: Yes Does the patient have difficulty dressing or bathing?: No Independently performs ADLs?: Yes (appropriate for developmental age) Does the patient have difficulty walking or climbing stairs?: No Weakness of Legs: None Weakness of Arms/Hands: None  Permission Sought/Granted                  Emotional Assessment              Admission diagnosis:  Lower urinary tract infectious disease [N39.0] Enlarged prostate [N40.0] AKI (acute kidney injury) ([place]) [N17.9] Acute urinary retention [R33.8] Patient Active Problem List   Diagnosis Date Noted   AKI (acute kidney injury) ([place])  07/10/2022   Hyponatremia 07/10/2022   Mild protein malnutrition ([place]) 07/10/2022   Thrombocytopenia ([place]) 07/10/2022   Acute urinary retention 07/10/2022   Normocytic anemia 07/10/2022   Orthostatic hypotension 06/08/2022   Stage 3b chronic kidney disease (CKD) ([place]) 06/08/2022   Dehydration    Callus of foot 06/09/2021   Acute renal failure superimposed on stage 3a chronic kidney disease ([place]) 10/27/2020   COPD with chronic bronchitis ([place]) 10/27/2020   Ingrown left big toenail 10/25/2020   Ingrown nail of great toe of right foot 10/25/2020   Community acquired bacterial pneumonia 11/25/2019   Sepsis ([place]) 11/25/2019   Hypothyroidism 11/25/2019   BPH (benign prostatic hyperplasia) 11/25/2019   Headache 11/25/2019   Tremors of nervous system 11/25/2019   Thrombocytosis 11/25/2019   Elevated bilirubin 11/25/2019   OSA (obstructive sleep apnea) 11/14/2017   Chronic pain syndrome 10/18/2015   Self-injurious behavior 10/18/2015   Suicidal ideation 10/18/2015   History of substance use 10/17/2015   Generalized abdominal pain 10/07/2015   Chronic idiopathic constipation 09/24/2015   Eczema 06/24/2015   Plantar fasciitis, left 11/19/2014   Chest pain- musculoskeletal 07/03/2014   Leg pain, bilateral 07/03/2014   Varicose veins 07/03/2014  Shoulder bursitis 07/02/2014   Cholelithiasis 01/14/2014   Cataract cortical, senile 01/09/2014   Meibomian gland dysfunction (MGD), bilateral, both upper and lower lids 01/09/2014   Pinguecula of both eyes 01/09/2014   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   Obesity (BMI 35.0-39.9 without comorbidity) 09/26/2013   Pituitary adenoma ([place]) 09/22/2013   Gout 09/18/2013   Allergic bronchitis 08/30/2013   Chronic cough 07/18/2013   Pruritic disorder 06/14/2012   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

## 2022-07-12 DIAGNOSIS — N179 Acute kidney failure, unspecified: Secondary | ICD-10-CM | POA: Diagnosis not present

## 2022-07-12 LAB — BASIC METABOLIC PANEL
Anion gap: 3 — ABNORMAL LOW (ref 5–15)
BUN: 24 mg/dL — ABNORMAL HIGH (ref 8–23)
CO2: 25 mmol/L (ref 22–32)
Calcium: 7.8 mg/dL — ABNORMAL LOW (ref 8.9–10.3)
Chloride: 114 mmol/L — ABNORMAL HIGH (ref 98–111)
Creatinine, Ser: 1.74 mg/dL — ABNORMAL HIGH (ref 0.61–1.24)
GFR, Estimated: 40 mL/min — ABNORMAL LOW (ref >60)
Glucose, Bld: 112 mg/dL — ABNORMAL HIGH (ref 70–99)
Potassium: 4.8 mmol/L (ref 3.5–5.1)
Sodium: 142 mmol/L (ref 135–145)

## 2022-07-12 LAB — CBC
HCT: 34.2 % — ABNORMAL LOW (ref 39.0–52.0)
Hemoglobin: 11.6 g/dL — ABNORMAL LOW (ref 13.0–17.0)
MCH: 31.3 pg (ref 26.0–34.0)
MCHC: 33.9 g/dL (ref 30.0–36.0)
MCV: 92.2 fL (ref 80.0–100.0)
Platelets: 173 10*3/uL (ref 150–400)
RBC: 3.71 MIL/uL — ABNORMAL LOW (ref 4.22–5.81)
RDW: 14.2 % (ref 11.5–15.5)
WBC: 8.6 10*3/uL (ref 4.0–10.5)
nRBC: 0 % (ref 0.0–0.2)

## 2022-07-12 MED ORDER — GABAPENTIN 100 MG PO CAPS
100.0000 mg | ORAL_CAPSULE | Freq: Every day | ORAL | Status: DC
  Administered 2022-07-12: 100 mg via ORAL
  Filled 2022-07-12: qty 1

## 2022-07-12 MED ORDER — FLUTICASONE FUROATE-VILANTEROL 200-25 MCG/ACT IN AEPB
1.0000 | INHALATION_SPRAY | Freq: Every day | RESPIRATORY_TRACT | Status: DC
  Administered 2022-07-12 – 2022-07-13 (×2): 1 via RESPIRATORY_TRACT
  Filled 2022-07-12: qty 28

## 2022-07-12 MED ORDER — GABAPENTIN 400 MG PO CAPS
400.0000 mg | ORAL_CAPSULE | Freq: Every day | ORAL | Status: DC
  Administered 2022-07-12: 400 mg via ORAL
  Filled 2022-07-12: qty 1

## 2022-07-12 MED ORDER — GUAIFENESIN 100 MG/5ML PO LIQD: 300.0000 mg | Freq: Four times a day (QID) | ORAL | Status: DC | PRN

## 2022-07-12 MED ORDER — HYDRALAZINE HCL 20 MG/ML IJ SOLN
10.0000 mg | INTRAMUSCULAR | Status: DC | PRN
Start: 2022-07-12 — End: 2022-07-13

## 2022-07-12 MED ORDER — BISACODYL 10 MG RE SUPP: 10.0000 mg | Freq: Every day | RECTAL | Status: DC | PRN

## 2022-07-12 MED ORDER — ONDANSETRON 4 MG PO TBDP: 4.0000 mg | ORAL_TABLET | Freq: Three times a day (TID) | ORAL | Status: DC | PRN

## 2022-07-12 MED ORDER — LOPERAMIDE HCL 2 MG PO CAPS: 4.0000 mg | ORAL_CAPSULE | ORAL | Status: DC | PRN

## 2022-07-12 MED ORDER — LACTULOSE 10 GM/15ML PO SOLN: 20.0000 g | Freq: Every day | ORAL | Status: DC | PRN

## 2022-07-12 MED ORDER — MAGNESIUM HYDROXIDE 400 MG/5ML PO SUSP: 30.0000 mL | Freq: Every day | ORAL | Status: DC | PRN

## 2022-07-12 MED ORDER — TIZANIDINE HCL 2 MG PO TABS
2.0000 mg | ORAL_TABLET | Freq: Every day | ORAL | Status: DC
  Administered 2022-07-12: 2 mg via ORAL
  Filled 2022-07-12: qty 1

## 2022-07-12 MED ORDER — MIRABEGRON ER 50 MG PO TB24: 50.0000 mg | ORAL_TABLET | Freq: Every day | ORAL | Status: DC

## 2022-07-12 MED ORDER — FENOFIBRATE 54 MG PO TABS
54.0000 mg | ORAL_TABLET | Freq: Every day | ORAL | Status: DC
  Administered 2022-07-12 – 2022-07-13 (×2): 54 mg via ORAL
  Filled 2022-07-12 (×2): qty 1

## 2022-07-12 MED ORDER — NORTRIPTYLINE HCL 25 MG PO CAPS
150.0000 mg | ORAL_CAPSULE | Freq: Every day | ORAL | Status: DC
  Administered 2022-07-12 – 2022-07-13 (×2): 150 mg via ORAL
  Filled 2022-07-12 (×2): qty 6

## 2022-07-12 NOTE — Progress Notes (Signed)
PROGRESS NOTE    Dakota Harper  VFI:[id number] DOB: 1946/08/07 DOA: 07/10/2022 PCP: [name], NP    Brief Narrative:  76 y.o. male with medical history significant of anxiety, depression, osteoarthritis, asthma, COPD, COVID-19, basal cell carcinoma, BPH, colon adenomas, dysplastic nevus, GERD, gout, headaches, cholelithiasis, hyperlipidemia, hypertension, hypothyroidism, peripheral neuropathy, history of pneumonia, sleep disorder.  He presented from the assisted living facility to the emergency room because of 2 to 3-day history of urinary retention associated with suprapubic tenderness, frequent urination and constipation.   He was found to have acute urinary retention, AKI and acute UTI.  Foley catheter placed in the emergency room drained about 1.7 L of urine.  He was treated with empiric IV ceftriaxone and IV fluids.   Assessment & Plan:   Principal Problem:   AKI (acute kidney injury) ([place]) Active Problems:   Acute urinary retention   Hypertension, benign   Stage 3b chronic kidney disease (CKD) (HCC)   Acute UTI   BPH (benign prostatic hyperplasia)   Hypothyroidism   COPD with chronic bronchitis (HCC)   Hypercholesterolemia   Hyponatremia   Mild protein malnutrition (HCC)   Thrombocytopenia (HCC)   Normocytic anemia  AKI on CKD stage IIIb Creatinine is trending down  Approaching baseline DC IV fluids for now Recheck kidney function in a.m.   Acute urinary retention in the setting of underlying BPH CT stone study negative for ureteral lithiasis or hydronephrosis Plan: Attempt voiding trial DC Foley catheter Continue finasteride and tamsulosin If patient fails voiding trial may need to replace Foley and discharge with outpatient urology follow-up for voiding trial   Acute UTI, ruled out Urinalysis inconsistent with infection Urine sterile Discontinue Rocephin   Hyponatremia and thrombocytopenia  Improved   Other comorbidities include COPD with  chronic bronchitis, hypertension, normocytic anemia   DVT prophylaxis: SCD Code Status: Full Family Communication: None today Disposition Plan: Status is: Inpatient Remains inpatient appropriate because: Acute urinary retention, AKI.  Possible discharge 9/7   Level of care: Med-Surg  Consultants:  None  Procedures:  Foley catheterization  Antimicrobials:   Subjective: Patient seen and examined.  Resting comfortably in bed.  No visible distress.  No complaint of pain.  Objective: Vitals:   07/11/22 1923 07/11/22 2259 07/12/22 0437 07/12/22 0853  BP: (!) 195/91 (!) 148/76 129/61 (!) 154/83  Pulse: 91 100 92 96  Resp: '20 20 16 16  '$ Temp: 98.3 F (36.8 C) 98.2 F (36.8 C) 98.3 F (36.8 C)   TempSrc: Oral Oral    SpO2: 98% 98% 96% 100%  Weight:      Height:        Intake/Output Summary (Last 24 hours) at 07/12/2022 1343 Last data filed at 07/12/2022 1005 Gross per 24 hour  Intake 460 ml  Output 1400 ml  Net -940 ml   Filed Weights   07/10/22 1021  Weight: 93 kg    Examination:  General exam: Appears calm and comfortable  Respiratory system: Clear to auscultation. Respiratory effort normal. Cardiovascular system: S1-S2, RRR, no murmurs, no pedal edema Gastrointestinal system: Soft, NT/ND, normal bowel sounds GU: Foley in place, draining clear yellow urine Central nervous system: Alert and oriented. No focal neurological deficits. Extremities: Symmetric 5 x 5 power. Skin: No rashes, lesions or ulcers Psychiatry: Judgement and insight appear normal. Mood & affect appropriate.     Data Reviewed: I have personally reviewed following labs and imaging studies  CBC: Recent Labs  Lab 07/06/22 0734 07/08/22 1640 07/10/22 1154 07/11/22  0408 07/12/22 0408  WBC 7.0 6.7 6.8 7.9 8.6  HGB 13.8 12.2* 11.1* 12.3* 11.6*  HCT 41.9 37.4* 33.6* 36.6* 34.2*  MCV 92.5 94.2 93.1 92.4 92.2  PLT 164 147* 126* 151 867   Basic Metabolic Panel: Recent Labs  Lab  07/06/22 0734 07/08/22 1748 07/08/22 2021 07/10/22 1154 07/11/22 0408 07/12/22 0408  NA 139 134* 132* 131* 138 142  K 4.4 4.6 4.6 5.0 4.8 4.8  CL 108 104 104 102 109 114*  CO2 24 22 19* 24 20* 25  GLUCOSE 141* 110* 119* 122* 117* 112*  BUN 29* 40* 40* 37* 30* 24*  CREATININE 1.48* 2.93* 2.84* 3.14* 2.43* 1.74*  CALCIUM 8.9 8.3* 7.8* 8.3* 8.0* 7.8*  MG 2.2  --   --   --   --   --    GFR: Estimated Creatinine Clearance: 39.9 mL/min (A) (by C-G formula based on SCr of 1.74 mg/dL (H)). Liver Function Tests: Recent Labs  Lab 07/10/22 1154 07/11/22 0408  AST 21 21  ALT 20 18  ALKPHOS 46 47  BILITOT 0.7 0.6  PROT 5.7* 5.5*  ALBUMIN 3.0* 2.8*   No results for input(s): "LIPASE", "AMYLASE" in the last 168 hours. No results for input(s): "AMMONIA" in the last 168 hours. Coagulation Profile: No results for input(s): "INR", "PROTIME" in the last 168 hours. Cardiac Enzymes: Recent Labs  Lab 07/10/22 1154  CKTOTAL 50   BNP (last 3 results) No results for input(s): "PROBNP" in the last 8760 hours. HbA1C: No results for input(s): "HGBA1C" in the last 72 hours. CBG: No results for input(s): "GLUCAP" in the last 168 hours. Lipid Profile: No results for input(s): "CHOL", "HDL", "LDLCALC", "TRIG", "CHOLHDL", "LDLDIRECT" in the last 72 hours. Thyroid Function Tests: No results for input(s): "TSH", "T4TOTAL", "FREET4", "T3FREE", "THYROIDAB" in the last 72 hours. Anemia Panel: No results for input(s): "VITAMINB12", "FOLATE", "FERRITIN", "TIBC", "IRON", "RETICCTPCT" in the last 72 hours. Sepsis Labs: Recent Labs  Lab 07/10/22 1154  LATICACIDVEN 1.3    Recent Results (from the past 240 hour(s))  SARS Coronavirus 2 by RT PCR (hospital order, performed in [facility] hospital lab) *cepheid single result test* Anterior Nasal Swab     Status: None   Collection Time: 07/08/22  5:16 PM   Specimen: Anterior Nasal Swab  Result Value Ref Range Status   SARS Coronavirus 2 by RT PCR  NEGATIVE NEGATIVE Final    Comment: (NOTE) SARS-CoV-2 target nucleic acids are NOT DETECTED.  The SARS-CoV-2 RNA is generally detectable in upper and lower respiratory specimens during the acute phase of infection. The lowest concentration of SARS-CoV-2 viral copies this assay can detect is 250 copies / mL. A negative result does not preclude SARS-CoV-2 infection and should not be used as the sole basis for treatment or other patient management decisions.  A negative result may occur with improper specimen collection / handling, submission of specimen other than nasopharyngeal swab, presence of viral mutation(s) within the areas targeted by this assay, and inadequate number of viral copies (<250 copies / mL). A negative result must be combined with clinical observations, patient history, and epidemiological information.  Fact Sheet for Patients:   https://www.patel.info/  Fact Sheet for Healthcare Providers: https://hall.com/  This test is not yet approved or  cleared by the [place] FDA and has been authorized for detection and/or diagnosis of SARS-CoV-2 by FDA under an Emergency Use Authorization (EUA).  This EUA will remain in effect (meaning this test can be used) for  the duration of the COVID-19 declaration under Section 564(b)(1) of the Act, 21 U.S.C. section 360bbb-3(b)(1), unless the authorization is terminated or revoked sooner.  Performed at [facility], [address], [place], [place] [address]   Urine Culture     Status: None   Collection Time: 07/10/22 10:52 AM   Specimen: Urine, Clean Catch  Result Value Ref Range Status   Specimen Description   Final    URINE, CLEAN CATCH Performed at [facility], [address] [place], [place] [address]    Special Requests   Final    NONE Performed at [facility], [address] [place], [place] [address]    Culture   Final    NO  GROWTH Performed at [facility], [place] [address] [place], [place] [address]    Report Status 07/11/2022 FINAL  Final         Radiology Studies: No results found.      Scheduled Meds:  aspirin EC  81 mg Oral QHS   atorvastatin  10 mg Oral QPM   fenofibrate  54 mg Oral Daily   finasteride  5 mg Oral Daily   fluticasone furoate-vilanterol  1 puff Inhalation Daily   gabapentin  100 mg Oral QHS   gabapentin  400 mg Oral QHS   levothyroxine  50 mcg Oral Q0600   montelukast  5 mg Oral QHS   nortriptyline  150 mg Oral Daily   paliperidone  3 mg Oral QHS   pantoprazole  40 mg Oral Daily   tamsulosin  0.4 mg Oral QHS   tiZANidine  2 mg Oral QHS   valproic acid  250 mg Oral BID   Continuous Infusions:  cefTRIAXone (ROCEPHIN)  IV 1 g (07/11/22 1159)     LOS: 1 day      Sidney Ace, MD [facility]   If 7PM-7AM, please contact night-coverage  07/12/2022, 1:43 PM

## 2022-07-12 NOTE — Progress Notes (Signed)
Mobility Specialist - Progress Note    07/12/22 1145  Mobility  Activity Ambulated with assistance in room  Level of Assistance Standby assist, set-up cues, supervision of patient - no hands on  Assistive Device Front wheel walker  Distance Ambulated (ft) 10 ft  Activity Response Tolerated well  $Mobility charge 1 Mobility   [place]ie [place] Mobility Specialist 07/12/22 11:46 AM

## 2022-07-12 NOTE — Plan of Care (Signed)

## 2022-07-12 NOTE — Plan of Care (Signed)

## 2022-07-13 DIAGNOSIS — N179 Acute kidney failure, unspecified: Secondary | ICD-10-CM | POA: Diagnosis not present

## 2022-07-13 LAB — BASIC METABOLIC PANEL
Anion gap: 4 — ABNORMAL LOW (ref 5–15)
BUN: 17 mg/dL (ref 8–23)
CO2: 26 mmol/L (ref 22–32)
Calcium: 8 mg/dL — ABNORMAL LOW (ref 8.9–10.3)
Chloride: 110 mmol/L (ref 98–111)
Creatinine, Ser: 1.45 mg/dL — ABNORMAL HIGH (ref 0.61–1.24)
GFR, Estimated: 50 mL/min — ABNORMAL LOW (ref >60)
Glucose, Bld: 85 mg/dL (ref 70–99)
Potassium: 3.9 mmol/L (ref 3.5–5.1)
Sodium: 140 mmol/L (ref 135–145)

## 2022-07-13 LAB — CBC WITH DIFFERENTIAL/PLATELET
Abs Immature Granulocytes: 0.28 10*3/uL — ABNORMAL HIGH (ref 0.00–0.07)
Basophils Absolute: 0.1 10*3/uL (ref 0.0–0.1)
Basophils Relative: 1 %
Eosinophils Absolute: 0.5 10*3/uL (ref 0.0–0.5)
Eosinophils Relative: 7 %
HCT: 33.8 % — ABNORMAL LOW (ref 39.0–52.0)
Hemoglobin: 11.2 g/dL — ABNORMAL LOW (ref 13.0–17.0)
Immature Granulocytes: 4 %
Lymphocytes Relative: 19 %
Lymphs Abs: 1.3 10*3/uL (ref 0.7–4.0)
MCH: 30.9 pg (ref 26.0–34.0)
MCHC: 33.1 g/dL (ref 30.0–36.0)
MCV: 93.1 fL (ref 80.0–100.0)
Monocytes Absolute: 0.5 10*3/uL (ref 0.1–1.0)
Monocytes Relative: 7 %
Neutro Abs: 4.5 10*3/uL (ref 1.7–7.7)
Neutrophils Relative %: 62 %
Platelets: 181 10*3/uL (ref 150–400)
RBC: 3.63 MIL/uL — ABNORMAL LOW (ref 4.22–5.81)
RDW: 14.5 % (ref 11.5–15.5)
WBC: 7.2 10*3/uL (ref 4.0–10.5)
nRBC: 0 % (ref 0.0–0.2)

## 2022-07-13 MED ORDER — AMLODIPINE BESYLATE 5 MG PO TABS: 5.0000 mg | ORAL_TABLET | Freq: Every day | ORAL | 0 refills | Status: DC

## 2022-07-13 MED ORDER — FLUDROCORTISONE ACETATE 0.1 MG PO TABS: 0.1000 mg | ORAL_TABLET | Freq: Every day | ORAL | 0 refills | Status: AC | PRN

## 2022-07-13 MED ORDER — SODIUM CHLORIDE 0.9 % IV BOLUS
500.0000 mL | Freq: Once | INTRAVENOUS | Status: AC
  Administered 2022-07-13: 500 mL via INTRAVENOUS

## 2022-07-13 NOTE — TOC Transition Note (Signed)
Transition of Care [facility]) - CM/SW Discharge Note   Patient Details  Name: Dakota Harper MRN: [id number] Date of Birth: 28-Dec-1945  Transition of Care [facility]) CM/SW Contact:  Beverly Sessions, RN Phone Number: 07/13/2022, 2:12 PM   Clinical Narrative:     Patient to dc today [facility] to arranged transport time with the bedside RN   Bedside RN to call report Meg with [facility] notified of discharge  DC summary and fl2 secure emailed to dustin at the [facility]         Patient Goals and CMS Choice        Discharge Placement                       Discharge Plan and Services                                     Social Determinants of Health (SDOH) Interventions     Readmission Risk Interventions     No data to display

## 2022-07-13 NOTE — Plan of Care (Signed)
  Problem: Pain Managment: Goal: General experience of comfort will improve Outcome: Progressing   Problem: Safety: Goal: Ability to remain free from injury will improve Outcome: Progressing   Problem: Skin Integrity: Goal: Risk for impaired skin integrity will decrease Outcome: Progressing   

## 2022-07-13 NOTE — Discharge Summary (Signed)
Physician Discharge Summary  Faye Sanfilippo Belding WGY:[id number] DOB: 1946-04-05 DOA: 07/10/2022  PCP: [name], NP  Admit date: 07/10/2022 Discharge date: 07/13/2022  Admitted From: ALF Disposition:  ALF  Recommendations for Outpatient Follow-up:  Follow up with PCP in 1-2 weeks Follow up 7-10 days with urology for voiding trial  Home Health:Yes PT OT RN Equipment/Devices: Foley (coude) with leg bag  Discharge Condition:Stable  CODE STATUS:FULL  Diet recommendation: Reg  Brief/Interim Summary: 76 y.o. male with medical history significant of anxiety, depression, osteoarthritis, asthma, COPD, COVID-19, basal cell carcinoma, BPH, colon adenomas, dysplastic nevus, GERD, gout, headaches, cholelithiasis, hyperlipidemia, hypertension, hypothyroidism, peripheral neuropathy, history of pneumonia, sleep disorder.  He presented from the assisted living facility to the emergency room because of 2 to 3-day history of urinary retention associated with suprapubic tenderness, frequent urination and constipation.   He was found to have acute urinary retention, AKI and acute UTI.  Foley catheter placed in the emergency room drained about 1.7 L of urine.  He was treated with empiric IV ceftriaxone and IV fluids.  Recovered from infection, AKI resolved.  Patient failed voiding trial.  Foley placed, blood tinged urine likely due to traumatic cath x 2.  Hb stable.  Will dc with Foley and leg bag.  Outpatient f/u with urology arranged.    Discharge Diagnoses:  Principal Problem:   AKI (acute kidney injury) ([place]) Active Problems:   Acute urinary retention   Hypertension, benign   Stage 3b chronic kidney disease (CKD) (HCC)   Acute UTI   BPH (benign prostatic hyperplasia)   Hypothyroidism   COPD with chronic bronchitis (HCC)   Hypercholesterolemia   Hyponatremia   Mild protein malnutrition (HCC)   Thrombocytopenia (HCC)   Normocytic anemia  AKI on CKD stage IIIb Creatinine is trending down   Approaching baseline DC with Foley catheter in place Outpatient PCP follow-up in 7 to 10 days for recheck of creatinine   Acute urinary retention in the setting of underlying BPH CT stone study negative for ureteral lithiasis or hydronephrosis Plan: Patient unfortunately failed his voiding trial.  Status post right catheterization x2.  Continue to retain.  Coud catheter placed by bedside RN on 9/7.  Discussed with [facility] [place].  Patient will discharge with Foley and leg bag.  Outpatient follow-up with urology in 7 to 10 days for voiding trial.  Can continue home Flomax and finasteride.    Acute UTI, ruled out Urinalysis inconsistent with infection Urine sterile.  No need for antibiotics   Hyponatremia and thrombocytopenia  Improved   Other comorbidities include COPD with chronic bronchitis, hypertension, normocytic anemia    Discharge Instructions  Discharge Instructions     Diet - low sodium heart healthy   Complete by: As directed    Increase activity slowly   Complete by: As directed       Allergies as of 07/13/2022       Reactions   Saw Palmetto Hives, Rash   "Berry form"   Other Other (See Comments), Rash   Sulfa Antibiotics Rash   Sulfacetamide Sodium Rash        Medication List     STOP taking these medications    diphenhydrAMINE 25 MG tablet Commonly known as: BENADRYL   meloxicam 15 MG tablet Commonly known as: MOBIC   mirabegron ER 50 MG Tb24 tablet Commonly known as: MYRBETRIQ   white petrolatum Gel Commonly known as: VASELINE       TAKE these medications  acetaminophen 500 MG tablet Commonly known as: TYLENOL Take 500 mg by mouth every 4 (four) hours as needed for moderate pain or mild pain.   albuterol 108 (90 Base) MCG/ACT inhaler Commonly known as: VENTOLIN HFA Inhale 1 puff into the lungs 3 (three) times daily as needed for wheezing or shortness of breath.   alum & mag hydroxide-simeth 200-200-20  MG/5ML suspension Commonly known as: MAALOX/MYLANTA Take 30 mLs by mouth 4 (four) times daily as needed for indigestion or heartburn.   amLODipine 5 MG tablet Commonly known as: NORVASC Take 1 tablet (5 mg total) by mouth daily.   aspirin EC 81 MG tablet Take 81 mg by mouth at bedtime. (2100)   atorvastatin 10 MG tablet Commonly known as: LIPITOR Take 10 mg by mouth every evening. (2000)   Breo Ellipta 200-25 MCG/ACT Aepb Generic drug: fluticasone furoate-vilanterol Inhale 1 puff into the lungs daily. (0800)   chlorhexidine 0.12 % solution Commonly known as: PERIDEX Use as directed 15 mLs in the mouth or throat 2 (two) times daily as needed (oral discomfort).   cycloSPORINE 0.05 % ophthalmic emulsion Commonly known as: RESTASIS Place 1 drop into both eyes 2 (two) times daily as needed (dry eyes).   diclofenac Sodium 1 % Gel Commonly known as: VOLTAREN Apply 2 g topically as needed (lower left leg pain.).   Ear Wax Removal Drops 6.5 % OTIC solution Generic drug: carbamide peroxide Place 5 drops into both ears every Sunday.   eucerin cream Apply 1 application topically in the morning and at bedtime. (0800 & 2000)   fenofibrate 54 MG tablet Take 1 tablet (54 mg total) by mouth daily.   finasteride 5 MG tablet Commonly known as: PROSCAR Take 1 tablet (5 mg total) by mouth daily.   fludrocortisone 0.1 MG tablet Commonly known as: FLORINEF Take 1 tablet (0.1 mg total) by mouth daily as needed. Take as needed for low blood pressure/dizziness What changed: See the new instructions.   Fluocinolone Acetonide Body 0.01 % Oil Apply to scalp 1-2 times a day and leave on.   fluocinonide ointment 0.05 % Commonly known as: LIDEX Apply 1 application topically 2 (two) times daily as needed (pruritus). (apply to legs)   gabapentin 400 MG capsule Commonly known as: NEURONTIN Take 400 mg by mouth at bedtime. Take along with one 100 mg capsule for total dose 500 mg daily at  bedtime   gabapentin 100 MG capsule Commonly known as: NEURONTIN Take 100 mg by mouth at bedtime. Take along with one 400 mg capsule for total dose 500 mg daily at bedtime   guaiFENesin 100 MG/5ML liquid Commonly known as: ROBITUSSIN Take 300 mg by mouth every 6 (six) hours as needed for cough.   ketoconazole 2 % cream Commonly known as: NIZORAL Apply 1 Application topically daily.   ketoconazole 2 % shampoo Commonly known as: NIZORAL Massage into scalp daily and let sit several minutes before rinsing.   lactulose 10 GM/15ML solution Commonly known as: CHRONULAC Take 30 mLs (20 g total) by mouth daily as needed for mild constipation.   levothyroxine 50 MCG tablet Commonly known as: SYNTHROID Take 50 mcg by mouth daily before breakfast. (0630)   loperamide 2 MG capsule Commonly known as: IMODIUM Take 4 mg by mouth as needed for diarrhea or loose stools.   magnesium hydroxide 400 MG/5ML suspension Commonly known as: MILK OF MAGNESIA Take 30 mLs by mouth daily as needed (constipation (no bowel movement for 3 days)).   montelukast 5  MG chewable tablet Commonly known as: SINGULAIR Chew 5 mg by mouth at bedtime. (2100)   nortriptyline 75 MG capsule Commonly known as: PAMELOR Take 150 mg by mouth daily. (0900)   omeprazole 40 MG capsule Commonly known as: PRILOSEC Take 1 capsule (40 mg total) by mouth daily.   ondansetron 4 MG disintegrating tablet Commonly known as: ZOFRAN-ODT Take 1 tablet (4 mg total) by mouth every 8 (eight) hours as needed for nausea or vomiting.   paliperidone 3 MG 24 hr tablet Commonly known as: INVEGA Take 3 mg by mouth at bedtime.   polyethylene glycol 17 g packet Commonly known as: MIRALAX / GLYCOLAX Take 17 g by mouth daily as needed (constipation.).   tamsulosin 0.4 MG Caps capsule Commonly known as: FLOMAX Take 0.4 mg by mouth at bedtime. (2100)   tiZANidine 2 MG tablet Commonly known as: ZANAFLEX Take 2 mg by mouth at bedtime.  (2000)   valproic acid 250 MG capsule Commonly known as: DEPAKENE Take 250 mg by mouth 2 (two) times daily.        Follow-up Information     Zara Council A, PA-C Follow up.   Specialty: Urology Why: Call to make appointment. Contact information: [place] [address] [phone]                Allergies  Allergen Reactions   Saw Palmetto Hives and Rash    "Berry form"   Other Other (See Comments) and Rash   Sulfa Antibiotics Rash   Sulfacetamide Sodium Rash    Consultations: None   Procedures/Studies: CT Renal Stone Study  Result Date: 07/10/2022 CLINICAL DATA:  Urinary retention. Neurogenic bladder dysfunction. Constipation. EXAM: CT ABDOMEN AND PELVIS WITHOUT CONTRAST TECHNIQUE: Multidetector CT imaging of the abdomen and pelvis was performed following the standard protocol without IV contrast. RADIATION DOSE REDUCTION: This exam was performed according to the departmental dose-optimization program which includes automated exposure control, adjustment of the mA and/or kV according to patient size and/or use of iterative reconstruction technique. COMPARISON:  06/08/2022 FINDINGS: Lower chest: No acute findings. Hepatobiliary: No mass visualized on this unenhanced exam. Prior cholecystectomy. No evidence of biliary obstruction. Pancreas: No mass or inflammatory process visualized on this unenhanced exam. Spleen:  Within normal limits in size. Adrenals/Urinary tract: No evidence of urolithiasis or hydronephrosis. Foley catheter is seen within the urinary bladder which is empty. Stomach/Bowel: No evidence of obstruction, inflammatory process, or abnormal fluid collections. Vascular/Lymphatic: No pathologically enlarged lymph nodes identified. No evidence of abdominal aortic aneurysm. Aortic atherosclerotic calcification incidentally noted. Reproductive:  Markedly enlarged prostate gland again noted. Other:  None. Musculoskeletal: No  suspicious bone lesions identified. 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 IMPRESSION: No evidence of urolithiasis, hydronephrosis, or other acute findings. Markedly enlarged prostate. Electronically Signed   By: Marlaine Hind M.D.   On: 07/10/2022 13:06   CT HEAD WO CONTRAST (5MM)  Result Date: 07/08/2022 CLINICAL DATA:  Head trauma, fall, persistent dizziness and hypotension EXAM: CT HEAD WITHOUT CONTRAST TECHNIQUE: Contiguous axial images were obtained from the base of the skull through the vertex without intravenous contrast. RADIATION DOSE REDUCTION: This exam was performed according to the departmental dose-optimization program which includes automated exposure control, adjustment of the mA and/or kV according to patient size and/or use of iterative reconstruction technique. COMPARISON:  06/07/2022 FINDINGS: Brain: No evidence of acute infarction, hemorrhage, hydrocephalus, extra-axial collection or mass lesion/mass effect. Vascular: No hyperdense vessel or unexpected calcification. Skull: Normal.  Negative for fracture or focal lesion. Sinuses/Orbits: No acute finding. Other: None. IMPRESSION: No acute intracranial pathology. Electronically Signed   By: Delanna Ahmadi M.D.   On: 07/08/2022 17:59   ECHOCARDIOGRAM COMPLETE  Result Date: 07/05/2022    ECHOCARDIOGRAM REPORT   Patient Name:   KAMONTE MCMICHEN Date of Exam: 07/05/2022 Medical Rec #:  [id number]        Height:       68.0 in Accession #:    [id number]       Weight:       205.0 lb Date of Birth:  10-08-1946       BSA:          2.065 m Patient Age:    23 years         BP:           153/76 mmHg Patient Gender: M                HR:           87 bpm. Exam Location:  [facility] Procedure: 2D Echo, Color Doppler and Cardiac Doppler Indications:     Syncope R55  History:         Patient has no prior history of Echocardiogram examinations.                  COPD; Risk Factors:Hypertension.  Sonographer:     Sherrie Sport Referring Phys:  [id number]  Jeanella Flattery AMIN Diagnosing Phys: Kate Sable MD  Sonographer Comments: Suboptimal apical window. IMPRESSIONS  1. Left ventricular ejection fraction, by estimation, is 55 to 60%. The left ventricle has normal function. The left ventricle has no regional wall motion abnormalities. There is mild left ventricular hypertrophy. Left ventricular diastolic parameters are consistent with Grade I diastolic dysfunction (impaired relaxation).  2. Right ventricular systolic function is normal. The right ventricular size is not well visualized.  3. The mitral valve is normal in structure. No evidence of mitral valve regurgitation.  4. The aortic valve was not well visualized. Aortic valve regurgitation is not visualized. FINDINGS  Left Ventricle: Left ventricular ejection fraction, by estimation, is 55 to 60%. The left ventricle has normal function. The left ventricle has no regional wall motion abnormalities. The left ventricular internal cavity size was normal in size. There is  mild left ventricular hypertrophy. Left ventricular diastolic parameters are consistent with Grade I diastolic dysfunction (impaired relaxation). Right Ventricle: The right ventricular size is not well visualized. No increase in right ventricular wall thickness. Right ventricular systolic function is normal. Left Atrium: Left atrial size was normal in size. Right Atrium: Right atrial size was normal in size. Pericardium: There is no evidence of pericardial effusion. Mitral Valve: The mitral valve is normal in structure. No evidence of mitral valve regurgitation. Tricuspid Valve: The tricuspid valve is normal in structure. Tricuspid valve regurgitation is not demonstrated. Aortic Valve: The aortic valve was not well visualized. Aortic valve regurgitation is not visualized. Aortic valve mean gradient measures 2.5 mmHg. Aortic valve peak gradient measures 4.4 mmHg. Aortic valve area, by VTI measures 3.13 cm. Pulmonic Valve: The pulmonic valve was  not well visualized. Pulmonic valve regurgitation is not visualized. Aorta: The aortic root is normal in size and structure. Venous: The inferior vena cava was not well visualized. IAS/Shunts: No atrial level shunt detected by color flow Doppler.  LEFT VENTRICLE PLAX 2D LVIDd:         4.70 cm   Diastology LVIDs:  3.30 cm   LV e' medial:    7.72 cm/s LV PW:         1.00 cm   LV E/e' medial:  10.9 LV IVS:        1.00 cm   LV e' lateral:   8.05 cm/s LVOT diam:     2.00 cm   LV E/e' lateral: 10.5 LV SV:         63 LV SV Index:   31 LVOT Area:     3.14 cm  RIGHT VENTRICLE RV S prime:     15.20 cm/s TAPSE (M-mode): 1.8 cm LEFT ATRIUM             Index        RIGHT ATRIUM           Index LA diam:        4.10 cm 1.99 cm/m   RA Area:     18.40 cm LA Vol (A2C):   58.8 ml 28.47 ml/m  RA Volume:   45.80 ml  22.18 ml/m LA Vol (A4C):   47.6 ml 23.05 ml/m LA Biplane Vol: 52.6 ml 25.47 ml/m  AORTIC VALVE AV Area (Vmax):    3.29 cm AV Area (Vmean):   2.98 cm AV Area (VTI):     3.13 cm AV Vmax:           105.00 cm/s AV Vmean:          74.850 cm/s AV VTI:            0.202 m AV Peak Grad:      4.4 mmHg AV Mean Grad:      2.5 mmHg LVOT Vmax:         110.00 cm/s LVOT Vmean:        70.900 cm/s LVOT VTI:          0.201 m LVOT/AV VTI ratio: 1.00  AORTA Ao Root diam: 3.20 cm MITRAL VALVE MV Area (PHT): 6.17 cm     SHUNTS MV Decel Time: 123 msec     Systemic VTI:  0.20 m MV E velocity: 84.40 cm/s   Systemic Diam: 2.00 cm MV A velocity: 120.00 cm/s MV E/A ratio:  0.70 Kate Sable MD Electronically signed by Kate Sable MD Signature Date/Time: 07/05/2022/4:13:15 PM    Final    DG Chest Port 1 View  Result Date: 07/04/2022 CLINICAL DATA:  Weakness. EXAM: PORTABLE CHEST 1 VIEW COMPARISON:  Chest x-ray 06/07/2022 FINDINGS: There is blunting of the left costophrenic angle with atelectasis in the left lung base. Cardiomediastinal silhouette is within normal limits. No pneumothorax or acute fracture. IMPRESSION: 1.  Small left pleural effusion with left basilar atelectasis. Electronically Signed   By: Ronney Asters M.D.   On: 07/04/2022 19:20      Subjective: Seen and examined on day of discharge.  Stable no distress.  Stable for discharge home.  Discharge Exam: Vitals:   07/13/22 0334 07/13/22 0744  BP: (!) 146/77 (!) 149/80  Pulse: 83 82  Resp: 20 16  Temp: (!) 97.4 F (36.3 C) (!) 97.5 F (36.4 C)  SpO2: 100% 100%   Vitals:   07/12/22 0853 07/12/22 1933 07/13/22 0334 07/13/22 0744  BP: (!) 154/83 (!) 154/79 (!) 146/77 (!) 149/80  Pulse: 96 96 83 82  Resp: '16 20 20 16  '$ Temp:  98.3 F (36.8 C) (!) 97.4 F (36.3 C) (!) 97.5 F (36.4 C)  TempSrc:  Oral Oral Oral  SpO2: 100%  100% 100% 100%  Weight:      Height:        General: Pt is alert, awake, not in acute distress Cardiovascular: RRR, S1/S2 +, no rubs, no gallops Respiratory: CTA bilaterally, no wheezing, no rhonchi Abdominal: Soft, NT, ND, bowel sounds + Extremities: no edema, no cyanosis    The results of significant diagnostics from this hospitalization (including imaging, microbiology, ancillary and laboratory) are listed below for reference.     Microbiology: Recent Results (from the past 240 hour(s))  SARS Coronavirus 2 by RT PCR (hospital order, performed in [facility] hospital lab) *cepheid single result test* Anterior Nasal Swab     Status: None   Collection Time: 07/08/22  5:16 PM   Specimen: Anterior Nasal Swab  Result Value Ref Range Status   SARS Coronavirus 2 by RT PCR NEGATIVE NEGATIVE Final    Comment: (NOTE) SARS-CoV-2 target nucleic acids are NOT DETECTED.  The SARS-CoV-2 RNA is generally detectable in upper and lower respiratory specimens during the acute phase of infection. The lowest concentration of SARS-CoV-2 viral copies this assay can detect is 250 copies / mL. A negative result does not preclude SARS-CoV-2 infection and should not be used as the sole basis for treatment or other patient  management decisions.  A negative result may occur with improper specimen collection / handling, submission of specimen other than nasopharyngeal swab, presence of viral mutation(s) within the areas targeted by this assay, and inadequate number of viral copies (<250 copies / mL). A negative result must be combined with clinical observations, patient history, and epidemiological information.  Fact Sheet for Patients:   https://www.patel.info/  Fact Sheet for Healthcare Providers: https://hall.com/  This test is not yet approved or  cleared by the [place] FDA and has been authorized for detection and/or diagnosis of SARS-CoV-2 by FDA under an Emergency Use Authorization (EUA).  This EUA will remain in effect (meaning this test can be used) for the duration of the COVID-19 declaration under Section 564(b)(1) of the Act, 21 U.S.C. section 360bbb-3(b)(1), unless the authorization is terminated or revoked sooner.  Performed at [facility], [address] [place], [place] [address]   Urine Culture     Status: None   Collection Time: 07/10/22 10:52 AM   Specimen: Urine, Clean Catch  Result Value Ref Range Status   Specimen Description   Final    URINE, CLEAN CATCH Performed at [facility], [address], [place], [place] [address]    Special Requests   Final    NONE Performed at [facility], [address], [place], [place] [address]    Culture   Final    NO GROWTH Performed at [facility], [place] [address], [place],  [address]    Report Status 07/11/2022 FINAL  Final     Labs: BNP (last 3 results) No results for input(s): "BNP" in the last 8760 hours. Basic Metabolic Panel: Recent Labs  Lab 07/08/22 2021 07/10/22 1154 07/11/22 0408 07/12/22 0408 07/13/22 0401  NA 132* 131* 138 142 140  K 4.6 5.0 4.8 4.8 3.9  CL 104 102 109 114* 110  CO2 19* 24 20* 25 26  GLUCOSE 119* 122*  117* 112* 85  BUN 40* 37* 30* 24* 17  CREATININE 2.84* 3.14* 2.43* 1.74* 1.45*  CALCIUM 7.8* 8.3* 8.0* 7.8* 8.0*   Liver Function Tests: Recent Labs  Lab 07/10/22 1154 07/11/22 0408  AST 21 21  ALT 20 18  ALKPHOS  46 47  BILITOT 0.7 0.6  PROT 5.7* 5.5*  ALBUMIN 3.0* 2.8*   No results for input(s): "LIPASE", "AMYLASE" in the last 168 hours. No results for input(s): "AMMONIA" in the last 168 hours. CBC: Recent Labs  Lab 07/08/22 1640 07/10/22 1154 07/11/22 0408 07/12/22 0408 07/13/22 0401  WBC 6.7 6.8 7.9 8.6 7.2  NEUTROABS  --   --   --   --  4.5  HGB 12.2* 11.1* 12.3* 11.6* 11.2*  HCT 37.4* 33.6* 36.6* 34.2* 33.8*  MCV 94.2 93.1 92.4 92.2 93.1  PLT 147* 126* 151 173 181   Cardiac Enzymes: Recent Labs  Lab 07/10/22 1154  CKTOTAL 50   BNP: Invalid input(s): "POCBNP" CBG: No results for input(s): "GLUCAP" in the last 168 hours. D-Dimer No results for input(s): "DDIMER" in the last 72 hours. Hgb A1c No results for input(s): "HGBA1C" in the last 72 hours. Lipid Profile No results for input(s): "CHOL", "HDL", "LDLCALC", "TRIG", "CHOLHDL", "LDLDIRECT" in the last 72 hours. Thyroid function studies No results for input(s): "TSH", "T4TOTAL", "T3FREE", "THYROIDAB" in the last 72 hours.  Invalid input(s): "FREET3" Anemia work up No results for input(s): "VITAMINB12", "FOLATE", "FERRITIN", "TIBC", "IRON", "RETICCTPCT" in the last 72 hours. Urinalysis    Component Value Date/Time   COLORURINE YELLOW (A) 07/10/2022 1052   APPEARANCEUR CLOUDY (A) 07/10/2022 1052   APPEARANCEUR Clear 09/17/2017 0905   LABSPEC 1.013 07/10/2022 1052   PHURINE 5.0 07/10/2022 1052   GLUCOSEU NEGATIVE 07/10/2022 1052   HGBUR LARGE (A) 07/10/2022 1052   BILIRUBINUR NEGATIVE 07/10/2022 1052   BILIRUBINUR Negative 09/17/2017 0905   KETONESUR NEGATIVE 07/10/2022 1052   PROTEINUR 30 (A) 07/10/2022 1052   NITRITE NEGATIVE 07/10/2022 1052   LEUKOCYTESUR NEGATIVE 07/10/2022 1052   Sepsis  Labs Recent Labs  Lab 07/10/22 1154 07/11/22 0408 07/12/22 0408 07/13/22 0401  WBC 6.8 7.9 8.6 7.2   Microbiology Recent Results (from the past 240 hour(s))  SARS Coronavirus 2 by RT PCR (hospital order, performed in [facility] hospital lab) *cepheid single result test* Anterior Nasal Swab     Status: None   Collection Time: 07/08/22  5:16 PM   Specimen: Anterior Nasal Swab  Result Value Ref Range Status   SARS Coronavirus 2 by RT PCR NEGATIVE NEGATIVE Final    Comment: (NOTE) SARS-CoV-2 target nucleic acids are NOT DETECTED.  The SARS-CoV-2 RNA is generally detectable in upper and lower respiratory specimens during the acute phase of infection. The lowest concentration of SARS-CoV-2 viral copies this assay can detect is 250 copies / mL. A negative result does not preclude SARS-CoV-2 infection and should not be used as the sole basis for treatment or other patient management decisions.  A negative result may occur with improper specimen collection / handling, submission of specimen other than nasopharyngeal swab, presence of viral mutation(s) within the areas targeted by this assay, and inadequate number of viral copies (<250 copies / mL). A negative result must be combined with clinical observations, patient history, and epidemiological information.  Fact Sheet for Patients:   https://www.patel.info/  Fact Sheet for Healthcare Providers: https://hall.com/  This test is not yet approved or  cleared by the [place] FDA and has been authorized for detection and/or diagnosis of SARS-CoV-2 by FDA under an Emergency Use Authorization (EUA).  This EUA will remain in effect (meaning this test can be used) for the duration of the COVID-19 declaration under Section 564(b)(1) of the Act, 21 U.S.C. section 360bbb-3(b)(1), unless the authorization is terminated or  revoked sooner.  Performed at [facility], [address] [place], [place] [address]   Urine Culture     Status: None   Collection Time: 07/10/22 10:52 AM   Specimen: Urine, Clean Catch  Result Value Ref Range Status   Specimen Description   Final    URINE, CLEAN CATCH Performed at [facility], [address] [place], [place] [address]    Special Requests   Final    NONE Performed at [facility], [address], [place], [place] [address]    Culture   Final    NO GROWTH Performed at [facility], [place] [address], [place], [place] [address]    Report Status 07/11/2022 FINAL  Final     Time coordinating discharge: Over 30 minutes  SIGNED:   Sidney Ace, MD  [facility] 07/13/2022, 1:38 PM Pager   If 7PM-7AM, please contact night-coverage

## 2022-07-13 NOTE — NC FL2 (Signed)
[place] LEVEL OF CARE SCREENING TOOL     IDENTIFICATION  Patient Name: Dakota Harper Birthdate: 1946-02-07 Sex: male Admission Date (Current Location): 07/10/2022  [facility] and [place] Number:  Engineering geologist and Address:         Provider Number: [id number]  Attending Physician Name and Address:  Sidney Ace, MD  Relative Name and Phone Number:       Current Level of Care: Hospital Recommended Level of Care: Assisted Living Facility Prior Approval Number:    Date Approved/Denied:   PASRR Number:    Discharge Plan: Other (Comment) (ALF)    Current Diagnoses: Patient Active Problem List   Diagnosis Date Noted   Acute UTI 07/11/2022   AKI (acute kidney injury) ([place]) 07/10/2022   Hyponatremia 07/10/2022   Mild protein malnutrition ([place]) 07/10/2022   Thrombocytopenia ([place]) 07/10/2022   Acute urinary retention 07/10/2022   Normocytic anemia 07/10/2022   Orthostatic hypotension 06/08/2022   Stage 3b chronic kidney disease (CKD) ([place]) 06/08/2022   Dehydration    Callus of foot 06/09/2021   Acute renal failure superimposed on stage 3a chronic kidney disease ([place]) 10/27/2020   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   Ingrown left big toenail 10/25/2020   Ingrown nail of great toe of right foot 10/25/2020   Community acquired bacterial pneumonia 11/25/2019   Sepsis ([place]) 11/25/2019   Hypothyroidism 11/25/2019   BPH (benign prostatic hyperplasia) 11/25/2019   Headache 11/25/2019   Tremors of nervous system 11/25/2019   Thrombocytosis 11/25/2019   Elevated bilirubin 11/25/2019   OSA (obstructive sleep apnea) 11/14/2017   Chronic pain syndrome 10/18/2015   Self-injurious behavior 10/18/2015   Suicidal ideation 10/18/2015   History of substance use 10/17/2015   Generalized abdominal pain 10/07/2015   Chronic idiopathic constipation 09/24/2015   Eczema 06/24/2015   Plantar fasciitis, left 11/19/2014   Chest pain- musculoskeletal  07/03/2014   Leg pain, bilateral 07/03/2014   Varicose veins 07/03/2014   Shoulder bursitis 07/02/2014   Cholelithiasis 01/14/2014   Cataract cortical, senile 01/09/2014   Meibomian gland dysfunction (MGD), bilateral, both upper and lower lids 01/09/2014   Pinguecula of both eyes 01/09/2014   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   Obesity (BMI 35.0-39.9 without comorbidity) 09/26/2013   Pituitary adenoma ([place]) 09/22/2013   Gout 09/18/2013   Allergic bronchitis 08/30/2013   Chronic cough 07/18/2013   Pruritic disorder 06/14/2012   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   Insomnia 09/02/2010   Recurrent depressive disorder ([place]) 01/30/2008   Nonorganic sleep disorder 01/30/2008   Hypercholesterolemia 09/03/2007   Hypertension, benign 09/03/2007    Orientation RESPIRATION BLADDER Height & Weight     Self, Time, Situation, Place  Normal Indwelling catheter Weight: 93 kg Height:  '5\' 8"'$  (172.7 cm)  BEHAVIORAL SYMPTOMS/MOOD NEUROLOGICAL BOWEL NUTRITION STATUS      Continent Diet (regular)  AMBULATORY STATUS COMMUNICATION OF NEEDS Skin   Supervision Verbally Normal                       Personal Care Assistance Level of Assistance    Bathing Assistance: Limited assistance Feeding assistance: Limited assistance Dressing Assistance: Limited assistance     Functional Limitations Info             SPECIAL CARE FACTORS FREQUENCY  PT (By licensed PT), OT (By licensed OT)     PT Frequency: enhabit OT Frequency: enhabit            Contractures Contractures  Info: Not present    Additional Factors Info  Code Status, Allergies Code Status Info: full Allergies Info: saw palmetto, sulfa antibiotic, sufacetamide sodium           Medication List       STOP taking these medications     diphenhydrAMINE 25 MG tablet Commonly known as: BENADRYL    meloxicam 15 MG tablet Commonly known as: MOBIC    mirabegron ER 50 MG Tb24 tablet Commonly known as: MYRBETRIQ     white petrolatum Gel Commonly known as: VASELINE           TAKE these medications     acetaminophen 500 MG tablet Commonly known as: TYLENOL Take 500 mg by mouth every 4 (four) hours as needed for moderate pain or mild pain.    albuterol 108 (90 Base) MCG/ACT inhaler Commonly known as: VENTOLIN HFA Inhale 1 puff into the lungs 3 (three) times daily as needed for wheezing or shortness of breath.    alum & mag hydroxide-simeth 200-200-20 MG/5ML suspension Commonly known as: MAALOX/MYLANTA Take 30 mLs by mouth 4 (four) times daily as needed for indigestion or heartburn.    amLODipine 5 MG tablet Commonly known as: NORVASC Take 1 tablet (5 mg total) by mouth daily.    aspirin EC 81 MG tablet Take 81 mg by mouth at bedtime. (2100)    atorvastatin 10 MG tablet Commonly known as: LIPITOR Take 10 mg by mouth every evening. (2000)    Breo Ellipta 200-25 MCG/ACT Aepb Generic drug: fluticasone furoate-vilanterol Inhale 1 puff into the lungs daily. (0800)    chlorhexidine 0.12 % solution Commonly known as: PERIDEX Use as directed 15 mLs in the mouth or throat 2 (two) times daily as needed (oral discomfort).    cycloSPORINE 0.05 % ophthalmic emulsion Commonly known as: RESTASIS Place 1 drop into both eyes 2 (two) times daily as needed (dry eyes).    diclofenac Sodium 1 % Gel Commonly known as: VOLTAREN Apply 2 g topically as needed (lower left leg pain.).    Ear Wax Removal Drops 6.5 % OTIC solution Generic drug: carbamide peroxide Place 5 drops into both ears every Sunday.    eucerin cream Apply 1 application topically in the morning and at bedtime. (0800 & 2000)    fenofibrate 54 MG tablet Take 1 tablet (54 mg total) by mouth daily.    finasteride 5 MG tablet Commonly known as: PROSCAR Take 1 tablet (5 mg total) by mouth daily.    fludrocortisone 0.1 MG tablet Commonly known as: FLORINEF Take 1 tablet (0.1 mg total) by mouth daily as needed. Take as needed for  low blood pressure/dizziness What changed: See the new instructions.    Fluocinolone Acetonide Body 0.01 % Oil Apply to scalp 1-2 times a day and leave on.    fluocinonide ointment 0.05 % Commonly known as: LIDEX Apply 1 application topically 2 (two) times daily as needed (pruritus). (apply to legs)    gabapentin 400 MG capsule Commonly known as: NEURONTIN Take 400 mg by mouth at bedtime. Take along with one 100 mg capsule for total dose 500 mg daily at bedtime    gabapentin 100 MG capsule Commonly known as: NEURONTIN Take 100 mg by mouth at bedtime. Take along with one 400 mg capsule for total dose 500 mg daily at bedtime    guaiFENesin 100 MG/5ML liquid Commonly known as: ROBITUSSIN Take 300 mg by mouth every 6 (six) hours as needed for cough.    ketoconazole  2 % cream Commonly known as: NIZORAL Apply 1 Application topically daily.    ketoconazole 2 % shampoo Commonly known as: NIZORAL Massage into scalp daily and let sit several minutes before rinsing.    lactulose 10 GM/15ML solution Commonly known as: CHRONULAC Take 30 mLs (20 g total) by mouth daily as needed for mild constipation.    levothyroxine 50 MCG tablet Commonly known as: SYNTHROID Take 50 mcg by mouth daily before breakfast. (0630)    loperamide 2 MG capsule Commonly known as: IMODIUM Take 4 mg by mouth as needed for diarrhea or loose stools.    magnesium hydroxide 400 MG/5ML suspension Commonly known as: MILK OF MAGNESIA Take 30 mLs by mouth daily as needed (constipation (no bowel movement for 3 days)).    montelukast 5 MG chewable tablet Commonly known as: SINGULAIR Chew 5 mg by mouth at bedtime. (2100)    nortriptyline 75 MG capsule Commonly known as: PAMELOR Take 150 mg by mouth daily. (0900)    omeprazole 40 MG capsule Commonly known as: PRILOSEC Take 1 capsule (40 mg total) by mouth daily.    ondansetron 4 MG disintegrating tablet Commonly known as: ZOFRAN-ODT Take 1 tablet (4 mg  total) by mouth every 8 (eight) hours as needed for nausea or vomiting.    paliperidone 3 MG 24 hr tablet Commonly known as: INVEGA Take 3 mg by mouth at bedtime.    polyethylene glycol 17 g packet Commonly known as: MIRALAX / GLYCOLAX Take 17 g by mouth daily as needed (constipation.).    tamsulosin 0.4 MG Caps capsule Commonly known as: FLOMAX Take 0.4 mg by mouth at bedtime. (2100)    tiZANidine 2 MG tablet Commonly known as: ZANAFLEX Take 2 mg by mouth at bedtime. (2000)    valproic acid 250 MG capsule Commonly known as: DEPAKENE Take 250 mg by mouth 2 (two) times daily.   Relevant Imaging Results:  Relevant Lab Results:   Additional Information    Beverly Sessions, RN

## 2022-07-13 NOTE — Progress Notes (Signed)
Mobility Specialist - Progress Note    07/13/22 1013  Mobility  Activity Ambulated with assistance in room;Ambulated with assistance in hallway  Level of Assistance Standby assist, set-up cues, supervision of patient - no hands on  Assistive Device Front wheel walker  Distance Ambulated (ft) 360 ft  Activity Response Tolerated well  $Mobility charge 1 Mobility   Pt supine upon entry, utilizing RA. Pt completed bed mob using HHA. Pt ambulated two laps around NS, using RW SBA. Pt left supine with needs within reach. No complaints.   Candie Mile Mobility Specialist 07/13/22 10:17 AM

## 2022-07-13 NOTE — Progress Notes (Signed)
Coude cath placed per Ralene Muskrat, MD. Urine pink-tinged. Ralene Muskrat, MD made aware and at bedside. See new orders.

## 2022-07-13 NOTE — Progress Notes (Addendum)
AVS given and reviewed with pt and placed in discharge packet. Coude in place and in working order. Urine leg bags sent with pt. Foley care discussed with pt. Pt anxious, but verbalized understanding. Report called and given to [place] at [facility]. All questions answered to satisfaction. Volunteer services escorted pt with all belongings to medical mall entrance where [facility] transportation was present to transport pt to facility.

## 2022-07-13 NOTE — Care Management Important Message (Signed)
Important Message  Patient Details  Name: BREYSON KELM MRN: [id number] Date of Birth: [birth year]   Medicare Important Message Given:  N/A - LOS <3 / Initial given by admissions     Dannette Barbara 07/13/2022, 2:15 PM

## 2022-07-17 ENCOUNTER — Encounter: Payer: Self-pay | Admitting: Podiatry

## 2022-07-17 ENCOUNTER — Ambulatory Visit (INDEPENDENT_AMBULATORY_CARE_PROVIDER_SITE_OTHER): Payer: 59 | Admitting: Podiatry

## 2022-07-17 DIAGNOSIS — M79675 Pain in left toe(s): Secondary | ICD-10-CM | POA: Diagnosis not present

## 2022-07-17 DIAGNOSIS — M79674 Pain in right toe(s): Secondary | ICD-10-CM

## 2022-07-17 DIAGNOSIS — B351 Tinea unguium: Secondary | ICD-10-CM | POA: Diagnosis not present

## 2022-07-17 NOTE — Progress Notes (Signed)
This patient returns to my office for at risk foot care.  This patient requires this care by a professional since this patient will be at risk due to having CKD and thrombocytopenia.  This patient is unable to cut nails himself since the patient cannot reach his nails.These nails are painful walking and wearing shoes.  This patient presents for at risk foot care today.  General Appearance  Alert, conversant and in no acute stress.  Vascular  Dorsalis pedis and posterior tibial  pulses are palpable  bilaterally.  Capillary return is within normal limits  bilaterally. Temperature is within normal limits  bilaterally.  Neurologic  Senn-Weinstein monofilament wire test within normal limits  bilaterally. Muscle power within normal limits bilaterally.  Nails Thick disfigured discolored nails with subungual debris  hallux nails bilaterally. No evidence of bacterial infection or drainage bilaterally.  Orthopedic  No limitations of motion  feet .  No crepitus or effusions noted.  No bony pathology or digital deformities noted.  Skin  normotropic skin with no porokeratosis noted bilaterally.  No signs of infections or ulcers noted.     Onychomycosis  Pain in right toes  Pain in left toes  Consent was obtained for treatment procedures.   Mechanical debridement of nails 1-5  bilaterally performed with a nail nipper.  Filed with dremel without incident.    Return office visit  3 months                   Told patient to return for periodic foot care and evaluation due to potential at risk complications.   Brenyn Petrey DPM   

## 2022-07-21 LAB — NM MYOCAR MULTI W/SPECT W/WALL MOTION / EF
Base ST Depression (mm): 0 mm
Estimated workload: 1
Exercise duration (min): 1 min
Exercise duration (sec): 9 s
LV dias vol: 73 mL (ref 62–150)
LV sys vol: 19 mL
MPHR: 144 {beats}/min
Nuc Stress EF: 74 %
Peak HR: 90 {beats}/min
Percent HR: 62 %
Rest HR: 83 {beats}/min
Rest Nuclear Isotope Dose: 10.5 mCi
SDS: 0
SRS: 11
SSS: 8
ST Depression (mm): 0 mm
Stress Nuclear Isotope Dose: 30.9 mCi
TID: 0.96

## 2022-07-24 NOTE — Progress Notes (Unsigned)
07/25/2022 8:34 AM   Dakota Harper [birth year] [id number]  Referring provider: [name], NP [address]. [address] [place],  [place] [address]  Urological history: 1. BPH w/ LU TS -~ 100 cc prostate on 07/2022 CT  -tamsulosin 0.4 mg daily and finasteride 5 mg daily   2. Nephrolithiasis -CT (03/2016) - left distal stone -CT (06/2016) - no stone or hydro   No chief complaint on file.   HPI: Dakota Harper is a 76 y.o. male who presents today for voiding trial.    He presented to the ED on 07/10/2022 complaining of urinary retention and constipation.  Foley catheter was placed in the ED and 1.7 L of urine was obtained.  He was admitted for AKI and hyponatremia.  He failed a voiding trial while in the hospital and experienced some gross heme w/ replacement of the Foley.  CT performed during the admission noted a markedly enlarged prostate gland.       PMH: Past Medical History:  Diagnosis Date   Anxiety    Arthritis    Asthma    Basal cell carcinoma 07/04/2021   L nasal ala. 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   R scapula, mod atypia   Ear pain    GERD (gastroesophageal reflux disease)    Gout    Headache    History of kidney stones    Hypercholesteremia    Hypertension    Hypothyroidism    Neuropathy    Pneumonia    Sleep disorder     Surgical History: Past Surgical History:  Procedure Laterality Date   ADJACENT TISSUE TRANSFER/TISSUE REARRANGEMENT Left 12/19/2021   Procedure: ADJACENT TISSUE TRANSFER/TISSUE REARRANGEMENT;  Surgeon: Cindra Presume, MD;  Location: [facility];  Service: Plastics;  Laterality: Left;   CHOLECYSTECTOMY     COLONOSCOPY     COLONOSCOPY WITH PROPOFOL N/A 08/01/2021   Procedure: COLONOSCOPY WITH PROPOFOL;  Surgeon: Jonathon Bellows, MD;  Location: [facility]  ENDOSCOPY;  Service: Gastroenterology;  Laterality: N/A;   ESOPHAGOGASTRODUODENOSCOPY (EGD) WITH PROPOFOL N/A 11/30/2021   Procedure: ESOPHAGOGASTRODUODENOSCOPY (EGD) WITH PROPOFOL;  Surgeon: Jonathon Bellows, MD;  Location: [facility] ENDOSCOPY;  Service: Gastroenterology;  Laterality: N/A;   HERNIA REPAIR     NASAL RECONSTRUCTION Left 12/19/2021   Procedure: NASAL RECONSTRUCTION;  Surgeon: Cindra Presume, MD;  Location: [facility];  Service: Plastics;  Laterality: Left;  2 hours   pituitary tumor excision      Home Medications:  Allergies as of 07/25/2022       Reactions   Saw Palmetto Hives, Rash   "Berry form"   Other Other (See Comments), Rash   Sulfa Antibiotics Rash   Sulfacetamide Sodium Rash        Medication List        Accurate as of July 24, 2022  8:34 AM. If you have any questions, ask your nurse or doctor.          acetaminophen 500 MG tablet Commonly known as: TYLENOL Take 500 mg by mouth every 4 (four) hours as needed for moderate pain or mild pain.   albuterol 108 (90 Base) MCG/ACT inhaler Commonly known as: VENTOLIN HFA Inhale 1 puff into the lungs 3 (three) times daily as needed for wheezing or shortness of breath.  alum & mag hydroxide-simeth 200-200-20 MG/5ML suspension Commonly known as: MAALOX/MYLANTA Take 30 mLs by mouth 4 (four) times daily as needed for indigestion or heartburn.   amLODipine 5 MG tablet Commonly known as: NORVASC Take 1 tablet (5 mg total) by mouth daily.   aspirin EC 81 MG tablet Take 81 mg by mouth at bedtime. (2100)   atorvastatin 10 MG tablet Commonly known as: LIPITOR Take 10 mg by mouth every evening. (2000)   Breo Ellipta 200-25 MCG/ACT Aepb Generic drug: fluticasone furoate-vilanterol Inhale 1 puff into the lungs daily. (0800)   chlorhexidine 0.12 % solution Commonly known as: PERIDEX Use as directed 15 mLs in the mouth or throat 2 (two) times daily as needed (oral discomfort).   cycloSPORINE 0.05 % ophthalmic  emulsion Commonly known as: RESTASIS Place 1 drop into both eyes 2 (two) times daily as needed (dry eyes).   diclofenac Sodium 1 % Gel Commonly known as: VOLTAREN Apply 2 g topically as needed (lower left leg pain.).   Ear Wax Removal Drops 6.5 % OTIC solution Generic drug: carbamide peroxide Place 5 drops into both ears every Sunday.   eucerin cream Apply 1 application topically in the morning and at bedtime. (0800 & 2000)   fenofibrate 54 MG tablet Take 1 tablet (54 mg total) by mouth daily.   finasteride 5 MG tablet Commonly known as: PROSCAR Take 1 tablet (5 mg total) by mouth daily.   fludrocortisone 0.1 MG tablet Commonly known as: FLORINEF Take 1 tablet (0.1 mg total) by mouth daily as needed. Take as needed for low blood pressure/dizziness   Fluocinolone Acetonide Body 0.01 % Oil Apply to scalp 1-2 times a day and leave on.   fluocinonide ointment 0.05 % Commonly known as: LIDEX Apply 1 application topically 2 (two) times daily as needed (pruritus). (apply to legs)   gabapentin 400 MG capsule Commonly known as: NEURONTIN Take 400 mg by mouth at bedtime. Take along with one 100 mg capsule for total dose 500 mg daily at bedtime   gabapentin 100 MG capsule Commonly known as: NEURONTIN Take 100 mg by mouth at bedtime. Take along with one 400 mg capsule for total dose 500 mg daily at bedtime   guaiFENesin 100 MG/5ML liquid Commonly known as: ROBITUSSIN Take 300 mg by mouth every 6 (six) hours as needed for cough.   ketoconazole 2 % cream Commonly known as: NIZORAL Apply 1 Application topically daily.   ketoconazole 2 % shampoo Commonly known as: NIZORAL Massage into scalp daily and let sit several minutes before rinsing.   lactulose 10 GM/15ML solution Commonly known as: CHRONULAC Take 30 mLs (20 g total) by mouth daily as needed for mild constipation.   levothyroxine 50 MCG tablet Commonly known as: SYNTHROID Take 50 mcg by mouth daily before breakfast.  (0630)   loperamide 2 MG capsule Commonly known as: IMODIUM Take 4 mg by mouth as needed for diarrhea or loose stools.   magnesium hydroxide 400 MG/5ML suspension Commonly known as: MILK OF MAGNESIA Take 30 mLs by mouth daily as needed (constipation (no bowel movement for 3 days)).   montelukast 5 MG chewable tablet Commonly known as: SINGULAIR Chew 5 mg by mouth at bedtime. (2100)   nortriptyline 75 MG capsule Commonly known as: PAMELOR Take 150 mg by mouth daily. (0900)   omeprazole 40 MG capsule Commonly known as: PRILOSEC Take 1 capsule (40 mg total) by mouth daily.   ondansetron 4 MG disintegrating tablet Commonly known as: ZOFRAN-ODT Take 1  tablet (4 mg total) by mouth every 8 (eight) hours as needed for nausea or vomiting.   paliperidone 3 MG 24 hr tablet Commonly known as: INVEGA Take 3 mg by mouth at bedtime.   polyethylene glycol 17 g packet Commonly known as: MIRALAX / GLYCOLAX Take 17 g by mouth daily as needed (constipation.).   tamsulosin 0.4 MG Caps capsule Commonly known as: FLOMAX Take 0.4 mg by mouth at bedtime. (2100)   tiZANidine 2 MG tablet Commonly known as: ZANAFLEX Take 2 mg by mouth at bedtime. (2000)   valproic acid 250 MG capsule Commonly known as: DEPAKENE Take 250 mg by mouth 2 (two) times daily.        Allergies:  Allergies  Allergen Reactions   Saw Palmetto Hives and Rash    "Berry form"   Other Other (See Comments) and Rash   Sulfa Antibiotics Rash   Sulfacetamide Sodium Rash    Family History: Family History  Problem Relation Age of Onset   Kidney cancer Mother    Heart attack Father    Alzheimer's disease Father    Heart attack Sister    Diabetes Mellitus II Sister    Benign prostatic hyperplasia Brother    Prostate cancer Neg Hx    Bladder Cancer Neg Hx     Social History:  reports that he has quit smoking. His smoking use included cigarettes. He has never used smokeless tobacco. He reports that he does not  drink alcohol and does not use drugs.  ROS: Pertinent ROS in HPI  Physical Exam: There were no vitals taken for this visit.  Constitutional:  Well nourished. Alert and oriented, No acute distress. HEENT: [place] AT, moist mucus membranes.  Trachea midline, no masses. Cardiovascular: No clubbing, cyanosis, or edema. Respiratory: Normal respiratory effort, no increased work of breathing. GI: Abdomen is soft, non tender, non distended, no abdominal masses. Liver and spleen not palpable.  No hernias appreciated.  Stool sample for occult testing is not indicated.   GU: No CVA tenderness.  No bladder fullness or masses.  Patient with circumcised/uncircumcised phallus. ***Foreskin easily retracted***  Urethral meatus is patent.  No penile discharge. No penile lesions or rashes. Scrotum without lesions, cysts, rashes and/or edema.  Testicles are located scrotally bilaterally. No masses are appreciated in the testicles. Left and right epididymis are normal. Rectal: Patient with  normal sphincter tone. Anus and perineum without scarring or rashes. No rectal masses are appreciated. Prostate is approximately *** grams, *** nodules are appreciated. Seminal vesicles are normal. Skin: No rashes, bruises or suspicious lesions. Lymph: No cervical or inguinal adenopathy. Neurologic: Grossly intact, no focal deficits, moving all 4 extremities. Psychiatric: Normal mood and affect.  Laboratory Data: Lab Results  Component Value Date   WBC 7.2 07/13/2022   HGB 11.2 (L) 07/13/2022   HCT 33.8 (L) 07/13/2022   MCV 93.1 07/13/2022   PLT 181 07/13/2022    Lab Results  Component Value Date   CREATININE 1.45 (H) 07/13/2022    Lab Results  Component Value Date   TSH 2.631 07/04/2022    Lab Results  Component Value Date   AST 21 07/11/2022   Lab Results  Component Value Date   ALT 18 07/11/2022    Urinalysis    Component Value Date/Time   COLORURINE YELLOW (A) 07/10/2022 1052   APPEARANCEUR CLOUDY  (A) 07/10/2022 1052   APPEARANCEUR Clear 09/17/2017 0905   LABSPEC 1.013 07/10/2022 1052   PHURINE 5.0 07/10/2022 1052   GLUCOSEU  NEGATIVE 07/10/2022 1052   HGBUR LARGE (A) 07/10/2022 1052   BILIRUBINUR NEGATIVE 07/10/2022 1052   BILIRUBINUR Negative 09/17/2017 0905   KETONESUR NEGATIVE 07/10/2022 1052   PROTEINUR 30 (A) 07/10/2022 1052   NITRITE NEGATIVE 07/10/2022 1052   LEUKOCYTESUR NEGATIVE 07/10/2022 1052  I have reviewed the labs.   Pertinent Imaging: *** Assessment & Plan:  ***  1. Urinary retention -likely secondary to massive BPH aggravated by constipation  -continue tamsulosin 0.4 mg daily and finasteride 5 mg daily  2. BPH with LUTS -PSA stable -DRE benign -UA benign -PVR < 300 cc -symptoms - *** -most bothersome symptoms are *** -continue conservative management, avoiding bladder irritants and timed voiding's -Initiate alpha-blocker (***), discussed side effects *** -Initiate 5 alpha reductase inhibitor (***), discussed side effects *** -Continue tamsulosin 0.4 mg daily, alfuzosin 10 mg daily, Rapaflo 8 mg daily, terazosin, doxazosin, Cialis 5 mg daily and finasteride 5 mg daily, dutasteride 0.5 mg daily***:refills given -Cannot tolerate medication or medication failure, schedule cystoscopy ***  3. Elevated PSA  -likely secondary to massive BPH -recheck once he has passed his voiding trial   4. Gross hematuria -likely secondary to massive BPH and Foley trauma -will reassess once he has passed his voiding trial      No follow-ups on file.  These notes generated with voice recognition software. I apologize for typographical errors.  [place], [place] [address]  [place], [place] [address] [phone]

## 2022-07-25 ENCOUNTER — Ambulatory Visit: Payer: 59 | Admitting: Urology

## 2022-07-25 ENCOUNTER — Ambulatory Visit (INDEPENDENT_AMBULATORY_CARE_PROVIDER_SITE_OTHER): Payer: 59 | Admitting: Urology

## 2022-07-25 ENCOUNTER — Encounter: Payer: Self-pay | Admitting: Urology

## 2022-07-25 DIAGNOSIS — R338 Other retention of urine: Secondary | ICD-10-CM

## 2022-07-25 DIAGNOSIS — R972 Elevated prostate specific antigen [PSA]: Secondary | ICD-10-CM

## 2022-07-25 DIAGNOSIS — N138 Other obstructive and reflux uropathy: Secondary | ICD-10-CM | POA: Diagnosis not present

## 2022-07-25 DIAGNOSIS — N401 Enlarged prostate with lower urinary tract symptoms: Secondary | ICD-10-CM | POA: Diagnosis not present

## 2022-07-25 DIAGNOSIS — R310 Gross hematuria: Secondary | ICD-10-CM | POA: Diagnosis not present

## 2022-07-25 LAB — BLADDER SCAN AMB NON-IMAGING

## 2022-07-25 NOTE — Progress Notes (Signed)
Catheter Removal  Patient is present today for a catheter removal.  31m of water was drained from the balloon. A 16FR coude foley cath was removed from the bladder, no complications were noted. Patient tolerated well.  Performed by: CBradly BienenstockCMA  Follow up/ Additional notes: RTC this afternoon for PVR.

## 2022-08-09 ENCOUNTER — Encounter: Payer: Self-pay | Admitting: Urology

## 2022-08-09 ENCOUNTER — Other Ambulatory Visit: Payer: Self-pay | Admitting: Urology

## 2022-08-09 ENCOUNTER — Ambulatory Visit (INDEPENDENT_AMBULATORY_CARE_PROVIDER_SITE_OTHER): Payer: 59 | Admitting: Urology

## 2022-08-09 VITALS — Ht 68.0 in

## 2022-08-09 DIAGNOSIS — N138 Other obstructive and reflux uropathy: Secondary | ICD-10-CM

## 2022-08-09 DIAGNOSIS — R338 Other retention of urine: Secondary | ICD-10-CM

## 2022-08-09 DIAGNOSIS — N401 Enlarged prostate with lower urinary tract symptoms: Secondary | ICD-10-CM | POA: Diagnosis not present

## 2022-08-09 MED ORDER — CEPHALEXIN 250 MG PO CAPS
500.0000 mg | ORAL_CAPSULE | Freq: Once | ORAL | Status: AC
  Administered 2022-08-09: 500 mg via ORAL

## 2022-08-09 NOTE — Progress Notes (Signed)
   08/09/22  CC:  Chief Complaint  Patient presents with   Cysto    HPI: 76 year old male with history of BPH with urinary retention and has failed voiding trials who presents today for cystoscopy.  He has an estimated prostate volume of 100 cc based on his previous CT scan.  He has been medically managed up to this point on Flomax and finasteride.  NED. A&Ox3.   No respiratory distress   Abd soft, NT, ND Normal phallus with bilateral descended testicles  Cystoscopy Procedure Note  Patient identification was confirmed, informed consent was obtained, and patient was prepped using Betadine solution.  Lidocaine jelly was administered per urethral meatus.     Pre-Procedure: - Inspection reveals a normal caliber ureteral meatus.  Procedure: The flexible cystoscope was introduced without difficulty - No urethral strictures/lesions are present. - Enlarged prostate with significant trilobar coaptation - Elevated bladder neck - Bilateral ureteral orifices identified - Bladder mucosa catheter cystitis on the posterior bladder wall with some debris - No bladder stones - Moderate to severe trabeculation  Retroflexion shows intravesical median lobe, moderate size   Post-Procedure: - Patient tolerated the procedure well  Assessment/ Plan:  1. BPH with urinary obstruction Refractory retention despite maximal medical management-he was replaced today with a 70 [place] following the procedure.  Cystoscopy consistent with massive BPH with chronic outlet obstruction  We discussed options including self cath, chronic indwelling Foley versus SP tube versus consideration of outlet procedure.  The size of his prostate, with no strongly recommend HoLEP.  We reviewed the surgery in detail today including the preoperative, intraoperative, and postoperative course.  This will most likely be an outpatient procedure pending the degree of post op hematuria.  He will go home with catheter for  a few days post op and will either be taught how to remove his own catheter or return to the office for catheter removal.  Risk of bleeding, infection, damage surrounding structures, injury to the bladder/ urethral, bladder neck contracture, ureteral stricture, retrograde ejaculation, stress/ urge incontinence, exacerbation of irritative voiding symptoms were all discussed in detail.    - Urinalysis, Complete - cephALEXin (KEFLEX) capsule 500 mg     Hollice Espy, MD

## 2022-08-09 NOTE — Progress Notes (Signed)
Catheter Exchange  Patient is present today for a catheter exchange.  54m of water was drained from the balloon. A 16FR foley cath was removed from the bladder, no complications were noted. Patient tolerated well.  Performed by: S.Geovanie Winnett  Follow up/ Additional notes: Cysto performed, new catheter placed after cysto.

## 2022-08-09 NOTE — Patient Instructions (Signed)

## 2022-08-09 NOTE — Progress Notes (Signed)
Surgical Physician Order Form [facility]   * Scheduling expectation : Next Available  *Length of Case:   *Clearance needed: no  *Anticoagulation Instructions: Hold all anticoagulants  *Aspirin Instructions: Hold Aspirin  *Post-op visit Date/Instructions:  1 week voiding trial  *Diagnosis: BPH w/urinary obstruction  *Procedure:  HoLEP     Additional orders: N/A  -Admit type: OUTpatient  -Anesthesia: General  -VTE Prophylaxis Standing Order SCD's       Other:   -Standing Lab Orders Per Anesthesia    Lab other: UA&Urine Culture  --> has foley.  Will need to come in for sample  -Standing Test orders EKG/Chest x-ray per Anesthesia       Test other:   - Medications:  Ancef 2gm IV  -Other orders:  N/A

## 2022-08-09 NOTE — H&P (View-Only) (Signed)
   08/09/22  CC:  Chief Complaint  Patient presents with   Cysto    HPI: 76 year old male with history of BPH with urinary retention and has failed voiding trials who presents today for cystoscopy.  He has an estimated prostate volume of 100 cc based on his previous CT scan.  He has been medically managed up to this point on Flomax and finasteride.  NED. A&Ox3.   No respiratory distress   Abd soft, NT, ND Normal phallus with bilateral descended testicles  Cystoscopy Procedure Note  Patient identification was confirmed, informed consent was obtained, and patient was prepped using Betadine solution.  Lidocaine jelly was administered per urethral meatus.     Pre-Procedure: - Inspection reveals a normal caliber ureteral meatus.  Procedure: The flexible cystoscope was introduced without difficulty - No urethral strictures/lesions are present. - Enlarged prostate with significant trilobar coaptation - Elevated bladder neck - Bilateral ureteral orifices identified - Bladder mucosa catheter cystitis on the posterior bladder wall with some debris - No bladder stones - Moderate to severe trabeculation  Retroflexion shows intravesical median lobe, moderate size   Post-Procedure: - Patient tolerated the procedure well  Assessment/ Plan:  1. BPH with urinary obstruction Refractory retention despite maximal medical management-he was replaced today with a 54 [place] following the procedure.  Cystoscopy consistent with massive BPH with chronic outlet obstruction  We discussed options including self cath, chronic indwelling Foley versus SP tube versus consideration of outlet procedure.  The size of his prostate, with no strongly recommend HoLEP.  We reviewed the surgery in detail today including the preoperative, intraoperative, and postoperative course.  This will most likely be an outpatient procedure pending the degree of post op hematuria.  He will go home with catheter for  a few days post op and will either be taught how to remove his own catheter or return to the office for catheter removal.  Risk of bleeding, infection, damage surrounding structures, injury to the bladder/ urethral, bladder neck contracture, ureteral stricture, retrograde ejaculation, stress/ urge incontinence, exacerbation of irritative voiding symptoms were all discussed in detail.    - Urinalysis, Complete - cephALEXin (KEFLEX) capsule 500 mg     Hollice Espy, MD

## 2022-08-10 LAB — URINALYSIS, COMPLETE
Bilirubin, UA: NEGATIVE
Glucose, UA: NEGATIVE
Ketones, UA: NEGATIVE
Nitrite, UA: NEGATIVE
Protein,UA: NEGATIVE
Specific Gravity, UA: 1.015 (ref 1.005–1.030)
Urobilinogen, Ur: 0.2 mg/dL (ref 0.2–1.0)
pH, UA: 7 (ref 5.0–7.5)

## 2022-08-10 LAB — MICROSCOPIC EXAMINATION: WBC, UA: >30 /hpf — AB (ref 0–5)

## 2022-08-11 ENCOUNTER — Telehealth: Payer: Self-pay | Admitting: Urology

## 2022-08-11 NOTE — Telephone Encounter (Signed)
I have tried to call patient multiple times, home phone is main number to a Assisted Living. I did leave a voicemail on his cell line to call the office to schedule surgery

## 2022-08-11 NOTE — Telephone Encounter (Signed)
Patient called the office today to inquire about his upcoming surgery date.  He is requesting a call back.

## 2022-08-23 ENCOUNTER — Telehealth: Payer: Self-pay

## 2022-08-23 NOTE — Telephone Encounter (Signed)
I spoke with Mr. Dakota Harper and the [place] . We have discussed possible surgery dates and Monday October 30th, 2023 was agreed upon by all parties. Patient given information about surgery date, what to expect pre-operatively and post operatively.  We discussed that a Pre-Admission Testing office will be calling to set up the pre-op visit that will take place prior to surgery, and that these appointments are typically done over the phone with a Pre-Admissions RN.  Informed patient that our office will communicate any additional care to be provided after surgery. Patients questions or concerns were discussed during our call. Advised to call our office should there be any additional information, questions or concerns that arise. Patient verbalized understanding.

## 2022-08-23 NOTE — Progress Notes (Signed)
[facility] Posting Form   Surgery Date/Time: Date: 10/ 30/2023  Surgeon: Dr. Hollice Espy, MD  Surgery Location: Day Surgery  Inpt ( No  )   Outpt (Yes)   Obs ( No  )   Diagnosis: N40.1, N13.8 Benign Prostatic Hyperplasia with Urinary Obstruction  -CPT: 16109  Surgery: Holmium Laser Enucleation of the Prostate  Stop Anticoagulations: Yes and hold ASA  Cardiac/Medical/Pulmonary Clearance needed: no  *Orders entered into EPIC  Date: 08/23/22   *Case booked in EPIC  Date: 08/17/2022  *Notified pt of Surgery: Date: 08/17/2022  PRE-OP UA & CX: yes, will come in for sample on 10/23 in clinic with Sam, has Foley  *Placed into Prior Authorization Work Que Date: 08/23/22   Assistant/laser/rep:No

## 2022-08-28 ENCOUNTER — Other Ambulatory Visit: Payer: Self-pay

## 2022-08-28 ENCOUNTER — Ambulatory Visit (INDEPENDENT_AMBULATORY_CARE_PROVIDER_SITE_OTHER): Payer: 59 | Admitting: Urology

## 2022-08-28 ENCOUNTER — Encounter
Admission: RE | Admit: 2022-08-28 | Discharge: 2022-08-28 | Disposition: A | Discharge status: Home / Self Care | Payer: 59 | Source: Ambulatory Visit | Attending: Urology | Admitting: Urology

## 2022-08-28 DIAGNOSIS — N138 Other obstructive and reflux uropathy: Secondary | ICD-10-CM

## 2022-08-28 DIAGNOSIS — N401 Enlarged prostate with lower urinary tract symptoms: Secondary | ICD-10-CM

## 2022-08-28 LAB — URINALYSIS, COMPLETE
Bilirubin, UA: NEGATIVE
Glucose, UA: NEGATIVE
Ketones, UA: NEGATIVE
Nitrite, UA: NEGATIVE
Specific Gravity, UA: 1.02 (ref 1.005–1.030)
Urobilinogen, Ur: 0.2 mg/dL (ref 0.2–1.0)
pH, UA: 5 (ref 5.0–7.5)

## 2022-08-28 LAB — MICROSCOPIC EXAMINATION: WBC, UA: >30 /hpf — AB (ref 0–5)

## 2022-08-28 NOTE — Patient Instructions (Addendum)
Your procedure is scheduled on: 09/04/22 Report to [facility]. To find out your arrival time please call [phone] between 1PM - 3PM on 09/01/22.  Remember: Instructions that are not followed completely may result in serious medical risk, up to and including death, or upon the discretion of your surgeon and anesthesiologist your surgery may need to be rescheduled.     _X__ 1. Do not eat food or drink any liquids after midnight the night before your procedure.                 No gum chewing or hard candies.   __X__2.  On the morning of surgery brush your teeth with toothpaste and water, you                 may rinse your mouth with mouthwash if you wish.  Do not swallow any              toothpaste of mouthwash.     _X__ 3.  No Alcohol for 24 hours before or after surgery.   _X__ 4.  Do Not Smoke or use e-cigarettes For 24 Hours Prior to Your Surgery.                 Do not use any chewable tobacco products for at least 6 hours prior to                 surgery.  ____  5.  Bring all medications with you on the day of surgery if instructed.   __X__  6.  Notify your doctor if there is any change in your medical condition      (cold, fever, infections).     Do not wear jewelry, make-up, hairpins, clips or nail polish. Do not wear lotions, powders, or perfumes. You may wear deodorant. Do not shave body hair 48 hours prior to surgery. Men may shave face and neck. Do not bring valuables to the hospital.    [facility] is not responsible for any belongings or valuables.  Contacts, dentures/partials or body piercings may not be worn into surgery. Bring a case for your contacts, glasses or hearing aids, a denture cup will be supplied. Leave your suitcase in the car. After surgery it may be brought to your room. For patients admitted to the hospital, discharge time is determined by your treatment team.   Patients discharged the day of  surgery will not be allowed to drive home.    __X__ Take these medicines the morning of surgery with A SIP OF WATER:    1. fenofibrate 54 MG tablet  2. finasteride (PROSCAR) 5 MG tablet  3. levothyroxine (SYNTHROID, LEVOTHROID) 50 MCG tablet  4. omeprazole (PRILOSEC) 40 MG capsule  5. valproic acid (DEPAKENE) 500 MG capsule  6. nortriptyline (PAMELOR) 150 MG capsule  7.   ____ Fleet Enema (as directed)   ____ Use CHG Soap/SAGE wipes as directed  __X__ Use inhalers on the day of surgery     fluticasone furoate-vilanterol (BREO ELLIPTA) 200-25 MCG/INH AEPB  ____ Stop metformin/Janumet/Farxiga 2 days prior to surgery    ____ Take 1/2 of usual insulin dose the night before surgery. No insulin the morning          of surgery.   ____ Stop Blood Thinners Coumadin/Plavix/Xarelto/Pleta/Pradaxa/Eliquis/Effient/Aspirin  on   Or contact your Surgeon, Cardiologist or Medical Doctor regarding  ability to stop your blood thinners  __X__ Stop  Anti-inflammatories 7 days before surgery such as Advil, Ibuprofen, Motrin,  BC or Goodies Powder, Naprosyn, Naproxen, Aleve, Aspirin    __X__ Stop all herbals and supplements, fish oil or vitamins  until after surgery.    ____ Bring C-Pap to the hospital.

## 2022-08-28 NOTE — Progress Notes (Signed)
Patient presents today for a UA, UCX for pre op. The catheter was plugged and water was given to the patient, we waited about 15 minutes and plug was removed from catheter and urine was collected. The night bag was reattached.

## 2022-08-31 ENCOUNTER — Telehealth: Payer: Self-pay

## 2022-08-31 LAB — CULTURE, URINE COMPREHENSIVE

## 2022-08-31 MED ORDER — AMOXICILLIN-POT CLAVULANATE 875-125 MG PO TABS: 1.0000 | ORAL_TABLET | Freq: Two times a day (BID) | ORAL | 0 refills | Status: DC

## 2022-08-31 NOTE — Telephone Encounter (Signed)
Spoke with pt. Pt. Advised that he needs to start medicine today, called in to [place] in [facility] per request. Patient verbalized understanding.

## 2022-08-31 NOTE — Telephone Encounter (Signed)
-----   Message from Hollice Espy, MD sent at 08/31/2022  8:10 AM EDT ----- Please treat with augmentin bid x 7 days starting 5 days prior to surgery  Hollice Espy, MD

## 2022-09-01 ENCOUNTER — Telehealth: Payer: Self-pay

## 2022-09-01 MED ORDER — CIPROFLOXACIN HCL 500 MG PO TABS: 500.0000 mg | ORAL_TABLET | Freq: Two times a day (BID) | ORAL | 0 refills | Status: DC

## 2022-09-01 NOTE — Telephone Encounter (Signed)
-----   Message from Hollice Espy, MD sent at 09/01/2022  9:22 AM EDT ----- The second bacteria grew in addition to the E. coli which is resistant to Augmentin.  Please change antibiotics to Cipro 500 mg twice a day x 7 days  Hollice Espy, MD

## 2022-09-01 NOTE — Telephone Encounter (Signed)
Pt aware.   Med [facility] per pt request. Aware to discontinue Augmentin. Pt voiced understanding.

## 2022-09-04 ENCOUNTER — Encounter
Admission: RE | Disposition: A | Discharge status: Skilled Nursing Facility | Payer: Self-pay | Source: Home / Self Care | Attending: Urology

## 2022-09-04 ENCOUNTER — Ambulatory Visit: Payer: 59

## 2022-09-04 ENCOUNTER — Encounter: Payer: Self-pay | Admitting: Urology

## 2022-09-04 ENCOUNTER — Ambulatory Visit
Admission: RE | Admit: 2022-09-04 | Discharge: 2022-09-04 | Disposition: A | Discharge status: Skilled Nursing Facility | Payer: 59 | Attending: Urology | Admitting: Urology

## 2022-09-04 ENCOUNTER — Other Ambulatory Visit: Payer: Self-pay

## 2022-09-04 ENCOUNTER — Ambulatory Visit: Payer: 59 | Admitting: Urgent Care

## 2022-09-04 DIAGNOSIS — N320 Bladder-neck obstruction: Secondary | ICD-10-CM | POA: Diagnosis not present

## 2022-09-04 DIAGNOSIS — E039 Hypothyroidism, unspecified: Secondary | ICD-10-CM | POA: Insufficient documentation

## 2022-09-04 DIAGNOSIS — F419 Anxiety disorder, unspecified: Secondary | ICD-10-CM | POA: Diagnosis not present

## 2022-09-04 DIAGNOSIS — I10 Essential (primary) hypertension: Secondary | ICD-10-CM | POA: Diagnosis not present

## 2022-09-04 DIAGNOSIS — Z87891 Personal history of nicotine dependence: Secondary | ICD-10-CM | POA: Diagnosis not present

## 2022-09-04 DIAGNOSIS — K219 Gastro-esophageal reflux disease without esophagitis: Secondary | ICD-10-CM | POA: Diagnosis not present

## 2022-09-04 DIAGNOSIS — R339 Retention of urine, unspecified: Secondary | ICD-10-CM | POA: Diagnosis not present

## 2022-09-04 DIAGNOSIS — J449 Chronic obstructive pulmonary disease, unspecified: Secondary | ICD-10-CM | POA: Insufficient documentation

## 2022-09-04 DIAGNOSIS — Z85828 Personal history of other malignant neoplasm of skin: Secondary | ICD-10-CM | POA: Insufficient documentation

## 2022-09-04 DIAGNOSIS — N138 Other obstructive and reflux uropathy: Secondary | ICD-10-CM

## 2022-09-04 DIAGNOSIS — R338 Other retention of urine: Secondary | ICD-10-CM | POA: Insufficient documentation

## 2022-09-04 DIAGNOSIS — F32A Depression, unspecified: Secondary | ICD-10-CM | POA: Diagnosis not present

## 2022-09-04 DIAGNOSIS — N401 Enlarged prostate with lower urinary tract symptoms: Secondary | ICD-10-CM | POA: Insufficient documentation

## 2022-09-04 HISTORY — PX: HOLEP-LASER ENUCLEATION OF THE PROSTATE WITH MORCELLATION: SHX6641

## 2022-09-04 SURGERY — ENUCLEATION, PROSTATE, USING LASER, WITH MORCELLATION
Anesthesia: General | Site: Prostate

## 2022-09-04 MED ORDER — SUCCINYLCHOLINE CHLORIDE 200 MG/10ML IV SOSY
PREFILLED_SYRINGE | INTRAVENOUS | Status: AC
  Filled 2022-09-04: qty 10

## 2022-09-04 MED ORDER — MIDAZOLAM HCL 2 MG/2ML IJ SOLN
INTRAMUSCULAR | Status: AC
  Filled 2022-09-04: qty 2

## 2022-09-04 MED ORDER — EPHEDRINE 5 MG/ML INJ
INTRAVENOUS | Status: AC
  Filled 2022-09-04: qty 5

## 2022-09-04 MED ORDER — FENTANYL CITRATE (PF) 100 MCG/2ML IJ SOLN
INTRAMUSCULAR | Status: DC | PRN
  Administered 2022-09-04: 25 ug via INTRAVENOUS
  Administered 2022-09-04: 50 ug via INTRAVENOUS
  Administered 2022-09-04: 25 ug via INTRAVENOUS

## 2022-09-04 MED ORDER — FUROSEMIDE 10 MG/ML IJ SOLN
INTRAMUSCULAR | Status: AC
  Filled 2022-09-04: qty 4

## 2022-09-04 MED ORDER — ONDANSETRON HCL 4 MG/2ML IJ SOLN: 4.0000 mg | Freq: Once | INTRAMUSCULAR | Status: DC | PRN

## 2022-09-04 MED ORDER — MIDAZOLAM HCL 2 MG/2ML IJ SOLN
INTRAMUSCULAR | Status: DC | PRN
  Administered 2022-09-04: 1 mg via INTRAVENOUS

## 2022-09-04 MED ORDER — CEFAZOLIN SODIUM-DEXTROSE 2-4 GM/100ML-% IV SOLN
2.0000 g | INTRAVENOUS | Status: AC
  Administered 2022-09-04: 2 g via INTRAVENOUS

## 2022-09-04 MED ORDER — SUCCINYLCHOLINE CHLORIDE 200 MG/10ML IV SOSY
PREFILLED_SYRINGE | INTRAVENOUS | Status: DC | PRN
  Administered 2022-09-04: 100 mg via INTRAVENOUS

## 2022-09-04 MED ORDER — SUGAMMADEX SODIUM 200 MG/2ML IV SOLN
INTRAVENOUS | Status: DC | PRN
  Administered 2022-09-04: 200 mg via INTRAVENOUS

## 2022-09-04 MED ORDER — DEXAMETHASONE SODIUM PHOSPHATE 10 MG/ML IJ SOLN
INTRAMUSCULAR | Status: AC
  Filled 2022-09-04: qty 1

## 2022-09-04 MED ORDER — LIDOCAINE HCL (CARDIAC) PF 100 MG/5ML IV SOSY
PREFILLED_SYRINGE | INTRAVENOUS | Status: DC | PRN
  Administered 2022-09-04: 100 mg via INTRAVENOUS

## 2022-09-04 MED ORDER — LACTATED RINGERS IV SOLN: INTRAVENOUS | Status: DC | PRN

## 2022-09-04 MED ORDER — ONDANSETRON HCL 4 MG/2ML IJ SOLN
INTRAMUSCULAR | Status: AC
  Filled 2022-09-04: qty 2

## 2022-09-04 MED ORDER — ORAL CARE MOUTH RINSE: 15.0000 mL | Freq: Once | OROMUCOSAL | Status: AC

## 2022-09-04 MED ORDER — LIDOCAINE HCL (PF) 2 % IJ SOLN
INTRAMUSCULAR | Status: AC
  Filled 2022-09-04: qty 5

## 2022-09-04 MED ORDER — LACTATED RINGERS IV SOLN: INTRAVENOUS | Status: DC

## 2022-09-04 MED ORDER — ONDANSETRON HCL 4 MG/2ML IJ SOLN
INTRAMUSCULAR | Status: DC | PRN
  Administered 2022-09-04: 4 mg via INTRAVENOUS

## 2022-09-04 MED ORDER — FENTANYL CITRATE (PF) 100 MCG/2ML IJ SOLN
INTRAMUSCULAR | Status: AC
  Filled 2022-09-04: qty 2

## 2022-09-04 MED ORDER — HYDROCODONE-ACETAMINOPHEN 5-325 MG PO TABS: 1.0000 | ORAL_TABLET | Freq: Four times a day (QID) | ORAL | 0 refills | Status: DC | PRN

## 2022-09-04 MED ORDER — FUROSEMIDE 10 MG/ML IJ SOLN
INTRAMUSCULAR | Status: DC | PRN
  Administered 2022-09-04: 10 mg via INTRAMUSCULAR

## 2022-09-04 MED ORDER — DEXAMETHASONE SODIUM PHOSPHATE 10 MG/ML IJ SOLN
INTRAMUSCULAR | Status: DC | PRN
  Administered 2022-09-04: 10 mg via INTRAVENOUS

## 2022-09-04 MED ORDER — EPHEDRINE SULFATE (PRESSORS) 50 MG/ML IJ SOLN
INTRAMUSCULAR | Status: DC | PRN
  Administered 2022-09-04: 5 mg via INTRAVENOUS
  Administered 2022-09-04: 2.5 mg via INTRAVENOUS
  Administered 2022-09-04: 5 mg via INTRAVENOUS

## 2022-09-04 MED ORDER — ROCURONIUM BROMIDE 10 MG/ML (PF) SYRINGE
PREFILLED_SYRINGE | INTRAVENOUS | Status: AC
  Filled 2022-09-04: qty 10

## 2022-09-04 MED ORDER — SODIUM CHLORIDE 0.9 % IR SOLN
Status: DC | PRN
  Administered 2022-09-04: 12000 mL
  Administered 2022-09-04: 1200 mL
  Administered 2022-09-04 (×2): 6000 mL
  Administered 2022-09-04 (×2): 3000 mL

## 2022-09-04 MED ORDER — ROCURONIUM BROMIDE 100 MG/10ML IV SOLN
INTRAVENOUS | Status: DC | PRN
  Administered 2022-09-04 (×2): 10 mg via INTRAVENOUS
  Administered 2022-09-04: 20 mg via INTRAVENOUS
  Administered 2022-09-04: 10 mg via INTRAVENOUS

## 2022-09-04 MED ORDER — CEFAZOLIN SODIUM-DEXTROSE 2-4 GM/100ML-% IV SOLN
INTRAVENOUS | Status: AC
  Filled 2022-09-04: qty 100

## 2022-09-04 MED ORDER — FENTANYL CITRATE (PF) 100 MCG/2ML IJ SOLN
25.0000 ug | INTRAMUSCULAR | Status: DC | PRN
  Administered 2022-09-04 (×4): 25 ug via INTRAVENOUS

## 2022-09-04 MED ORDER — CHLORHEXIDINE GLUCONATE 0.12 % MT SOLN
15.0000 mL | Freq: Once | OROMUCOSAL | Status: AC
  Administered 2022-09-04: 15 mL via OROMUCOSAL

## 2022-09-04 MED ORDER — PROPOFOL 10 MG/ML IV BOLUS
INTRAVENOUS | Status: DC | PRN
  Administered 2022-09-04: 150 mg via INTRAVENOUS

## 2022-09-04 MED ORDER — CHLORHEXIDINE GLUCONATE 0.12 % MT SOLN
OROMUCOSAL | Status: AC
  Filled 2022-09-04: qty 15

## 2022-09-04 MED ORDER — PROPOFOL 10 MG/ML IV BOLUS
INTRAVENOUS | Status: AC
  Filled 2022-09-04: qty 40

## 2022-09-04 MED ORDER — PHENYLEPHRINE HCL (PRESSORS) 10 MG/ML IV SOLN
INTRAVENOUS | Status: DC | PRN
  Administered 2022-09-04: 80 ug via INTRAVENOUS

## 2022-09-04 SURGICAL SUPPLY — 38 items
ADAPTER IRRIG TUBE 2 SPIKE SOL (ADAPTER) ×2 IMPLANT
ADPR TBG 2 SPK PMP STRL ASCP (ADAPTER) ×2
BAG DRN LRG CPC RND TRDRP CNTR (MISCELLANEOUS)
BAG DRN RND TRDRP ANRFLXCHMBR (UROLOGICAL SUPPLIES)
BAG URINE DRAIN 2000ML AR STRL (UROLOGICAL SUPPLIES) IMPLANT
BAG URO DRAIN 4000ML (MISCELLANEOUS) IMPLANT
CATH FOL 2WAY LX 20X30 (CATHETERS) IMPLANT
CATH FOL 2WAY LX 22X30 (CATHETERS) IMPLANT
CATH FOLEY 3WAY 30CC 22FR (CATHETERS) IMPLANT
CATH URETL OPEN END 4X70 (CATHETERS) ×1 IMPLANT
CONTAINER COLLECT MORCELLATR (MISCELLANEOUS) ×1 IMPLANT
DRAPE 3/4 80X56 (DRAPES) ×1 IMPLANT
DRAPE UTILITY 15X26 TOWEL STRL (DRAPES) IMPLANT
FIBER LASER MOSES 550 DFL (Laser) ×1 IMPLANT
FILTER OVERFLOW MORCELLATOR (FILTER) ×1 IMPLANT
GAUZE 4X4 16PLY ~~LOC~~+RFID DBL (SPONGE) ×2 IMPLANT
GLOVE BIO SURGEON STRL SZ 6.5 (GLOVE) ×2 IMPLANT
GOWN STRL REUS W/ TWL LRG LVL3 (GOWN DISPOSABLE) ×2 IMPLANT
GOWN STRL REUS W/TWL LRG LVL3 (GOWN DISPOSABLE) ×2
HOLDER FOLEY CATH W/STRAP (MISCELLANEOUS) ×1 IMPLANT
IV NS IRRIG 3000ML ARTHROMATIC (IV SOLUTION) ×4 IMPLANT
KIT TURNOVER CYSTO (KITS) ×1 IMPLANT
MBRN O SEALING YLW 17 FOR INST (MISCELLANEOUS) ×1
MEMBRANE SLNG YLW 17 FOR INST (MISCELLANEOUS) ×1 IMPLANT
MORCELLATOR COLLECT CONTAINER (MISCELLANEOUS) ×1
MORCELLATOR OVERFLOW FILTER (FILTER) ×1
MORCELLATOR ROTATION 4.75 335 (MISCELLANEOUS) ×1 IMPLANT
PACK CYSTO AR (MISCELLANEOUS) ×1 IMPLANT
SET CYSTO W/LG BORE CLAMP LF (SET/KITS/TRAYS/PACK) IMPLANT
SET IRRIG Y TYPE TUR BLADDER L (SET/KITS/TRAYS/PACK) ×1 IMPLANT
SLEEVE PROTECTION STRL DISP (MISCELLANEOUS) ×2 IMPLANT
SURGILUBE 2OZ TUBE FLIPTOP (MISCELLANEOUS) ×1 IMPLANT
SYR TOOMEY IRRIG 70ML (MISCELLANEOUS) ×1
SYRINGE TOOMEY IRRIG 70ML (MISCELLANEOUS) ×1 IMPLANT
TRAP FLUID SMOKE EVACUATOR (MISCELLANEOUS) ×1 IMPLANT
TUBE PUMP MORCELLATOR PIRANHA (TUBING) ×1 IMPLANT
WATER STERILE IRR 1000ML POUR (IV SOLUTION) ×1 IMPLANT
WATER STERILE IRR 500ML POUR (IV SOLUTION) ×1 IMPLANT

## 2022-09-04 NOTE — Anesthesia Procedure Notes (Signed)
Procedure Name: Intubation Date/Time: 09/04/2022 8:46 AM  Performed by: Fredderick Phenix, CRNAPre-anesthesia Checklist: Patient identified, Emergency Drugs available, Suction available and Patient being monitored Patient Re-evaluated:Patient Re-evaluated prior to induction Oxygen Delivery Method: Circle system utilized Preoxygenation: Pre-oxygenation with 100% oxygen Induction Type: IV induction Ventilation: Mask ventilation without difficulty Laryngoscope Size: Miller and 2 Grade View: Grade I Tube type: Oral Tube size: 7.5 mm Number of attempts: 1 Airway Equipment and Method: Stylet and Oral airway Placement Confirmation: ETT inserted through vocal cords under direct vision, positive ETCO2 and breath sounds checked- equal and bilateral Secured at: 24 cm Tube secured with: Tape Dental Injury: Teeth and Oropharynx as per pre-operative assessment

## 2022-09-04 NOTE — Transfer of Care (Signed)
Immediate Anesthesia Transfer of Care Note  Patient: Dakota Harper  Procedure(s) Performed: HOLEP-LASER ENUCLEATION OF THE PROSTATE WITH MORCELLATION (Prostate)  Patient Location: PACU  Anesthesia Type:General  Level of Consciousness: drowsy  Airway & Oxygen Therapy: Patient Spontanous Breathing  Post-op Assessment: Report given to RN  Post vital signs: Reviewed and stable  Last Vitals:  Vitals Value Taken Time  BP 143/77 09/04/22 1102  Temp 36.1 C 09/04/22 1102  Pulse 72 09/04/22 1105  Resp 11 09/04/22 1105  SpO2 100 % 09/04/22 1105  Vitals shown include unvalidated device data.  Last Pain:  Vitals:   09/04/22 0726  TempSrc: Oral  PainSc: 0-No pain         Complications: No notable events documented.

## 2022-09-04 NOTE — Anesthesia Preprocedure Evaluation (Addendum)
Anesthesia Evaluation  Patient identified by MRN, date of birth, ID band Patient awake    Reviewed: Allergy & Precautions, H&P , NPO status , Patient's Chart, lab work & pertinent test results, reviewed documented beta blocker date and time   History of Anesthesia Complications Negative for: history of anesthetic complications  Airway Mallampati: III  TM Distance: <3 FB Neck ROM: full    Dental  (+) Poor Dentition, Chipped, Missing, Edentulous Upper   Pulmonary shortness of breath and with exertion, asthma , sleep apnea , pneumonia, resolved, COPD,  COPD inhaler, former smoker,    + rhonchi  + decreased breath sounds      Cardiovascular Exercise Tolerance: Good hypertension, On Medications negative cardio ROS Normal cardiovascular exam Rate:Normal     Neuro/Psych  Headaches, PSYCHIATRIC DISORDERS Anxiety Depression    GI/Hepatic Neg liver ROS, GERD  Medicated,  Endo/Other  Hypothyroidism   Renal/GU CRFRenal disease  negative genitourinary   Musculoskeletal   Abdominal   Peds  Hematology negative hematology ROS (+) Blood dyscrasia, anemia ,   Anesthesia Other Findings Past Medical History: No date: Anxiety No date: Arthritis No date: Asthma 07/04/2021: Basal cell carcinoma     Comment:  L nasal ala. MOHS completed 12/13/2021 No date: BPH (benign prostatic hyperplasia) No date: Colon adenomas No date: COPD (chronic obstructive pulmonary disease) ([place]) No date: COVID No date: Depression     Comment:  in the 1960's 02/28/2021: Dysplastic nevus     Comment:  R scapula, mod atypia No date: Ear pain No date: GERD (gastroesophageal reflux disease) No date: Gout No date: Headache No date: History of kidney stones No date: Hypercholesteremia No date: Hypertension No date: Hypothyroidism No date: Neuropathy No date: Pneumonia No date: Sleep disorder  Past Surgical History: 12/19/2021: ADJACENT TISSUE  TRANSFER/TISSUE REARRANGEMENT; Left     Comment:  Procedure: ADJACENT TISSUE TRANSFER/TISSUE               REARRANGEMENT;  Surgeon: Cindra Presume, MD;  Location:               [facility];  Service: Plastics;  Laterality: Left; No date: CHOLECYSTECTOMY No date: COLONOSCOPY 08/01/2021: COLONOSCOPY WITH PROPOFOL; N/A     Comment:  Procedure: COLONOSCOPY WITH PROPOFOL;  Surgeon: Jonathon Bellows, MD;  Location: [facility] ENDOSCOPY;  Service:               Gastroenterology;  Laterality: N/A; 11/30/2021: ESOPHAGOGASTRODUODENOSCOPY (EGD) WITH PROPOFOL; N/A     Comment:  Procedure: ESOPHAGOGASTRODUODENOSCOPY (EGD) WITH               PROPOFOL;  Surgeon: Jonathon Bellows, MD;  Location: [facility]               ENDOSCOPY;  Service: Gastroenterology;  Laterality: N/A; No date: HERNIA REPAIR 12/19/2021: NASAL RECONSTRUCTION; Left     Comment:  Procedure: NASAL RECONSTRUCTION;  Surgeon: Cindra Presume, MD;  Location: [facility];  Service: Plastics;  Laterality:              Left;  2 hours 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  BMI    Body Mass Index: 32.08 kg/m      Reproductive/Obstetrics negative OB ROS  Anesthesia Physical Anesthesia Plan  ASA: 3  Anesthesia Plan: General LMA   Post-op Pain Management:    Induction: Intravenous  PONV Risk Score and Plan: 3 and Ondansetron, Dexamethasone, Midazolam and Treatment may vary due to age or medical condition  Airway Management Planned: Oral ETT  Additional Equipment:   Intra-op Plan:   Post-operative Plan: Extubation in OR  Informed Consent: I have reviewed the patients History and Physical, chart, labs and discussed the procedure including the risks, benefits and alternatives for the proposed anesthesia with the patient or authorized representative who has indicated his/her understanding and acceptance.     Dental Advisory Given  Plan Discussed with: CRNA  Anesthesia Plan Comments:  (Patient consented for risks of anesthesia including but not limited to:  - adverse reactions to medications - damage to eyes, teeth, lips or other oral mucosa - nerve damage due to positioning  - sore throat or hoarseness - Damage to heart, brain, nerves, lungs, other parts of body or loss of life  Patient voiced understanding.)       Anesthesia Quick Evaluation

## 2022-09-04 NOTE — Anesthesia Postprocedure Evaluation (Signed)
Anesthesia Post Note  Patient: Dakota Harper  Procedure(s) Performed: HOLEP-LASER ENUCLEATION OF THE PROSTATE WITH MORCELLATION (Prostate)  Patient location during evaluation: PACU Anesthesia Type: General Level of consciousness: awake and alert Pain management: pain level controlled Vital Signs Assessment: post-procedure vital signs reviewed and stable Respiratory status: spontaneous breathing, nonlabored ventilation, respiratory function stable and patient connected to nasal cannula oxygen Cardiovascular status: blood pressure returned to baseline and stable Postop Assessment: no apparent nausea or vomiting Anesthetic complications: no   No notable events documented.   Last Vitals:  Vitals:   09/04/22 1155 09/04/22 1200  BP:  120/66  Pulse: 69 70  Resp: (!) 8 10  Temp:  (!) 36 C  SpO2: 92% 92%    Last Pain:  Vitals:   09/04/22 1145  TempSrc:   PainSc: 3                  Precious Haws Yuan Gann

## 2022-09-04 NOTE — Discharge Instructions (Addendum)
Holmium Laser Enucleation of the Prostate (HoLEP)  HoLEP is a treatment for men with benign prostatic hyperplasia (BPH). The laser surgery removed blockages of urine flow, and is done without any incisions on the body.     What is HoLEP?  HoLEP is a type of laser surgery used to treat obstruction (blockage) of urine flow as a result of benign prostatic hyperplasia (BPH). In men with BPH, the prostate gland is not cancerous, but has become enlarged. An enlarged prostate can result in a number of urinary tract symptoms such as weak urinary stream, difficulty in starting urination, inability to urinate, frequent urination, or getting up at night to urinate.  HoLEP was developed in the 1990's as a more effective and less expensive surgical option for BPH, compared to other surgical options such as laser vaporization(PVP/greenlight laser), transurethral resection of the prostate(TURP), and open simple prostatectomy.   What happens during a HoLEP?  HoLEP requires general anesthesia ("asleep" throughout the procedure).   An antibiotic is given to reduce the risk of infection  A surgical instrument called a resectoscope is inserted through the urethra (the tube that carries urine from the bladder). The resectoscope has a camera that allows the surgeon to view the internal structure of the prostate gland, and to see where the incisions are being made during surgery.  The laser is inserted into the resectoscope and is used to enucleate (free up) the enlarged prostate tissue from the capsule (outer shell) and then to seal up any blood vessels. The tissue that has been removed is pushed back into the bladder.  A morcellator is placed through the resectoscope, and is used to suction out the prostate tissue that has been pushed into the bladder.  When the prostate tissue has been removed, the resectoscope is removed, and a foley catheter is placed to allow healing and drain the urine from the  bladder.     What happens after a HoLEP?  More than 90% of patients go home the same day a few hours after surgery. Less than 10% will be admitted to the hospital overnight for observation to monitor the urine, or if they have other medical problems.  Fluid is flushed through the catheter for about 1 hour after surgery to clear any blood from the urine. It is normal to have some blood in the urine after surgery. The need for blood transfusion is extremely rare.  Eating and drinking are permitted after the procedure once the patient has fully awakened from anesthesia.  The catheter is usually removed 2-3 days after surgery- the patient will come to clinic to have the catheter removed and make sure they can urinate on their own.  It is very important to drink lots of fluids after surgery for one week to keep the bladder flushed.  At first, there may be some burning with urination, but this typically improved within a few hours to days. Most patients do not have a significant amount of pain, and narcotic pain medications are rarely needed.  Symptoms of urinary frequency, urgency, and even leakage are NORMAL for the first few weeks after surgery as the bladder adjusts after having to work hard against blockage from the prostate for many years. This will improve, but can sometimes take several months.  The use of pelvic floor exercises (Kegel exercises) can help improve problems with urinary incontinence.   After catheter removal, patients will be seen at 6 weeks and 6 months for symptom check  No heavy lifting for   at least 2-3 weeks after surgery, however patients can walk and do light activities the first day after surgery. Return to work time depends on occupation.    What are the advantages of HoLEP?  HoLEP has been studied in many different parts of the world and has been shown to be a safe and effective procedure. Although there are many types of BPH surgeries available, HoLEP offers a  unique advantage in being able to remove a large amount of tissue without any incisions on the body, even in very large prostates, while decreasing the risk of bleeding and providing tissue for pathology (to look for cancer). This decreases the need for blood transfusions during surgery, minimizes hospital stay, and reduces the risk of needing repeat treatment.  What are the side effects of HoLEP?  Temporary burning and bleeding during urination. Some blood may be seen in the urine for weeks after surgery and is part of the healing process.  Urinary incontinence (inability to control urine flow) is expected in all patients immediately after surgery and they should wear pads for the first few days/weeks. This typically improves over the course of several weeks. Performing Kegel exercises can help decrease leakage from stress maneuvers such as coughing, sneezing, or lifting. The rate of long term leakage is very low. Patients may also have leakage with urgency and this may be treated with medication. The risk of urge incontinence can be dependent on several factors including age, prostate size, symptoms, and other medical problems.  Retrograde ejaculation or "backwards ejaculation." In 75% of cases, the patient will not see any fluid during ejaculation after surgery.  Erectile function is generally not significantly affected.   What are the risks of HoLEP?  Injury to the urethra or development of scar tissue at a later date  Injury to the capsule of the prostate (typically treated with longer catheterization).  Injury to the bladder or ureteral orifices (where the urine from the kidney drains out)  Infection of the bladder, testes, or kidneys  Return of urinary obstruction at a later date requiring another operation (<2%)  Need for blood transfusion or re-operation due to bleeding  Failure to relieve all symptoms and/or need for prolonged catheterization after surgery  5-15% of patients are  found to have previously undiagnosed prostate cancer in their specimen. Prostate cancer can be treated after HoLEP.  Standard risks of anesthesia including blood clots, heart attacks, etc  When should I call my doctor?  Fever over 101.3 degrees  Inability to urinate, or large blood clots in the urine  AMBULATORY SURGERY  DISCHARGE INSTRUCTIONS   The drugs that you were given will stay in your system until tomorrow so for the next 24 hours you should not:  Drive an automobile Make any legal decisions Drink any alcoholic beverage   You may resume regular meals tomorrow.  Today it is better to start with liquids and gradually work up to solid foods.  You may eat anything you prefer, but it is better to start with liquids, then soup and crackers, and gradually work up to solid foods.   Please notify your doctor immediately if you have any unusual bleeding, trouble breathing, redness and pain at the surgery site, drainage, fever, or pain not relieved by medication.    Additional Instructions:        Please contact your physician with any problems or Same Day Surgery at [phone], Monday through Friday 6 am to 4 pm, or [place] at [place] Main number   at [phone].  

## 2022-09-04 NOTE — Op Note (Signed)
Date of procedure: 09/04/22  Preoperative diagnosis:  BPH with BOO Urinary retention  Postoperative diagnosis:  same   Procedure: HoLEP with morcellation  Surgeon: Hollice Espy, MD  Anesthesia: General  Complications: None  Intraoperative findings: Large median lobe with trabeculated bladder with saccules or diverticula  EBL: Minimal  Specimens: Prostate chips  Drains: 85 French Foley catheter with 50 cc in the balloon  Indication: Dakota Harper is a 76 y.o. patient with refractory urinary retention despite maximal medical management.  After reviewing the management options for treatment, he elected to proceed with the above surgical procedure(s). We have discussed the potential benefits and risks of the procedure, side effects of the proposed treatment, the likelihood of the patient achieving the goals of the procedure, and any potential problems that might occur during the procedure or recuperation. Informed consent has been obtained.  Description of procedure:  The patient was taken to the operating room and general anesthesia was induced.  The patient was placed in the dorsal lithotomy position, prepped and draped in the usual sterile fashion, and preoperative antibiotics were administered. A preoperative time-out was performed.     A 26 French resectoscope sheath using a blunt angled obturator was introduced without difficulty into the bladder.  The bladder was carefully inspected and noted to be moderately trabeculated.  There is an elevated bladder neck with a very small intravesical component.  The trigone was able to be visualized with some manipulation and the UOs were good distance bladder neck itself.  The prostatic fossa had significant trilobar coaptation with greater than 5 cm prostatic length.  A 550 m laser fiber was then brought in and using settings of 1 J's and 60 Hz, 2 incisions were created at the 5:00 and 7:00 positions of the bladder neck on either side  of the median lobe down to the level of the bladder neck/capsular fibers.  The incision was carried down caudally meeting in the midline just above the verumontanum.  The median lobe was then enucleated from a caudal to cranial direction cleaving the adenoma off the underlying capsule rolling it towards the bladder neck and ultimately cleaving the mucosa to free the median lobe into the bladder.   Next, a semilunar incision was created at the prostatic apex on the left side again freeing up the adenoma from the underlying capsule.  Care was taken to avoid any resection past the verumontanum.  This incision was carried around laterally and cranially towards the bladder neck.  Ultimately, I was able to complete the anterior commissure mucosa and the adenoma into the bladder creating a widely patent prostatic fossa.     Next, the same similar incision was created at the right prostatic apex.  This adenoma however ended up being enucleated and more of a piece wise fashion freeing up a large BPH nodules from the capsular fibers.  Once this was completed and cleared from the bladder neck, the prostatic fossa was noted to be widely patent.  Hemostasis was achieved using hemostatic fiber settings.  Bilateral UOs were visualized and free of any injury.  Finally, the 15 French resectoscope was exchanged for nephroscope and using the Piranha handpiece morcellator, the bladder was distended in each of the prostate chips were evacuated.  A small mucosal injury on the right lateral bladder wall but appeared to be superficial . The bladder was irrigated several times and smaller chips were cleared from the bladder.  This point time, there were no residual fibers appreciated in the  bladder.  Hemostasis was adequate.  10 mg of IV Lasix was administered to help with postoperative diuresis.  A 20 French two-way Foley catheter was then inserted over a catheter guide with 50 cc in the balloon.  The catheter irrigated easily and  well.  Patient was then clean and dry, repositioned supine position, reversed from anesthesia, taken to PACU in stable condition.   Plan: Patient will return to the office in 1 week for voiding trial.  I will see him in about 6 weeks postop for IPSS/PVR    Hollice Espy, M.D.

## 2022-09-04 NOTE — Interval H&P Note (Signed)
History and Physical Interval Note:  09/04/2022 8:19 AM  Dakota Harper  has presented today for surgery, with the diagnosis of Benign Prostatic Hyperplasia with Urinary Obstruction.  The various methods of treatment have been discussed with the patient and family. After consideration of risks, benefits and other options for treatment, the patient has consented to  Procedure(s): [place] WITH MORCELLATION (N/A) as a surgical intervention.  The patient's history has been reviewed, patient examined, no change in status, stable for surgery.  I have reviewed the patient's chart and labs.  Questions were answered to the patient's satisfaction.   \ RRR CTAB  Currently on cipro from for preop +UCx  Hollice Espy

## 2022-09-05 ENCOUNTER — Encounter: Payer: Self-pay | Admitting: Urology

## 2022-09-05 LAB — SURGICAL PATHOLOGY

## 2022-09-06 ENCOUNTER — Encounter: Payer: Self-pay | Admitting: Ophthalmology

## 2022-09-07 NOTE — Discharge Instructions (Signed)

## 2022-09-11 ENCOUNTER — Encounter
Admission: RE | Disposition: A | Discharge status: Home / Self Care | Payer: Self-pay | Source: Home / Self Care | Attending: Ophthalmology

## 2022-09-11 ENCOUNTER — Ambulatory Visit
Admission: RE | Admit: 2022-09-11 | Discharge: 2022-09-11 | Disposition: A | Discharge status: Home / Self Care | Payer: 59 | Attending: Ophthalmology | Admitting: Ophthalmology

## 2022-09-11 ENCOUNTER — Other Ambulatory Visit: Payer: Self-pay

## 2022-09-11 ENCOUNTER — Ambulatory Visit: Payer: 59 | Admitting: Anesthesiology

## 2022-09-11 ENCOUNTER — Encounter: Payer: Self-pay | Admitting: Ophthalmology

## 2022-09-11 DIAGNOSIS — K219 Gastro-esophageal reflux disease without esophagitis: Secondary | ICD-10-CM | POA: Insufficient documentation

## 2022-09-11 DIAGNOSIS — I10 Essential (primary) hypertension: Secondary | ICD-10-CM | POA: Insufficient documentation

## 2022-09-11 DIAGNOSIS — H2511 Age-related nuclear cataract, right eye: Secondary | ICD-10-CM | POA: Insufficient documentation

## 2022-09-11 DIAGNOSIS — Z85828 Personal history of other malignant neoplasm of skin: Secondary | ICD-10-CM | POA: Insufficient documentation

## 2022-09-11 DIAGNOSIS — E039 Hypothyroidism, unspecified: Secondary | ICD-10-CM | POA: Diagnosis not present

## 2022-09-11 DIAGNOSIS — E785 Hyperlipidemia, unspecified: Secondary | ICD-10-CM | POA: Insufficient documentation

## 2022-09-11 DIAGNOSIS — J449 Chronic obstructive pulmonary disease, unspecified: Secondary | ICD-10-CM | POA: Insufficient documentation

## 2022-09-11 DIAGNOSIS — Z87891 Personal history of nicotine dependence: Secondary | ICD-10-CM | POA: Diagnosis not present

## 2022-09-11 HISTORY — PX: CATARACT EXTRACTION W/PHACO: SHX586

## 2022-09-11 HISTORY — DX: Presence of external hearing-aid: Z97.4

## 2022-09-11 SURGERY — PHACOEMULSIFICATION, CATARACT, WITH IOL INSERTION
Anesthesia: Monitor Anesthesia Care | Site: Eye | Laterality: Right

## 2022-09-11 MED ORDER — SIGHTPATH DOSE#1 BSS IO SOLN
INTRAOCULAR | Status: DC | PRN
  Administered 2022-09-11: 131 mL via OPHTHALMIC

## 2022-09-11 MED ORDER — MOXIFLOXACIN HCL 0.5 % OP SOLN
OPHTHALMIC | Status: DC | PRN
  Administered 2022-09-11: .2 mL via OPHTHALMIC

## 2022-09-11 MED ORDER — MIDAZOLAM HCL 2 MG/2ML IJ SOLN
INTRAMUSCULAR | Status: DC | PRN
  Administered 2022-09-11 (×2): 1 mg via INTRAVENOUS

## 2022-09-11 MED ORDER — TETRACAINE HCL 0.5 % OP SOLN
1.0000 [drp] | OPHTHALMIC | Status: DC | PRN
  Administered 2022-09-11 (×3): 1 [drp] via OPHTHALMIC

## 2022-09-11 MED ORDER — LIDOCAINE HCL (PF) 2 % IJ SOLN
INTRAOCULAR | Status: DC | PRN
  Administered 2022-09-11: 1 mL via INTRAOCULAR

## 2022-09-11 MED ORDER — FENTANYL CITRATE (PF) 100 MCG/2ML IJ SOLN
INTRAMUSCULAR | Status: DC | PRN
  Administered 2022-09-11: 50 ug via INTRAVENOUS

## 2022-09-11 MED ORDER — SIGHTPATH DOSE#1 BSS IO SOLN
INTRAOCULAR | Status: DC | PRN
  Administered 2022-09-11: 15 mL

## 2022-09-11 MED ORDER — LACTATED RINGERS IV SOLN: INTRAVENOUS | Status: DC

## 2022-09-11 MED ORDER — SIGHTPATH DOSE#1 NA HYALUR & NA CHOND-NA HYALUR IO KIT
PACK | INTRAOCULAR | Status: DC | PRN
  Administered 2022-09-11: 1 via OPHTHALMIC

## 2022-09-11 MED ORDER — ARMC OPHTHALMIC DILATING DROPS
1.0000 | OPHTHALMIC | Status: DC | PRN
  Administered 2022-09-11 (×3): 1 via OPHTHALMIC

## 2022-09-11 SURGICAL SUPPLY — 13 items
CATARACT SUITE SIGHTPATH (MISCELLANEOUS) ×1 IMPLANT
DISSECTOR HYDRO NUCLEUS 50X22 (MISCELLANEOUS) ×1 IMPLANT
FEE CATARACT SUITE SIGHTPATH (MISCELLANEOUS) ×1 IMPLANT
GLOVE SURG GAMMEX PI TX LF 7.5 (GLOVE) ×1 IMPLANT
GLOVE SURG SYN 8.5  E (GLOVE) ×1
GLOVE SURG SYN 8.5 E (GLOVE) ×1 IMPLANT
GLOVE SURG SYN 8.5 PF PI (GLOVE) ×1 IMPLANT
LENS IOL TECNIS EYHANCE 21.5 (Intraocular Lens) IMPLANT
NDL FILTER BLUNT 18X1 1/2 (NEEDLE) ×1 IMPLANT
NEEDLE FILTER BLUNT 18X1 1/2 (NEEDLE) ×1 IMPLANT
SYR 3ML LL SCALE MARK (SYRINGE) ×1 IMPLANT
SYR 5ML LL (SYRINGE) ×1 IMPLANT
WATER STERILE IRR 250ML POUR (IV SOLUTION) ×1 IMPLANT

## 2022-09-11 NOTE — Anesthesia Postprocedure Evaluation (Signed)
Anesthesia Post Note  Patient: Dakota Harper  Procedure(s) Performed: CATARACT EXTRACTION PHACO AND INTRAOCULAR LENS PLACEMENT (IOC) RIGHT  3.53  00:38.4 (Right: Eye)  Patient location during evaluation: PACU Anesthesia Type: MAC Level of consciousness: awake and alert Pain management: pain level controlled Vital Signs Assessment: post-procedure vital signs reviewed and stable Respiratory status: spontaneous breathing, nonlabored ventilation, respiratory function stable and patient connected to nasal cannula oxygen Cardiovascular status: stable and blood pressure returned to baseline Postop Assessment: no apparent nausea or vomiting Anesthetic complications: no  No notable events documented.   Last Vitals:  Vitals:   09/11/22 1016 09/11/22 1020  BP: 125/75 115/71  Pulse: 84 81  Resp: 14 20  Temp: (!) 36.2 C (!) 36.2 C  SpO2: 100% 100%    Last Pain:  Vitals:   09/11/22 1020  TempSrc:   PainSc: 0-No pain                 Dimas Millin

## 2022-09-11 NOTE — Anesthesia Preprocedure Evaluation (Addendum)
Anesthesia Evaluation  Patient identified by MRN, date of birth, ID band Patient awake    Reviewed: Allergy & Precautions, NPO status , Patient's Chart, lab work & pertinent test results  History of Anesthesia Complications Negative for: history of anesthetic complications  Airway Mallampati: III  TM Distance: >3 FB Neck ROM: full    Dental  (+) Chipped, Poor Dentition, Missing   Pulmonary neg pulmonary ROS, neg shortness of breath, asthma , sleep apnea , resolved, COPD,  COPD inhaler, former smoker   Pulmonary exam normal        Cardiovascular hypertension, + angina  (-) Past MI and (-) CABG negative cardio ROS Normal cardiovascular exam     Neuro/Psych  Headaches PSYCHIATRIC DISORDERS      negative neurological ROS  negative psych ROS   GI/Hepatic negative GI ROS, Neg liver ROS,GERD  Controlled,,  Endo/Other  negative endocrine ROSHypothyroidism    Renal/GU Renal disease  negative genitourinary   Musculoskeletal   Abdominal   Peds  Hematology negative hematology ROS (+)   Anesthesia Other Findings Patient reports that he does that think that any food or pills are stuck in his throat at this time.  Past Medical History: No date: Asthma 07/04/2021: Basal cell carcinoma     Comment:  L nasal ala. EDC scheduled 08/02/21 No date: BPH (benign prostatic hyperplasia) No date: Colon adenomas No date: COPD (chronic obstructive pulmonary disease) ([place]) 02/28/2021: Dysplastic nevus     Comment:  R scapula, mod atypia No date: Ear pain No date: GERD (gastroesophageal reflux disease) No date: Headache No date: Hypercholesteremia No date: Hypertension No date: Hypothyroidism No date: Neuropathy No date: Sleep apnea No date: Sleep disorder  Past Surgical History: No date: CHOLECYSTECTOMY No date: COLONOSCOPY 08/01/2021: COLONOSCOPY WITH PROPOFOL; N/A     Comment:  Procedure: COLONOSCOPY WITH PROPOFOL;  Surgeon:  Jonathon Bellows, MD;  Location: [facility] ENDOSCOPY;  Service:               Gastroenterology;  Laterality: N/A; No date: HERNIA REPAIR 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  BMI    Body Mass Index: 31.17 kg/m      Reproductive/Obstetrics negative OB ROS                             Anesthesia Physical Anesthesia Plan  ASA: 3  Anesthesia Plan: MAC   Post-op Pain Management:    Induction: Intravenous  PONV Risk Score and Plan: Midazolam  Airway Management Planned: Natural Airway and Nasal Cannula  Additional Equipment:   Intra-op Plan:   Post-operative Plan:   Informed Consent: I have reviewed the patients History and Physical, chart, labs and discussed the procedure including the risks, benefits and alternatives for the proposed anesthesia with the patient or authorized representative who has indicated his/her understanding and acceptance.     Dental Advisory Given  Plan Discussed with: Anesthesiologist, CRNA and Surgeon  Anesthesia Plan Comments: (Patient consented for risks of anesthesia including but not limited to:  - adverse reactions to medications - risk of airway placement if required - damage to eyes, teeth, lips or other oral mucosa - nerve damage due to positioning  - sore throat or hoarseness - Damage to heart, brain, nerves, lungs, other parts of body or loss of life  Patient voiced understanding.)        Anesthesia Quick Evaluation

## 2022-09-11 NOTE — Op Note (Signed)
OPERATIVE NOTE  SILVESTER REIERSON [id number] 09/11/2022   PREOPERATIVE DIAGNOSIS:  Nuclear sclerotic cataract right eye.  H25.11   POSTOPERATIVE DIAGNOSIS:    Nuclear sclerotic cataract right eye.     PROCEDURE:  Phacoemusification with posterior chamber intraocular lens placement of the right eye   LENS:   Implant Name Type Inv. Item Serial No. Manufacturer Lot No. LRB No. Used Action  LENS IOL TECNIS EYHANCE 21.5 - [id number] Intraocular Lens LENS IOL TECNIS EYHANCE 21.5 [id number] [employer]  Right 1 Implanted       Procedure(s): CATARACT EXTRACTION PHACO AND INTRAOCULAR LENS PLACEMENT (IOC) RIGHT  3.53  00:38.4 (Right)  DIB00 +21.5   SURGEON:  Benay Pillow, MD, MPH  ANESTHESIOLOGIST: Anesthesiologist: Dimas Millin, MD CRNA: Jacqualin Combes, CRNA   ANESTHESIA:  Topical with tetracaine drops augmented with 1% preservative-free intracameral lidocaine.  ESTIMATED BLOOD LOSS: less than 1 mL.   COMPLICATIONS:  None.   DESCRIPTION OF PROCEDURE:  The patient was identified in the holding room and transported to the operating room and placed in the supine position under the operating microscope.  The right eye was identified as the operative eye and it was prepped and draped in the usual sterile ophthalmic fashion.   A 1.0 millimeter clear-corneal paracentesis was made at the 10:30 position. 0.5 ml of preservative-free 1% lidocaine with epinephrine was injected into the anterior chamber.  The anterior chamber was filled with viscoelastic.  A 2.4 millimeter keratome was used to make a near-clear corneal incision at the 8:00 position.  A curvilinear capsulorrhexis was made with a cystotome and capsulorrhexis forceps.  Balanced salt solution was used to hydrodissect and hydrodelineate the nucleus.   Phacoemulsification was then used in stop and chop fashion to remove the lens nucleus and epinucleus.  The remaining cortex was then removed using the irrigation and aspiration  handpiece. Viscoelastic was then placed into the capsular bag to distend it for lens placement.  A lens was then injected into the capsular bag.  The remaining viscoelastic was aspirated.   Wounds were hydrated with balanced salt solution.  The anterior chamber was inflated to a physiologic pressure with balanced salt solution.   Intracameral vigamox 0.1 mL undiluted was injected into the eye and a drop placed onto the ocular surface.  No wound leaks were noted.  The patient was taken to the recovery room in stable condition without complications of anesthesia or surgery  Benay Pillow 09/11/2022, 10:13 AM

## 2022-09-11 NOTE — Transfer of Care (Signed)
Immediate Anesthesia Transfer of Care Note  Patient: Dakota Harper  Procedure(s) Performed: CATARACT EXTRACTION PHACO AND INTRAOCULAR LENS PLACEMENT (IOC) RIGHT  3.53  00:38.4 (Right: Eye)  Patient Location: PACU  Anesthesia Type: MAC  Level of Consciousness: awake, alert  and patient cooperative  Airway and Oxygen Therapy: Patient Spontanous Breathing and Patient connected to supplemental oxygen  Post-op Assessment: Post-op Vital signs reviewed, Patient's Cardiovascular Status Stable, Respiratory Function Stable, Patent Airway and No signs of Nausea or vomiting  Post-op Vital Signs: Reviewed and stable  Complications: No notable events documented.

## 2022-09-11 NOTE — H&P (Signed)
[facility]   Primary Care Physician:  [name], NP Ophthalmologist: Dr. Benay Pillow  Pre-Procedure History & Physical: HPI:  Dakota Harper is a 76 y.o. male here for cataract surgery.   Past Medical History:  Diagnosis Date   Anxiety    Arthritis    Asthma    Basal cell carcinoma 07/04/2021   L nasal ala. 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   R scapula, mod atypia   Ear pain    GERD (gastroesophageal reflux disease)    Gout    Headache    History of kidney stones    Hypercholesteremia    Hypertension    Hypothyroidism    Neuropathy    Pneumonia    Sleep disorder    Wears hearing aid in both ears     Past Surgical History:  Procedure Laterality Date   ADJACENT TISSUE TRANSFER/TISSUE REARRANGEMENT Left 12/19/2021   Procedure: ADJACENT TISSUE TRANSFER/TISSUE REARRANGEMENT;  Surgeon: Cindra Presume, MD;  Location: [place];  Service: Plastics;  Laterality: Left;   CHOLECYSTECTOMY     COLONOSCOPY     COLONOSCOPY WITH PROPOFOL N/A 08/01/2021   Procedure: COLONOSCOPY WITH PROPOFOL;  Surgeon: Jonathon Bellows, MD;  Location: [facility] ENDOSCOPY;  Service: Gastroenterology;  Laterality: N/A;   ESOPHAGOGASTRODUODENOSCOPY (EGD) WITH PROPOFOL N/A 11/30/2021   Procedure: ESOPHAGOGASTRODUODENOSCOPY (EGD) WITH PROPOFOL;  Surgeon: Jonathon Bellows, MD;  Location: [facility]OSCOPY;  Service: Gastroenterology;  Laterality: N/A;   HERNIA REPAIR     HOLEP-LASER ENUCLEATION OF THE PROSTATE WITH MORCELLATION N/A 09/04/2022   Procedure: HOLEP-LASER ENUCLEATION OF THE PROSTATE WITH MORCELLATION;  Surgeon: Hollice Espy, MD;  Location: [facility];  Service: Urology;  Laterality: N/A;   NASAL RECONSTRUCTION Left 12/19/2021   Procedure: NASAL RECONSTRUCTION;  Surgeon: Cindra Presume, MD;  Location: [place];  Service: Plastics;   Laterality: Left;  2 hours   pituitary tumor excision      Prior to Admission medications   Medication Sig Start Date End Date Taking? Authorizing Provider  acetaminophen (TYLENOL) 500 MG tablet Take 500 mg by mouth every 4 (four) hours as needed for moderate pain or mild pain.   Yes [provider]  albuterol (PROVENTIL HFA;VENTOLIN HFA) 108 (90 BASE) MCG/ACT inhaler Inhale 1 puff into the lungs 3 (three) times daily as needed for wheezing or shortness of breath.   Yes [provider]  alum & mag hydroxide-simeth (MAALOX/MYLANTA) 200-200-20 MG/5ML suspension Take 30 mLs by mouth 4 (four) times daily as needed for indigestion or heartburn.   Yes [provider]  amLODipine (NORVASC) 5 MG tablet Take 1 tablet (5 mg total) by mouth daily. 07/13/22 09/11/22 Yes Sreenath, Sudheer B, MD  aspirin EC 81 MG tablet Take 81 mg by mouth at bedtime. (2100)   Yes [provider]  atorvastatin (LIPITOR) 10 MG tablet Take 10 mg by mouth every evening. (2000)   Yes [provider]  carbamide peroxide (EAR WAX REMOVAL DROPS) 6.5 % OTIC solution Place 5 drops into both ears every Sunday.   Yes [provider]  chlorhexidine (PERIDEX) 0.12 % solution Use as directed 15 mLs in the mouth or throat 2 (two) times daily as needed (oral discomfort).   Yes [provider]  ciprofloxacin (CIPRO) 500 MG tablet Take 1  tablet (500 mg total) by mouth 2 (two) times daily. 09/01/22  Yes Hollice Espy, MD  cycloSPORINE (RESTASIS) 0.05 % ophthalmic emulsion Place 1 drop into both eyes 2 (two) times daily as needed (dry eyes).   Yes [provider]  diclofenac Sodium (VOLTAREN) 1 % GEL Apply 2 g topically as needed (lower left leg pain.). 03/31/21  Yes [provider]  diphenhydrAMINE (BENADRYL) 25 MG tablet Take 25 mg by mouth every 6 (six) hours as needed for allergies.   Yes [provider]  fenofibrate 54 MG tablet Take 1 tablet (54 mg total)  by mouth daily. 11/01/20  Yes Sreenath, Sudheer B, MD  finasteride (PROSCAR) 5 MG tablet Take 1 tablet (5 mg total) by mouth daily. 01/22/20  Yes McGowan, Larene Beach A, PA-C  Fluocinolone Acetonide Body 0.01 % OIL Apply to scalp 1-2 times a day and leave on. 07/04/22  Yes Brendolyn Patty, MD  fluocinonide ointment (LIDEX) 0.24 % Apply 1 application topically 2 (two) times daily as needed (pruritus). (apply to legs)   Yes [provider]  fluticasone furoate-vilanterol (BREO ELLIPTA) 200-25 MCG/INH AEPB Inhale 1 puff into the lungs daily. (0800) 08/10/20  Yes [provider]  gabapentin (NEURONTIN) 100 MG capsule Take 100 mg by mouth at bedtime. Take along with one 400 mg capsule for total dose 500 mg daily at bedtime 06/23/22  Yes [provider]  gabapentin (NEURONTIN) 400 MG capsule Take 400 mg by mouth at bedtime. Take along with one 100 mg capsule for total dose 500 mg daily at bedtime 11/08/21  Yes [provider]  HYDROcodone-acetaminophen (NORCO/VICODIN) 5-325 MG tablet Take 1-2 tablets by mouth every 6 (six) hours as needed for moderate pain. 09/04/22  Yes Hollice Espy, MD  ketoconazole (NIZORAL) 2 % cream Apply 1 Application topically daily.   Yes [provider]  ketoconazole (NIZORAL) 2 % shampoo Massage into scalp daily and let sit several minutes before rinsing. 10/12/21  Yes Brendolyn Patty, MD  lactulose (CHRONULAC) 10 GM/15ML solution Take 30 mLs (20 g total) by mouth daily as needed for mild constipation. 03/20/21  Yes Paulette Blanch, MD  levothyroxine (SYNTHROID, LEVOTHROID) 50 MCG tablet Take 50 mcg by mouth daily before breakfast. (0630)   Yes [provider]  loperamide (IMODIUM) 2 MG capsule Take 4 mg by mouth as needed for diarrhea or loose stools.   Yes [provider]  magnesium hydroxide (MILK OF MAGNESIA) 400 MG/5ML suspension Take 30 mLs by mouth daily as needed (constipation (no bowel movement for 3 days)).   Yes [provider]  mirabegron ER (MYRBETRIQ) 50 MG TB24 tablet Take 50 mg by mouth daily.   Yes [provider]  montelukast (SINGULAIR) 5 MG chewable tablet Chew 5 mg by mouth at bedtime. (2100)   Yes [provider]  nortriptyline (PAMELOR) 75 MG capsule Take 150 mg by mouth daily. (0900)   Yes [provider]  omeprazole (PRILOSEC) 40 MG capsule Take 1 capsule (40 mg total) by mouth daily. 11/16/21  Yes Jonathon Bellows, MD  ondansetron (ZOFRAN-ODT) 4 MG disintegrating tablet Take 1 tablet (4 mg total) by mouth every 8 (eight) hours as needed for nausea or vomiting. 12/07/21  Yes Corena Herter, PA-C  paliperidone (INVEGA) 3 MG 24 hr tablet Take 3 mg by mouth at bedtime.   Yes [provider]  polyethylene glycol (MIRALAX / GLYCOLAX) 17 g packet Take 17 g by mouth daily as needed (constipation.).   Yes [provider]  Skin Protectants, Misc. (EUCERIN) cream Apply 1 application topically in the morning and at bedtime. (0800 & 2000)   Yes [provider]  tamsulosin (FLOMAX) 0.4 MG CAPS capsule Take 0.4 mg by mouth at bedtime. (2100)   Yes [provider]  tiZANidine (ZANAFLEX) 2 MG tablet Take 2 mg by mouth at bedtime. (2000)   Yes [provider]  valproic acid (DEPAKENE) 250 MG capsule Take 250 mg by mouth 2 (two) times daily.   Yes [provider]  amoxicillin-clavulanate (AUGMENTIN) 875-125 MG tablet Take 1 tablet by mouth 2 (two) times daily. Patient not taking: Reported on 09/11/2022    [provider]  guaiFENesin (ROBITUSSIN) 100 MG/5ML liquid Take 300 mg by mouth every 6 (six) hours as needed for cough. Patient not taking: Reported on 09/11/2022    [provider]  ramelteon (ROZEREM) 8 MG tablet Take 8 mg by mouth at bedtime. Patient not taking: Reported on 09/11/2022    [provider]    Allergies as of 08/02/2022 - Review Complete 07/17/2022  Allergen Reaction Noted   Saw palmetto  Hives and Rash 02/19/2013   Other Other (See Comments) and Rash 05/25/2011   Sulfa antibiotics Rash 02/11/2013   Sulfacetamide sodium Rash 09/10/2014    Family History  Problem Relation Age of Onset   Kidney cancer Mother    Heart attack Father    Alzheimer's disease Father    Heart attack Sister    Diabetes Mellitus II Sister    Benign prostatic hyperplasia Brother    Prostate cancer Neg Hx    Bladder Cancer Neg Hx     Social History   Socioeconomic History   Marital status: Single    Spouse name: Not on file   Number of children: Not on file   Years of education: Not on file   Highest education level: Not on file  Occupational History   Not on file  Tobacco Use   Smoking status: Former    Types: Cigarettes   Smokeless tobacco: Never  Vaping Use   Vaping Use: Never used  Substance and Sexual Activity   Alcohol use: No   Drug use: No   Sexual activity: Not on file  Other Topics Concern   Not on file  Social History Narrative   Not on file   Social Determinants of Health   Financial Resource Strain: Not on file  Food Insecurity: Not on file  Transportation Needs: Not on file  Physical Activity: Not on file  Stress: Not on file  Social Connections: Not on file  Intimate Partner Violence: Not on file    Review of Systems: See HPI, otherwise negative ROS  Physical Exam: BP 127/76   Pulse 93   Temp (!) 97.2 F (36.2 C) (Oral)   Resp 13   Ht '5\' 8"'$  (1.727 m)   Wt 94.9 kg   SpO2 100%   BMI 31.81 kg/m  General:   Alert, cooperative in NAD Head:  Normocephalic and atraumatic. Respiratory:  Normal work of breathing. Cardiovascular:  RRR  Impression/Plan: Dakota Harper is here for cataract surgery.  Risks, benefits, limitations, and alternatives regarding cataract surgery have been reviewed with the patient.  Questions have been answered.  All parties agreeable.   Benay Pillow, MD  09/11/2022, 9:46 AM

## 2022-09-12 ENCOUNTER — Encounter: Payer: Self-pay | Admitting: Ophthalmology

## 2022-09-12 ENCOUNTER — Ambulatory Visit (INDEPENDENT_AMBULATORY_CARE_PROVIDER_SITE_OTHER): Payer: 59 | Admitting: Physician Assistant

## 2022-09-12 ENCOUNTER — Ambulatory Visit: Payer: 59 | Admitting: Physician Assistant

## 2022-09-12 VITALS — Ht 68.0 in | Wt 209.0 lb

## 2022-09-12 DIAGNOSIS — N529 Male erectile dysfunction, unspecified: Secondary | ICD-10-CM | POA: Diagnosis not present

## 2022-09-12 DIAGNOSIS — R310 Gross hematuria: Secondary | ICD-10-CM

## 2022-09-12 LAB — BLADDER SCAN AMB NON-IMAGING: Scan Result: 79

## 2022-09-12 MED ORDER — TADALAFIL 20 MG PO TABS: ORAL_TABLET | ORAL | 0 refills | Status: DC

## 2022-09-12 NOTE — Patient Instructions (Signed)
Congratulations on your recent HOLEP procedure! As discussed in clinic today, these are the three normal postoperative findings after this surgery: Burning or pain with urination: This typically resolves within 1 week of surgery. If you are still having significant pain with urination 10 days after surgery, please call our clinic. We may need to check you for a urinary tract infection at that point, though this is rare. Blood in the urine: This may either come and go or steadily improve before going away, but typically resolves completely within 3 weeks of surgery. If you are on blood thinners, it may take longer for the bleeding to resolve. Please note that you may find that you pass clumps of tissue or debris around 10-14 days after surgery associated with some new bleeding. This is due to sloughing of your postoperative scab and is also normal. If at any point you start to pass dark red urine; thick, ketchup-like urine; or large blood clots around the size of your palm, please call our office immediately. Urinary leakage or urgency: This tends to improve with time, with most patients becoming dry within around 3 months of surgery. You may wear absorbant underwear or liners for security during this time. To help you get dry faster, please make sure you are completing your Kegel exercises as instructed, with a set of 10 exercises completed up to three times daily. Further information on Kegel exercises can be found in the back of this packet.  

## 2022-09-12 NOTE — Progress Notes (Signed)
Catheter Removal  Patient is present today for a catheter removal.  71m of water was drained from the balloon. A 20FR foley cath was removed from the bladder, no complications were noted. Patient tolerated well.  Performed by: TLouretta ShortenValentine,CMA   Follow up/ Additional notes: Return this afternoon for  for voiding trail    Afternoon follow-up  Patient returned to clinic this afternoon for repeat PVR. He has been able to urinate. He has had a small amount of urinary leakage. PVR 721m  Results for orders placed or performed in visit on 09/12/22  Bladder Scan (Post Void Residual) in office  Result Value Ref Range   Scan Result 79     Voiding trial passed. Counseled patient on normal postoperative findings including dysuria, gross hematuria, and urinary urgency/leakage. Counseled patient to begin Kegel exercises 3x10 sets daily to increase urinary control and wear absorbent products as needed for security. Written and verbal resources provided today. Surgical pathology negative; results shared with patient.   As he was leaving clinic today, he requested a prescription for tadalafil. He heard it works better than sildenafil. He has never taken either medication. He reports erectile dysfunction and is unable to attain erection at all at baseline. He does not have a cardiologist, denies a history of heart failure, and is not taking nitrates for chest pain. Tadalafil rx printed for the patient per his request. Will plan for SHIM at postop visit with Dr. BrErlene Quan

## 2022-09-14 ENCOUNTER — Telehealth: Payer: Self-pay | Admitting: Urology

## 2022-09-19 ENCOUNTER — Encounter: Payer: Self-pay | Admitting: Ophthalmology

## 2022-09-21 NOTE — Discharge Instructions (Signed)

## 2022-09-25 ENCOUNTER — Encounter
Admission: RE | Disposition: A | Discharge status: Home / Self Care | Payer: Self-pay | Source: Home / Self Care | Attending: Ophthalmology

## 2022-09-25 ENCOUNTER — Encounter: Payer: Self-pay | Admitting: Ophthalmology

## 2022-09-25 ENCOUNTER — Ambulatory Visit: Payer: 59 | Admitting: Anesthesiology

## 2022-09-25 ENCOUNTER — Other Ambulatory Visit: Payer: Self-pay

## 2022-09-25 ENCOUNTER — Ambulatory Visit
Admission: RE | Admit: 2022-09-25 | Discharge: 2022-09-25 | Disposition: A | Discharge status: Home / Self Care | Payer: 59 | Attending: Ophthalmology | Admitting: Ophthalmology

## 2022-09-25 DIAGNOSIS — Z85828 Personal history of other malignant neoplasm of skin: Secondary | ICD-10-CM | POA: Diagnosis not present

## 2022-09-25 DIAGNOSIS — I10 Essential (primary) hypertension: Secondary | ICD-10-CM | POA: Insufficient documentation

## 2022-09-25 DIAGNOSIS — Z87891 Personal history of nicotine dependence: Secondary | ICD-10-CM | POA: Diagnosis not present

## 2022-09-25 DIAGNOSIS — E7800 Pure hypercholesterolemia, unspecified: Secondary | ICD-10-CM | POA: Diagnosis not present

## 2022-09-25 DIAGNOSIS — H2512 Age-related nuclear cataract, left eye: Secondary | ICD-10-CM | POA: Insufficient documentation

## 2022-09-25 DIAGNOSIS — N400 Enlarged prostate without lower urinary tract symptoms: Secondary | ICD-10-CM | POA: Diagnosis not present

## 2022-09-25 DIAGNOSIS — M1990 Unspecified osteoarthritis, unspecified site: Secondary | ICD-10-CM | POA: Insufficient documentation

## 2022-09-25 DIAGNOSIS — E039 Hypothyroidism, unspecified: Secondary | ICD-10-CM | POA: Insufficient documentation

## 2022-09-25 DIAGNOSIS — J449 Chronic obstructive pulmonary disease, unspecified: Secondary | ICD-10-CM | POA: Diagnosis not present

## 2022-09-25 DIAGNOSIS — G629 Polyneuropathy, unspecified: Secondary | ICD-10-CM | POA: Insufficient documentation

## 2022-09-25 DIAGNOSIS — G4730 Sleep apnea, unspecified: Secondary | ICD-10-CM | POA: Insufficient documentation

## 2022-09-25 DIAGNOSIS — K219 Gastro-esophageal reflux disease without esophagitis: Secondary | ICD-10-CM | POA: Diagnosis not present

## 2022-09-25 HISTORY — PX: CATARACT EXTRACTION W/PHACO: SHX586

## 2022-09-25 SURGERY — PHACOEMULSIFICATION, CATARACT, WITH IOL INSERTION
Anesthesia: Monitor Anesthesia Care | Site: Eye | Laterality: Left

## 2022-09-25 MED ORDER — MOXIFLOXACIN HCL 0.5 % OP SOLN
OPHTHALMIC | Status: DC | PRN
  Administered 2022-09-25: .2 mL via OPHTHALMIC

## 2022-09-25 MED ORDER — LIDOCAINE HCL (PF) 2 % IJ SOLN
INTRAOCULAR | Status: DC | PRN
  Administered 2022-09-25: 1 mL via INTRAOCULAR

## 2022-09-25 MED ORDER — DEXMEDETOMIDINE HCL IN NACL 80 MCG/20ML IV SOLN
INTRAVENOUS | Status: DC | PRN
  Administered 2022-09-25: 4 ug via BUCCAL

## 2022-09-25 MED ORDER — SIGHTPATH DOSE#1 NA HYALUR & NA CHOND-NA HYALUR IO KIT
PACK | INTRAOCULAR | Status: DC | PRN
  Administered 2022-09-25: 1 via OPHTHALMIC

## 2022-09-25 MED ORDER — MIDAZOLAM HCL 2 MG/2ML IJ SOLN
INTRAMUSCULAR | Status: DC | PRN
  Administered 2022-09-25: 1 mg via INTRAVENOUS

## 2022-09-25 MED ORDER — TETRACAINE HCL 0.5 % OP SOLN
1.0000 [drp] | OPHTHALMIC | Status: DC | PRN
  Administered 2022-09-25 (×2): 1 [drp] via OPHTHALMIC

## 2022-09-25 MED ORDER — SIGHTPATH DOSE#1 BSS IO SOLN
INTRAOCULAR | Status: DC | PRN
  Administered 2022-09-25: 86 mL via OPHTHALMIC

## 2022-09-25 MED ORDER — ARMC OPHTHALMIC DILATING DROPS
1.0000 | OPHTHALMIC | Status: AC | PRN
  Administered 2022-09-25 (×3): 1 via OPHTHALMIC

## 2022-09-25 MED ORDER — SIGHTPATH DOSE#1 BSS IO SOLN
INTRAOCULAR | Status: DC | PRN
  Administered 2022-09-25: 15 mL

## 2022-09-25 MED ORDER — FENTANYL CITRATE (PF) 100 MCG/2ML IJ SOLN
INTRAMUSCULAR | Status: DC | PRN
  Administered 2022-09-25: 100 ug via INTRAVENOUS

## 2022-09-25 SURGICAL SUPPLY — 19 items
CANNULA ANT/CHMB 27G (MISCELLANEOUS) IMPLANT
CANNULA ANT/CHMB 27GA (MISCELLANEOUS) IMPLANT
CATARACT SUITE SIGHTPATH (MISCELLANEOUS) ×1 IMPLANT
DISSECTOR HYDRO NUCLEUS 50X22 (MISCELLANEOUS) ×1 IMPLANT
FEE CATARACT SUITE SIGHTPATH (MISCELLANEOUS) ×1 IMPLANT
GLOVE SURG GAMMEX PI TX LF 7.5 (GLOVE) ×1 IMPLANT
GLOVE SURG SYN 8.5  E (GLOVE) ×1
GLOVE SURG SYN 8.5 E (GLOVE) ×1 IMPLANT
GLOVE SURG SYN 8.5 PF PI (GLOVE) ×1 IMPLANT
LENS IOL TECNIS EYHANCE 21.5 (Intraocular Lens) IMPLANT
NDL FILTER BLUNT 18X1 1/2 (NEEDLE) ×1 IMPLANT
NEEDLE FILTER BLUNT 18X1 1/2 (NEEDLE) ×1 IMPLANT
PACK VIT ANT 23G (MISCELLANEOUS) IMPLANT
RING MALYGIN (MISCELLANEOUS) IMPLANT
SUT ETHILON 10-0 CS-B-6CS-B-6 (SUTURE)
SUTURE EHLN 10-0 CS-B-6CS-B-6 (SUTURE) IMPLANT
SYR 3ML LL SCALE MARK (SYRINGE) ×1 IMPLANT
SYR 5ML LL (SYRINGE) ×1 IMPLANT
WATER STERILE IRR 250ML POUR (IV SOLUTION) ×1 IMPLANT

## 2022-09-25 NOTE — H&P (Signed)
[facility]   Primary Care Physician:  [name], NP Ophthalmologist: Dr. Benay Pillow  Pre-Procedure History & Physical: HPI:  Dakota Harper is a 76 y.o. male here for cataract surgery.   Past Medical History:  Diagnosis Date   Anxiety    Arthritis    Asthma    Basal cell carcinoma 07/04/2021   L nasal ala. 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   R scapula, mod atypia   Ear pain    GERD (gastroesophageal reflux disease)    Gout    Headache    History of kidney stones    Hypercholesteremia    Hypertension    Hypothyroidism    Neuropathy    Pneumonia    Sleep disorder    Wears hearing aid in both ears     Past Surgical History:  Procedure Laterality Date   ADJACENT TISSUE TRANSFER/TISSUE REARRANGEMENT Left 12/19/2021   Procedure: ADJACENT TISSUE TRANSFER/TISSUE REARRANGEMENT;  Surgeon: Cindra Presume, MD;  Location: [place];  Service: Plastics;  Laterality: Left;   CATARACT EXTRACTION W/PHACO Right 09/11/2022   Procedure: CATARACT EXTRACTION PHACO AND INTRAOCULAR LENS PLACEMENT (IOC) RIGHT  3.53  00:38.4;  Surgeon: Eulogio Bear, MD;  Location: [place];  Service: Ophthalmology;  Laterality: Right;   CHOLECYSTECTOMY     COLONOSCOPY     COLONOSCOPY WITH PROPOFOL N/A 08/01/2021   Procedure: COLONOSCOPY WITH PROPOFOL;  Surgeon: Jonathon Bellows, MD;  Location: [facility] ENDOSCOPY;  Service: Gastroenterology;  Laterality: N/A;   ESOPHAGOGASTRODUODENOSCOPY (EGD) WITH PROPOFOL N/A 11/30/2021   Procedure: ESOPHAGOGASTRODUODENOSCOPY (EGD) WITH PROPOFOL;  Surgeon: Jonathon Bellows, MD;  Location: [facility] ENDOSCOPY;  Service: Gastroenterology;  Laterality: N/A;   HERNIA REPAIR     HOLEP-LASER ENUCLEATION OF THE PROSTATE WITH MORCELLATION N/A 09/04/2022   Procedure: HOLEP-LASER ENUCLEATION OF THE  PROSTATE WITH MORCELLATION;  Surgeon: Hollice Espy, MD;  Location: [facility];  Service: Urology;  Laterality: N/A;   NASAL RECONSTRUCTION Left 12/19/2021   Procedure: NASAL RECONSTRUCTION;  Surgeon: Cindra Presume, MD;  Location: [place];  Service: Plastics;  Laterality: Left;  2 hours   pituitary tumor excision      Prior to Admission medications   Medication Sig Start Date End Date Taking? Authorizing Provider  acetaminophen (TYLENOL) 500 MG tablet Take 500 mg by mouth every 4 (four) hours as needed for moderate pain or mild pain.   Yes [provider]  albuterol (PROVENTIL HFA;VENTOLIN HFA) 108 (90 BASE) MCG/ACT inhaler Inhale 1 puff into the lungs 3 (three) times daily as needed for wheezing or shortness of breath.   Yes [provider]  alum & mag hydroxide-simeth (MAALOX/MYLANTA) 200-200-20 MG/5ML suspension Take 30 mLs by mouth 4 (four) times daily as needed for indigestion or heartburn.   Yes [provider]  amLODipine (NORVASC) 5 MG tablet Take 1 tablet (5 mg total) by mouth daily. 07/13/22 09/19/22 Yes Sreenath, Sudheer B, MD  amoxicillin-clavulanate (AUGMENTIN) 875-125 MG tablet Take 1 tablet by mouth 2 (two) times daily.   Yes [provider]  aspirin EC 81 MG tablet Take 81 mg by mouth at bedtime. (2100)   Yes [provider]  atorvastatin (LIPITOR) 10 MG tablet Take 10 mg by mouth every evening. (2000)   Yes [provider]  carbamide  peroxide (EAR WAX REMOVAL DROPS) 6.5 % OTIC solution Place 5 drops into both ears every Sunday.   Yes [provider]  chlorhexidine (PERIDEX) 0.12 % solution Use as directed 15 mLs in the mouth or throat 2 (two) times daily as needed (oral discomfort).   Yes [provider]  ciprofloxacin (CIPRO) 500 MG tablet Take 1 tablet (500 mg total) by mouth 2 (two) times daily. 09/01/22  Yes Hollice Espy, MD  cycloSPORINE (RESTASIS) 0.05 % ophthalmic emulsion Place 1 drop into both eyes 2  (two) times daily as needed (dry eyes).   Yes [provider]  diclofenac Sodium (VOLTAREN) 1 % GEL Apply 2 g topically as needed (lower left leg pain.). 03/31/21  Yes [provider]  diphenhydrAMINE (BENADRYL) 25 MG tablet Take 25 mg by mouth every 6 (six) hours as needed for allergies.   Yes [provider]  fenofibrate 54 MG tablet Take 1 tablet (54 mg total) by mouth daily. 11/01/20  Yes Sreenath, Sudheer B, MD  finasteride (PROSCAR) 5 MG tablet Take 1 tablet (5 mg total) by mouth daily. 01/22/20  Yes McGowan, Larene Beach A, PA-C  Fluocinolone Acetonide Body 0.01 % OIL Apply to scalp 1-2 times a day and leave on. 07/04/22  Yes Brendolyn Patty, MD  fluocinonide ointment (LIDEX) 4.31 % Apply 1 application topically 2 (two) times daily as needed (pruritus). (apply to legs)   Yes [provider]  fluticasone furoate-vilanterol (BREO ELLIPTA) 200-25 MCG/INH AEPB Inhale 1 puff into the lungs daily. (0800) 08/10/20  Yes [provider]  gabapentin (NEURONTIN) 100 MG capsule Take 100 mg by mouth at bedtime. Take along with one 400 mg capsule for total dose 500 mg daily at bedtime 06/23/22  Yes [provider]  gabapentin (NEURONTIN) 400 MG capsule Take 400 mg by mouth at bedtime. Take along with one 100 mg capsule for total dose 500 mg daily at bedtime 11/08/21  Yes [provider]  guaiFENesin (ROBITUSSIN) 100 MG/5ML liquid Take 300 mg by mouth every 6 (six) hours as needed for cough.   Yes [provider]  HYDROcodone-acetaminophen (NORCO/VICODIN) 5-325 MG tablet Take 1-2 tablets by mouth every 6 (six) hours as needed for moderate pain. 09/04/22  Yes Hollice Espy, MD  ketoconazole (NIZORAL) 2 % cream Apply 1 Application topically daily.   Yes [provider]  ketoconazole (NIZORAL) 2 % shampoo Massage into scalp daily and let sit several minutes before rinsing. 10/12/21  Yes Brendolyn Patty, MD  lactulose (CHRONULAC) 10 GM/15ML solution  Take 30 mLs (20 g total) by mouth daily as needed for mild constipation. 03/20/21  Yes Paulette Blanch, MD  levothyroxine (SYNTHROID, LEVOTHROID) 50 MCG tablet Take 50 mcg by mouth daily before breakfast. (0630)   Yes [provider]  loperamide (IMODIUM) 2 MG capsule Take 4 mg by mouth as needed for diarrhea or loose stools.   Yes [provider]  magnesium hydroxide (MILK OF MAGNESIA) 400 MG/5ML suspension Take 30 mLs by mouth daily as needed (constipation (no bowel movement for 3 days)).   Yes [provider]  mirabegron ER (MYRBETRIQ) 50 MG TB24 tablet Take 50 mg by mouth daily.   Yes [provider]  montelukast (SINGULAIR) 5 MG chewable tablet Chew 5 mg by mouth at bedtime. (2100)   Yes [provider]  nortriptyline (PAMELOR) 75 MG capsule Take 150 mg by mouth daily. (0900)   Yes [provider]  omeprazole (PRILOSEC) 40 MG capsule Take 1 capsule (40  mg total) by mouth daily. 11/16/21  Yes Jonathon Bellows, MD  ondansetron (ZOFRAN-ODT) 4 MG disintegrating tablet Take 1 tablet (4 mg total) by mouth every 8 (eight) hours as needed for nausea or vomiting. 12/07/21  Yes Corena Herter, PA-C  paliperidone (INVEGA) 3 MG 24 hr tablet Take 3 mg by mouth at bedtime.   Yes [provider]  polyethylene glycol (MIRALAX / GLYCOLAX) 17 g packet Take 17 g by mouth daily as needed (constipation.).   Yes [provider]  ramelteon (ROZEREM) 8 MG tablet Take 8 mg by mouth at bedtime.   Yes [provider]  Skin Protectants, Misc. (EUCERIN) cream Apply 1 application topically in the morning and at bedtime. (0800 & 2000)   Yes [provider]  tadalafil (CIALIS) 20 MG tablet Take one tablet at least 60 minutes prior to sexual activity. 09/12/22  Yes Vaillancourt, Aldona Bar, PA-C  tamsulosin (FLOMAX) 0.4 MG CAPS capsule Take 0.4 mg by mouth at bedtime. (2100)   Yes [provider]  tiZANidine (ZANAFLEX) 2 MG tablet Take 2 mg by  mouth at bedtime. (2000)   Yes [provider]  valproic acid (DEPAKENE) 250 MG capsule Take 250 mg by mouth 2 (two) times daily.   Yes [provider]    Allergies as of 08/02/2022 - Review Complete 07/17/2022  Allergen Reaction Noted   Saw palmetto Hives and Rash 02/19/2013   Other Other (See Comments) and Rash 05/25/2011   Sulfa antibiotics Rash 02/11/2013   Sulfacetamide sodium Rash 09/10/2014    Family History  Problem Relation Age of Onset   Kidney cancer Mother    Heart attack Father    Alzheimer's disease Father    Heart attack Sister    Diabetes Mellitus II Sister    Benign prostatic hyperplasia Brother    Prostate cancer Neg Hx    Bladder Cancer Neg Hx     Social History   Socioeconomic History   Marital status: Single    Spouse name: Not on file   Number of children: Not on file   Years of education: Not on file   Highest education level: Not on file  Occupational History   Not on file  Tobacco Use   Smoking status: Former    Types: Cigarettes   Smokeless tobacco: Never  Vaping Use   Vaping Use: Never used  Substance and Sexual Activity   Alcohol use: No   Drug use: No   Sexual activity: Not on file  Other Topics Concern   Not on file  Social History Narrative   Not on file   Social Determinants of Health   Financial Resource Strain: Not on file  Food Insecurity: Not on file  Transportation Needs: Not on file  Physical Activity: Not on file  Stress: Not on file  Social Connections: Not on file  Intimate Partner Violence: Not on file    Review of Systems: See HPI, otherwise negative ROS  Physical Exam: BP 125/70   Pulse 82   Temp 98.2 F (36.8 C) (Temporal)   Resp 20   Ht '5\' 8"'$  (1.727 m)   Wt 93.9 kg   SpO2 100%   BMI 31.47 kg/m  General:   Alert, cooperative in NAD Head:  Normocephalic and atraumatic. Respiratory:  Normal work of breathing. Cardiovascular:  RRR  Impression/Plan: Dakota Harper is here for  cataract surgery.  Risks, benefits, limitations, and alternatives regarding cataract surgery have been reviewed with the patient.  Questions  have been answered.  All parties agreeable.   Benay Pillow, MD  09/25/2022, 10:58 AM

## 2022-09-25 NOTE — Anesthesia Postprocedure Evaluation (Signed)
Anesthesia Post Note  Patient: Dakota Harper  Procedure(s) Performed: CATARACT EXTRACTION PHACO AND INTRAOCULAR LENS PLACEMENT (IOC) LEFT (Left: Eye)  Patient location during evaluation: PACU Anesthesia Type: MAC Level of consciousness: awake and alert Pain management: pain level controlled Vital Signs Assessment: post-procedure vital signs reviewed and stable Respiratory status: spontaneous breathing, nonlabored ventilation, respiratory function stable and patient connected to nasal cannula oxygen Cardiovascular status: stable and blood pressure returned to baseline Postop Assessment: no apparent nausea or vomiting Anesthetic complications: no   No notable events documented.   Last Vitals:  Vitals:   09/25/22 1018  BP: 125/70  Pulse: 82  Resp: 20  Temp: 36.8 C  SpO2: 100%    Last Pain:  Vitals:   09/25/22 1018  TempSrc: Temporal  PainSc: 0-No pain                 Ilene Qua

## 2022-09-25 NOTE — Anesthesia Preprocedure Evaluation (Addendum)
Anesthesia Evaluation  Patient identified by MRN, date of birth, ID band Patient awake    Reviewed: Allergy & Precautions, NPO status , Patient's Chart, lab work & pertinent test results  History of Anesthesia Complications Negative for: history of anesthetic complications  Airway Mallampati: III  TM Distance: >3 FB Neck ROM: full    Dental  (+) Chipped, Poor Dentition, Missing   Pulmonary neg shortness of breath, asthma , sleep apnea , resolved, COPD,  COPD inhaler, former smoker   Pulmonary exam normal        Cardiovascular hypertension, + angina  (-) Past MI and (-) CABG Normal cardiovascular exam     Neuro/Psych  Headaches PSYCHIATRIC DISORDERS      negative neurological ROS  negative psych ROS   GI/Hepatic negative GI ROS, Neg liver ROS,GERD  Controlled,,  Endo/Other  negative endocrine ROSHypothyroidism    Renal/GU Renal disease  negative genitourinary   Musculoskeletal  (+) Arthritis ,    Abdominal   Peds  Hematology negative hematology ROS (+)   Anesthesia Other Findings Patient reports that he does that think that any food or pills are stuck in his throat at this time.  Past Medical History: No date: Asthma 07/04/2021: Basal cell carcinoma     Comment:  L nasal ala. EDC scheduled 08/02/21 No date: BPH (benign prostatic hyperplasia) No date: Colon adenomas No date: COPD (chronic obstructive pulmonary disease) ([place]) 02/28/2021: Dysplastic nevus     Comment:  R scapula, mod atypia No date: Ear pain No date: GERD (gastroesophageal reflux disease) No date: Headache No date: Hypercholesteremia No date: Hypertension No date: Hypothyroidism No date: Neuropathy No date: Sleep apnea No date: Sleep disorder  Past Surgical History: No date: CHOLECYSTECTOMY No date: COLONOSCOPY 08/01/2021: COLONOSCOPY WITH PROPOFOL; N/A     Comment:  Procedure: COLONOSCOPY WITH PROPOFOL;  Surgeon: Jonathon Bellows, MD;  Location: [facility];  Service:               Gastroenterology;  Laterality: N/A; No date: HERNIA REPAIR 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  BMI    Body Mass Index: 31.17 kg/m      Reproductive/Obstetrics negative OB ROS                             Anesthesia Physical Anesthesia Plan  ASA: 3  Anesthesia Plan: MAC   Post-op Pain Management:    Induction: Intravenous  PONV Risk Score and Plan: Midazolam  Airway Management Planned: Natural Airway and Nasal Cannula  Additional Equipment:   Intra-op Plan:   Post-operative Plan:   Informed Consent: I have reviewed the patients History and Physical, chart, labs and discussed the procedure including the risks, benefits and alternatives for the proposed anesthesia with the patient or authorized representative who has indicated his/her understanding and acceptance.     Dental Advisory Given  Plan Discussed with: Anesthesiologist, CRNA and Surgeon  Anesthesia Plan Comments: (Patient consented for risks of anesthesia including but not limited to:  - adverse reactions to medications - risk of airway placement if required - damage to eyes, teeth, lips or other oral mucosa - nerve damage due to positioning  - sore throat or hoarseness - Damage to heart, brain, nerves, lungs, other parts of body or loss of life  Patient voiced understanding.)        Anesthesia Quick Evaluation

## 2022-09-25 NOTE — Op Note (Signed)
OPERATIVE NOTE  Dakota Harper [id number] 09/25/2022   PREOPERATIVE DIAGNOSIS:  Nuclear sclerotic cataract left eye.  H25.12   POSTOPERATIVE DIAGNOSIS:    Nuclear sclerotic cataract left eye.     PROCEDURE:  Phacoemusification with posterior chamber intraocular lens placement of the left eye   LENS:   Implant Name Type Inv. Item Serial No. Manufacturer Lot No. LRB No. Used Action  LENS IOL TECNIS EYHANCE 21.5 - [id number] Intraocular Lens LENS IOL TECNIS EYHANCE 21.5 [id number] [employer]  Left 1 Implanted      Procedure(s) with comments: CATARACT EXTRACTION PHACO AND INTRAOCULAR LENS PLACEMENT (IOC) LEFT (Left) - 2.80 0:27.8  DIB00 +21.5   SURGEON:  Benay Pillow, MD, MPH   ANESTHESIA:  Topical with tetracaine drops augmented with 1% preservative-free intracameral lidocaine.  ESTIMATED BLOOD LOSS: <1 mL   COMPLICATIONS:  None.   DESCRIPTION OF PROCEDURE:  The patient was identified in the holding room and transported to the operating room and placed in the supine position under the operating microscope.  The left eye was identified as the operative eye and it was prepped and draped in the usual sterile ophthalmic fashion.   A 1.0 millimeter clear-corneal paracentesis was made at the 5:00 position. 0.5 ml of preservative-free 1% lidocaine with epinephrine was injected into the anterior chamber.  The anterior chamber was filled with viscoelastic.  A 2.4 millimeter keratome was used to make a near-clear corneal incision at the 2:00 position.  A curvilinear capsulorrhexis was made with a cystotome and capsulorrhexis forceps.  Balanced salt solution was used to hydrodissect and hydrodelineate the nucleus.   Phacoemulsification was then used in stop and chop fashion to remove the lens nucleus and epinucleus.  The remaining cortex was then removed using the irrigation and aspiration handpiece. Viscoelastic was then placed into the capsular bag to distend it for lens placement.  A lens  was then injected into the capsular bag.  The remaining viscoelastic was aspirated.   Wounds were hydrated with balanced salt solution.  The anterior chamber was inflated to a physiologic pressure with balanced salt solution.  Intracameral vigamox 0.1 mL undiltued was injected into the eye and a drop placed onto the ocular surface.  No wound leaks were noted.  The patient was taken to the recovery room in stable condition without complications of anesthesia or surgery  Benay Pillow 09/25/2022, 11:29 AM

## 2022-09-25 NOTE — Transfer of Care (Signed)
Immediate Anesthesia Transfer of Care Note  Patient: Dakota Harper  Procedure(s) Performed: CATARACT EXTRACTION PHACO AND INTRAOCULAR LENS PLACEMENT (IOC) LEFT (Left: Eye)  Patient Location: PACU  Anesthesia Type: MAC  Level of Consciousness: awake, alert  and patient cooperative  Airway and Oxygen Therapy: Patient Spontanous Breathing and Patient connected to supplemental oxygen  Post-op Assessment: Post-op Vital signs reviewed, Patient's Cardiovascular Status Stable, Respiratory Function Stable, Patent Airway and No signs of Nausea or vomiting  Post-op Vital Signs: Reviewed and stable  Complications: No notable events documented.

## 2022-09-26 ENCOUNTER — Encounter: Payer: Self-pay | Admitting: Ophthalmology

## 2022-10-17 ENCOUNTER — Ambulatory Visit: Payer: 59 | Admitting: Urology

## 2022-10-19 ENCOUNTER — Encounter: Payer: Self-pay | Admitting: Podiatry

## 2022-10-19 ENCOUNTER — Ambulatory Visit (INDEPENDENT_AMBULATORY_CARE_PROVIDER_SITE_OTHER): Payer: 59 | Admitting: Podiatry

## 2022-10-19 DIAGNOSIS — N1832 Chronic kidney disease, stage 3b: Secondary | ICD-10-CM | POA: Diagnosis not present

## 2022-10-19 DIAGNOSIS — B351 Tinea unguium: Secondary | ICD-10-CM | POA: Diagnosis not present

## 2022-10-19 DIAGNOSIS — N183 Chronic kidney disease, stage 3 unspecified: Secondary | ICD-10-CM | POA: Diagnosis not present

## 2022-10-19 DIAGNOSIS — M79674 Pain in right toe(s): Secondary | ICD-10-CM

## 2022-10-19 DIAGNOSIS — Z9889 Other specified postprocedural states: Secondary | ICD-10-CM | POA: Diagnosis not present

## 2022-10-19 DIAGNOSIS — M79675 Pain in left toe(s): Secondary | ICD-10-CM

## 2022-10-19 NOTE — Progress Notes (Signed)
This patient returns to my office for at risk foot care.  This patient requires this care by a professional since this patient will be at risk due to having CKD and thrombocytopenia.  This patient is unable to cut nails himself since the patient cannot reach his nails.These nails are painful walking and wearing shoes.  This patient presents for at risk foot care today.  General Appearance  Alert, conversant and in no acute stress.  Vascular  Dorsalis pedis and posterior tibial  pulses are palpable  bilaterally.  Capillary return is within normal limits  bilaterally. Temperature is within normal limits  bilaterally.  Neurologic  Senn-Weinstein monofilament wire test within normal limits  bilaterally. Muscle power within normal limits bilaterally.  Nails Thick disfigured discolored nails with subungual debris  hallux nails bilaterally. No evidence of bacterial infection or drainage bilaterally.  Orthopedic  No limitations of motion  feet .  No crepitus or effusions noted.  No bony pathology or digital deformities noted.  Skin  normotropic skin with no porokeratosis noted bilaterally.  No signs of infections or ulcers noted.     Onychomycosis  Pain in right toes  Pain in left toes  Consent was obtained for treatment procedures.   Mechanical debridement of nails 1-5  bilaterally performed with a nail nipper.  Filed with dremel without incident.    Return office visit  3 months                   Told patient to return for periodic foot care and evaluation due to potential at risk complications.   Gardiner Barefoot DPM

## 2022-11-11 IMAGING — MR MR ANKLE*R* W/O CM
6 series · 40 of 40 positions shown · non-contrast
Comparison: Right ankle radiographs 12/06/2021

CLINICAL DATA: Chronic ankle pain. Osteoarthritis suspected. Turned
ankle when walking about a month ago. Lateral right ankle pain.

EXAM:
MRI OF THE RIGHT ANKLE WITHOUT CONTRAST
TECHNIQUE: Multiplanar, multisequence MR imaging of the ankle was performed. No
intravenous contrast was administered.

[Series 3: PD fat-sat · axial · right · 3.0mm · 0.50mm/px · z∈[-93,+46]mm · 8 of 36 slices shown]
[im 1/36]
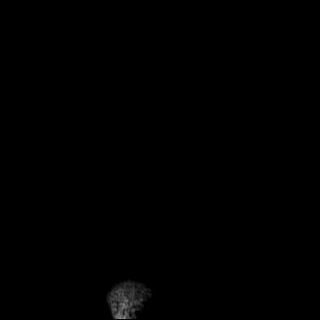
[im 6/36]
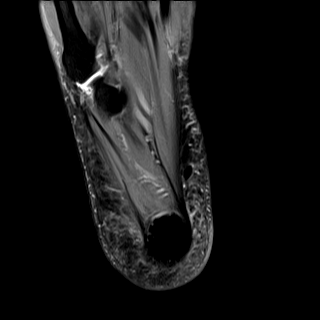
[im 11/36]
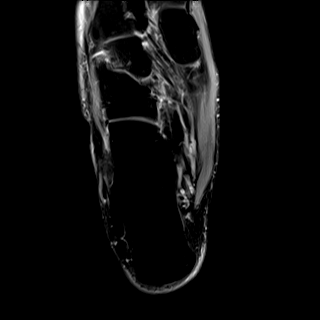
[im 16/36]
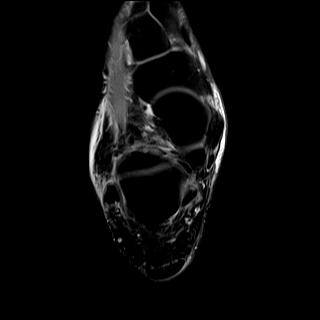
[im 21/36]
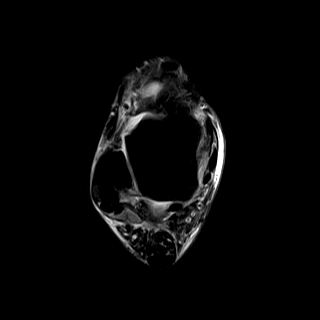
[im 26/36]
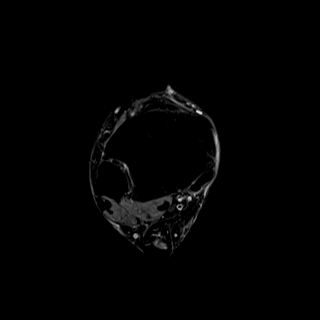
[im 31/36]
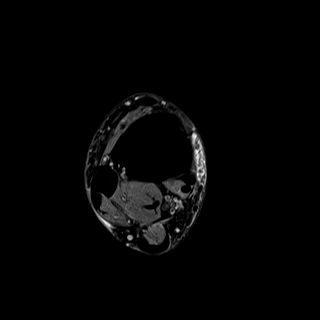
[im 36/36]
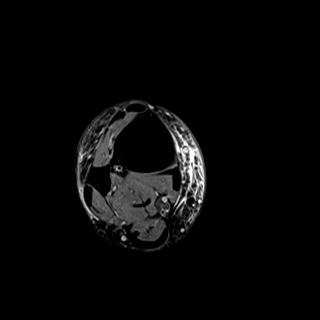

[Series 4: T2 fat-sat · axial · right · 3.0mm · 0.50mm/px · z∈[-93,+46]mm · 8 of 36 slices shown (1 of 2)]
[im 1/36]
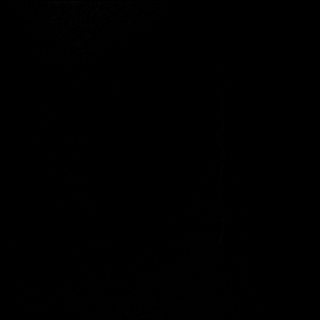
[im 6/36]
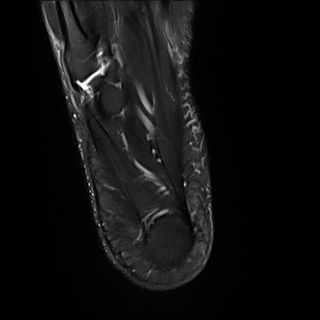
[im 11/36]
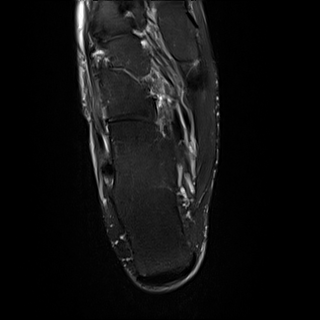
[im 16/36]
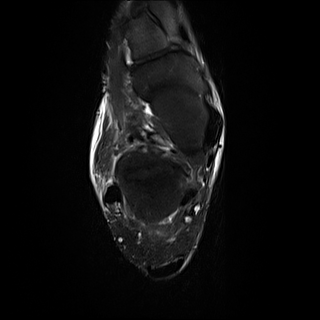
[im 21/36]
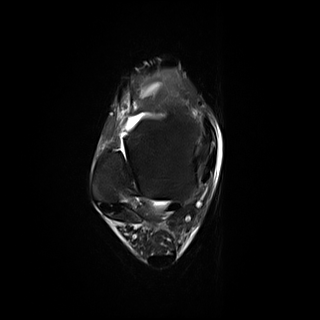
[im 26/36]
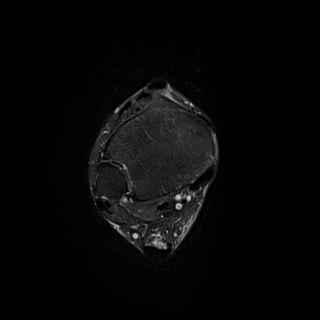
[im 31/36]
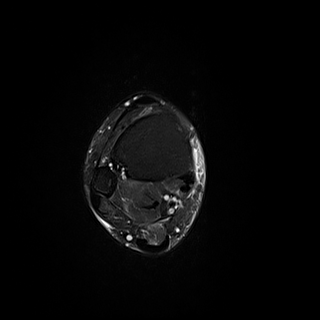
[im 36/36]
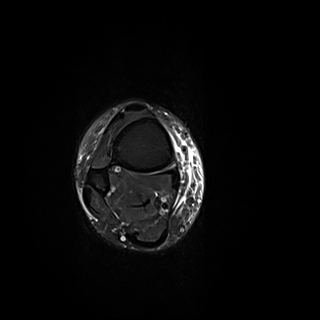

[Series 6: T2 · coronal · right · 3.0mm · 0.62mm/px · 8 of 40 slices shown]
[im 1/40]
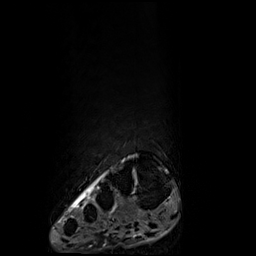
[im 6/40]
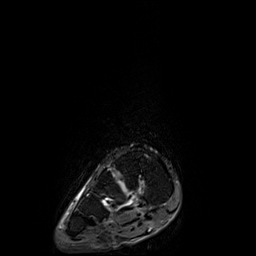
[im 12/40]
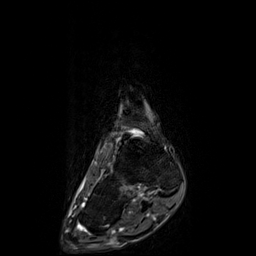
[im 17/40]
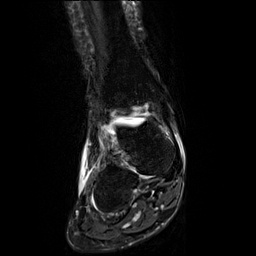
[im 23/40]
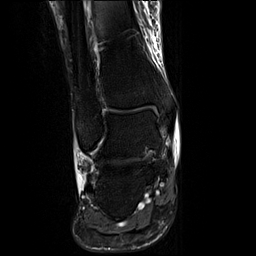
[im 28/40]
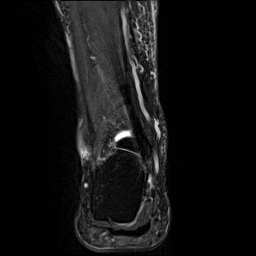
[im 34/40]
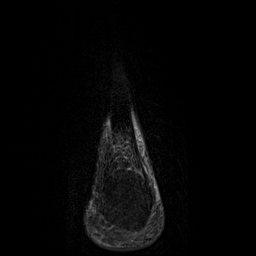
[im 40/40]
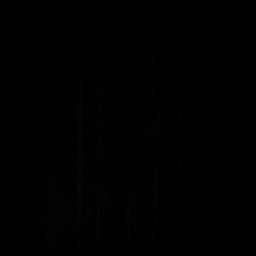

[Series 7: T1 · sagittal · right · 4.0mm · 0.70mm/px · 4 of 21 slices shown]
[im 1/21]
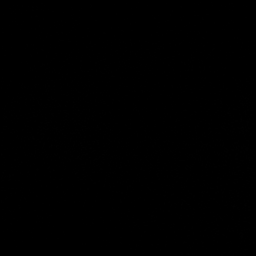
[im 7/21]
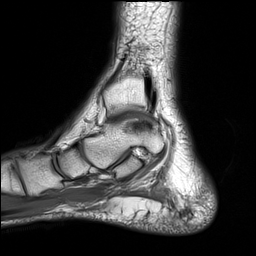
[im 14/21]
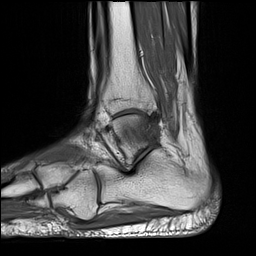
[im 21/21]
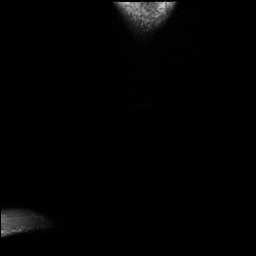

[Series 8: STIR · sagittal · right · 4.0mm · 0.35mm/px · 4 of 21 slices shown]
[im 1/21]
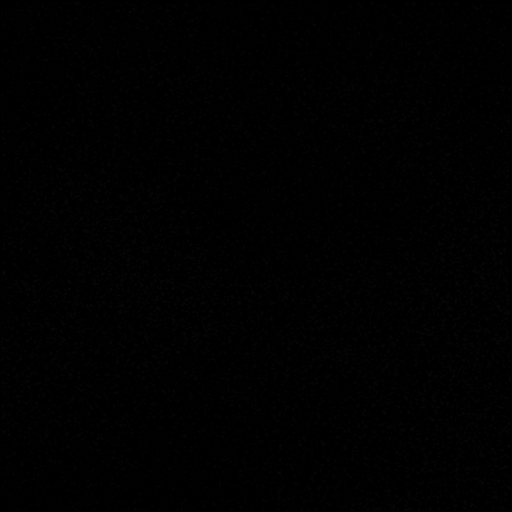
[im 7/21]
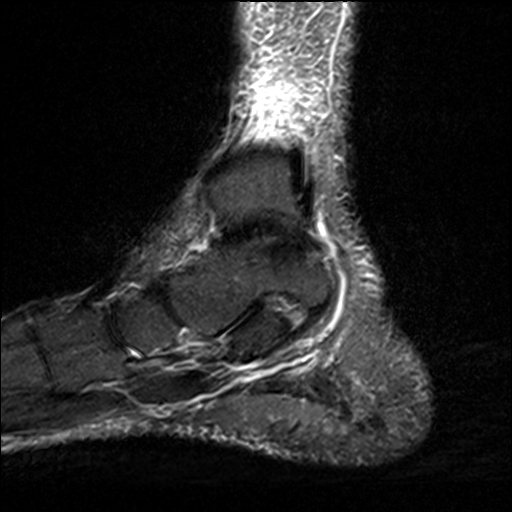
[im 14/21]
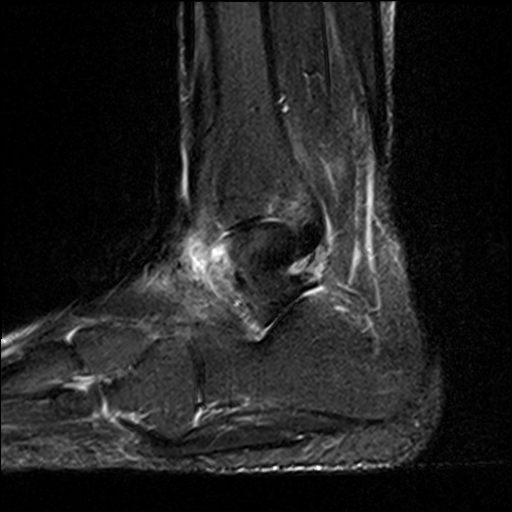
[im 21/21]
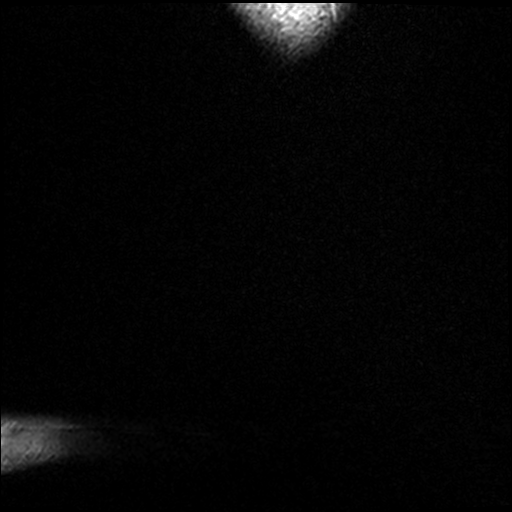

[Series 10: T2 fat-sat · coronal · right · 3.0mm · 0.62mm/px · 8 of 40 slices shown (2 of 2)]
[im 1/40]
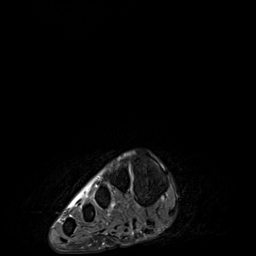
[im 6/40]
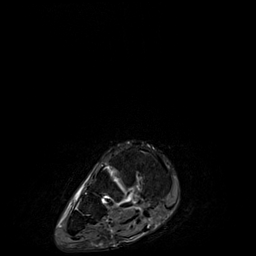
[im 12/40]
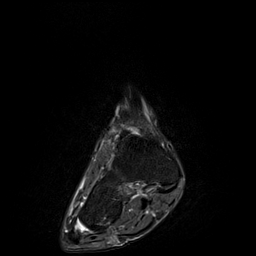
[im 17/40]
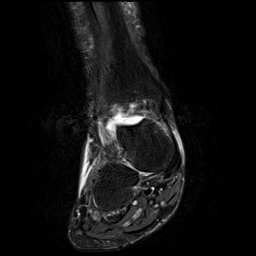
[im 23/40]
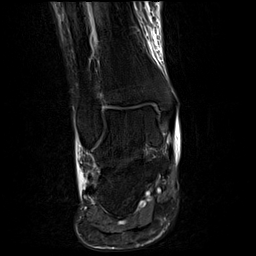
[im 28/40]
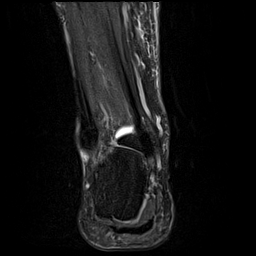
[im 34/40]
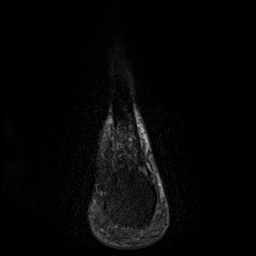
[im 40/40]
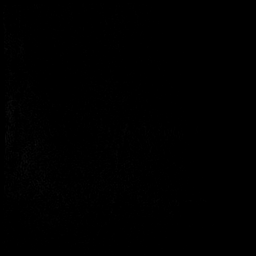

[40 of 40 positions shown; findings below may reference images not displayed]

FINDINGS: TENDONS

Peroneal: The peroneus longus and brevis tendons are intact.

Posteromedial: Intact tibialis posterior, flexor digitorum longus,
and flexor hallucis longus tendons.

Anterior: The tibialis anterior, extensor hallucis longus and
extensor digitorum longus tendons are intact.

Achilles: Intact.

Plantar Fascia: Intact.

LIGAMENTS

Lateral: The anterior and posterior talofibular, anterior and
posterior tibiofibular, and calcaneofibular ligaments are intact.

Medial: The tibiotalar deep deltoid and tibial spring ligaments are
intact.

CARTILAGE

Ankle Joint: Intact cartilage.  Mild tibiotalar joint effusion.

Subtalar Joints/Sinus Tarsi: Mild edema within the sinus tarsi.

Bones: Normal marrow signal. No acute fracture.

Other: The Lisfranc ligament complex is intact. The tarsal tunnel is
unremarkable.

Mild-to-moderate subcutaneous fat edema and swelling within the
distal medial calf and the medial and lateral ankle.

Mild dorsal talonavicular and navicular-cuneiform degenerative
osteophytes.
IMPRESSION: 1.  Intact peroneal tendons and lateral ankle ligaments.

2.  Mild tibiotalar joint effusion.

3. Mild edema within the sinus tarsi as can be seen with sinus tarsi
syndrome.

4. Mild-to-moderate swelling and edema of the subcutaneous fat about
the distal medial calf and the medial and lateral ankle.

5. Mild dorsal talonavicular and navicular-cuneiform degenerative
osteophytes.

## 2023-02-01 ENCOUNTER — Ambulatory Visit (INDEPENDENT_AMBULATORY_CARE_PROVIDER_SITE_OTHER): Payer: 59 | Admitting: Podiatry

## 2023-02-01 ENCOUNTER — Encounter: Payer: Self-pay | Admitting: Podiatry

## 2023-02-01 DIAGNOSIS — M79675 Pain in left toe(s): Secondary | ICD-10-CM | POA: Diagnosis not present

## 2023-02-01 DIAGNOSIS — B351 Tinea unguium: Secondary | ICD-10-CM

## 2023-02-01 DIAGNOSIS — M79674 Pain in right toe(s): Secondary | ICD-10-CM | POA: Diagnosis not present

## 2023-02-01 DIAGNOSIS — N183 Chronic kidney disease, stage 3 unspecified: Secondary | ICD-10-CM

## 2023-02-01 NOTE — Progress Notes (Signed)
This patient returns to my office for at risk foot care.  This patient requires this care by a professional since this patient will be at risk due to having CKD and thrombocytopenia.  This patient is unable to cut nails himself since the patient cannot reach his nails.These nails are painful walking and wearing shoes.  This patient presents for at risk foot care today.  General Appearance  Alert, conversant and in no acute stress.  Vascular  Dorsalis pedis and posterior tibial  pulses are palpable  bilaterally.  Capillary return is within normal limits  bilaterally. Temperature is within normal limits  bilaterally.  Neurologic  Senn-Weinstein monofilament wire test within normal limits  bilaterally. Muscle power within normal limits bilaterally.  Nails Thick disfigured discolored nails with subungual debris  hallux nails bilaterally. No evidence of bacterial infection or drainage bilaterally.  Orthopedic  No limitations of motion  feet .  No crepitus or effusions noted.  No bony pathology or digital deformities noted.  Skin  normotropic skin with no porokeratosis noted bilaterally.  No signs of infections or ulcers noted.     Onychomycosis  Pain in right toes  Pain in left toes  Consent was obtained for treatment procedures.   Mechanical debridement of nails 1-5  bilaterally performed with a nail nipper.  Filed with dremel without incident.    Return office visit  3 months                   Told patient to return for periodic foot care and evaluation due to potential at risk complications.   Paizlee Kinder DPM   

## 2023-03-08 ENCOUNTER — Ambulatory Visit (INDEPENDENT_AMBULATORY_CARE_PROVIDER_SITE_OTHER): Payer: 59 | Admitting: Dermatology

## 2023-03-08 ENCOUNTER — Encounter: Payer: Self-pay | Admitting: Dermatology

## 2023-03-08 DIAGNOSIS — L578 Other skin changes due to chronic exposure to nonionizing radiation: Secondary | ICD-10-CM

## 2023-03-08 DIAGNOSIS — L570 Actinic keratosis: Secondary | ICD-10-CM

## 2023-03-08 DIAGNOSIS — W908XXA Exposure to other nonionizing radiation, initial encounter: Secondary | ICD-10-CM

## 2023-03-08 DIAGNOSIS — L219 Seborrheic dermatitis, unspecified: Secondary | ICD-10-CM

## 2023-03-08 DIAGNOSIS — B880 Other acariasis: Secondary | ICD-10-CM | POA: Diagnosis not present

## 2023-03-08 DIAGNOSIS — L821 Other seborrheic keratosis: Secondary | ICD-10-CM

## 2023-03-08 DIAGNOSIS — D225 Melanocytic nevi of trunk: Secondary | ICD-10-CM | POA: Diagnosis not present

## 2023-03-08 DIAGNOSIS — D229 Melanocytic nevi, unspecified: Secondary | ICD-10-CM | POA: Diagnosis not present

## 2023-03-08 DIAGNOSIS — X32XXXA Exposure to sunlight, initial encounter: Secondary | ICD-10-CM

## 2023-03-08 DIAGNOSIS — D492 Neoplasm of unspecified behavior of bone, soft tissue, and skin: Secondary | ICD-10-CM

## 2023-03-08 MED ORDER — PERMETHRIN 5 % EX CREA: 1.0000 | TOPICAL_CREAM | Freq: Two times a day (BID) | CUTANEOUS | 1 refills | Status: DC

## 2023-03-08 NOTE — Patient Instructions (Addendum)
For scalp, apply permethrin cream twice a day   You can also use OTC Gold Bond Rapid Relief Anti-Itch cream (pramoxine + menthol), CeraVe Anti-itch cream or lotion (pramoxine), Sarna lotion (Original- menthol + camphor or Sensitive- pramoxine) or Eucerin 12 hour Itch Relief lotion (menthol) up to 3 times per day to areas on body that are itchy.   Wound Care Instructions  Cleanse wound gently with soap and water once a day then pat dry with clean gauze. Apply a thin coat of Petrolatum (petroleum jelly, "Vaseline") over the wound (unless you have an allergy to this). We recommend that you use a new, sterile tube of Vaseline. Do not pick or remove scabs. Do not remove the yellow or white "healing tissue" from the base of the wound.  Cover the wound with fresh, clean, nonstick gauze and secure with paper tape. You may use Band-Aids in place of gauze and tape if the wound is small enough, but would recommend trimming much of the tape off as there is often too much. Sometimes Band-Aids can irritate the skin.  You should call the office for your biopsy report after 1 week if you have not already been contacted.  If you experience any problems, such as abnormal amounts of bleeding, swelling, significant bruising, significant pain, or evidence of infection, please call the office immediately.  FOR ADULT SURGERY PATIENTS: If you need something for pain relief you may take 1 extra strength Tylenol (acetaminophen) AND 2 Ibuprofen (200mg  each) together every 4 hours as needed for pain. (do not take these if you are allergic to them or if you have a reason you should not take them.) Typically, you may only need pain medication for 1 to 3 days.   Due to recent changes in healthcare laws, you may see results of your pathology and/or laboratory studies on MyChart before the doctors have had a chance to review them. We understand that in some cases there may be results that are confusing or concerning to you. Please  understand that not all results are received at the same time and often the doctors may need to interpret multiple results in order to provide you with the best plan of care or course of treatment. Therefore, we ask that you please give [place] 2 business days to thoroughly review all your results before contacting the office for clarification. Should we see a critical lab result, you will be contacted sooner.   If You Need Anything After Your Visit  If you have any questions or concerns for your doctor, please call our main line at [phone] and press option 4 to reach your doctor's medical assistant. If no one answers, please leave a voicemail as directed and we will return your call as soon as possible. Messages left after 4 pm will be answered the following business day.   You may also send [place] a message via MyChart. We typically respond to MyChart messages within 1-2 business days.  For prescription refills, please ask your pharmacy to contact our office. Our fax number is [phone].  If you have an urgent issue when the clinic is closed that cannot wait until the next business day, you can page your doctor at the number below.    Please note that while we do our best to be available for urgent issues outside of office hours, we are not available 24/7.   If you have an urgent issue and are unable to reach [place], you may choose to seek medical care  at your doctor's office, retail clinic, urgent care center, or emergency room.  If you have a medical emergency, please immediately call 911 or go to the emergency department.  Pager Numbers  - Dr. Gwen Pounds: [phone]  - Dr. Neale Burly: [phone]  - Dr. Roseanne Reno: [phone]  In the event of inclement weather, please call our main line at [phone] for an update on the status of any delays or closures.  Dermatology Medication Tips: Please keep the boxes that topical medications come in in order to help keep track of the instructions about  where and how to use these. Pharmacies typically print the medication instructions only on the boxes and not directly on the medication tubes.   If your medication is too expensive, please contact our office at [phone] option 4 or send [place] a message through MyChart.   We are unable to tell what your co-pay for medications will be in advance as this is different depending on your insurance coverage. However, we may be able to find a substitute medication at lower cost or fill out paperwork to get insurance to cover a needed medication.   If a prior authorization is required to get your medication covered by your insurance company, please allow [place] 1-2 business days to complete this process.  Drug prices often vary depending on where the prescription is filled and some pharmacies may offer cheaper prices.  The website www.goodrx.com contains coupons for medications through different pharmacies. The prices here do not account for what the cost may be with help from insurance (it may be cheaper with your insurance), but the website can give you the price if you did not use any insurance.  - You can print the associated coupon and take it with your prescription to the pharmacy.  - You may also stop by our office during regular business hours and pick up a GoodRx coupon card.  - If you need your prescription sent electronically to a different pharmacy, notify our office through [facility] or by phone at [phone] option 4.

## 2023-03-08 NOTE — Progress Notes (Signed)
Follow-Up Visit   Subjective  Dakota Harper is a 77 y.o. male who presents for the following: itching at scalp. He also wants a check of moles at his back.  The patient has spots, moles and lesions to be evaluated, some may be new or changing and the patient may have concern these could be cancer.  The following portions of the chart were reviewed this encounter and updated as appropriate: medications, allergies, medical history  Review of Systems:  No other skin or systemic complaints except as noted in HPI or Assessment and Plan.  Objective  Well appearing patient in no apparent distress; mood and affect are within normal limits.   A focused examination was performed of the following areas: Face, scalp  Relevant exam findings are noted in the Assessment and Plan.  hypertrophic at scalp x 1, left brow x 1 (2) Erythematous thin papules/macules with gritty scale.   Scalp Follicular spines, KOH + for demodex       Left Upper Back 0.4 cm medium to dark brown thin papule     Left Chest 0.5 cm blue black thin papule R/o Atypia vs MM     Right Mid Chest 0.4 cm gray brown thin papule R/o Atypia       Assessment & Plan     Seborrheic dermatitis  Related Medications ketoconazole (NIZORAL) 2 % shampoo Massage into scalp daily and let sit several minutes before rinsing.  Fluocinolone Acetonide Body 0.01 % OIL Apply to scalp 1-2 times a day and leave on.  AK (actinic keratosis) (2) hypertrophic at scalp x 1, left brow x 1  Actinic keratoses are precancerous spots that appear secondary to cumulative UV radiation exposure/sun exposure over time. They are chronic with expected duration over 1 year. A portion of actinic keratoses will progress to squamous cell carcinoma of the skin. It is not possible to reliably predict which spots will progress to skin cancer and so treatment is recommended to prevent development of skin cancer.  Recommend daily broad  spectrum sunscreen SPF 30+ to sun-exposed areas, reapply every 2 hours as needed.  Recommend staying in the shade or wearing long sleeves, sun glasses (UVA+UVB protection) and wide brim hats (4-inch brim around the entire circumference of the hat). Call for new or changing lesions.  Prior to procedure, discussed risks of blister formation, small wound, skin dyspigmentation, or rare scar following cryotherapy. Recommend Vaseline ointment to treated areas while healing.   Destruction of lesion - hypertrophic at scalp x 1, left brow x 1  Destruction method: cryotherapy   Informed consent: discussed and consent obtained   Lesion destroyed using liquid nitrogen: Yes   Cryotherapy cycles:  2 Outcome: patient tolerated procedure well with no complications   Post-procedure details: wound care instructions given   Additional details:  Prior to procedure, discussed risks of blister formation, small wound, skin dyspigmentation, or rare scar following cryotherapy. Recommend Vaseline ointment to treated areas while healing.   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 Now with demodex overgrowth/side effect of treatment of chronic condition.  Significantly itchy and bothersome for patient  D/c topical steroids  Start permethrin twice daily to affected areas at scalp and face x 2 months  Recommend OTC Gold Bond Rapid Relief Anti-Itch cream (pramoxine + menthol), CeraVe Anti-itch cream or lotion (pramoxine), Sarna lotion (Original- menthol + camphor or Sensitive- pramoxine) or Eucerin 12 hour Itch Relief lotion (menthol) up to 3 times per day to areas  on body that are itchy.   permethrin (ELIMITE) 5 % cream - Scalp Apply 1 Application topically in the morning and at bedtime. To face and scalp  Neoplasm of skin (3) Left Upper Back  Epidermal / dermal shaving  Lesion diameter (cm):  0.4 Informed consent: discussed and consent obtained   Timeout: patient  name, date of birth, surgical site, and procedure verified   Anesthesia: the lesion was anesthetized in a standard fashion   Anesthetic:  1% lidocaine w/ epinephrine 1-100,000 local infiltration Instrument used: flexible razor blade   Hemostasis achieved with: aluminum chloride   Outcome: patient tolerated procedure well   Post-procedure details: wound care instructions given   Additional details:  Mupirocin and a bandage applied  Specimen 1 - Surgical pathology Differential Diagnosis: r/o Atypia  Check Margins: No 0.4 cm medium to dark brown thin papule  Left Chest  Epidermal / dermal shaving  Lesion diameter (cm):  0.5 Informed consent: discussed and consent obtained   Timeout: patient name, date of birth, surgical site, and procedure verified   Anesthesia: the lesion was anesthetized in a standard fashion   Anesthetic:  1% lidocaine w/ epinephrine 1-100,000 local infiltration Instrument used: flexible razor blade   Hemostasis achieved with: aluminum chloride   Outcome: patient tolerated procedure well   Post-procedure details: wound care instructions given   Additional details:  Mupirocin and a bandage applied  Specimen 2 - Surgical pathology Differential Diagnosis: R/o Atypia  Check Margins: No 0.5 cm blue black thin papule   Right Mid Chest  Epidermal / dermal shaving  Lesion diameter (cm):  0.4 Informed consent: discussed and consent obtained   Timeout: patient name, date of birth, surgical site, and procedure verified   Anesthesia: the lesion was anesthetized in a standard fashion   Anesthetic:  1% lidocaine w/ epinephrine 1-100,000 local infiltration Instrument used: flexible razor blade   Hemostasis achieved with: aluminum chloride   Outcome: patient tolerated procedure well   Post-procedure details: wound care instructions given   Additional details:  Mupirocin and a bandage applied  Specimen 3 - Surgical pathology Differential Diagnosis: R/o  Atypia  Check Margins: No 0.4 cm gray brown thin papule   SEBORRHEIC KERATOSIS - Stuck-on, waxy, tan-brown papules and/or plaques  - Benign-appearing - Discussed benign etiology and prognosis. - Observe - Call for any changes  MELANOCYTIC NEVI Exam: Tan-brown and/or pink-flesh-colored symmetric macules and papules  Treatment Plan: Benign appearing on exam today. Recommend observation. Call clinic for new or changing moles. Recommend daily use of broad spectrum spf 30+ sunscreen to sun-exposed areas.   ACTINIC DAMAGE - chronic, secondary to cumulative UV radiation exposure/sun exposure over time - diffuse scaly erythematous macules with underlying dyspigmentation - Recommend daily broad spectrum sunscreen SPF 30+ to sun-exposed areas, reapply every 2 hours as needed.  - Recommend staying in the shade or wearing long sleeves, sun glasses (UVA+UVB protection) and wide brim hats (4-inch brim around the entire circumference of the hat). - Call for new or changing lesions.   Return for TBSE, as scheduled.  Anise Salvo, RMA, am acting as scribe for Darden Dates, MD .   Documentation: I have reviewed the above documentation for accuracy and completeness, and I agree with the above.  Darden Dates, MD

## 2023-03-19 ENCOUNTER — Other Ambulatory Visit: Payer: Self-pay | Admitting: Dermatology

## 2023-03-19 ENCOUNTER — Telehealth: Payer: Self-pay

## 2023-03-19 NOTE — Telephone Encounter (Addendum)
Called and spoke with patient regarding results. Patient verbalized understanding and denied further questions.  Patient would like to scheduled surgery excision for left upper back.     ----- Message from Sandi Mealy, MD sent at 03/14/2023  5:32 PM EDT ----- 1. 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 On the spectrum from normal mole to melanoma skin cancer, this is in between the two but closer towards a melanoma skin cancer.  - The treatment of choice for severely atypical moles is to cut them out in clinic with an area of normal looking skin around them to get all the atypical cells out. The skin that is removed will be sent to check under the microscope again to be sure it looks completely out.   - People who have a history of atypical moles do have a slightly increased risk of developing melanoma somewhere on the body, so a full body skin exam by a dermatologist is recommended at least once a year. - Monthly self skin checks and daily sun protection are also recommended.  - Please also call if you notice any new or changing spots anywhere else on the body before your follow-up visit.   2. Skin , left chest MELANOCYTIC NEVUS, INTRADERMAL TYPE, CLOSE TO MARGIN  --> This is a NORMAL MOLE. No additional treatment is needed. If you notice any new or changing spots or have other skin concerns in future, please call our office at [phone].     3. Skin , right mid chest COMBINED MELANOCYTIC NEVUS, CLOSE TO MARGIN, SEE DESCRIPTION  --> This is a NORMAL MOLE. No additional treatment is needed. If you notice any new or changing spots or have other skin concerns in future, please call our office at [phone].     MAs please call with results and schedule. Thank you!

## 2023-04-04 ENCOUNTER — Ambulatory Visit (INDEPENDENT_AMBULATORY_CARE_PROVIDER_SITE_OTHER): Payer: 59 | Admitting: Dermatology

## 2023-04-04 DIAGNOSIS — D235 Other benign neoplasm of skin of trunk: Secondary | ICD-10-CM

## 2023-04-04 DIAGNOSIS — D239 Other benign neoplasm of skin, unspecified: Secondary | ICD-10-CM

## 2023-04-04 NOTE — Progress Notes (Signed)
   Follow-Up Visit   Subjective  Dakota Harper is a 77 y.o. male who presents for the following: Procedure (Severe Dysplastic Nevus, biopsy proven, of the left upper back. Patient presents for excision. ).   The following portions of the chart were reviewed this encounter and updated as appropriate:       Review of Systems:  No other skin or systemic complaints except as noted in HPI or Assessment and Plan.  Objective  Well appearing patient in no apparent distress; mood and affect are within normal limits.  A focused examination was performed including face, back. Relevant physical exam findings are noted in the Assessment and Plan.  Left Upper Back Pink biopsy site.    Assessment & Plan  Dysplastic nevus Left Upper Back  Severe, biopsy proven.  Skin excision - Left Upper Back  Lesion length (cm):  0.7 Lesion width (cm):  0.7 Margin per side (cm):  0.2 Total excision diameter (cm):  1.1 Informed consent: discussed and consent obtained   Timeout: patient name, date of birth, surgical site, and procedure verified   Procedure prep:  Patient was prepped and draped in usual sterile fashion Prep type:  Povidone-iodine Anesthesia: the lesion was anesthetized in a standard fashion   Anesthetic:  1% lidocaine w/ epinephrine 1-100,000 buffered w/ 8.4% NaHCO3 (Total 15cc - 9cc lido w/epi, 6cc bupivacaine) Instrument used: #15 blade   Hemostasis achieved with: pressure and electrodesiccation   Outcome: patient tolerated procedure well with no complications   Additional details:  Tagged at 12 o'clock tip  Skin repair - Left Upper Back Complexity:  Intermediate Final length (cm):  2.8 Informed consent: discussed and consent obtained   Reason for type of repair: reduce tension to allow closure, reduce the risk of dehiscence, infection, and necrosis, reduce subcutaneous dead space and avoid a hematoma, preserve normal anatomical and functional relationships and enhance both  functionality and cosmetic results   Undermining: edges undermined   Subcutaneous layers (deep stitches):  Suture size:  3-0 Suture type: Vicryl (polyglactin 910)   Stitches:  Buried vertical mattress Fine/surface layer approximation (top stitches):  Suture size:  3-0 Suture type: nylon   Stitches: simple interrupted   Suture removal (days):  7 Hemostasis achieved with: suture Outcome: patient tolerated procedure well with no complications   Post-procedure details: sterile dressing applied and wound care instructions given   Dressing type: pressure dressing (mupirocin)    Specimen 1 - Surgical pathology Differential Diagnosis: Severe Dysplastic Nevus Check Margins: Yes [id number]  Tagged at 12 o'clock tip   Return in about 1 week (around 04/11/2023) for suture removal.  I, Cherlyn Labella, CMA, am acting as scribe for Willeen Niece, MD .  Documentation: I have reviewed the above documentation for accuracy and completeness, and I agree with the above.  Willeen Niece MD

## 2023-04-04 NOTE — Patient Instructions (Signed)
Wound Care Instructions for After Surgery  On the day following your surgery, you should begin doing daily dressing changes until your sutures are removed: Remove the bandage. Cleanse the wound gently with soap and water.  Make sure you then dry the skin surrounding the wound completely or the tape will not stick to the skin. Do not use cotton balls on the wound. After the wound is clean and dry, apply the ointment (either prescription antibiotic prescribed by your doctor or plain Vaseline if nothing was prescribed) gently with a Q-tip. If you are using a bandaid to cover: Apply a bandaid large enough to cover the entire wound. If you do not have a bandaid large enough to cover the wound OR if you are sensitive to bandaid adhesive: Cut a non-stick pad (such as Telfa) to fit the size of the wound.  Cover the wound with the non-stick pad. If the wound is draining, you may want to add a small amount of gauze on top of the non-stick pad for a little added compression to the area. Use tape to seal the area completely.  For the next 1-2 weeks: Be sure to keep the wound moist with ointment 24/7 to ensure best healing. If you are unable to cover the wound with a bandage to hold the ointment in place, you may need to reapply the ointment several times a day. Do not bend over or lift heavy items to reduce the chance of elevated blood pressure to the wound. Do not participate in particularly strenuous activities.  Below is a list of dressing supplies you might need.  Cotton-tipped applicators - Q-tips Gauze pads (2x2 and/or 4x4) - All-Purpose Sponges New and clean tube of petroleum jelly (Vaseline) OR prescription antibiotic ointment if prescribed Either a bandaid large enough to cover the entire wound OR non-stick dressing material (Telfa) and Tape (Paper or Hypafix)  FOR ADULT SURGERY PATIENTS: If you need something for pain relief, you may take 1 extra strength Tylenol (acetaminophen) and 2  ibuprofen (200 mg) together every 4 hours as needed. (Do not take these medications if you are allergic to them or if you know you cannot take them for any other reason). Typically you may only need pain medication for 1-3 days.   Comments on the Post-Operative Period Slight swelling and redness often appear around the wound. This is normal and will disappear within several days following the surgery. The healing wound will drain a brownish-red-yellow discharge during healing. This is a normal phase of wound healing. As the wound begins to heal, the drainage may increase in amount. Again, this drainage is normal. Notify us if the drainage becomes persistently bloody, excessively swollen, or intensely painful or develops a foul odor or red streaks.  The healing wound will also typically be itchy. This is normal. If you have severe or persistent pain, Notify us if the discomfort is severe or persistent. Avoid alcoholic beverages when taking pain medicine.  In Case of Wound Hemorrhage A wound hemorrhage is when the bandage suddenly becomes soaked with bright red blood and flows profusely. If this happens, sit down or lie down with your head elevated. If the wound has a dressing on it, do not remove the dressing. Apply pressure to the existing gauze. If the wound is not covered, use a gauze pad to apply pressure and continue applying the pressure for 20 minutes without peeking. DO NOT COVER THE WOUND WITH A LARGE TOWEL OR WASH CLOTH. Release your hand from the   wound site but do not remove the dressing. If the bleeding has stopped, gently clean around the wound. Leave the dressing in place for 24 hours if possible. This wait time allows the blood vessels to close off so that you do not spark a new round of bleeding by disrupting the newly clotted blood vessels with an immediate dressing change. If the bleeding does not subside, continue to hold pressure for 40 minutes. If bleeding continues, page your  physician, contact an After Hours clinic or go to the Emergency Room.   Due to recent changes in healthcare laws, you may see results of your pathology and/or laboratory studies on MyChart before the doctors have had a chance to review them. We understand that in some cases there may be results that are confusing or concerning to you. Please understand that not all results are received at the same time and often the doctors may need to interpret multiple results in order to provide you with the best plan of care or course of treatment. Therefore, we ask that you please give us 2 business days to thoroughly review all your results before contacting the office for clarification. Should we see a critical lab result, you will be contacted sooner.   If You Need Anything After Your Visit  If you have any questions or concerns for your doctor, please call our main line at [phone] and press option 4 to reach your doctor's medical assistant. If no one answers, please leave a voicemail as directed and we will return your call as soon as possible. Messages left after 4 pm will be answered the following business day.   You may also send us a message via MyChart. We typically respond to MyChart messages within 1-2 business days.  For prescription refills, please ask your pharmacy to contact our office. Our fax number is [phone].  If you have an urgent issue when the clinic is closed that cannot wait until the next business day, you can page your doctor at the number below.    Please note that while we do our best to be available for urgent issues outside of office hours, we are not available 24/7.   If you have an urgent issue and are unable to reach us, you may choose to seek medical care at your doctor's office, retail clinic, urgent care center, or emergency room.  If you have a medical emergency, please immediately call 911 or go to the emergency department.  Pager Numbers  - Dr. Kowalski:  [phone]  - Dr. Moye: [phone]  - Dr. Stewart: [phone]  In the event of inclement weather, please call our main line at [phone] for an update on the status of any delays or closures.  Dermatology Medication Tips: Please keep the boxes that topical medications come in in order to help keep track of the instructions about where and how to use these. Pharmacies typically print the medication instructions only on the boxes and not directly on the medication tubes.   If your medication is too expensive, please contact our office at [phone] option 4 or send us a message through MyChart.   We are unable to tell what your co-pay for medications will be in advance as this is different depending on your insurance coverage. However, we may be able to find a substitute medication at lower cost or fill out paperwork to get insurance to cover a needed medication.   If a prior authorization is required to get your medication covered   by your insurance company, please allow us 1-2 business days to complete this process.  Drug prices often vary depending on where the prescription is filled and some pharmacies may offer cheaper prices.  The website www.goodrx.com contains coupons for medications through different pharmacies. The prices here do not account for what the cost may be with help from insurance (it may be cheaper with your insurance), but the website can give you the price if you did not use any insurance.  - You can print the associated coupon and take it with your prescription to the pharmacy.  - You may also stop by our office during regular business hours and pick up a GoodRx coupon card.  - If you need your prescription sent electronically to a different pharmacy, notify our office through [facility] MyChart or by phone at [phone] option 4.     Si Usted Necesita Algo Despus de Su Visita  Tambin puede enviarnos un mensaje a travs de MyChart. Por lo general  respondemos a los mensajes de MyChart en el transcurso de 1 a 2 das hbiles.  Para renovar recetas, por favor pida a su farmacia que se ponga en contacto con nuestra oficina. Nuestro nmero de fax es el [phone].  Si tiene un asunto urgente cuando la clnica est cerrada y que no puede esperar hasta el siguiente da hbil, puede llamar/localizar a su doctor(a) al nmero que aparece a continuacin.   Por favor, tenga en cuenta que aunque hacemos todo lo posible para estar disponibles para asuntos urgentes fuera del horario de oficina, no estamos disponibles las 24 horas del da, los 7 das de la semana.   Si tiene un problema urgente y no puede comunicarse con nosotros, puede optar por buscar atencin mdica  en el consultorio de su doctor(a), en una clnica privada, en un centro de atencin urgente o en una sala de emergencias.  Si tiene una emergencia mdica, por favor llame inmediatamente al 911 o vaya a la sala de emergencias.  Nmeros de bper  - Dr. Kowalski: [phone]  - Dra. Moye: [phone]  - Dra. Stewart: [phone]  En caso de inclemencias del tiempo, por favor llame a nuestra lnea principal al [phone] para una actualizacin sobre el estado de cualquier retraso o cierre.  Consejos para la medicacin en dermatologa: Por favor, guarde las cajas en las que vienen los medicamentos de uso tpico para ayudarle a seguir las instrucciones sobre dnde y cmo usarlos. Las farmacias generalmente imprimen las instrucciones del medicamento slo en las cajas y no directamente en los tubos del medicamento.   Si su medicamento es muy caro, por favor, pngase en contacto con nuestra oficina llamando al [phone] y presione la opcin 4 o envenos un mensaje a travs de MyChart.   No podemos decirle cul ser su copago por los medicamentos por adelantado ya que esto es diferente dependiendo de la cobertura de su seguro. Sin embargo, es posible que podamos encontrar un  medicamento sustituto a menor costo o llenar un formulario para que el seguro cubra el medicamento que se considera necesario.   Si se requiere una autorizacin previa para que su compaa de seguros cubra su medicamento, por favor permtanos de 1 a 2 das hbiles para completar este proceso.  Los precios de los medicamentos varan con frecuencia dependiendo del lugar de dnde se surte la receta y alguna farmacias pueden ofrecer precios ms baratos.  El sitio web www.goodrx.com tiene cupones para medicamentos de diferentes farmacias. Los precios aqu no   tienen en cuenta lo que podra costar con la ayuda del seguro (puede ser ms barato con su seguro), pero el sitio web puede darle el precio si no utiliz ningn seguro.  - Puede imprimir el cupn correspondiente y llevarlo con su receta a la farmacia.  - Tambin puede pasar por nuestra oficina durante el horario de atencin regular y recoger una tarjeta de cupones de GoodRx.  - Si necesita que su receta se enve electrnicamente a una farmacia diferente, informe a nuestra oficina a travs de MyChart de [place] o por telfono llamando al [phone] y presione la opcin 4.   

## 2023-04-05 ENCOUNTER — Telehealth: Payer: Self-pay

## 2023-04-05 NOTE — Telephone Encounter (Signed)
Patient doing well after surgery.

## 2023-04-11 ENCOUNTER — Ambulatory Visit (INDEPENDENT_AMBULATORY_CARE_PROVIDER_SITE_OTHER): Payer: 59 | Admitting: Dermatology

## 2023-04-11 DIAGNOSIS — D239 Other benign neoplasm of skin, unspecified: Secondary | ICD-10-CM

## 2023-04-11 NOTE — Patient Instructions (Signed)
Due to recent changes in healthcare laws, you may see results of your pathology and/or laboratory studies on MyChart before the doctors have had a chance to review them. We understand that in some cases there may be results that are confusing or concerning to you. Please understand that not all results are received at the same time and often the doctors may need to interpret multiple results in order to provide you with the best plan of care or course of treatment. Therefore, we ask that you please give us 2 business days to thoroughly review all your results before contacting the office for clarification. Should we see a critical lab result, you will be contacted sooner.   If You Need Anything After Your Visit  If you have any questions or concerns for your doctor, please call our main line at [phone] and press option 4 to reach your doctor's medical assistant. If no one answers, please leave a voicemail as directed and we will return your call as soon as possible. Messages left after 4 pm will be answered the following business day.   You may also send us a message via MyChart. We typically respond to MyChart messages within 1-2 business days.  For prescription refills, please ask your pharmacy to contact our office. Our fax number is [phone].  If you have an urgent issue when the clinic is closed that cannot wait until the next business day, you can page your doctor at the number below.    Please note that while we do our best to be available for urgent issues outside of office hours, we are not available 24/7.   If you have an urgent issue and are unable to reach us, you may choose to seek medical care at your doctor's office, retail clinic, urgent care center, or emergency room.  If you have a medical emergency, please immediately call 911 or go to the emergency department.  Pager Numbers  - Dr. Kowalski: [phone]  - Dr. Moye: [phone]  - Dr. Stewart:  [phone]  In the event of inclement weather, please call our main line at [phone] for an update on the status of any delays or closures.  Dermatology Medication Tips: Please keep the boxes that topical medications come in in order to help keep track of the instructions about where and how to use these. Pharmacies typically print the medication instructions only on the boxes and not directly on the medication tubes.   If your medication is too expensive, please contact our office at [phone] option 4 or send us a message through MyChart.   We are unable to tell what your co-pay for medications will be in advance as this is different depending on your insurance coverage. However, we may be able to find a substitute medication at lower cost or fill out paperwork to get insurance to cover a needed medication.   If a prior authorization is required to get your medication covered by your insurance company, please allow us 1-2 business days to complete this process.  Drug prices often vary depending on where the prescription is filled and some pharmacies may offer cheaper prices.  The website www.goodrx.com contains coupons for medications through different pharmacies. The prices here do not account for what the cost may be with help from insurance (it may be cheaper with your insurance), but the website can give you the price if you did not use any insurance.  - You can print the associated coupon and take it with   your prescription to the pharmacy.  - You may also stop by our office during regular business hours and pick up a GoodRx coupon card.  - If you need your prescription sent electronically to a different pharmacy, notify our office through [facility] MyChart or by phone at [phone] option 4.     Si Usted Necesita Algo Despus de Su Visita  Tambin puede enviarnos un mensaje a travs de MyChart. Por lo general respondemos a los mensajes de MyChart en el transcurso de 1 a 2  das hbiles.  Para renovar recetas, por favor pida a su farmacia que se ponga en contacto con nuestra oficina. Nuestro nmero de fax es el [phone].  Si tiene un asunto urgente cuando la clnica est cerrada y que no puede esperar hasta el siguiente da hbil, puede llamar/localizar a su doctor(a) al nmero que aparece a continuacin.   Por favor, tenga en cuenta que aunque hacemos todo lo posible para estar disponibles para asuntos urgentes fuera del horario de oficina, no estamos disponibles las 24 horas del da, los 7 das de la semana.   Si tiene un problema urgente y no puede comunicarse con nosotros, puede optar por buscar atencin mdica  en el consultorio de su doctor(a), en una clnica privada, en un centro de atencin urgente o en una sala de emergencias.  Si tiene una emergencia mdica, por favor llame inmediatamente al 911 o vaya a la sala de emergencias.  Nmeros de bper  - Dr. Kowalski: [phone]  - Dra. Moye: [phone]  - Dra. Stewart: [phone]  En caso de inclemencias del tiempo, por favor llame a nuestra lnea principal al [phone] para una actualizacin sobre el estado de cualquier retraso o cierre.  Consejos para la medicacin en dermatologa: Por favor, guarde las cajas en las que vienen los medicamentos de uso tpico para ayudarle a seguir las instrucciones sobre dnde y cmo usarlos. Las farmacias generalmente imprimen las instrucciones del medicamento slo en las cajas y no directamente en los tubos del medicamento.   Si su medicamento es muy caro, por favor, pngase en contacto con nuestra oficina llamando al [phone] y presione la opcin 4 o envenos un mensaje a travs de MyChart.   No podemos decirle cul ser su copago por los medicamentos por adelantado ya que esto es diferente dependiendo de la cobertura de su seguro. Sin embargo, es posible que podamos encontrar un medicamento sustituto a menor costo o llenar un formulario para que el  seguro cubra el medicamento que se considera necesario.   Si se requiere una autorizacin previa para que su compaa de seguros cubra su medicamento, por favor permtanos de 1 a 2 das hbiles para completar este proceso.  Los precios de los medicamentos varan con frecuencia dependiendo del lugar de dnde se surte la receta y alguna farmacias pueden ofrecer precios ms baratos.  El sitio web www.goodrx.com tiene cupones para medicamentos de diferentes farmacias. Los precios aqu no tienen en cuenta lo que podra costar con la ayuda del seguro (puede ser ms barato con su seguro), pero el sitio web puede darle el precio si no utiliz ningn seguro.  - Puede imprimir el cupn correspondiente y llevarlo con su receta a la farmacia.  - Tambin puede pasar por nuestra oficina durante el horario de atencin regular y recoger una tarjeta de cupones de GoodRx.  - Si necesita que su receta se enve electrnicamente a una farmacia diferente, informe a nuestra oficina a travs de MyChart de [place]   o por telfono llamando al [phone] y presione la opcin 4.  

## 2023-04-11 NOTE — Progress Notes (Signed)
   Follow-Up Visit   Subjective  Dakota Harper is a 77 y.o. male who presents for the following: Suture removal - of the L upper back, severely dysplastic nevus, S/P excision.   Pathology showed a margins free severely dysplastic nevus.  The following portions of the chart were reviewed this encounter and updated as appropriate: medications, allergies, medical history  Review of Systems:  No other skin or systemic complaints except as noted in HPI or Assessment and Plan.  Objective  Well appearing patient in no apparent distress; mood and affect are within normal limits.  Areas Examined: The back  Relevant physical exam findings are noted in the Assessment and Plan.    Assessment & Plan    Encounter for Removal of Sutures - Incision site is clean, dry and intact. - Wound cleansed, sutures removed, wound cleansed and steri strips applied.  - Discussed pathology results showing a margins free severely dysplastic nevus. - Patient advised to keep steri-strips dry until they fall off. - Scars remodel for a full year. - Once steri-strips fall off, patient can apply over-the-counter silicone scar cream once to twice a day to help with scar remodeling if desired. - Patient advised to call with any concerns or if they notice any new or changing lesions.  Return for appointment as scheduled.  Maylene Roes, CMA, am acting as scribe for Willeen Niece, MD .  Documentation: I have reviewed the above documentation for accuracy and completeness, and I agree with the above.  Willeen Niece, MD

## 2023-05-03 ENCOUNTER — Ambulatory Visit (INDEPENDENT_AMBULATORY_CARE_PROVIDER_SITE_OTHER): Payer: 59 | Admitting: Podiatry

## 2023-05-03 ENCOUNTER — Encounter: Payer: Self-pay | Admitting: Podiatry

## 2023-05-03 VITALS — BP 122/72 | HR 79

## 2023-05-03 DIAGNOSIS — M79675 Pain in left toe(s): Secondary | ICD-10-CM

## 2023-05-03 DIAGNOSIS — B351 Tinea unguium: Secondary | ICD-10-CM

## 2023-05-03 DIAGNOSIS — M79674 Pain in right toe(s): Secondary | ICD-10-CM

## 2023-05-03 NOTE — Progress Notes (Signed)
  Subjective:  Patient ID: Dakota Harper, male    DOB: 02-13-1946,  MRN: [id number]  Dakota Harper presents to clinic today for: painful, discolored, thick toenails which interfere with daily activities  Chief Complaint  Patient presents with   Nail Problem    "I need my ingrown toenail to be chemically treated."  (I explained to the patient that he may have to see Dr. Logan Bores) N - ingrown toenail L - hallux left D - years O - gradually worse C - sharp pain A - if I don't cut it often enough, shoes T - get regular nail trims     PCP is Smiley Houseman, NP.  Allergies  Allergen Reactions   Saw Palmetto Hives and Rash    "Berry form"   Other Other (See Comments) and Rash   Sulfa Antibiotics Rash   Sulfacetamide Sodium Rash    Review of Systems: Negative except as noted in the HPI.  Objective: No changes noted in today's physical examination. Vitals:   05/03/23 1416  BP: 122/72  Pulse: 67    Dakota Harper is a pleasant 77 y.o. male in NAD. AAO x 3.  Vascular Examination: Capillary refill time <3 seconds b/l LE. Palpable pedal pulses b/l LE. Digital hair present b/l. No pedal edema b/l. Skin temperature gradient WNL b/l. No varicosities b/l. Marland Kitchen  Dermatological Examination: Pedal skin with normal turgor, texture and tone b/l. No open wounds. No interdigital macerations b/l. Toenails 1-5 b/l thickened, discolored, dystrophic with subungual debris. There is pain on palpation to dorsal aspect of nailplates. .  Neurological Examination: Protective sensation intact with 10 gram monofilament b/l LE. Vibratory sensation intact b/l LE.   Musculoskeletal Examination: Normal muscle strength 5/5 to all lower extremity muscle groups bilaterally. No pain, crepitus or joint limitation noted with ROM b/l LE. No gross bony pedal deformities b/l. Patient ambulates independently without assistive aids.  Assessment/Plan: 1. Pain due to onychomycosis of toenails of both feet      -Consent given for treatment as described below: -Examined patient. -Discussed nail avulsion procedure. Patient will schedule with Dr. Logan Bores at his convenience. -Toenails were debrided in length and girth 2-5 bilaterally and right great toe with sterile nail nippers and dremel without iatrogenic bleeding.  -No invasive procedure(s) performed. Offending nail border debrided and curretaged left great toe utilizing sterile nail nipper and currette. Border cleansed with alcohol and triple antibiotic ointment. No further treatment required by patient/caregiver. Call office if there are any concerns. -Patient/POA to call should there be question/concern in the interim.   Return in about 3 months (around 08/03/2023).  Freddie Breech, DPM

## 2023-05-23 ENCOUNTER — Other Ambulatory Visit: Payer: Self-pay | Admitting: Dermatology

## 2023-05-23 DIAGNOSIS — B880 Other acariasis: Secondary | ICD-10-CM

## 2023-06-07 ENCOUNTER — Encounter: Payer: 59 | Admitting: Dermatology

## 2023-06-14 ENCOUNTER — Emergency Department: Payer: 59

## 2023-06-14 ENCOUNTER — Emergency Department
Admission: EM | Admit: 2023-06-14 | Discharge: 2023-06-15 | Disposition: A | Discharge status: Home / Self Care | Payer: 59 | Attending: Emergency Medicine | Admitting: Emergency Medicine

## 2023-06-14 ENCOUNTER — Other Ambulatory Visit: Payer: Self-pay

## 2023-06-14 DIAGNOSIS — I10 Essential (primary) hypertension: Secondary | ICD-10-CM | POA: Diagnosis not present

## 2023-06-14 DIAGNOSIS — S0101XA Laceration without foreign body of scalp, initial encounter: Secondary | ICD-10-CM | POA: Diagnosis not present

## 2023-06-14 DIAGNOSIS — W19XXXA Unspecified fall, initial encounter: Secondary | ICD-10-CM | POA: Insufficient documentation

## 2023-06-14 DIAGNOSIS — J449 Chronic obstructive pulmonary disease, unspecified: Secondary | ICD-10-CM | POA: Insufficient documentation

## 2023-06-14 DIAGNOSIS — S0990XA Unspecified injury of head, initial encounter: Secondary | ICD-10-CM | POA: Diagnosis present

## 2023-06-14 LAB — COMPREHENSIVE METABOLIC PANEL
ALT: 15 U/L (ref 0–44)
AST: 20 U/L (ref 15–41)
Albumin: 3.5 g/dL (ref 3.5–5.0)
Alkaline Phosphatase: 46 U/L (ref 38–126)
Anion gap: 9 (ref 5–15)
BUN: 23 mg/dL (ref 8–23)
CO2: 28 mmol/L (ref 22–32)
Calcium: 9 mg/dL (ref 8.9–10.3)
Chloride: 101 mmol/L (ref 98–111)
Creatinine, Ser: 1.29 mg/dL — ABNORMAL HIGH (ref 0.61–1.24)
GFR, Estimated: 57 mL/min — ABNORMAL LOW (ref >60)
Glucose, Bld: 92 mg/dL (ref 70–99)
Potassium: 3.8 mmol/L (ref 3.5–5.1)
Sodium: 138 mmol/L (ref 135–145)
Total Bilirubin: 0.7 mg/dL (ref 0.3–1.2)
Total Protein: 6.6 g/dL (ref 6.5–8.1)

## 2023-06-14 LAB — CBC
HCT: 41.7 % (ref 39.0–52.0)
Hemoglobin: 13.8 g/dL (ref 13.0–17.0)
MCH: 30.7 pg (ref 26.0–34.0)
MCHC: 33.1 g/dL (ref 30.0–36.0)
MCV: 92.9 fL (ref 80.0–100.0)
Platelets: 220 10*3/uL (ref 150–400)
RBC: 4.49 MIL/uL (ref 4.22–5.81)
RDW: 13.7 % (ref 11.5–15.5)
WBC: 7.7 10*3/uL (ref 4.0–10.5)
nRBC: 0 % (ref 0.0–0.2)

## 2023-06-14 LAB — TROPONIN I (HIGH SENSITIVITY): Troponin I (High Sensitivity): 8 ng/L (ref <18)

## 2023-06-14 LAB — CBG MONITORING, ED: Glucose-Capillary: 83 mg/dL (ref 70–99)

## 2023-06-14 MED ORDER — ACETAMINOPHEN 325 MG PO TABS
650.0000 mg | ORAL_TABLET | Freq: Once | ORAL | Status: AC
  Administered 2023-06-14: 650 mg via ORAL
  Filled 2023-06-14: qty 2

## 2023-06-14 MED ORDER — CEPHALEXIN 500 MG PO CAPS: 500.0000 mg | ORAL_CAPSULE | Freq: Two times a day (BID) | ORAL | 0 refills | Status: AC

## 2023-06-14 MED ORDER — LIDOCAINE-EPINEPHRINE (PF) 2 %-1:200000 IJ SOLN
10.0000 mL | Freq: Once | INTRAMUSCULAR | Status: AC
  Administered 2023-06-14: 10 mL via INTRADERMAL
  Filled 2023-06-14: qty 20

## 2023-06-14 NOTE — ED Triage Notes (Signed)
RN cleansed blood off of pt head, neck, and torso. Pt provided permission to throw away blood soaked shirt. Clean gown applied to patient.

## 2023-06-14 NOTE — ED Provider Notes (Signed)
[facility] Emergency Department Provider Note     Event Date/Time   First MD Initiated Contact with Patient 06/14/23 2201     (approximate)   History   No chief complaint on file.   HPI  Dakota Harper is a 77 y.o. male with a past medical history of hypertension, neuropathy, COPD, and arthritis presents to the ED via EMS for a fall at home. Patient sustained a laceration to the back of his head.  Patient reports he was walking and believes he tripped and fell.  Patient thinks he hit his head on the TV. Patient takes aspirin daily.  He denies LOC and vomiting.  Patient ambulates with assistance of a walker.  Reports he did not have his walker when the incident occurred. Denies chest pain, back pain, pain or injury to extremities.     Physical Exam   Triage Vital Signs: ED Triage Vitals  Encounter Vitals Group     BP 06/14/23 2057 107/66     Systolic BP Percentile --      Diastolic BP Percentile --      Pulse Rate 06/14/23 2057 77     Resp 06/14/23 2057 16     Temp 06/14/23 2057 98.7 F (37.1 C)     Temp src --      SpO2 06/14/23 2057 100 %     Weight 06/14/23 2050 206 lb (93.4 kg)     Height 06/14/23 2050 5\' 8"  (1.727 m)     Head Circumference --      Peak Flow --      Pain Score 06/14/23 2050 4     Pain Loc --      Pain Education --      Exclude from Growth Chart --     Most recent vital signs: Vitals:   06/14/23 2057 06/15/23 0005  BP: 107/66 110/79  Pulse: 77 75  Resp: 16 18  Temp: 98.7 F (37.1 C) 98.5 F (36.9 C)  SpO2: 100% 100%    General Awake, no distress.  Speaking in complete sentences. Answering questions appropriately. HEENT NCAT. PERRL. EOMI.  CV:  Good peripheral perfusion.  RRR RESP:  Normal effort.  LCTAB   BACK:  Spinous process is midline without deformity or tenderness.   NEURO: Cranial nerves intact. No focal deficits. Sensation and motor function intact. 5/5 UE and LE muscle strength.  Other:   2 cm  single linear laceration on posterior scalp. Bleeding controlled.  ED Results / Procedures / Treatments   Labs (all labs ordered are listed, but only abnormal results are displayed) Labs Reviewed  COMPREHENSIVE METABOLIC PANEL - Abnormal; Notable for the following components:      Result Value   Creatinine, Ser 1.29 (*)    GFR, Estimated 57 (*)    All other components within normal limits  CBC  CBG MONITORING, ED  TROPONIN I (HIGH SENSITIVITY)    EKG  NSR  RADIOLOGY  I personally viewed and evaluated these images as part of my medical decision making, as well as reviewing the written report by the radiologist.  ED Provider Interpretation: CT head and cervical are unremarkable. No intracranial pathologies or fracture  CT HEAD WO CONTRAST  Result Date: 06/14/2023 CLINICAL DATA:  fall EXAM: CT HEAD WITHOUT CONTRAST CT CERVICAL SPINE WITHOUT CONTRAST TECHNIQUE: Multidetector CT imaging of the head and cervical spine was performed following the standard protocol without intravenous contrast. Multiplanar CT image reconstructions of the cervical  spine were also generated. RADIATION DOSE REDUCTION: This exam was performed according to the departmental dose-optimization program which includes automated exposure control, adjustment of the mA and/or kV according to patient size and/or use of iterative reconstruction technique. COMPARISON:  CT head 07/08/2022 FINDINGS: CT HEAD FINDINGS Brain: No evidence of large-territorial acute infarction. No parenchymal hemorrhage. No mass lesion. No extra-axial collection. No mass effect or midline shift. No hydrocephalus. Basilar cisterns are patent. Vascular: No hyperdense vessel. Atherosclerotic calcifications are present within the cavernous internal carotid and vertebral arteries. Skull: No acute fracture or focal lesion. Sinuses/Orbits: Paranasal sinuses and mastoid air cells are clear. Bilateral lens replacement. Otherwise the orbits are unremarkable.  Other: None. CT CERVICAL SPINE FINDINGS Alignment: Reversal of normal cervical lordosis centered at the C4 level. Grade 1 anterolisthesis of C3 on C4. Skull base and vertebrae: Multilevel moderate degenerative changes of the spine most prominent at the C4 through C6 levels. No associated severe osseous neural foraminal or central canal stenosis. No acute fracture. No aggressive appearing focal osseous lesion or focal pathologic process. Bilateral temporomandibular joint degenerative changes. Soft tissues and spinal canal: No prevertebral fluid or swelling. No visible canal hematoma. Upper chest: Unremarkable. Other: Atherosclerotic plaque of the aorta and the carotid arteries within the neck. IMPRESSION: 1. No acute intracranial abnormality. 2. No acute displaced fracture or traumatic listhesis of the cervical spine. Electronically Signed   By: Tish Frederickson M.D.   On: 06/14/2023 21:27   CT Cervical Spine Wo Contrast  Result Date: 06/14/2023 CLINICAL DATA:  fall EXAM: CT HEAD WITHOUT CONTRAST CT CERVICAL SPINE WITHOUT CONTRAST TECHNIQUE: Multidetector CT imaging of the head and cervical spine was performed following the standard protocol without intravenous contrast. Multiplanar CT image reconstructions of the cervical spine were also generated. RADIATION DOSE REDUCTION: This exam was performed according to the departmental dose-optimization program which includes automated exposure control, adjustment of the mA and/or kV according to patient size and/or use of iterative reconstruction technique. COMPARISON:  CT head 07/08/2022 FINDINGS: CT HEAD FINDINGS Brain: No evidence of large-territorial acute infarction. No parenchymal hemorrhage. No mass lesion. No extra-axial collection. No mass effect or midline shift. No hydrocephalus. Basilar cisterns are patent. Vascular: No hyperdense vessel. Atherosclerotic calcifications are present within the cavernous internal carotid and vertebral arteries. Skull: No acute  fracture or focal lesion. Sinuses/Orbits: Paranasal sinuses and mastoid air cells are clear. Bilateral lens replacement. Otherwise the orbits are unremarkable. Other: None. CT CERVICAL SPINE FINDINGS Alignment: Reversal of normal cervical lordosis centered at the C4 level. Grade 1 anterolisthesis of C3 on C4. Skull base and vertebrae: Multilevel moderate degenerative changes of the spine most prominent at the C4 through C6 levels. No associated severe osseous neural foraminal or central canal stenosis. No acute fracture. No aggressive appearing focal osseous lesion or focal pathologic process. Bilateral temporomandibular joint degenerative changes. Soft tissues and spinal canal: No prevertebral fluid or swelling. No visible canal hematoma. Upper chest: Unremarkable. Other: Atherosclerotic plaque of the aorta and the carotid arteries within the neck. IMPRESSION: 1. No acute intracranial abnormality. 2. No acute displaced fracture or traumatic listhesis of the cervical spine. Electronically Signed   By: Tish Frederickson M.D.   On: 06/14/2023 21:27    PROCEDURES:  Critical Care performed: No  ..Laceration Repair  Date/Time: 06/14/2023 11:47 PM  Performed by: Conrad Herricks, PA-C Authorized by: Kern Reap A, PA-C   Laceration details:    Location:  Scalp   Length (cm):  2 Treatment:    Area  cleansed with:  Povidone-iodine   Amount of cleaning:  Standard   Irrigation solution:  Sterile saline   Irrigation method:  Syringe Skin repair:    Repair method:  Staples   Number of staples:  4 Approximation:    Approximation:  Close Repair type:    Repair type:  Simple Post-procedure details:    Dressing:  Open (no dressing)   Procedure completion:  Tolerated   MEDICATIONS ORDERED IN ED: Medications  lidocaine-EPINEPHrine (XYLOCAINE W/EPI) 2 %-1:200000 (PF) injection 10 mL (10 mLs Intradermal Given by Other 06/14/23 2259)  acetaminophen (TYLENOL) tablet 650 mg (650 mg Oral Given 06/14/23 2357)     IMPRESSION / MDM / ASSESSMENT AND PLAN / ED COURSE  I reviewed the triage vital signs and the nursing notes.                               77 y.o. male presents to the emergency department via EMS for evaluation and treatment of fall at home, resulting a 2 cm scalp laceration to posterior scalp. See HPI for further details.   Differential diagnosis includes, but is not limited to intracranial hemorrhage, skull fracture, Acute MI, cervical spine injury, laceration.    Patient's presentation is most consistent with acute presentation with potential threat to life or bodily function.  On primary assessment, patient is alert and oriented. He is able to speak in complete sentences and answering questions appropriately. CT head and cervical spine ordered in triage, are reassuring. EKG reveals normal sinus rhythm. CBC, CMP, CBG and troponin are reassuring. No acute abnormalities ruling out any acute cardiac event.   Please see procedure note for scalp laceration repair. The patient tolerated well.   Patient remained stable during his time in ED with VSS. He does not require admission and is discharged home with recommendation to follow up for staple removal in 7 days and at home wound care instructions. Patient is given ED precautions to return to the ED for any worsening or new symptoms. Patient verbalizes understanding. All questions and concerns were addressed during ED visit.     FINAL CLINICAL IMPRESSION(S) / ED DIAGNOSES   Final diagnoses:  Scalp laceration, initial encounter  Fall, initial encounter   Rx / DC Orders   ED Discharge Orders          Ordered    cephALEXin (KEFLEX) 500 MG capsule  2 times daily        06/14/23 2347            Note:  This document was prepared using Dragon voice recognition software and may include unintentional dictation errors.    Romeo Apple, Jouri Threat A, PA-C 06/15/23 1804    Minna Antis, MD 06/17/23 1315

## 2023-06-14 NOTE — Discharge Instructions (Addendum)
Return to ED or follow-up with your primary care to remove staples (4) in 7 days.

## 2023-06-14 NOTE — ED Notes (Signed)
Patient transported to CT 

## 2023-06-14 NOTE — ED Triage Notes (Signed)
  BIB [facility] from home. Pt reports he felt like his legs were going out from under him and he fell. Pt hit his head on the TV on the way down. No daily thinners other that ASA. Pt alert and oriented on arrival. Denies LOC, dizziness, blurred vision. Reports mild h/a. Denies chest pain or pressure. Denies SOB. EMS reports 1 inch laceration to back of head. Pt has no active bleeding and has clean bandage in place. Pt does have large amount of blood on self from prior to EMS arrival.   EMS VS:   128/80 HR 70 RR 18 Spo2 99% RA

## 2023-07-03 ENCOUNTER — Encounter: Payer: Self-pay | Admitting: Podiatry

## 2023-07-03 ENCOUNTER — Ambulatory Visit (INDEPENDENT_AMBULATORY_CARE_PROVIDER_SITE_OTHER): Payer: 59 | Admitting: Podiatry

## 2023-07-03 DIAGNOSIS — L600 Ingrowing nail: Secondary | ICD-10-CM

## 2023-07-03 NOTE — Progress Notes (Signed)
Chief Complaint  Patient presents with   Nail Problem    "I an ingrown toenail that I want cut out and treated chemically."   Foot Pain    "I have edema in my feet."    Subjective: Patient presents today for evaluation of pain to the medial border left great toe. Patient is concerned for possible ingrown nail.  It is very sensitive to touch.  Patient presents today for further treatment and evaluation.  Past Medical History:  Diagnosis Date   Anxiety    Arthritis    Asthma    Basal cell carcinoma 07/04/2021   L nasal ala. 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 excision 04/04/23   Ear pain    GERD (gastroesophageal reflux disease)    Gout    Headache    History of kidney stones    Hypercholesteremia    Hypertension    Hypothyroidism    Neuropathy    Pneumonia    Sleep disorder    Wears hearing aid in both ears     Past Surgical History:  Procedure Laterality Date   ADJACENT TISSUE TRANSFER/TISSUE REARRANGEMENT Left 12/19/2021   Procedure: ADJACENT TISSUE TRANSFER/TISSUE REARRANGEMENT;  Surgeon: Allena Napoleon, MD;  [facility]: [facility];  Service: Plastics;  Laterality: Left;   CATARACT EXTRACTION W/PHACO Right 09/11/2022   Procedure: CATARACT EXTRACTION PHACO AND INTRAOCULAR LENS PLACEMENT (IOC) RIGHT  3.53  00:38.4;  Surgeon: Nevada Crane, MD;  Location: [facility];  Service: Ophthalmology;  Laterality: Right;   CATARACT EXTRACTION W/PHACO Left 09/25/2022   Procedure: CATARACT EXTRACTION PHACO AND INTRAOCULAR LENS PLACEMENT (IOC) LEFT;  Surgeon: Nevada Crane, MD;  Location: [facility];  Service: Ophthalmology;  Laterality: Left;  2.80 0:27.8   CHOLECYSTECTOMY     COLONOSCOPY     COLONOSCOPY  WITH PROPOFOL N/A 08/01/2021   Procedure: COLONOSCOPY WITH PROPOFOL;  Surgeon: Wyline Mood, MD;  Location: [facility] ENDOSCOPY;  Service: Gastroenterology;  Laterality: N/A;   ESOPHAGOGASTRODUODENOSCOPY (EGD) WITH PROPOFOL N/A 11/30/2021   Procedure: ESOPHAGOGASTRODUODENOSCOPY (EGD) WITH PROPOFOL;  Surgeon: Wyline Mood, MD;  Location: [facility] ENDOSCOPY;  Service: Gastroenterology;  Laterality: N/A;   HERNIA REPAIR     HOLEP-LASER ENUCLEATION OF THE PROSTATE WITH MORCELLATION N/A 09/04/2022   Procedure: HOLEP-LASER ENUCLEATION OF THE PROSTATE WITH MORCELLATION;  Surgeon: Vanna Scotland, MD;  Location: [facility] ORS;  Service: Urology;  Laterality: N/A;   NASAL RECONSTRUCTION Left 12/19/2021   Procedure: NASAL RECONSTRUCTION;  Surgeon: Allena Napoleon, MD;  Location: [facility];  Service: Plastics;  Laterality: Left;  2 hours   pituitary tumor excision      Allergies  Allergen Reactions   Saw Palmetto Hives and Rash    "Berry form"   Other Other (See Comments) and Rash   Sulfa Antibiotics Rash   Sulfacetamide Sodium Rash    Objective:  General: Well developed, nourished, in no acute distress, alert and oriented x3   Dermatology: Skin is warm, dry and supple bilateral.  Medial border left great toe is tender with evidence of an ingrowing nail. Pain on palpation noted to the border of the nail fold. The remaining nails appear unremarkable at this time.  Vascular: DP and PT pulses palpable.  No clinical evidence of vascular compromise  Neruologic: Grossly intact via light touch bilateral.  Musculoskeletal: No pedal deformity noted  Assesement: #1 Paronychia with ingrowing nail medial border left great toe  Plan of Care:  -Patient evaluated.  -Discussed treatment alternatives and plan of care. Explained nail avulsion procedure and post procedure course to patient. -Patient opted for permanent partial nail avulsion of the ingrown portion of the nail.  -Prior to procedure, local anesthesia infiltration  utilized using 3 ml of a 50:50 mixture of 2% plain lidocaine and 0.5% plain marcaine in a normal hallux block fashion and a betadine prep performed.  -Partial permanent nail avulsion with chemical matrixectomy performed using 3x30sec applications of phenol followed by alcohol flush.  -Light dressing applied.  Post care instructions provided -Return to clinic 3 weeks  Felecia Shelling, DPM [facility]  Dr. Felecia Shelling, DPM    [address] [place], [place] [address]                Office [phone]  Fax [phone]

## 2023-07-10 ENCOUNTER — Encounter: Payer: 59 | Admitting: Dermatology

## 2023-07-11 NOTE — Progress Notes (Unsigned)
07/12/2023 4:28 PM   Dakota Harper [birth year] [id number]  Referring provider: [facility] [address] [place],  [place] [address]  Urological history: 1. BPH with LU TS -HoLEP (08/2022) - negative for malignancy  Chief Complaint  Patient presents with   Over Active Bladder   HPI: Dakota Harper is a 77 y.o. male who presents today for OAB.  Previous records reviewed.   He has been having daytime frequency and nocturia.  He was on Myrbetriq in the past, but it lost its effectiveness.  He hasn't been on the Myrbetriq for months.  Patient denies any modifying or aggravating factors.  Patient denies any recent UTI's, gross hematuria, dysuria or suprapubic/flank pain.  Patient denies any fevers, chills, nausea or vomiting.    He is also interested in PTNS and would like the brochure.  HE also was wondering about a PSA and DRE.   I PSS 18/6  PVR 25 mL  UA yellow clear, trace ketone, greater than 1.030 specific gravity, pH 6.0, 2+ protein, 2 urobilinogen, 0-5 WBCs, 0-2 RBCs, 0-10 epithelial cells, hyaline cast present, mucus threads present and moderate bacteria.     IPSS     Row Name 07/12/23 1500         International Prostate Symptom Score   How often have you had the sensation of not emptying your bladder? Less than 1 in 5     How often have you had to urinate less than every two hours? More than half the time     How often have you found you stopped and started again several times when you urinated? Less than 1 in 5 times     How often have you found it difficult to postpone urination? Almost always     How often have you had a weak urinary stream? Not at All     How often have you had to strain to start urination? About half the time     How many times did you typically get up at night to urinate? 4 Times     Total IPSS Score 18       Quality of Life due to urinary symptoms   If you were to spend the rest of your life with your urinary  condition just the way it is now how would you feel about that? Terrible              Score:  1-7 Mild 8-19 Moderate 20-35 Severe   PMH: Past Medical History:  Diagnosis Date   Anxiety    Arthritis    Asthma    Basal cell carcinoma 07/04/2021   L nasal ala. 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 excision 04/04/23   Ear pain    GERD (gastroesophageal reflux disease)    Gout    Headache    History of kidney stones    Hypercholesteremia    Hypertension    Hypothyroidism    Neuropathy    Pneumonia    Sleep disorder    Wears hearing aid in both ears     Surgical History: Past Surgical History:  Procedure Laterality Date   ADJACENT TISSUE  TRANSFER/TISSUE REARRANGEMENT Left 12/19/2021   Procedure: ADJACENT TISSUE TRANSFER/TISSUE REARRANGEMENT;  Surgeon: Allena Napoleon, MD;  Location: [facility];  Service: Plastics;  Laterality: Left;   CATARACT EXTRACTION W/PHACO Right 09/11/2022   Procedure: CATARACT EXTRACTION PHACO AND INTRAOCULAR LENS PLACEMENT (IOC) RIGHT  3.53  00:38.4;  Surgeon: Nevada Crane, MD;  Location: [facility];  Service: Ophthalmology;  Laterality: Right;   CATARACT EXTRACTION W/PHACO Left 09/25/2022   Procedure: CATARACT EXTRACTION PHACO AND INTRAOCULAR LENS PLACEMENT (IOC) LEFT;  Surgeon: Nevada Crane, MD;  Location: [facility];  Service: Ophthalmology;  Laterality: Left;  2.80 0:27.8   CHOLECYSTECTOMY     COLONOSCOPY     COLONOSCOPY WITH PROPOFOL N/A 08/01/2021   Procedure: COLONOSCOPY WITH PROPOFOL;  Surgeon: Wyline Mood, MD;  Location: [facility] ENDOSCOPY;  Service: Gastroenterology;  Laterality: N/A;   ESOPHAGOGASTRODUODENOSCOPY (EGD) WITH PROPOFOL N/A 11/30/2021   Procedure:  ESOPHAGOGASTRODUODENOSCOPY (EGD) WITH PROPOFOL;  Surgeon: Wyline Mood, MD;  Location: [facility] ENDOSCOPY;  Service: Gastroenterology;  Laterality: N/A;   HERNIA REPAIR     HOLEP-LASER ENUCLEATION OF THE PROSTATE WITH MORCELLATION N/A 09/04/2022   Procedure: HOLEP-LASER ENUCLEATION OF THE PROSTATE WITH MORCELLATION;  Surgeon: Vanna Scotland, MD;  Location: [facility] ORS;  Service: Urology;  Laterality: N/A;   NASAL RECONSTRUCTION Left 12/19/2021   Procedure: NASAL RECONSTRUCTION;  Surgeon: Allena Napoleon, MD;  Location: [facility];  Service: Plastics;  Laterality: Left;  2 hours   pituitary tumor excision      Home Medications:  Allergies as of 07/12/2023       Reactions   Saw Palmetto Hives, Rash   "Berry form"   Other Other (See Comments), Rash   Sulfa Antibiotics Rash   Sulfacetamide Sodium Rash        Medication List        Accurate as of July 12, 2023  4:28 PM. If you have any questions, ask your nurse or doctor.          STOP taking these medications    Myrbetriq 50 MG Tb24 tablet Generic drug: mirabegron ER Stopped by: Carollee Herter Ondria Oswald       TAKE these medications    acetaminophen 500 MG tablet Commonly known as: TYLENOL Take 500 mg by mouth every 4 (four) hours as needed for moderate pain or mild pain.   albuterol 108 (90 Base) MCG/ACT inhaler Commonly known as: VENTOLIN HFA Inhale 1 puff into the lungs 3 (three) times daily as needed for wheezing or shortness of breath.   alum & mag hydroxide-simeth 200-200-20 MG/5ML suspension Commonly known as: MAALOX/MYLANTA Take 30 mLs by mouth 4 (four) times daily as needed for indigestion or heartburn.   amLODipine 5 MG tablet Commonly known as: NORVASC Take 1 tablet (5 mg total) by mouth daily.   aspirin EC 81 MG tablet Take 81 mg by mouth at bedtime. (2100)   atorvastatin 10 MG tablet Commonly known as: LIPITOR Take 10 mg by mouth every evening. (2000)   Breo Ellipta 200-25 MCG/INH Aepb Generic drug: fluticasone  furoate-vilanterol Inhale 1 puff into the lungs daily. (0800)   chlorhexidine 0.12 % solution Commonly known as: PERIDEX Use as directed 15 mLs in the mouth or throat 2 (two) times daily as needed (oral discomfort).   cycloSPORINE 0.05 % ophthalmic emulsion Commonly known as: RESTASIS Place 1 drop into both eyes 2 (two) times daily as needed (dry eyes).   diclofenac Sodium 1 % Gel Commonly known as: VOLTAREN Apply 2 g topically as needed (  lower left leg pain.).   diphenhydrAMINE 25 MG tablet Commonly known as: BENADRYL Take 25 mg by mouth every 6 (six) hours as needed for allergies.   divalproex 250 MG DR tablet Commonly known as: DEPAKOTE Take 250 mg by mouth 2 (two) times daily.   Ear Wax Removal Drops 6.5 % OTIC solution Generic drug: carbamide peroxide Place 5 drops into both ears every Sunday.   eucerin cream Apply 1 application  topically in the morning and at bedtime. (0800 & 2000)   fenofibrate 54 MG tablet Take 1 tablet (54 mg total) by mouth daily.   finasteride 5 MG tablet Commonly known as: PROSCAR Take 1 tablet (5 mg total) by mouth daily.   fludrocortisone 0.1 MG tablet Commonly known as: FLORINEF Take 100 mcg by mouth daily.   Fluocinolone Acetonide Body 0.01 % Oil Apply to scalp 1-2 times a day and leave on.   fluocinonide ointment 0.05 % Commonly known as: LIDEX Apply 1 application  topically 2 (two) times daily as needed (pruritus). (apply to legs)   gabapentin 400 MG capsule Commonly known as: NEURONTIN Take 400 mg by mouth at bedtime. Take along with one 100 mg capsule for total dose 500 mg daily at bedtime   gabapentin 100 MG capsule Commonly known as: NEURONTIN Take 100 mg by mouth at bedtime. Take along with one 400 mg capsule for total dose 500 mg daily at bedtime   Gemtesa 75 MG Tabs Generic drug: Vibegron Take 1 tablet (75 mg total) by mouth daily. Started by: Michiel Cowboy   guaiFENesin 100 MG/5ML liquid Commonly known as:  ROBITUSSIN Take 300 mg by mouth every 6 (six) hours as needed for cough.   HYDROcodone-acetaminophen 5-325 MG tablet Commonly known as: NORCO/VICODIN Take 1-2 tablets by mouth every 6 (six) hours as needed for moderate pain.   ketoconazole 2 % cream Commonly known as: NIZORAL Apply 1 Application topically daily.   ketoconazole 2 % shampoo Commonly known as: NIZORAL Massage into scalp daily and let sit several minutes before rinsing.   lactulose 10 GM/15ML solution Commonly known as: CHRONULAC Take 30 mLs (20 g total) by mouth daily as needed for mild constipation.   levothyroxine 50 MCG tablet Commonly known as: SYNTHROID Take 50 mcg by mouth daily before breakfast. (0630)   loperamide 2 MG capsule Commonly known as: IMODIUM Take 4 mg by mouth as needed for diarrhea or loose stools.   magnesium hydroxide 400 MG/5ML suspension Commonly known as: MILK OF MAGNESIA Take 30 mLs by mouth daily as needed (constipation (no bowel movement for 3 days)).   montelukast 5 MG chewable tablet Commonly known as: SINGULAIR Chew 5 mg by mouth at bedtime. (2100)   nortriptyline 75 MG capsule Commonly known as: PAMELOR Take 150 mg by mouth daily. (0900)   omeprazole 40 MG capsule Commonly known as: PRILOSEC Take 1 capsule (40 mg total) by mouth daily.   ondansetron 4 MG disintegrating tablet Commonly known as: ZOFRAN-ODT Take 1 tablet (4 mg total) by mouth every 8 (eight) hours as needed for nausea or vomiting.   paliperidone 3 MG 24 hr tablet Commonly known as: INVEGA Take 3 mg by mouth at bedtime.   permethrin 5 % cream Commonly known as: ELIMITE APPLY 1 APPLICATIONS TOPICALLY EVERY MORNING AND EVERY NIGHT AT BEDTIME TO FACE AND SCALP   polyethylene glycol 17 g packet Commonly known as: MIRALAX / GLYCOLAX Take 17 g by mouth daily as needed (constipation.).   ramelteon 8 MG tablet Commonly  known as: ROZEREM Take 8 mg by mouth at bedtime.   tadalafil 20 MG tablet Commonly  known as: CIALIS Take one tablet at least 60 minutes prior to sexual activity.   tamsulosin 0.4 MG Caps capsule Commonly known as: FLOMAX Take 0.4 mg by mouth at bedtime. (2100)   tiZANidine 2 MG tablet Commonly known as: ZANAFLEX Take 2 mg by mouth at bedtime. (2000)   valproic acid 250 MG capsule Commonly known as: DEPAKENE Take 250 mg by mouth 2 (two) times daily.        Allergies:  Allergies  Allergen Reactions   Saw Palmetto Hives and Rash    "Berry form"   Other Other (See Comments) and Rash   Sulfa Antibiotics Rash   Sulfacetamide Sodium Rash    Family History: Family History  Problem Relation Age of Onset   Kidney cancer Mother    Heart attack Father    Alzheimer's disease Father    Heart attack Sister    Diabetes Mellitus II Sister    Benign prostatic hyperplasia Brother    Prostate cancer Neg Hx    Bladder Cancer Neg Hx     Social History:  reports that he has quit smoking. His smoking use included cigarettes. He has never used smokeless tobacco. He reports that he does not drink alcohol and does not use drugs.  ROS: Pertinent ROS in HPI  Physical Exam: BP 107/72   Pulse 81   Constitutional:  Well nourished. Alert and oriented, No acute distress. HEENT: [place] AT, moist mucus membranes.  Trachea midline Cardiovascular: No clubbing, cyanosis, or edema. Respiratory: Normal respiratory effort, no increased work of breathing. Neurologic: Grossly intact, no focal deficits, moving all 4 extremities. Psychiatric: Normal mood and affect.  Laboratory Data: Lab Results  Component Value Date   WBC 7.7 06/14/2023   HGB 13.8 06/14/2023   HCT 41.7 06/14/2023   MCV 92.9 06/14/2023   PLT 220 06/14/2023    Lab Results  Component Value Date   CREATININE 1.29 (H) 06/14/2023    Lab Results  Component Value Date   TSH 2.631 07/04/2022    Lab Results  Component Value Date   AST 20 06/14/2023   Lab Results  Component Value Date   ALT 15 06/14/2023    Urinalysis I have reviewed the labs.  See HPI.    Pertinent Imaging:  07/12/23 16:10  Scan Result 25ml    Assessment & Plan:    1. OAB -UA bland -urine sent for culture to rule out indolent infection -PVR demonstrates adequate emptying -I gave him Gemtesa samples 25 mg daily and also gave him the brochure on PTNS  2. BPH with LU TS -s/p HoLEP   -Discussed with patient that the AUA recommends no more screening for prostate cancer and men age 61 years of age or older due to the treatment for prostate cancer in elderly gentleman is risky and does not outweigh the benefit -He has agreed to no further prostate cancer screenings   Return for Patient will call back to schedule an appointment .  These notes generated with voice recognition software. I apologize for typographical errors.  Cloretta Ned  [facility] [address]  [address] [place], [place] [address] [phone]

## 2023-07-12 ENCOUNTER — Ambulatory Visit (INDEPENDENT_AMBULATORY_CARE_PROVIDER_SITE_OTHER): Payer: 59 | Admitting: Urology

## 2023-07-12 ENCOUNTER — Encounter: Payer: Self-pay | Admitting: Urology

## 2023-07-12 VITALS — BP 107/72 | HR 81

## 2023-07-12 DIAGNOSIS — N138 Other obstructive and reflux uropathy: Secondary | ICD-10-CM

## 2023-07-12 DIAGNOSIS — N401 Enlarged prostate with lower urinary tract symptoms: Secondary | ICD-10-CM | POA: Diagnosis not present

## 2023-07-12 DIAGNOSIS — N3281 Overactive bladder: Secondary | ICD-10-CM

## 2023-07-12 LAB — BLADDER SCAN AMB NON-IMAGING

## 2023-07-12 MED ORDER — GEMTESA 75 MG PO TABS
75.0000 mg | ORAL_TABLET | Freq: Every day | ORAL | Status: DC
Start: 2023-07-12 — End: 2023-07-26

## 2023-07-13 LAB — URINALYSIS, COMPLETE
Bilirubin, UA: NEGATIVE
Glucose, UA: NEGATIVE
Leukocytes,UA: NEGATIVE
Nitrite, UA: NEGATIVE
RBC, UA: NEGATIVE
Specific Gravity, UA: >1.03 — ABNORMAL HIGH (ref 1.005–1.030)
Urobilinogen, Ur: 2 mg/dL — ABNORMAL HIGH (ref 0.2–1.0)
pH, UA: 6 (ref 5.0–7.5)

## 2023-07-13 LAB — MICROSCOPIC EXAMINATION

## 2023-07-16 LAB — CULTURE, URINE COMPREHENSIVE

## 2023-07-26 ENCOUNTER — Telehealth: Payer: Self-pay | Admitting: Urology

## 2023-07-26 DIAGNOSIS — N3281 Overactive bladder: Secondary | ICD-10-CM

## 2023-07-26 MED ORDER — GEMTESA 75 MG PO TABS: 75.0000 mg | ORAL_TABLET | Freq: Every day | ORAL | 3 refills | Status: AC

## 2023-07-26 NOTE — Telephone Encounter (Signed)
Pt tried Gemtesa samples and would like a new prescription sent to [facility] pharmacy.

## 2023-07-26 NOTE — Telephone Encounter (Signed)
rx sent to pharmacy by e-script

## 2023-09-17 ENCOUNTER — Ambulatory Visit: Payer: 59 | Admitting: Podiatry

## 2023-09-18 ENCOUNTER — Ambulatory Visit: Payer: 59 | Admitting: Podiatry

## 2023-10-16 ENCOUNTER — Other Ambulatory Visit: Payer: Self-pay | Admitting: Nurse Practitioner

## 2023-10-16 DIAGNOSIS — Z780 Asymptomatic menopausal state: Secondary | ICD-10-CM

## 2024-01-01 ENCOUNTER — Ambulatory Visit
Admission: RE | Admit: 2024-01-01 | Discharge: 2024-01-01 | Disposition: A | Discharge status: Home / Self Care | Payer: 59 | Source: Ambulatory Visit | Attending: Nurse Practitioner | Admitting: Nurse Practitioner

## 2024-01-01 DIAGNOSIS — Z780 Asymptomatic menopausal state: Secondary | ICD-10-CM | POA: Diagnosis present

## 2024-01-08 ENCOUNTER — Encounter: Payer: Self-pay | Admitting: *Deleted

## 2024-01-15 ENCOUNTER — Ambulatory Visit: Payer: 59 | Attending: Cardiovascular Disease | Admitting: Cardiovascular Disease

## 2024-01-15 ENCOUNTER — Encounter: Payer: Self-pay | Admitting: Cardiovascular Disease

## 2024-01-15 VITALS — BP 122/64 | HR 73 | Ht 68.0 in | Wt 219.8 lb

## 2024-01-15 DIAGNOSIS — I10 Essential (primary) hypertension: Secondary | ICD-10-CM | POA: Diagnosis not present

## 2024-01-15 DIAGNOSIS — R0602 Shortness of breath: Secondary | ICD-10-CM | POA: Diagnosis not present

## 2024-01-15 DIAGNOSIS — R9431 Abnormal electrocardiogram [ECG] [EKG]: Secondary | ICD-10-CM | POA: Diagnosis not present

## 2024-01-15 DIAGNOSIS — E785 Hyperlipidemia, unspecified: Secondary | ICD-10-CM | POA: Diagnosis not present

## 2024-01-15 NOTE — Progress Notes (Unsigned)
 Cardiology Office Note   Date:  01/15/2024   ID:  Dakota, Harper Dakota Harper 13, 1947, MRN [id number]  PCP:  [facility]  Cardiologist:   Lorine Bears, MD   No chief complaint on file.     History of Present Illness: Dakota Harper is a 78 y.o. male who was referred for evaluation of an abnormal EKG.  He has known history of essential hypertension, hyperlipidemia, chronic kidney disease, bipolar disorder and obesity.  He has known history of chronic atypical chest pain and was briefly hospitalized in 2023 for that.  He underwent a Lexiscan Myoview that showed no evidence of ischemia with normal ejection fraction.  He was referred due to an abnormal EKG showing right bundle branch block and left anterior fascicular block.  He denies dizziness or syncope.  He denies chest pain at this time but does complain of dyspnea with minimal exertion as well as lower extremity edema.  He does not take any diuretics.  He is not a smoker.  His father had CABG in his 87s.    Past Medical History:  Diagnosis Date   Anxiety    Arthritis    Asthma    Basal cell carcinoma 07/04/2021   L nasal ala. 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 excision 04/04/23   Ear pain    GERD (gastroesophageal reflux disease)    Gout    Headache    History of kidney stones    Hypercholesteremia    Hypertension    Hypothyroidism    Neuropathy    Pneumonia    Sleep disorder    Wears hearing aid in both ears     Past Surgical History:  Procedure Laterality Date   ADJACENT TISSUE TRANSFER/TISSUE REARRANGEMENT Left 12/19/2021   Procedure: ADJACENT TISSUE TRANSFER/TISSUE REARRANGEMENT;  Surgeon: Allena Napoleon, MD;  Location: [facility] OR;   Service: Plastics;  Laterality: Left;   CATARACT EXTRACTION W/PHACO Right 09/11/2022   Procedure: CATARACT EXTRACTION PHACO AND INTRAOCULAR LENS PLACEMENT (IOC) RIGHT  3.53  00:38.4;  Surgeon: Nevada Crane, MD;  Location: [facility];  Service: Ophthalmology;  Laterality: Right;   CATARACT EXTRACTION W/PHACO Left 09/25/2022   Procedure: CATARACT EXTRACTION PHACO AND INTRAOCULAR LENS PLACEMENT (IOC) LEFT;  Surgeon: Nevada Crane, MD;  Location: [facility];  Service: Ophthalmology;  Laterality: Left;  2.80 0:27.8   CHOLECYSTECTOMY     COLONOSCOPY     COLONOSCOPY WITH PROPOFOL N/A 08/01/2021   Procedure: COLONOSCOPY WITH PROPOFOL;  Surgeon: Wyline Mood, MD;  Location: [facility] ENDOSCOPY;  Service: Gastroenterology;  Laterality: N/A;   ESOPHAGOGASTRODUODENOSCOPY (EGD) WITH PROPOFOL N/A 11/30/2021   Procedure: ESOPHAGOGASTRODUODENOSCOPY (EGD) WITH PROPOFOL;  Surgeon: Wyline Mood, MD;  Location: [facility] ENDOSCOPY;  Service: Gastroenterology;  Laterality: N/A;   HERNIA REPAIR     HOLEP-LASER ENUCLEATION OF THE PROSTATE WITH MORCELLATION N/A 09/04/2022   Procedure: HOLEP-LASER ENUCLEATION OF THE PROSTATE WITH MORCELLATION;  Surgeon: Vanna Scotland, MD;  Location: [facility] ORS;  Service: Urology;  Laterality: N/A;   NASAL RECONSTRUCTION Left 12/19/2021   Procedure: NASAL RECONSTRUCTION;  Surgeon: Allena Napoleon, MD;  Location: [facility] OR;  Service: Plastics;  Laterality: Left;  2  hours   pituitary tumor excision       Current Outpatient Medications  Medication Sig Dispense Refill   acetaminophen (TYLENOL) 500 MG tablet Take 500 mg by mouth every 4 (four) hours as needed for moderate pain or mild pain.     albuterol (PROVENTIL HFA;VENTOLIN HFA) 108 (90 BASE) MCG/ACT inhaler Inhale 1 puff into the lungs 3 (three) times daily as needed for wheezing or shortness of breath.     alum & mag hydroxide-simeth (MAALOX/MYLANTA) 200-200-20 MG/5ML suspension Take 30 mLs by mouth 4 (four) times daily as  needed for indigestion or heartburn.     aspirin EC 81 MG tablet Take 81 mg by mouth at bedtime. (2100)     atorvastatin (LIPITOR) 10 MG tablet Take 10 mg by mouth every evening. (2000)     carbamide peroxide (EAR WAX REMOVAL DROPS) 6.5 % OTIC solution Place 5 drops into both ears every Sunday.     chlorhexidine (PERIDEX) 0.12 % solution Use as directed 15 mLs in the mouth or throat 2 (two) times daily as needed (oral discomfort).     cycloSPORINE (RESTASIS) 0.05 % ophthalmic emulsion Place 1 drop into both eyes 2 (two) times daily as needed (dry eyes).     diclofenac Sodium (VOLTAREN) 1 % GEL Apply 2 g topically as needed (lower left leg pain.).     diphenhydrAMINE (BENADRYL) 25 MG tablet Take 25 mg by mouth every 6 (six) hours as needed for allergies.     divalproex (DEPAKOTE) 250 MG DR tablet Take 250 mg by mouth 2 (two) times daily.     fenofibrate 54 MG tablet Take 1 tablet (54 mg total) by mouth daily.     finasteride (PROSCAR) 5 MG tablet Take 1 tablet (5 mg total) by mouth daily. 90 tablet 3   fludrocortisone (FLORINEF) 0.1 MG tablet Take 100 mcg by mouth daily.     Fluocinolone Acetonide Body 0.01 % OIL Apply to scalp 1-2 times a day and leave on. 118.28 mL 6   fluocinonide ointment (LIDEX) 0.05 % Apply 1 application  topically 2 (two) times daily as needed (pruritus). (apply to legs)     fluticasone furoate-vilanterol (BREO ELLIPTA) 200-25 MCG/INH AEPB Inhale 1 puff into the lungs daily. (0800)     gabapentin (NEURONTIN) 100 MG capsule Take 100 mg by mouth at bedtime. Take along with one 400 mg capsule for total dose 500 mg daily at bedtime     gabapentin (NEURONTIN) 400 MG capsule Take 400 mg by mouth at bedtime. Take along with one 100 mg capsule for total dose 500 mg daily at bedtime     guaiFENesin (ROBITUSSIN) 100 MG/5ML liquid Take 300 mg by mouth every 6 (six) hours as needed for cough.     HYDROcodone-acetaminophen (NORCO/VICODIN) 5-325 MG tablet Take 1-2 tablets by mouth every 6  (six) hours as needed for moderate pain. 10 tablet 0   ketoconazole (NIZORAL) 2 % cream Apply 1 Application topically daily.     ketoconazole (NIZORAL) 2 % shampoo Massage into scalp daily and let sit several minutes before rinsing. 120 mL 4   lactulose (CHRONULAC) 10 GM/15ML solution Take 30 mLs (20 g total) by mouth daily as needed for mild constipation. 120 mL 0   levothyroxine (SYNTHROID, LEVOTHROID) 50 MCG tablet Take 50 mcg by mouth daily before breakfast. (0630)     loperamide (IMODIUM) 2 MG capsule Take 4 mg by mouth as needed for diarrhea or loose stools.     magnesium hydroxide (  MILK OF MAGNESIA) 400 MG/5ML suspension Take 30 mLs by mouth daily as needed (constipation (no bowel movement for 3 days)).     mirtazapine (REMERON) 15 MG tablet TAKE 1 TABLET BY MOUTH EVERY DAY BEFORE BEDTIME     montelukast (SINGULAIR) 5 MG chewable tablet Chew 5 mg by mouth at bedtime. (2100)     nortriptyline (PAMELOR) 75 MG capsule Take 150 mg by mouth daily. (0900)     omeprazole (PRILOSEC) 40 MG capsule Take 1 capsule (40 mg total) by mouth daily. 90 capsule 0   ondansetron (ZOFRAN-ODT) 4 MG disintegrating tablet Take 1 tablet (4 mg total) by mouth every 8 (eight) hours as needed for nausea or vomiting. 20 tablet 0   paliperidone (INVEGA) 3 MG 24 hr tablet Take 3 mg by mouth at bedtime.     permethrin (ELIMITE) 5 % cream APPLY 1 APPLICATIONS TOPICALLY EVERY MORNING AND EVERY NIGHT AT BEDTIME TO FACE AND SCALP 60 g 1   polyethylene glycol (MIRALAX / GLYCOLAX) 17 g packet Take 17 g by mouth daily as needed (constipation.).     ramelteon (ROZEREM) 8 MG tablet Take 8 mg by mouth at bedtime.     Skin Protectants, Misc. (EUCERIN) cream Apply 1 application  topically in the morning and at bedtime. (0800 & 2000)     tadalafil (CIALIS) 20 MG tablet Take one tablet at least 60 minutes prior to sexual activity. 30 tablet 0   tamsulosin (FLOMAX) 0.4 MG CAPS capsule Take 0.4 mg by mouth at bedtime. (2100)      tiZANidine (ZANAFLEX) 2 MG tablet Take 2 mg by mouth at bedtime. (2000)     valproic acid (DEPAKENE) 250 MG capsule Take 250 mg by mouth 2 (two) times daily.     Vibegron (GEMTESA) 75 MG TABS Take 1 tablet (75 mg total) by mouth daily. 90 tablet 3   amLODipine (NORVASC) 5 MG tablet Take 1 tablet (5 mg total) by mouth daily. 30 tablet 0   No current facility-administered medications for this visit.    Allergies:   Saw palmetto, Other, Sulfa antibiotics, and Sulfacetamide sodium    Social History:  The patient  reports that he has quit smoking. His smoking use included cigarettes. He has never used smokeless tobacco. He reports that he does not drink alcohol and does not use drugs.   Family History:  The patient's family history includes Alzheimer's disease in his father; Benign prostatic hyperplasia in his brother; Diabetes Mellitus II in his sister; Heart attack in his father and sister; Kidney cancer in his mother.    ROS:  Please see the history of present illness.   Otherwise, review of systems are positive for none.   All other systems are reviewed and negative.    PHYSICAL EXAM: VS:  BP 122/64 (BP Location: Left Arm, Patient Position: Sitting, Cuff Size: Normal)   Pulse 73   Ht 5\' 8"  (1.727 m)   Wt 219 lb 12.8 oz (99.7 kg)   BMI 33.42 kg/m  , BMI Body mass index is 33.42 kg/m. GEN: Well nourished, well developed, in no acute distress  HEENT: normal  Neck: no JVD, carotid bruits, or masses Cardiac: RRR; no  rubs, or gallops, 1 out of 6 systolic murmur in the aortic area.  +1 edema bilaterally Respiratory:  clear to auscultation bilaterally, normal work of breathing GI: soft, nontender, nondistended, + BS MS: no deformity or atrophy  Skin: warm and dry, no rash Neuro:  Strength and sensation are  intact Psych: euthymic mood, full affect   EKG:  EKG is ordered today. The ekg ordered today demonstrates : Sinus rhythm Left axis deviation Right bundle branch block When  compared with ECG of 14-Jun-2023 21:02, No significant change was found    Recent Labs: 06/14/2023: ALT 15; BUN 23; Creatinine, Ser 1.29; Hemoglobin 13.8; Platelets 220; Potassium 3.8; Sodium 138    Lipid Panel No results found for: "CHOL", "TRIG", "HDL", "CHOLHDL", "VLDL", "LDLCALC", "LDLDIRECT"    Wt Readings from Last 3 Encounters:  01/15/24 219 lb 12.8 oz (99.7 kg)  06/14/23 206 lb (93.4 kg)  09/25/22 207 lb (93.9 kg)           No data to display            ASSESSMENT AND PLAN:  1.  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  Continue to monitor.  2.  Exertional dyspnea: He has significant lower extremity edema but otherwise no convincing evidence of volume overload.  I requested an echocardiogram to evaluate ejection fraction and diastolic function.  He does have a cardiac murmur that seems to be due to aortic sclerosis.  3.  Essential hypertension: Controlled on current medications.  Amlodipine might be contributing some to his lower extremity edema.  4.  Hyperlipidemia: Currently on atorvastatin and fenofibrate.  Most recent lipid profile showed an LDL of 92 and triglyceride of 50.    Disposition:   FU with me in 1 year or earlier if echocardiogram is abnormal.  Signed,  Lorine Bears, MD  01/15/2024 3:28 PM    [facility]

## 2024-01-15 NOTE — Patient Instructions (Signed)
 Medication Instructions:  No changes *If you need a refill on your cardiac medications before your next appointment, please call your pharmacy*   Lab Work: None ordered If you have labs (blood work) drawn today and your tests are completely normal, you will receive your results only by: MyChart Message (if you have MyChart) OR A paper copy in the mail If you have any lab test that is abnormal or we need to change your treatment, we will call you to review the results.   Testing/Procedures: Your physician has requested that you have an echocardiogram. Echocardiography is a painless test that uses sound waves to create images of your heart. It provides your doctor with information about the size and shape of your heart and how well your heart's chambers and valves are working.   You may receive an ultrasound enhancing agent through an IV if needed to better visualize your heart during the echo. This procedure takes approximately one hour.  There are no restrictions for this procedure.  This will take place at [address] [facility]) [address], [place] [address]  Please note: We ask at that you not bring children with you during ultrasound (echo/ vascular) testing. Due to room size and safety concerns, children are not allowed in the ultrasound rooms during exams. Our front office staff cannot provide observation of children in our lobby area while testing is being conducted. An adult accompanying a patient to their appointment will only be allowed in the ultrasound room at the discretion of the ultrasound technician under special circumstances. We apologize for any inconvenience.    Follow-Up: At [facility], you and your health needs are our priority.  As part of our continuing mission to provide you with exceptional heart care, we have created designated Provider Care Teams.  These Care Teams include your primary Cardiologist (physician) and Advanced Practice  Providers (APPs -  Physician Assistants and Nurse Practitioners) who all work together to provide you with the care you need, when you need it.  We recommend signing up for the patient portal called "MyChart".  Sign up information is provided on this After Visit Summary.  MyChart is used to connect with patients for Virtual Visits (Telemedicine).  Patients are able to view lab/test results, encounter notes, upcoming appointments, etc.  Non-urgent messages can be sent to your provider as well.   To learn more about what you can do with MyChart, go to [email or web address].    Your next appointment:   12 month(s)  Provider:   You may see Dr. Kirke Corin or one of the following Advanced Practice Providers on your designated Care Team:   Nicolasa Ducking, NP Eula Listen, PA-C Cadence Fransico Michael, PA-C Charlsie Quest, NP Carlos Levering, NP

## 2024-02-08 ENCOUNTER — Ambulatory Visit

## 2024-02-18 ENCOUNTER — Ambulatory Visit (INDEPENDENT_AMBULATORY_CARE_PROVIDER_SITE_OTHER): Payer: 59 | Admitting: Dermatology

## 2024-02-18 DIAGNOSIS — B880 Other acariasis: Secondary | ICD-10-CM | POA: Diagnosis not present

## 2024-02-18 DIAGNOSIS — L814 Other melanin hyperpigmentation: Secondary | ICD-10-CM

## 2024-02-18 DIAGNOSIS — Z1283 Encounter for screening for malignant neoplasm of skin: Secondary | ICD-10-CM | POA: Diagnosis not present

## 2024-02-18 DIAGNOSIS — D489 Neoplasm of uncertain behavior, unspecified: Secondary | ICD-10-CM

## 2024-02-18 DIAGNOSIS — Z85828 Personal history of other malignant neoplasm of skin: Secondary | ICD-10-CM

## 2024-02-18 DIAGNOSIS — Z86018 Personal history of other benign neoplasm: Secondary | ICD-10-CM

## 2024-02-18 DIAGNOSIS — D1801 Hemangioma of skin and subcutaneous tissue: Secondary | ICD-10-CM

## 2024-02-18 DIAGNOSIS — L720 Epidermal cyst: Secondary | ICD-10-CM

## 2024-02-18 DIAGNOSIS — D492 Neoplasm of unspecified behavior of bone, soft tissue, and skin: Secondary | ICD-10-CM

## 2024-02-18 DIAGNOSIS — D225 Melanocytic nevi of trunk: Secondary | ICD-10-CM

## 2024-02-18 DIAGNOSIS — L578 Other skin changes due to chronic exposure to nonionizing radiation: Secondary | ICD-10-CM

## 2024-02-18 DIAGNOSIS — L2989 Other pruritus: Secondary | ICD-10-CM

## 2024-02-18 DIAGNOSIS — L820 Inflamed seborrheic keratosis: Secondary | ICD-10-CM | POA: Diagnosis not present

## 2024-02-18 DIAGNOSIS — D044 Carcinoma in situ of skin of scalp and neck: Secondary | ICD-10-CM | POA: Diagnosis not present

## 2024-02-18 DIAGNOSIS — L738 Other specified follicular disorders: Secondary | ICD-10-CM

## 2024-02-18 DIAGNOSIS — D229 Melanocytic nevi, unspecified: Secondary | ICD-10-CM

## 2024-02-18 DIAGNOSIS — W908XXA Exposure to other nonionizing radiation, initial encounter: Secondary | ICD-10-CM

## 2024-02-18 DIAGNOSIS — L821 Other seborrheic keratosis: Secondary | ICD-10-CM

## 2024-02-18 DIAGNOSIS — D099 Carcinoma in situ, unspecified: Secondary | ICD-10-CM

## 2024-02-18 HISTORY — DX: Carcinoma in situ, unspecified: D09.9

## 2024-02-18 MED ORDER — PERMETHRIN 5 % EX CREA
TOPICAL_CREAM | CUTANEOUS | 1 refills | Status: DC
Start: 2024-02-18 — End: 2024-07-31

## 2024-02-18 NOTE — Progress Notes (Signed)
 Follow-Up Visit   Subjective  Dakota Harper is a 78 y.o. male who presents for the following: Skin Cancer Screening and Upper Body Skin Exam Reports a spot at scalp, growth on back and some spots on face, also reports needs refill of Permethrin 5 % cream feels he has mites on scalp.  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   Hx of dysplastic, hx of bcc L nasal ala (Mohs), hx of aks at scalp and left brow  The patient presents for Upper Body Skin Exam (UBSE) for skin cancer screening and mole check. The patient has spots, moles and lesions to be evaluated, some may be new or changing and the patient may have concern these could be cancer.    The following portions of the chart were reviewed this encounter and updated as appropriate: medications, allergies, medical history  Review of Systems:  No other skin or systemic complaints except as noted in HPI or Assessment and Plan.  Objective  Well appearing patient in no apparent distress; mood and affect are within normal limits.  All skin waist up examined. Relevant physical exam findings are noted in the Assessment and Plan.               right lower sacral x 1 Erythematous stuck-on, waxy papule left posterior crown 1.6 x 1.2 mm pink crusted plaque    Assessment & Plan   INFLAMED SEBORRHEIC KERATOSIS right lower sacral x 1 Symptomatic, irritating, patient would like treated. Destruction of lesion - right lower sacral x 1  Destruction method: cryotherapy   Informed consent: discussed and consent obtained   Lesion destroyed using liquid nitrogen: Yes   Region frozen until ice ball extended beyond lesion: Yes   Outcome: patient tolerated procedure well with no complications   Post-procedure details: wound care instructions given   Additional details:  Prior to procedure, discussed risks of blister formation, small wound, skin dyspigmentation, or rare scar following cryotherapy. Recommend Vaseline ointment to treated  areas while healing.  NEOPLASM OF UNCERTAIN BEHAVIOR left posterior crown Skin / nail biopsy Type of biopsy: tangential   Informed consent: discussed and consent obtained   Patient was prepped and draped in usual sterile fashion: Area prepped with alcohol. 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 Related Medications permethrin (ELIMITE) 5 % cream APPLY 1 APPLICATIONS TOPICALLY EVERY MORNING AND EVERY NIGHT AT BEDTIME TO FACE AND SCALP   Skin cancer screening  performed today.  Actinic Damage - Chronic condition, secondary to cumulative UV/sun exposure - diffuse scaly erythematous macules with underlying dyspigmentation - Recommend daily broad spectrum sunscreen SPF 30+ to sun-exposed areas, reapply every 2 hours as needed.  - Staying in the shade or wearing long sleeves, sun glasses  (UVA+UVB protection) and wide brim hats (4-inch brim around the entire circumference of the hat) are also recommended for sun protection.  - Call for new or changing lesions.  Lentigines, Seborrheic Keratoses, Hemangiomas - Benign normal skin lesions - Benign-appearing - Call for any changes  Melanocytic Nevi - 4.5 mm medium dark brown macule R abdomen, stable compared to previous photo - multiple nevi at chest, abdomen and back see photos - Tan-brown and/or pink-flesh-colored symmetric macules and papules - Benign appearing on exam today - Observation - Call clinic for new or changing moles - Recommend daily use of broad spectrum spf 30+ sunscreen to sun-exposed areas.   Sebaceous Hyperplasia At forehead - Small yellow papules with a central dell - Benign - Observe  MILIA Exam: tiny erythematous firm white papules At glabella and R perioral  Treatment plan:  Discussed this is a type of cyst. Benign-appearing. Sometimes these will clear with OTC adapalene/Differin 0.1% cream QHS or retinol. Discussed extraction if symptomatic.  Topical retinoid medications like tretinoin/Retin-A, adapalene/Differin, tazarotene/Fabior, and Epiduo/Epiduo Forte can cause dryness and irritation when first started. Only apply a pea-sized amount to the entire affected area. Avoid applying it around the eyes, edges of mouth and creases at the nose. If you experience irritation, use a good moisturizer first and/or apply the medicine less often. If you are doing well with the medicine, you can increase how often you use it until you are applying every night. Be careful with sun protection while using this medication as it can make you sensitive to the sun. This medicine should not be used by pregnant women.   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  Call if any new or changing lesions are noted between office visits  HISTORY OF BASAL CELL CARCINOMA OF THE SKIN 07/04/2021 - left nasal ala - Mohs completed 12/13/2021 - No evidence of recurrence today - Recommend regular full body skin exams - Recommend daily broad spectrum sunscreen SPF 30+ to sun-exposed areas, reapply every 2 hours as needed.  - Call if any new or changing lesions are noted between office visits    Return in about 6 months (around 08/19/2024) for spot check .  I, Randee Busing, CMA, am acting as scribe for Artemio Larry, MD.   Documentation: I have reviewed the above documentation for accuracy and completeness, and I agree with the above.  Artemio Larry, MD

## 2024-02-18 NOTE — Patient Instructions (Addendum)
 For bumps on face  Use over the counter adapalene 0.1 gel pea sized amount to areas face    Topical retinoid medications like tretinoin/Retin-A, adapalene/Differin, tazarotene/Fabior, and Epiduo/Epiduo Forte can cause dryness and irritation when first started. Only apply a pea-sized amount to the entire affected area. Avoid applying it around the eyes, edges of mouth and creases at the nose. If you experience irritation, use a good moisturizer first and/or apply the medicine less often. If you are doing well with the medicine, you can increase how often you use it until you are applying every night. Be careful with sun protection while using this medication as it can make you sensitive to the sun. This medicine should not be used by pregnant women.       Start permethrin twice daily to affected areas at scalp and face x 2 months   Recommend OTC Gold Bond Rapid Relief Anti-Itch cream (pramoxine + menthol), CeraVe Anti-itch cream or lotion (pramoxine), Sarna lotion (Original- menthol + camphor or Sensitive- pramoxine) or Eucerin 12 hour Itch Relief lotion (menthol) up to 3 times per day to areas on body that are itchy.     Biopsy Wound Care Instructions  Leave the original bandage on for 24 hours if possible.  If the bandage becomes soaked or soiled before that time, it is OK to remove it and examine the wound.  A small amount of post-operative bleeding is normal.  If excessive bleeding occurs, remove the bandage, place gauze over the site and apply continuous pressure (no peeking) over the area for 30 minutes. If this does not work, please call our clinic as soon as possible or page your doctor if it is after hours.   Once a day, cleanse the wound with soap and water. It is fine to shower. If a thick crust develops you may use a Q-tip dipped into dilute hydrogen peroxide (mix 1:1 with water) to dissolve it.  Hydrogen peroxide can slow the healing process, so use it only as needed.    After  washing, apply petroleum jelly (Vaseline) or an antibiotic ointment if your doctor prescribed one for you, followed by a bandage.    For best healing, the wound should be covered with a layer of ointment at all times. If you are not able to keep the area covered with a bandage to hold the ointment in place, this may mean re-applying the ointment several times a day.  Continue this wound care until the wound has healed and is no longer open.   Itching and mild discomfort is normal during the healing process. However, if you develop pain or severe itching, please call our office.   If you have any discomfort, you can take Tylenol (acetaminophen) or ibuprofen as directed on the bottle. (Please do not take these if you have an allergy to them or cannot take them for another reason).  Some redness, tenderness and white or yellow material in the wound is normal healing.  If the area becomes very sore and red, or develops a thick yellow-green material (pus), it may be infected; please notify us.    If you have stitches, return to clinic as directed to have the stitches removed. You will continue wound care for 2-3 days after the stitches are removed.   Wound healing continues for up to one year following surgery. It is not unusual to experience pain in the scar from time to time during the interval.  If the pain becomes severe or the  scar thickens, you should notify the office.    A slight amount of redness in a scar is expected for the first six months.  After six months, the redness will fade and the scar will soften and fade.  The color difference becomes less noticeable with time.  If there are any problems, return for a post-op surgery check at your earliest convenience.  To improve the appearance of the scar, you can use silicone scar gel, cream, or sheets (such as Mederma or Serica) every night for up to one year. These are available over the counter (without a prescription).  Please call our  office at [phone] for any questions or concerns.      Seborrheic Keratosis  What causes seborrheic keratoses? Seborrheic keratoses are harmless, common skin growths that first appear during adult life.  As time goes by, more growths appear.  Some people may develop a large number of them.  Seborrheic keratoses appear on both covered and uncovered body parts.  They are not caused by sunlight.  The tendency to develop seborrheic keratoses can be inherited.  They vary in color from skin-colored to gray, brown, or even black.  They can be either smooth or have a rough, warty surface.   Seborrheic keratoses are superficial and look as if they were stuck on the skin.  Under the microscope this type of keratosis looks like layers upon layers of skin.  That is why at times the top layer may seem to fall off, but the rest of the growth remains and re-grows.    Treatment Seborrheic keratoses do not need to be treated, but can easily be removed in the office.  Seborrheic keratoses often cause symptoms when they rub on clothing or jewelry.  Lesions can be in the way of shaving.  If they become inflamed, they can cause itching, soreness, or burning.  Removal of a seborrheic keratosis can be accomplished by freezing, burning, or surgery. If any spot bleeds, scabs, or grows rapidly, please return to have it checked, as these can be an indication of a skin cancer.   Cryotherapy Aftercare  Wash gently with soap and water everyday.   Apply Vaseline and Band-Aid daily until healed.     Melanoma ABCDEs  Melanoma is the most dangerous type of skin cancer, and is the leading cause of death from skin disease.  You are more likely to develop melanoma if you: Have light-colored skin, light-colored eyes, or red or blond hair Spend a lot of time in the sun Tan regularly, either outdoors or in a tanning bed Have had blistering sunburns, especially during childhood Have a close family member who has had a  melanoma Have atypical moles or large birthmarks  Early detection of melanoma is key since treatment is typically straightforward and cure rates are extremely high if we catch it early.   The first sign of melanoma is often a change in a mole or a new dark spot.  The ABCDE system is a way of remembering the signs of melanoma.  A for asymmetry:  The two halves do not match. B for border:  The edges of the growth are irregular. C for color:  A mixture of colors are present instead of an even brown color. D for diameter:  Melanomas are usually (but not always) greater than 6mm - the size of a pencil eraser. E for evolution:  The spot keeps changing in size, shape, and color.  Please check your skin once per month  between visits. You can use a small mirror in front and a large mirror behind you to keep an eye on the back side or your body.   If you see any new or changing lesions before your next follow-up, please call to schedule a visit.  Please continue daily skin protection including broad spectrum sunscreen SPF 30+ to sun-exposed areas, reapplying every 2 hours as needed when you're outdoors.   Staying in the shade or wearing long sleeves, sun glasses (UVA+UVB protection) and wide brim hats (4-inch brim around the entire circumference of the hat) are also recommended for sun protection.    Due to recent changes in healthcare laws, you may see results of your pathology and/or laboratory studies on MyChart before the doctors have had a chance to review them. We understand that in some cases there may be results that are confusing or concerning to you. Please understand that not all results are received at the same time and often the doctors may need to interpret multiple results in order to provide you with the best plan of care or course of treatment. Therefore, we ask that you please give [place] 2 business days to thoroughly review all your results before contacting the office for clarification.  Should we see a critical lab result, you will be contacted sooner.   If You Need Anything After Your Visit  If you have any questions or concerns for your doctor, please call our main line at [phone] and press option 4 to reach your doctor's medical assistant. If no one answers, please leave a voicemail as directed and we will return your call as soon as possible. Messages left after 4 pm will be answered the following business day.   You may also send [place] a message via MyChart. We typically respond to MyChart messages within 1-2 business days.  For prescription refills, please ask your pharmacy to contact our office. Our fax number is [phone].  If you have an urgent issue when the clinic is closed that cannot wait until the next business day, you can page your doctor at the number below.    Please note that while we do our best to be available for urgent issues outside of office hours, we are not available 24/7.   If you have an urgent issue and are unable to reach [place], you may choose to seek medical care at your doctor's office, retail clinic, urgent care center, or emergency room.  If you have a medical emergency, please immediately call 911 or go to the emergency department.  Pager Numbers  - Dr. Gwen Pounds: [phone]  - Dr. Roseanne Reno: [phone]  - Dr. Katrinka Blazing: [phone]   In the event of inclement weather, please call our main line at [phone] for an update on the status of any delays or closures.  Dermatology Medication Tips: Please keep the boxes that topical medications come in in order to help keep track of the instructions about where and how to use these. Pharmacies typically print the medication instructions only on the boxes and not directly on the medication tubes.   If your medication is too expensive, please contact our office at [phone] option 4 or send [place] a message through MyChart.   We are unable to tell what your co-pay for medications will be  in advance as this is different depending on your insurance coverage. However, we may be able to find a substitute medication at lower cost or fill out paperwork to get insurance to cover a  needed medication.   If a prior authorization is required to get your medication covered by your insurance company, please allow us  1-2 business days to complete this process.  Drug prices often vary depending on where the prescription is filled and some pharmacies may offer cheaper prices.  The website www.goodrx.com contains coupons for medications through different pharmacies. The prices here do not account for what the cost may be with help from insurance (it may be cheaper with your insurance), but the website can give you the price if you did not use any insurance.  - You can print the associated coupon and take it with your prescription to the pharmacy.  - You may also stop by our office during regular business hours and pick up a GoodRx coupon card.  - If you need your prescription sent electronically to a different pharmacy, notify our office through [facility] or by phone at [phone] option 4.     Si Usted Necesita Algo Despus de Su Visita  Tambin puede enviarnos un mensaje a travs de Clinical cytogeneticist. Por lo general respondemos a los mensajes de MyChart en el transcurso de 1 a 2 das hbiles.  Para renovar recetas, por favor pida a su farmacia que se ponga en contacto con nuestra oficina. Franz Jacks de fax es [place] [phone].  Si tiene un asunto urgente cuando la clnica est cerrada y que no puede esperar hasta el siguiente da hbil, puede llamar/localizar a su doctor(a) al nmero que aparece a continuacin.   Por favor, tenga en cuenta que aunque hacemos todo lo posible para estar disponibles para asuntos urgentes fuera del horario de [place], no estamos disponibles las 24 horas del da, los 7 [address]  [address] de la [place].   Si tiene un problema urgente y no puede comunicarse con nosotros,  puede optar por buscar atencin mdica  en el consultorio de su doctor(a), en una clnica privada, en un centro de atencin urgente o en una sala de emergencias.  Si tiene Engineer, drilling, por favor llame inmediatamente al 911 o vaya a la sala de emergencias.  Nmeros de bper  - Dr. Bary Likes: [phone]  - Dra. Annette Barters: [phone]  - Dr. Felipe Horton: [phone]   En caso de inclemencias del tiempo, por favor llame a Lajuan Pila principal al [phone] para una actualizacin sobre el [place] de cualquier retraso o cierre.  Consejos para la medicacin en dermatologa: Por favor, guarde las cajas en las que vienen los medicamentos de uso tpico para ayudarle a seguir las instrucciones sobre dnde y cmo usarlos. Las farmacias generalmente imprimen las instrucciones del medicamento slo en las cajas y no directamente en los tubos del [place].   Si su medicamento es muy caro, por favor, pngase en contacto con Bettyjane Brunet llamando al [phone] y presione la opcin 4 o envenos un mensaje a travs de Clinical cytogeneticist.   No podemos decirle cul ser su copago por los medicamentos por adelantado ya que esto es diferente dependiendo de la cobertura de su seguro. Sin embargo, es posible que podamos encontrar un medicamento sustituto a Audiological scientist un formulario para que el seguro cubra el medicamento que se considera necesario.   Si se requiere una autorizacin previa para que su compaa de seguros [place] su medicamento, por favor permtanos de 1 a 2 das hbiles para completar este proceso.  Los precios de los medicamentos varan con frecuencia dependiendo del Environmental consultant de dnde se surte la receta y alguna farmacias pueden ofrecer precios ms baratos.  El sitio web www.goodrx.com tiene cupones para medicamentos de Health and safety inspector. Los precios aqu no tienen en cuenta lo que podra costar con la ayuda del seguro (puede ser ms barato con su seguro), pero el sitio web puede darle  el precio si no utiliz Tourist information centre manager.  - Puede imprimir el cupn correspondiente y llevarlo con su receta a la farmacia.  - Tambin puede pasar por nuestra oficina durante el horario de atencin regular y Education officer, museum una tarjeta de cupones de GoodRx.  - Si necesita que su receta se enve electrnicamente a una farmacia diferente, informe a nuestra oficina a travs de MyChart de Index o por telfono llamando al [phone] y presione la opcin 4.

## 2024-02-21 LAB — SURGICAL PATHOLOGY

## 2024-02-25 ENCOUNTER — Telehealth: Payer: Self-pay

## 2024-02-25 NOTE — Telephone Encounter (Signed)
 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 Patient scheduled 03/19/24 at 10:45 AM.

## 2024-02-25 NOTE — Telephone Encounter (Signed)
-----   Message from Artemio Larry sent at 02/25/2024 12:31 PM EDT ----- 1. 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

## 2024-02-26 ENCOUNTER — Encounter: Payer: Self-pay | Admitting: Endocrinology

## 2024-02-27 ENCOUNTER — Other Ambulatory Visit: Payer: Self-pay | Admitting: Endocrinology

## 2024-02-27 DIAGNOSIS — D352 Benign neoplasm of pituitary gland: Secondary | ICD-10-CM

## 2024-03-04 ENCOUNTER — Ambulatory Visit (INDEPENDENT_AMBULATORY_CARE_PROVIDER_SITE_OTHER): Admitting: Podiatry

## 2024-03-04 ENCOUNTER — Encounter: Payer: Self-pay | Admitting: Podiatry

## 2024-03-04 DIAGNOSIS — M19072 Primary osteoarthritis, left ankle and foot: Secondary | ICD-10-CM | POA: Diagnosis not present

## 2024-03-04 MED ORDER — METHYLPREDNISOLONE 4 MG PO TBPK: ORAL_TABLET | ORAL | 0 refills | Status: DC

## 2024-03-04 MED ORDER — BETAMETHASONE SOD PHOS & ACET 6 (3-3) MG/ML IJ SUSP
3.0000 mg | Freq: Once | INTRAMUSCULAR | Status: AC
Start: 2024-03-04 — End: 2024-03-04
  Administered 2024-03-04: 3 mg via INTRA_ARTICULAR

## 2024-03-04 NOTE — Progress Notes (Signed)
 Chief Complaint  Patient presents with   Foot Pain    "[facility] said I have Arthritis in my foot.  They gave me a Medrol  Steroid Pack.  I want to see if I can get some on hand in case it flairs up again.  It doesn't hurt right now." N - Arthritis L - lateral dorsal left D - 2 weeks O - suddenly C - sharp pain, burning A - walking T - went to [facility] - xrays and gave me Medrol  steroid pack    Subjective:  78 y.o. male presenting today for evaluation of left ankle pain.  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  He was prescribed a prednisone  pack which did help significantly with some of his joint pain.  He has had intermittent flareups since that time.  Past Medical History:  Diagnosis Date   Anxiety    Arthritis    Asthma    Basal cell carcinoma 07/04/2021   L nasal ala. 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   Left posterior crown, needs EDC   Wears hearing aid in both ears     Past Surgical History:  Procedure Laterality Date   ADJACENT TISSUE TRANSFER/TISSUE REARRANGEMENT Left 12/19/2021   Procedure: ADJACENT TISSUE TRANSFER/TISSUE REARRANGEMENT;  Surgeon: Barb Bonito, MD;  Location: [facility] OR;  Service: Plastics;  Laterality: Left;   CATARACT EXTRACTION W/PHACO Right 09/11/2022   Procedure: CATARACT EXTRACTION PHACO AND INTRAOCULAR LENS PLACEMENT (IOC) RIGHT  3.53  00:38.4;  Surgeon:  Rosa College, MD;  Location[facility];  Service: Ophthalmology;  Laterality: Right;   CATARACT EXTRACTION W/PHACO Left 09/25/2022   Procedure: CATARACT EXTRACTION PHACO AND INTRAOCULAR LENS PLACEMENT (IOC) LEFT;  Surgeon: Rosa College, MD;  Location: [facility];  Service: Ophthalmology;  Laterality: Left;  2.80 0:27.8   CHOLECYSTECTOMY     COLONOSCOPY     COLONOSCOPY WITH PROPOFOL  N/A 08/01/2021   Procedure: COLONOSCOPY WITH PROPOFOL ;  Surgeon: Luke Salaam, MD;  Location: [facility] END[facility];  Service: Gastroenterology;  Laterality: N/A;   ESOPHAGOGASTRODUODENOSCOPY (EGD) WITH PROPOFOL  N/A 11/30/2021   Procedure: ESOPHAGOGASTRODUODENOSCOPY (EGD) WITH PROPOFOL ;  Surgeon: Luke Salaam, MD;  Location: [facility] ENDOSCOPY;  Service: Gastroenterology;  Laterality: N/A;   HERNIA REPAIR     HOLEP-LASER ENUCLEATION OF THE PROSTATE WITH MORCELLATION N/A 09/04/2022   Procedure: HOLEP-LASER ENUCLEATION OF THE PROSTATE WITH MORCELLATION;  Surgeon: Dustin Gimenez, MD;  Location: [facility] ORS;  Service: Urology;  Laterality: N/A;   NASAL RECONSTRUCTION Left 12/19/2021   Procedure: NASAL RECONSTRUCTION;  Surgeon: Barb Bonito, MD;  Location: [facility] OR;  Service: Plastics;  Laterality: Left;  2 hours   pituitary tumor excision      Allergies  Allergen Reactions   Saw Palmetto Hives  and Rash    "Berry form"   Other Other (See Comments) and Rash   Sulfa Antibiotics Rash   Sulfacetamide Sodium Rash    Objective / Physical Exam:  General:  The patient is alert and oriented x3 in no acute distress. Dermatology:  Skin is warm, dry and supple bilateral lower extremities. Negative for open lesions or macerations. Vascular:  Palpable pedal pulses bilaterally. No edema or erythema noted. Capillary refill within normal limits. Neurological:  Grossly intact via light touch Musculoskeletal Exam:  Pain on palpation to the anterior lateral medial aspects of the patient's left ankle. Mild edema  noted. Range of motion within normal limits to all pedal and ankle joints bilateral. Muscle strength 5/5 in all groups bilateral.   Assessment: 1.  Arthritis/capsulitis left ankle  Plan of Care:  -Patient evaluated.  X-rays reviewed that were taken at [facility] -Injection of 0.5 cc Celestone  Soluspan injected into the left ankle joint -Continue wearing good supportive tennis shoes and sneakers.  Refrain from going barefoot -No NSAIDs.  BMP 02/11/2024 low GFR -Prescription provided for Medrol  Dosepak for acute flareups -Return to clinic as needed   Dot Gazella, DPM [facility]  Dr. Dot Gazella, DPM    [address] [place], [place] [address]                Office [phone]  Fax [phone]

## 2024-03-11 ENCOUNTER — Ambulatory Visit: Attending: Cardiovascular Disease

## 2024-03-11 DIAGNOSIS — R0602 Shortness of breath: Secondary | ICD-10-CM | POA: Diagnosis not present

## 2024-03-12 LAB — ECHOCARDIOGRAM COMPLETE
AR max vel: 2.51 cm2
AV Area VTI: 2.38 cm2
AV Area mean vel: 2.47 cm2
AV Mean grad: 4 mmHg
AV Peak grad: 7.3 mmHg
Ao pk vel: 1.35 m/s
Area-P 1/2: 3.65 cm2
S' Lateral: 3.5 cm
Single Plane A4C EF: 55.2 %

## 2024-03-18 ENCOUNTER — Ambulatory Visit: Admitting: Podiatry

## 2024-03-19 ENCOUNTER — Encounter: Payer: Self-pay | Admitting: Dermatology

## 2024-03-19 ENCOUNTER — Ambulatory Visit: Admitting: Dermatology

## 2024-03-19 DIAGNOSIS — D044 Carcinoma in situ of skin of scalp and neck: Secondary | ICD-10-CM | POA: Diagnosis not present

## 2024-03-19 DIAGNOSIS — L570 Actinic keratosis: Secondary | ICD-10-CM | POA: Diagnosis not present

## 2024-03-19 DIAGNOSIS — L820 Inflamed seborrheic keratosis: Secondary | ICD-10-CM | POA: Diagnosis not present

## 2024-03-19 DIAGNOSIS — W908XXA Exposure to other nonionizing radiation, initial encounter: Secondary | ICD-10-CM

## 2024-03-19 DIAGNOSIS — L578 Other skin changes due to chronic exposure to nonionizing radiation: Secondary | ICD-10-CM

## 2024-03-19 DIAGNOSIS — D099 Carcinoma in situ, unspecified: Secondary | ICD-10-CM

## 2024-03-19 NOTE — Patient Instructions (Addendum)
 Cryotherapy Aftercare  Wash gently with soap and water everyday.   Apply Vaseline and Band-Aid daily until healed.    Electrodesiccation and Curettage ("Scrape and Burn") Wound Care Instructions  Leave the original bandage on for 24 hours if possible.  If the bandage becomes soaked or soiled before that time, it is OK to remove it and examine the wound.  A small amount of post-operative bleeding is normal.  If excessive bleeding occurs, remove the bandage, place gauze over the site and apply continuous pressure (no peeking) over the area for 30 minutes. If this does not work, please call our clinic as soon as possible or page your doctor if it is after hours.   Once a day, cleanse the wound with soap and water. It is fine to shower. If a thick crust develops you may use a Q-tip dipped into dilute hydrogen peroxide (mix 1:1 with water) to dissolve it.  Hydrogen peroxide can slow the healing process, so use it only as needed.    After washing, apply petroleum jelly (Vaseline) or an antibiotic ointment if your doctor prescribed one for you, followed by a bandage.    For best healing, the wound should be covered with a layer of ointment at all times. If you are not able to keep the area covered with a bandage to hold the ointment in place, this may mean re-applying the ointment several times a day.  Continue this wound care until the wound has healed and is no longer open. It may take several weeks for the wound to heal and close.  Itching and mild discomfort is normal during the healing process.  If you have any discomfort, you can take Tylenol (acetaminophen) or ibuprofen as directed on the bottle. (Please do not take these if you have an allergy to them or cannot take them for another reason).  Some redness, tenderness and white or yellow material in the wound is normal healing.  If the area becomes very sore and red, or develops a thick yellow-green material (pus), it may be infected; please  notify us.    Wound healing continues for up to one year following surgery. It is not unusual to experience pain in the scar from time to time during the interval.  If the pain becomes severe or the scar thickens, you should notify the office.    A slight amount of redness in a scar is expected for the first six months.  After six months, the redness will fade and the scar will soften and fade.  The color difference becomes less noticeable with time.  If there are any problems, return for a post-op surgery check at your earliest convenience.  To improve the appearance of the scar, you can use silicone scar gel, cream, or sheets (such as Mederma or Serica) every night for up to one year. These are available over the counter (without a prescription).  Please call our office at [phone] for any questions or concerns.   Due to recent changes in healthcare laws, you may see results of your pathology and/or laboratory studies on MyChart before the doctors have had a chance to review them. We understand that in some cases there may be results that are confusing or concerning to you. Please understand that not all results are received at the same time and often the doctors may need to interpret multiple results in order to provide you with the best plan of care or course of treatment. Therefore, we ask that  you please give [place] 2 business days to thoroughly review all your results before contacting the office for clarification. Should we see a critical lab result, you will be contacted sooner.   If You Need Anything After Your Visit  If you have any questions or concerns for your doctor, please call our main line at [phone] and press option 4 to reach your doctor's medical assistant. If no one answers, please leave a voicemail as directed and we will return your call as soon as possible. Messages left after 4 pm will be answered the following business day.   You may also send [place] a message via  MyChart. We typically respond to MyChart messages within 1-2 business days.  For prescription refills, please ask your pharmacy to contact our office. Our fax number is [phone].  If you have an urgent issue when the clinic is closed that cannot wait until the next business day, you can page your doctor at the number below.    Please note that while we do our best to be available for urgent issues outside of office hours, we are not available 24/7.   If you have an urgent issue and are unable to reach [place], you may choose to seek medical care at your doctor's office, retail clinic, urgent care center, or emergency room.  If you have a medical emergency, please immediately call 911 or go to the emergency department.  Pager Numbers  - Dr. Gwen Pounds: [phone]  - Dr. Roseanne Reno: [phone]  - Dr. Katrinka Blazing: [phone]   In the event of inclement weather, please call our main line at [phone] for an update on the status of any delays or closures.  Dermatology Medication Tips: Please keep the boxes that topical medications come in in order to help keep track of the instructions about where and how to use these. Pharmacies typically print the medication instructions only on the boxes and not directly on the medication tubes.   If your medication is too expensive, please contact our office at [phone] option 4 or send [place] a message through MyChart.   We are unable to tell what your co-pay for medications will be in advance as this is different depending on your insurance coverage. However, we may be able to find a substitute medication at lower cost or fill out paperwork to get insurance to cover a needed medication.   If a prior authorization is required to get your medication covered by your insurance company, please allow [place] 1-2 business days to complete this process.  Drug prices often vary depending on where the prescription is filled and some pharmacies may offer cheaper  prices.  The website www.goodrx.com contains coupons for medications through different pharmacies. The prices here do not account for what the cost may be with help from insurance (it may be cheaper with your insurance), but the website can give you the price if you did not use any insurance.  - You can print the associated coupon and take it with your prescription to the pharmacy.  - You may also stop by our office during regular business hours and pick up a GoodRx coupon card.  - If you need your prescription sent electronically to a different pharmacy, notify our office through [facility] or by phone at [phone] option 4.     Si Usted Necesita Algo Despus de Su Visita  Tambin puede enviarnos un mensaje a travs de Clinical cytogeneticist. Por lo general respondemos a los Public relations account executive  transcurso de 1 a 2 das hbiles.  Para renovar recetas, por favor pida a su farmacia que se ponga en contacto con nuestra oficina. Franz Jacks de fax es [place] [phone].  Si tiene un asunto urgente cuando la clnica est cerrada y que no puede esperar hasta el siguiente da hbil, puede llamar/localizar a su doctor(a) al nmero que aparece a continuacin.   Por favor, tenga en cuenta que aunque hacemos todo lo posible para estar disponibles para asuntos urgentes fuera del horario de [place], no estamos disponibles las 24 horas del da, los 7 [address]  [address] de la [place].   Si tiene un problema urgente y no puede comunicarse con nosotros, puede optar por buscar atencin mdica  en el consultorio de su doctor(a), en una clnica privada, en un centro de atencin urgente o en una sala de emergencias.  Si tiene Engineer, drilling, por favor llame inmediatamente al 911 o vaya a la sala de emergencias.  Nmeros de bper  - Dr. Bary Likes: [phone]  - Dra. Annette Barters: [phone]  - Dr. Felipe Horton: [phone]   En caso de inclemencias del tiempo, por favor llame a Lajuan Pila principal al [phone]  para una actualizacin sobre el [place] de cualquier retraso o cierre.  Consejos para la medicacin en dermatologa: Por favor, guarde las cajas en las que vienen los medicamentos de uso tpico para ayudarle a seguir las instrucciones sobre dnde y cmo usarlos. Las farmacias generalmente imprimen las instrucciones del medicamento slo en las cajas y no directamente en los tubos del [place].   Si su medicamento es muy caro, por favor, pngase en contacto con Bettyjane Brunet llamando al [phone] y presione la opcin 4 o envenos un mensaje a travs de Clinical cytogeneticist.   No podemos decirle cul ser su copago por los medicamentos por adelantado ya que esto es diferente dependiendo de la cobertura de su seguro. Sin embargo, es posible que podamos encontrar un medicamento sustituto a Audiological scientist un formulario para que el seguro cubra el medicamento que se considera necesario.   Si se requiere una autorizacin previa para que su compaa de seguros [place] su medicamento, por favor permtanos de 1 a 2 das hbiles para completar este proceso.  Los precios de los medicamentos varan con frecuencia dependiendo del Environmental consultant de dnde se surte la receta y alguna farmacias pueden ofrecer precios ms baratos.  El sitio web www.goodrx.com tiene cupones para medicamentos de Health and safety inspector. Los precios aqu no tienen en cuenta lo que podra costar con la ayuda del seguro (puede ser ms barato con su seguro), pero el sitio web puede darle el precio si no utiliz Tourist information centre manager.  - Puede imprimir el cupn correspondiente y llevarlo con su receta a la farmacia.  - Tambin puede pasar por nuestra oficina durante el horario de atencin regular y Education officer, museum una tarjeta de cupones de GoodRx.  - Si necesita que su receta se enve electrnicamente a una farmacia diferente, informe a nuestra oficina a travs de MyChart de [place] o por telfono llamando al [phone] y presione la opcin 4.

## 2024-03-19 NOTE — Progress Notes (Signed)
 Follow-Up Visit   Subjective  Dakota Harper is a 78 y.o. male who presents for the following: Patient here for treatment SCCIS to [facility] at left posterior crown biopsy proven.   The patient has spots, moles and lesions to be evaluated, some may be new or changing and the patient may have concern these could be cancer. Check scaly spot on forehead.  Spot on back didn't clear with freezing last visit.   The following portions of the chart were reviewed this encounter and updated as appropriate: medications, allergies, medical history  Review of Systems:  No other skin or systemic complaints except as noted in HPI or Assessment and Plan.  Objective  Well appearing patient in no apparent distress; mood and affect are within normal limits.  A focused examination was performed of the following areas: Left posterior crown  Relevant exam findings are noted in the Assessment and Plan.  Left posterior crown Healing biopsy site  Left frontal scalp at hairline x1 Pink scaly macules Spinal left lower back x1 Stuck on waxy paps with erythema  Assessment & Plan   ACTINIC DAMAGE - chronic, secondary to cumulative UV radiation exposure/sun exposure over time - diffuse scaly erythematous macules with underlying dyspigmentation - Recommend daily broad spectrum sunscreen SPF 30+ to sun-exposed areas, reapply every 2 hours as needed.  - Recommend staying in the shade or wearing long sleeves, sun glasses (UVA+UVB protection) and wide brim hats (4-inch brim around the entire circumference of the hat). - Call for new or changing lesions.  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 Anesthesia:  the lesion was anesthetized in a standard fashion   Anesthetic:  1% lidocaine  w/ epinephrine  1-100,000 buffered w/ 8.4% NaHCO3 Curettage performed in three different directions: Yes   Electrodesiccation performed over the curetted area: Yes   Final wound size (cm):  2.1 Hemostasis achieved with:  pressure, aluminum chloride and electrodesiccation Outcome: patient tolerated procedure well with no complications   Post-procedure details: wound care instructions given   Additional details:  Mupirocin  ointment and Bandaid applied  EDC today  AK (ACTINIC KERATOSIS) Left frontal scalp at hairline x1 Actinic keratoses are precancerous spots that appear secondary to cumulative UV radiation exposure/sun exposure over time. They are chronic with expected duration over 1 year. A portion of actinic keratoses will progress to squamous cell carcinoma of the skin. It is not possible to reliably predict which spots will progress to skin cancer and so treatment is recommended to prevent development of skin cancer.  Recommend daily broad spectrum sunscreen SPF 30+ to sun-exposed areas, reapply every 2 hours as needed.  Recommend staying in the shade or wearing long sleeves, sun glasses (UVA+UVB protection) and wide brim hats (4-inch brim around the entire circumference of the hat). Call for new or changing lesions. Destruction of lesion - Left frontal scalp at hairline x1  Destruction method: cryotherapy   Informed consent: discussed and consent obtained   Lesion destroyed using liquid nitrogen: Yes   Region frozen until ice ball extended beyond lesion: Yes   Outcome: patient tolerated procedure well with no complications   Post-procedure details: wound care instructions given   INFLAMED SEBORRHEIC KERATOSIS Spinal left lower back x1 Symptomatic, irritating, patient would like  treated. Destruction of lesion - Spinal left lower back x1  Destruction method: cryotherapy   Informed consent: discussed and  consent obtained   Lesion destroyed using liquid nitrogen: Yes   Region frozen until ice ball extended beyond lesion: Yes   Outcome: patient tolerated procedure well with no complications   Post-procedure details: wound care instructions given    Return in about 6 months (around 09/19/2024) for w/ Dr. Annette Barters, recheck SCCIS site at scalp .  I, Jacquelynn Vera, CMA, am acting as scribe for Artemio Larry, MD .   Documentation: I have reviewed the above documentation for accuracy and completeness, and I agree with the above.  Artemio Larry, MD

## 2024-04-15 ENCOUNTER — Ambulatory Visit

## 2024-04-15 ENCOUNTER — Ambulatory Visit
Admission: RE | Admit: 2024-04-15 | Discharge: 2024-04-15 | Disposition: A | Discharge status: Home / Self Care | Source: Ambulatory Visit | Attending: Endocrinology | Admitting: Endocrinology

## 2024-04-15 DIAGNOSIS — D352 Benign neoplasm of pituitary gland: Secondary | ICD-10-CM | POA: Insufficient documentation

## 2024-04-15 MED ORDER — GADOBUTROL 1 MMOL/ML IV SOLN
9.0000 mL | Freq: Once | INTRAVENOUS | Status: AC | PRN
  Administered 2024-04-15: 9 mL via INTRAVENOUS

## 2024-06-02 ENCOUNTER — Encounter: Payer: Self-pay | Admitting: Dermatology

## 2024-06-02 ENCOUNTER — Ambulatory Visit: Admitting: Dermatology

## 2024-06-02 DIAGNOSIS — L2081 Atopic neurodermatitis: Secondary | ICD-10-CM | POA: Diagnosis not present

## 2024-06-02 DIAGNOSIS — L821 Other seborrheic keratosis: Secondary | ICD-10-CM

## 2024-06-02 DIAGNOSIS — Z79899 Other long term (current) drug therapy: Secondary | ICD-10-CM | POA: Diagnosis not present

## 2024-06-02 DIAGNOSIS — Z7189 Other specified counseling: Secondary | ICD-10-CM

## 2024-06-02 MED ORDER — TACROLIMUS 0.1 % EX OINT
TOPICAL_OINTMENT | Freq: Two times a day (BID) | CUTANEOUS | 0 refills | Status: AC
Start: 2024-06-02 — End: ?

## 2024-06-02 MED ORDER — NEMOLIZUMAB-ILTO 30 MG ~~LOC~~ AUIJ
60.0000 mg | AUTO-INJECTOR | Freq: Once | SUBCUTANEOUS | Status: AC
Start: 2024-06-02 — End: 2024-06-02
  Administered 2024-06-02: 60 mg via SUBCUTANEOUS

## 2024-06-02 MED ORDER — FLUOCINONIDE 0.05 % EX OINT
1.0000 | TOPICAL_OINTMENT | Freq: Two times a day (BID) | CUTANEOUS | 5 refills | Status: AC | PRN
Start: 2024-06-02 — End: ?

## 2024-06-02 MED ORDER — NEMOLIZUMAB-ILTO 30 MG ~~LOC~~ AUIJ
30.0000 mg | AUTO-INJECTOR | SUBCUTANEOUS | 4 refills | Status: DC
Start: 2024-06-02 — End: 2024-07-31

## 2024-06-02 NOTE — Progress Notes (Signed)
 Follow-Up Visit   Subjective  Dakota Harper is a 78 y.o. male who presents for the following: Itching from toes to neck, started years ago but comes and goes. Patient reports he was told he had eczema in there past and has used fluocinonide  in the past and helps short term only like an hour with itch.    The following portions of the chart were reviewed this encounter and updated as appropriate: medications, allergies, medical history  Review of Systems:  No other skin or systemic complaints except as noted in HPI or Assessment and Plan.  Objective  Well appearing patient in no apparent distress; mood and affect are within normal limits.  A focused examination was performed of the following areas: Legs, arms, abdomen, scalp  Relevant exam findings are noted in the Assessment and Plan.    Assessment & Plan   SEBORRHEIC KERATOSIS - Stuck-on, waxy, tan-brown papules and/or plaques at bilateral lower legs - Benign-appearing - Discussed benign etiology and prognosis. 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 Condition is bothersome/symptomatic for patient. Currently flared.   Atopic dermatitis (eczema) is a chronic, relapsing, pruritic condition that can significantly affect quality of life. It is often associated with allergic rhinitis and/or asthma and can require treatment with topical medications, phototherapy, or in severe cases biologic injectable medication (Dupixent; Adbry) or Oral JAK inhibitors.  Treatment Plan: Discussed starting Nemluvio . Can start with samples today and continue until patient approved by his insurance. Enrollment forms completed and faxed.   Initial, 60 mg (two 30-mg injections) subQ once, followed by 30 mg subQ every 4 weeks. After 16 weeks of treatment, for patients who achieve clear or  almost clear skin, 30 mg subQ every 8 weeks.  Recommend starting Nemluvio .   Nemluvio  60 mg (two 30-mg injections) JL injected into the right lower abdomen and patient self-injected into left lower abdomen. Patient tolerated both injections well.   NDC: 9700-3779-89 Lot: [id number] Exp: 08/2026  NDC: 9700-3779-84 Lot: [id number] Exp: 07/2026   Start tacrolimus  ointment, apply BID to aa, eczema. Patient advised to not pick up if too expensive and let us  know.  Refill fluocinonide  BID prn pruritus Reviewed risks of biologics including immunosuppression, infections, injection site reaction, and failure to improve condition. Goal is control of skin condition, not cure.  Some older biologics such as Humira and Enbrel may slightly increase risk of malignancy and may worsen congestive heart failure.  Taltz and Cosentyx may cause inflammatory bowel disease to flare. The use of biologics requires long term medication management, including periodic office visits and monitoring of blood work.   Recommend gentle skin care.   ATOPIC NEURODERMATITIS   Related Medications nemolizumab-ilto  (NEMLUVIO ) 30 MG SQ injection Inject 30 mg into the skin every 28 (twenty-eight) days. nemolizumab-ilto  (NEMLUVIO ) SQ injection 60 mg  tacrolimus  (PROTOPIC ) 0.1 % ointment Apply topically 2 (two) times daily. fluocinonide  ointment (LIDEX ) 0.05 % Apply 1 Application topically 2 (two) times daily as needed (pruritus). (apply to legs) LONG-TERM USE OF HIGH-RISK MEDICATION   COUNSELING AND COORDINATION OF CARE   MEDICATION MANAGEMENT   SEBORRHEIC KERATOSES    Return for 1 month w/ nurse injection, 3 months with Dr. Claudene atopic neurodermatitis f/u .  I, Jacquelynn V. Wilfred, CMA, am acting as scribe for Boneta Claudene, MD .   Documentation: I have reviewed the above  documentation for accuracy and completeness, and I agree with the above.  Boneta Sharps, MD

## 2024-06-02 NOTE — Patient Instructions (Addendum)
 Nemluvio : Initial, 60 mg (two 30-mg injections) subQ once, followed by 30 mg subQ every 4 weeks. After 16 weeks of treatment, for patients who achieve clear or almost clear skin, 30 mg subQ every 8 weeks.  Start tacrolimus  ointment, apply twice a day to affected areas for eczema. Do not pick up if too expensive.   Dakota Harper (will send prescription for Nemluvio  injections)  Your prescription has been sent to [facility].  [facility] will call you to obtain additional  information needed to fill your prescription such as:  Additional insurance information Payment for any out-of-pocket expense (may require credit card) Address for drug delivery shipment  A timely response to the pharmacy may help you obtain your medication more quickly. Please call Dakota Harper at [phone].    Due to recent changes in healthcare laws, you may see results of your pathology and/or laboratory studies on MyChart before the doctors have had a chance to review them. We understand that in some cases there may be results that are confusing or concerning to you. Please understand that not all results are received at the same time and often the doctors may need to interpret multiple results in order to provide you with the best plan of care or course of treatment. Therefore, we ask that you please give us  2 business days to thoroughly review all your results before contacting the office for clarification. Should we see a critical lab result, you will be contacted sooner.   If You Need Anything After Your Visit  If you have any questions or concerns for your doctor, please call our main line at [phone] and press option 4 to reach your doctor's medical assistant. If no one answers, please leave a voicemail as directed and we will return your call as soon as possible. Messages left after 4 pm will be answered the following business day.   You may also send us  a message  via MyChart. We typically respond to MyChart messages within 1-2 business days.  For prescription refills, please ask your pharmacy to contact our office. Our fax number is [phone].  If you have an urgent issue when the clinic is closed that cannot wait until the next business day, you can page your doctor at the number below.    Please note that while we do our best to be available for urgent issues outside of office hours, we are not available 24/7.   If you have an urgent issue and are unable to reach us , you may choose to seek medical care at your doctor's office, retail clinic, urgent care center, or emergency room.  If you have a medical emergency, please immediately call 911 or go to the emergency department.  Pager Numbers  - Dr. Hester: [phone]  - Dr. Jackquline: [phone]  - Dr. Claudene: [phone]   In the event of inclement weather, please call our main line at [phone] for an update on the status of any delays or closures.  Dermatology Medication Tips: Please keep the boxes that topical medications come in in order to help keep track of the instructions about where and how to use these. Pharmacies typically print the medication instructions only on the boxes and not directly on the medication tubes.   If your medication is too expensive, please contact our office at [phone] option 4 or send us  a message through MyChart.   We are unable to tell what your co-pay for medications will be in advance  as this is different depending on your insurance coverage. However, we may be able to find a substitute medication at lower cost or fill out paperwork to get insurance to cover a needed medication.   If a prior authorization is required to get your medication covered by your insurance company, please allow us  1-2 business days to complete this process.  Drug prices often vary depending on where the prescription is filled and some pharmacies may offer cheaper  prices.  The website www.goodrx.com contains coupons for medications through different pharmacies. The prices here do not account for what the cost may be with help from insurance (it may be cheaper with your insurance), but the website can give you the price if you did not use any insurance.  - You can print the associated coupon and take it with your prescription to the pharmacy.  - You may also stop by our office during regular business hours and pick up a GoodRx coupon card.  - If you need your prescription sent electronically to a different pharmacy, notify our office through [facility] or by phone at [phone] option 4.     Si Usted Necesita Algo Despus de Su Visita  Tambin puede enviarnos un mensaje a travs de Clinical cytogeneticist. Por lo general respondemos a los mensajes de MyChart en el transcurso de 1 a 2 das hbiles.  Para renovar recetas, por favor pida a su farmacia que se ponga en contacto con nuestra oficina. Randi lakes de fax es [place] [phone].  Si tiene un asunto urgente cuando la clnica est cerrada y que no puede esperar hasta el siguiente da hbil, puede llamar/localizar a su doctor(a) al nmero que aparece a continuacin.   Por favor, tenga en cuenta que aunque hacemos todo lo posible para estar disponibles para asuntos urgentes fuera del horario de [place], no estamos disponibles las 24 horas del da, los 7 [address]  [address] de la [place].   Si tiene un problema urgente y no puede comunicarse con nosotros, puede optar por buscar atencin mdica  en el consultorio de su doctor(a), en una clnica privada, en un centro de atencin urgente o en una sala de emergencias.  Si tiene Engineer, drilling, por favor llame inmediatamente al 911 o vaya a la sala de emergencias.  Nmeros de bper  - Dr. Hester: [phone]  - Dra. Jackquline: [phone]  - Dr. Claudene: [phone]   En caso de inclemencias del tiempo, por favor llame a landry capes principal al [phone]  para una actualizacin sobre el [place] de cualquier retraso o cierre.  Consejos para la medicacin en dermatologa: Por favor, guarde las cajas en las que vienen los medicamentos de uso tpico para ayudarle a seguir las instrucciones sobre dnde y cmo usarlos. Las farmacias generalmente imprimen las instrucciones del medicamento slo en las cajas y no directamente en los tubos del [place].   Si su medicamento es muy caro, por favor, pngase en contacto con landry rieger llamando al [phone] y presione la opcin 4 o envenos un mensaje a travs de Clinical cytogeneticist.   No podemos decirle cul ser su copago por los medicamentos por adelantado ya que esto es diferente dependiendo de la cobertura de su seguro. Sin embargo, es posible que podamos encontrar un medicamento sustituto a Audiological scientist un formulario para que el seguro cubra el medicamento que se considera necesario.   Si se requiere una autorizacin previa para que su compaa de seguros malta su medicamento, por favor permtanos de 1 a 2  das hbiles para completar este proceso.  Los precios de los medicamentos varan con frecuencia dependiendo del Environmental consultant de dnde se surte la receta y alguna farmacias pueden ofrecer precios ms baratos.  El sitio web www.goodrx.com tiene cupones para medicamentos de Health and safety inspector. Los precios aqu no tienen en cuenta lo que podra costar con la ayuda del seguro (puede ser ms barato con su seguro), pero el sitio web puede darle el precio si no utiliz Tourist information centre manager.  - Puede imprimir el cupn correspondiente y llevarlo con su receta a la farmacia.  - Tambin puede pasar por nuestra oficina durante el horario de atencin regular y Education officer, museum una tarjeta de cupones de GoodRx.  - Si necesita que su receta se enve electrnicamente a una farmacia diferente, informe a nuestra oficina a travs de MyChart de  o por telfono llamando al [phone] y presione la opcin 4.

## 2024-06-11 ENCOUNTER — Telehealth: Payer: Self-pay

## 2024-06-11 NOTE — Telephone Encounter (Signed)
 Fax from [place] states that insurance is denying this request for Nemluvio  due to the patient must try and fail 3 of the covered drugs first. Covered drugs include: Betamethasone , Clobetasol, Cordran tape, Dupixent, Ebglyss, Fluticasone , Halobetasol, Mometasone , Pimecrolimus, Rinvoq, or Tacrolimus . Please advise.

## 2024-06-16 MED ORDER — DUPIXENT 300 MG/2ML ~~LOC~~ SOAJ: 300.0000 mg | SUBCUTANEOUS | 5 refills | Status: AC

## 2024-06-16 MED ORDER — DUPIXENT 300 MG/2ML ~~LOC~~ SOAJ: 600.0000 mg | Freq: Once | SUBCUTANEOUS | 0 refills | Status: AC

## 2024-06-16 NOTE — Addendum Note (Signed)
 Addended by: TERESA PALMA R on: 06/16/2024 04:39 PM   Modules accepted: Orders

## 2024-06-16 NOTE — Telephone Encounter (Signed)
 Dupixent  prescription sent into [place]. Will follow up with the prescription and advise patient of change. aw

## 2024-06-23 ENCOUNTER — Telehealth: Payer: Self-pay

## 2024-06-23 NOTE — Telephone Encounter (Signed)
 Called patient and advised him that medication switch from Nemluvio  to Dupixent  due to insurance coverage. Patient has already spoke to [employer]/Dupixent  [employer] and medication has been approved and patient states shipment to be received tomorrow. Patient states he is going to self inject and will keep October follow up appt. aw

## 2024-07-03 ENCOUNTER — Ambulatory Visit

## 2024-07-09 ENCOUNTER — Other Ambulatory Visit: Payer: Self-pay

## 2024-07-09 ENCOUNTER — Emergency Department

## 2024-07-09 ENCOUNTER — Emergency Department
Admission: EM | Admit: 2024-07-09 | Discharge: 2024-07-10 | Disposition: A | Discharge status: Home / Self Care | Attending: Emergency Medicine | Admitting: Emergency Medicine

## 2024-07-09 DIAGNOSIS — R42 Dizziness and giddiness: Secondary | ICD-10-CM | POA: Diagnosis not present

## 2024-07-09 DIAGNOSIS — N189 Chronic kidney disease, unspecified: Secondary | ICD-10-CM | POA: Insufficient documentation

## 2024-07-09 DIAGNOSIS — J029 Acute pharyngitis, unspecified: Secondary | ICD-10-CM | POA: Insufficient documentation

## 2024-07-09 DIAGNOSIS — J449 Chronic obstructive pulmonary disease, unspecified: Secondary | ICD-10-CM | POA: Insufficient documentation

## 2024-07-09 DIAGNOSIS — I129 Hypertensive chronic kidney disease with stage 1 through stage 4 chronic kidney disease, or unspecified chronic kidney disease: Secondary | ICD-10-CM | POA: Insufficient documentation

## 2024-07-09 DIAGNOSIS — R059 Cough, unspecified: Secondary | ICD-10-CM | POA: Diagnosis not present

## 2024-07-09 DIAGNOSIS — R531 Weakness: Secondary | ICD-10-CM | POA: Insufficient documentation

## 2024-07-09 LAB — RESP PANEL BY RT-PCR (RSV, FLU A&B, COVID)  RVPGX2
Influenza A by PCR: NEGATIVE
Influenza B by PCR: NEGATIVE
Resp Syncytial Virus by PCR: NEGATIVE
SARS Coronavirus 2 by RT PCR: NEGATIVE

## 2024-07-09 LAB — COMPREHENSIVE METABOLIC PANEL WITH GFR
ALT: 14 U/L (ref 0–44)
AST: 21 U/L (ref 15–41)
Albumin: 3.6 g/dL (ref 3.5–5.0)
Alkaline Phosphatase: 58 U/L (ref 38–126)
Anion gap: 11 (ref 5–15)
BUN: 16 mg/dL (ref 8–23)
CO2: 25 mmol/L (ref 22–32)
Calcium: 9.5 mg/dL (ref 8.9–10.3)
Chloride: 103 mmol/L (ref 98–111)
Creatinine, Ser: 1.47 mg/dL — ABNORMAL HIGH (ref 0.61–1.24)
GFR, Estimated: 49 mL/min — ABNORMAL LOW (ref >60)
Glucose, Bld: 73 mg/dL (ref 70–99)
Potassium: 4.2 mmol/L (ref 3.5–5.1)
Sodium: 139 mmol/L (ref 135–145)
Total Bilirubin: 1 mg/dL (ref 0.0–1.2)
Total Protein: 7 g/dL (ref 6.5–8.1)

## 2024-07-09 LAB — CBC
HCT: 43.5 % (ref 39.0–52.0)
Hemoglobin: 14.4 g/dL (ref 13.0–17.0)
MCH: 30.4 pg (ref 26.0–34.0)
MCHC: 33.1 g/dL (ref 30.0–36.0)
MCV: 91.8 fL (ref 80.0–100.0)
Platelets: 245 K/uL (ref 150–400)
RBC: 4.74 MIL/uL (ref 4.22–5.81)
RDW: 13.2 % (ref 11.5–15.5)
WBC: 6.7 K/uL (ref 4.0–10.5)
nRBC: 0 % (ref 0.0–0.2)

## 2024-07-09 LAB — CBG MONITORING, ED: Glucose-Capillary: 71 mg/dL (ref 70–99)

## 2024-07-09 NOTE — ED Notes (Signed)
Patient provided ice water.

## 2024-07-09 NOTE — ED Triage Notes (Signed)
 Patient states he had his third dose of Dupixent  today then developed weakness and dizzy.

## 2024-07-09 NOTE — ED Notes (Signed)
 Pt unable to provide urine sample at this time

## 2024-07-09 NOTE — ED Provider Notes (Signed)
 [facility] Provider Note   Event Date/Time   First MD Initiated Contact with Patient 07/09/24 2259     (approximate) History  Weakness  HPI Dakota Harper is a 78 y.o. male with extensive past medical history including COPD, gout, hypertension, hypercholesterolemia, and CKD who presents complaining of lightheadedness and difficulty with ambulation that began this afternoon after receiving a Dupixent  injection today.  Patient states that he has had multiple Derrickson ingestion in the past with no side effects.  Patient also states that he has had a sore throat and mild nonproductive cough over the last 2 days with associated fatigue. ROS: Patient currently denies any vision changes, tinnitus, difficulty speaking, facial droop, chest pain, shortness of breath, abdominal pain, nausea/vomiting/diarrhea, dysuria, or numbness/paresthesias in any extremity   Physical Exam  Triage Vital Signs: ED Triage Vitals  Encounter Vitals Group     BP 07/09/24 1827 120/67     Girls Systolic BP Percentile --      Girls Diastolic BP Percentile --      Boys Systolic BP Percentile --      Boys Diastolic BP Percentile --      Pulse Rate 07/09/24 1827 85     Resp 07/09/24 1827 18     Temp 07/09/24 1827 97.7 F (36.5 C)     Temp Source 07/09/24 1827 Oral     SpO2 07/09/24 1827 96 %     Weight --      Height --      Head Circumference --      Peak Flow --      Pain Score 07/09/24 1829 0     Pain Loc --      Pain Education --      Exclude from Growth Chart --    Most recent vital signs: Vitals:   07/10/24 0130 07/10/24 0230  BP: (!) 182/77 (!) 154/68  Pulse: 82 76  Resp:  18  Temp:  98.6 F (37 C)  SpO2: 100% 100%   General: Awake, oriented x4. CV:  Good peripheral perfusion. Resp:  Normal effort. Abd:  No distention. Other:  Elderly well-developed, well-nourished Caucasian male resting comfortably in no acute distress.  Erythematous posterior oropharynx ED  Results / Procedures / Treatments  Labs (all labs ordered are listed, but only abnormal results are displayed) Labs Reviewed  COMPREHENSIVE METABOLIC PANEL WITH GFR - Abnormal; Notable for the following components:      Result Value   Creatinine, Ser 1.47 (*)    GFR, Estimated 49 (*)    All other components within normal limits  URINALYSIS, ROUTINE W REFLEX MICROSCOPIC - Abnormal; Notable for the following components:   Color, Urine AMBER (*)    APPearance HAZY (*)    Hgb urine dipstick MODERATE (*)    Ketones, ur 5 (*)    Protein, ur 100 (*)    All other components within normal limits  RESP PANEL BY RT-PCR (RSV, FLU A&B, COVID)  RVPGX2  GROUP A STREP BY PCR  CBC  CBG MONITORING, ED  TROPONIN I (HIGH SENSITIVITY)  TROPONIN I (HIGH SENSITIVITY)   EKG ED ECG REPORT I, Artist MARLA Kerns, the attending physician, personally viewed and interpreted this ECG. Date: 07/09/2024 EKG Time: 1836 Rate: 82 Rhythm: normal sinus rhythm QRS Axis: normal Intervals: Right bundle branch block ST/T Wave abnormalities: normal Narrative Interpretation: RBBB no evidence of acute ischemia RADIOLOGY ED MD interpretation: One-view portable chest x-ray interpreted by me shows no  evidence of acute abnormalities including no pneumonia, pneumothorax, or widened mediastinum - All radiology independently interpreted and agree with radiology assessment Official radiology report(s): DG Chest Port 1 View Result Date: 07/09/2024 CLINICAL DATA:  Weakness, COPD EXAM: PORTABLE CHEST 1 VIEW COMPARISON:  None Available. FINDINGS: The heart size and mediastinal contours are within normal limits. Both lungs are clear. The visualized skeletal structures are unremarkable. IMPRESSION: No active disease. Electronically Signed   By: Dorethia Molt M.D.   On: 07/09/2024 23:57   PROCEDURES: Critical Care performed: No Procedures MEDICATIONS ORDERED IN ED: Medications  lidocaine  (XYLOCAINE ) 2 % jelly 1 Application (1  Application Urethral Patient Refused/Not Given 07/10/24 0326)  acetaminophen  (TYLENOL ) tablet 1,000 mg (has no administration in time range)   IMPRESSION / MDM / ASSESSMENT AND PLAN / ED COURSE  I reviewed the triage vital signs and the nursing notes.                             The patient is on the cardiac monitor to evaluate for evidence of arrhythmia and/or significant heart rate changes. Patient's presentation is most consistent with acute presentation with potential threat to life or bodily function. Patient is a 78 year old male with the above-stated past medical history presents for generalized weakness and difficulty with ambulation that began today after an injection of Dupixent .   DDx: Upper respiratory infection, pneumonia, COPD exacerbation, UTI, viral illness Plan: CMP-creatinine 1.47, GFR 49 CBC-WNL RVP negative UA negative  Dispo: Discharge home Clinical Course as of 07/10/24 0519  Thu Jul 10, 2024  0518 Patient reassessed and informed of test results.  Patient continues to complain of sore throat and requests Tylenol .  At this time patient only shows signs of mild pharyngitis however this may be the start of an upper respiratory infection and this is the cause for patient's generalized weakness.  Given the patient has just had an injection of Dupixent , he was encouraged to follow-up with his primary care physician if the symptomsTo worsen.  Patient agrees with plan for discharge home and PCP follow-up as needed [EB]    Clinical Course User Index [EB] Jossie, Artist POUR, MD   FINAL CLINICAL IMPRESSION(S) / ED DIAGNOSES   Final diagnoses:  Pharyngitis, unspecified etiology  Generalized weakness   Rx / DC Orders   ED Discharge Orders     None      Note:  This document was prepared using Dragon voice recognition software and may include unintentional dictation errors.   Trinitie Mcgirr K, MD 07/10/24 [phone]

## 2024-07-09 NOTE — ED Triage Notes (Signed)
 Arrived by [facility] from home. This afternoon around noon became Dizzy and unsteady.   History hypertension and COPD  EMS vitals: 134/87 b/p 83HR 96% RA 71CBG

## 2024-07-10 DIAGNOSIS — J029 Acute pharyngitis, unspecified: Secondary | ICD-10-CM | POA: Diagnosis not present

## 2024-07-10 LAB — URINALYSIS, ROUTINE W REFLEX MICROSCOPIC
Bacteria, UA: NONE SEEN
Bilirubin Urine: NEGATIVE
Glucose, UA: NEGATIVE mg/dL
Ketones, ur: 5 mg/dL — AB
Leukocytes,Ua: NEGATIVE
Nitrite: NEGATIVE
Protein, ur: 100 mg/dL — AB
RBC / HPF: >50 RBC/hpf (ref 0–5)
Specific Gravity, Urine: 1.024 (ref 1.005–1.030)
Squamous Epithelial / HPF: 0 /HPF (ref 0–5)
pH: 5 (ref 5.0–8.0)

## 2024-07-10 LAB — TROPONIN I (HIGH SENSITIVITY)
Troponin I (High Sensitivity): 7 ng/L (ref <18)
Troponin I (High Sensitivity): 7 ng/L (ref <18)

## 2024-07-10 LAB — GROUP A STREP BY PCR: Group A Strep by PCR: NOT DETECTED

## 2024-07-10 MED ORDER — LIDOCAINE HCL URETHRAL/MUCOSAL 2 % EX GEL: 1.0000 | Freq: Once | CUTANEOUS | Status: DC

## 2024-07-10 MED ORDER — ACETAMINOPHEN 500 MG PO TABS
1000.0000 mg | ORAL_TABLET | Freq: Once | ORAL | Status: AC
  Administered 2024-07-10: 1000 mg via ORAL
  Filled 2024-07-10: qty 2

## 2024-07-10 NOTE — ED Notes (Addendum)
 This RN with Donny RN attempted in/out cath. Pt requested we stop. MD notified. Bladder scan showed 198 mL. Pt also refused Urojet prefilled syringe of lidocaine  and coude cath. MD notified. MD allowing pt to drink fluids and attempt to urinate on his own.

## 2024-07-10 NOTE — ED Notes (Signed)
 This RN provided minimal assistance for pt to go from bed to toilet and from toilet to bed. Pt in bed, bed is in lowest, locked position. Call bell within reach.

## 2024-07-31 ENCOUNTER — Emergency Department

## 2024-07-31 ENCOUNTER — Encounter: Payer: Self-pay | Admitting: Internal Medicine

## 2024-07-31 ENCOUNTER — Other Ambulatory Visit: Payer: Self-pay

## 2024-07-31 ENCOUNTER — Observation Stay
Admission: EM | Admit: 2024-07-31 | Discharge: 2024-08-02 | Disposition: A | Discharge status: Home / Self Care | Attending: Internal Medicine | Admitting: Internal Medicine

## 2024-07-31 DIAGNOSIS — N1832 Chronic kidney disease, stage 3b: Secondary | ICD-10-CM | POA: Diagnosis present

## 2024-07-31 DIAGNOSIS — G4733 Obstructive sleep apnea (adult) (pediatric): Secondary | ICD-10-CM | POA: Diagnosis present

## 2024-07-31 DIAGNOSIS — I10 Essential (primary) hypertension: Secondary | ICD-10-CM | POA: Diagnosis present

## 2024-07-31 DIAGNOSIS — G894 Chronic pain syndrome: Secondary | ICD-10-CM | POA: Diagnosis not present

## 2024-07-31 DIAGNOSIS — I129 Hypertensive chronic kidney disease with stage 1 through stage 4 chronic kidney disease, or unspecified chronic kidney disease: Secondary | ICD-10-CM | POA: Insufficient documentation

## 2024-07-31 DIAGNOSIS — R55 Syncope and collapse: Secondary | ICD-10-CM | POA: Diagnosis not present

## 2024-07-31 DIAGNOSIS — N400 Enlarged prostate without lower urinary tract symptoms: Secondary | ICD-10-CM | POA: Diagnosis present

## 2024-07-31 DIAGNOSIS — J4489 Other specified chronic obstructive pulmonary disease: Secondary | ICD-10-CM | POA: Diagnosis not present

## 2024-07-31 DIAGNOSIS — Z6835 Body mass index (BMI) 35.0-35.9, adult: Secondary | ICD-10-CM | POA: Diagnosis not present

## 2024-07-31 DIAGNOSIS — Z79899 Other long term (current) drug therapy: Secondary | ICD-10-CM | POA: Insufficient documentation

## 2024-07-31 DIAGNOSIS — Z7982 Long term (current) use of aspirin: Secondary | ICD-10-CM | POA: Insufficient documentation

## 2024-07-31 DIAGNOSIS — N401 Enlarged prostate with lower urinary tract symptoms: Secondary | ICD-10-CM

## 2024-07-31 DIAGNOSIS — Z1152 Encounter for screening for COVID-19: Secondary | ICD-10-CM | POA: Insufficient documentation

## 2024-07-31 DIAGNOSIS — D649 Anemia, unspecified: Secondary | ICD-10-CM | POA: Diagnosis not present

## 2024-07-31 DIAGNOSIS — M109 Gout, unspecified: Secondary | ICD-10-CM | POA: Diagnosis not present

## 2024-07-31 DIAGNOSIS — E669 Obesity, unspecified: Secondary | ICD-10-CM | POA: Diagnosis not present

## 2024-07-31 DIAGNOSIS — R251 Tremor, unspecified: Secondary | ICD-10-CM | POA: Diagnosis not present

## 2024-07-31 DIAGNOSIS — Z79890 Hormone replacement therapy (postmenopausal): Secondary | ICD-10-CM | POA: Insufficient documentation

## 2024-07-31 DIAGNOSIS — R42 Dizziness and giddiness: Secondary | ICD-10-CM | POA: Diagnosis present

## 2024-07-31 DIAGNOSIS — E039 Hypothyroidism, unspecified: Secondary | ICD-10-CM | POA: Diagnosis present

## 2024-07-31 DIAGNOSIS — Z9152 Personal history of nonsuicidal self-harm: Secondary | ICD-10-CM | POA: Insufficient documentation

## 2024-07-31 DIAGNOSIS — F339 Major depressive disorder, recurrent, unspecified: Secondary | ICD-10-CM | POA: Diagnosis present

## 2024-07-31 DIAGNOSIS — E7800 Pure hypercholesterolemia, unspecified: Secondary | ICD-10-CM | POA: Diagnosis not present

## 2024-07-31 DIAGNOSIS — W01198A Fall on same level from slipping, tripping and stumbling with subsequent striking against other object, initial encounter: Secondary | ICD-10-CM | POA: Diagnosis not present

## 2024-07-31 DIAGNOSIS — Z87891 Personal history of nicotine dependence: Secondary | ICD-10-CM | POA: Diagnosis not present

## 2024-07-31 DIAGNOSIS — S060X0A Concussion without loss of consciousness, initial encounter: Secondary | ICD-10-CM | POA: Insufficient documentation

## 2024-07-31 DIAGNOSIS — D352 Benign neoplasm of pituitary gland: Secondary | ICD-10-CM | POA: Diagnosis not present

## 2024-07-31 DIAGNOSIS — R338 Other retention of urine: Secondary | ICD-10-CM

## 2024-07-31 DIAGNOSIS — R0781 Pleurodynia: Secondary | ICD-10-CM | POA: Diagnosis present

## 2024-07-31 DIAGNOSIS — I951 Orthostatic hypotension: Secondary | ICD-10-CM | POA: Diagnosis present

## 2024-07-31 DIAGNOSIS — R0602 Shortness of breath: Secondary | ICD-10-CM | POA: Diagnosis present

## 2024-07-31 DIAGNOSIS — S0990XA Unspecified injury of head, initial encounter: Secondary | ICD-10-CM

## 2024-07-31 LAB — CBC
HCT: 39.9 % (ref 39.0–52.0)
Hemoglobin: 13.5 g/dL (ref 13.0–17.0)
MCH: 31.1 pg (ref 26.0–34.0)
MCHC: 33.8 g/dL (ref 30.0–36.0)
MCV: 91.9 fL (ref 80.0–100.0)
Platelets: 243 K/uL (ref 150–400)
RBC: 4.34 MIL/uL (ref 4.22–5.81)
RDW: 13.6 % (ref 11.5–15.5)
WBC: 7.4 K/uL (ref 4.0–10.5)
nRBC: 0 % (ref 0.0–0.2)

## 2024-07-31 LAB — TROPONIN I (HIGH SENSITIVITY)
Troponin I (High Sensitivity): 5 ng/L (ref <18)
Troponin I (High Sensitivity): 4 ng/L (ref <18)

## 2024-07-31 LAB — URINALYSIS, ROUTINE W REFLEX MICROSCOPIC
Bacteria, UA: NONE SEEN
Bilirubin Urine: NEGATIVE
Glucose, UA: NEGATIVE mg/dL
Hgb urine dipstick: NEGATIVE
Ketones, ur: NEGATIVE mg/dL
Nitrite: NEGATIVE
Protein, ur: NEGATIVE mg/dL
Specific Gravity, Urine: 1.012 (ref 1.005–1.030)
pH: 6 (ref 5.0–8.0)

## 2024-07-31 LAB — COMPREHENSIVE METABOLIC PANEL WITH GFR
ALT: 12 U/L (ref 0–44)
AST: 25 U/L (ref 15–41)
Albumin: 3.2 g/dL — ABNORMAL LOW (ref 3.5–5.0)
Alkaline Phosphatase: 43 U/L (ref 38–126)
Anion gap: 9 (ref 5–15)
BUN: 20 mg/dL (ref 8–23)
CO2: 26 mmol/L (ref 22–32)
Calcium: 8.8 mg/dL — ABNORMAL LOW (ref 8.9–10.3)
Chloride: 105 mmol/L (ref 98–111)
Creatinine, Ser: 1.25 mg/dL — ABNORMAL HIGH (ref 0.61–1.24)
GFR, Estimated: 59 mL/min — ABNORMAL LOW (ref >60)
Glucose, Bld: 93 mg/dL (ref 70–99)
Potassium: 3.9 mmol/L (ref 3.5–5.1)
Sodium: 140 mmol/L (ref 135–145)
Total Bilirubin: 0.6 mg/dL (ref 0.0–1.2)
Total Protein: 5.7 g/dL — ABNORMAL LOW (ref 6.5–8.1)

## 2024-07-31 LAB — D-DIMER, QUANTITATIVE: D-Dimer, Quant: 0.38 ug{FEU}/mL (ref 0.00–0.50)

## 2024-07-31 LAB — RESP PANEL BY RT-PCR (RSV, FLU A&B, COVID)  RVPGX2
Influenza A by PCR: NEGATIVE
Influenza B by PCR: NEGATIVE
Resp Syncytial Virus by PCR: NEGATIVE
SARS Coronavirus 2 by RT PCR: NEGATIVE

## 2024-07-31 LAB — BRAIN NATRIURETIC PEPTIDE: B Natriuretic Peptide: 101.1 pg/mL — ABNORMAL HIGH (ref 0.0–100.0)

## 2024-07-31 MED ORDER — FINASTERIDE 5 MG PO TABS
5.0000 mg | ORAL_TABLET | Freq: Every day | ORAL | Status: DC
  Administered 2024-08-01 – 2024-08-02 (×2): 5 mg via ORAL
  Filled 2024-07-31 (×2): qty 1

## 2024-07-31 MED ORDER — SODIUM CHLORIDE 0.9% FLUSH
3.0000 mL | Freq: Two times a day (BID) | INTRAVENOUS | Status: DC
  Administered 2024-07-31 – 2024-08-02 (×4): 3 mL via INTRAVENOUS

## 2024-07-31 MED ORDER — FLUDROCORTISONE ACETATE 0.1 MG PO TABS
100.0000 ug | ORAL_TABLET | Freq: Every day | ORAL | Status: DC
  Administered 2024-08-01 – 2024-08-02 (×2): 100 ug via ORAL
  Filled 2024-07-31 (×2): qty 1

## 2024-07-31 MED ORDER — POLYETHYLENE GLYCOL 3350 17 G PO PACK
17.0000 g | PACK | Freq: Every day | ORAL | Status: DC | PRN
  Administered 2024-08-02: 17 g via ORAL
  Filled 2024-07-31: qty 1

## 2024-07-31 MED ORDER — ATORVASTATIN CALCIUM 20 MG PO TABS
10.0000 mg | ORAL_TABLET | Freq: Every evening | ORAL | Status: DC
  Administered 2024-07-31 – 2024-08-01 (×2): 10 mg via ORAL
  Filled 2024-07-31 (×2): qty 1

## 2024-07-31 MED ORDER — MONTELUKAST SODIUM 10 MG PO TABS
5.0000 mg | ORAL_TABLET | Freq: Every day | ORAL | Status: DC
  Administered 2024-07-31 – 2024-08-01 (×2): 5 mg via ORAL
  Filled 2024-07-31 (×2): qty 1

## 2024-07-31 MED ORDER — DIVALPROEX SODIUM 250 MG PO DR TAB
250.0000 mg | DELAYED_RELEASE_TABLET | Freq: Two times a day (BID) | ORAL | Status: DC
Start: 2024-07-31 — End: 2024-08-02
  Administered 2024-07-31 – 2024-08-02 (×4): 250 mg via ORAL
  Filled 2024-07-31 (×4): qty 1

## 2024-07-31 MED ORDER — FLUTICASONE FUROATE-VILANTEROL 200-25 MCG/ACT IN AEPB
1.0000 | INHALATION_SPRAY | Freq: Every day | RESPIRATORY_TRACT | Status: DC
  Administered 2024-08-01 – 2024-08-02 (×2): 1 via RESPIRATORY_TRACT
  Filled 2024-07-31: qty 28

## 2024-07-31 MED ORDER — PANTOPRAZOLE SODIUM 40 MG PO TBEC
40.0000 mg | DELAYED_RELEASE_TABLET | Freq: Every day | ORAL | Status: DC
  Administered 2024-08-01 – 2024-08-02 (×2): 40 mg via ORAL
  Filled 2024-07-31 (×2): qty 1

## 2024-07-31 MED ORDER — FENOFIBRATE 54 MG PO TABS
54.0000 mg | ORAL_TABLET | Freq: Every day | ORAL | Status: DC
  Administered 2024-08-01 – 2024-08-02 (×2): 54 mg via ORAL
  Filled 2024-07-31 (×2): qty 1

## 2024-07-31 MED ORDER — ENOXAPARIN SODIUM 60 MG/0.6ML IJ SOSY
0.5000 mg/kg | PREFILLED_SYRINGE | INTRAMUSCULAR | Status: DC
  Administered 2024-07-31 – 2024-08-01 (×2): 47.5 mg via SUBCUTANEOUS
  Filled 2024-07-31 (×2): qty 0.6

## 2024-07-31 MED ORDER — ENOXAPARIN SODIUM 40 MG/0.4ML IJ SOSY: 40.0000 mg | PREFILLED_SYRINGE | INTRAMUSCULAR | Status: DC

## 2024-07-31 MED ORDER — ALBUTEROL SULFATE HFA 108 (90 BASE) MCG/ACT IN AERS: 1.0000 | INHALATION_SPRAY | Freq: Three times a day (TID) | RESPIRATORY_TRACT | Status: DC | PRN

## 2024-07-31 MED ORDER — CYCLOSPORINE 0.05 % OP EMUL: 1.0000 [drp] | Freq: Two times a day (BID) | OPHTHALMIC | Status: DC | PRN

## 2024-07-31 MED ORDER — MONTELUKAST SODIUM 5 MG PO CHEW
5.0000 mg | CHEWABLE_TABLET | Freq: Every day | ORAL | Status: DC
Start: 2024-07-31 — End: 2024-07-31
  Filled 2024-07-31: qty 1

## 2024-07-31 MED ORDER — FLEET ENEMA RE ENEM: 1.0000 | ENEMA | Freq: Once | RECTAL | Status: DC | PRN

## 2024-07-31 MED ORDER — SORBITOL 70 % SOLN: 30.0000 mL | Freq: Every day | Status: DC | PRN

## 2024-07-31 MED ORDER — TAMSULOSIN HCL 0.4 MG PO CAPS
0.4000 mg | ORAL_CAPSULE | Freq: Every day | ORAL | Status: DC
  Administered 2024-07-31 – 2024-08-01 (×2): 0.4 mg via ORAL
  Filled 2024-07-31 (×2): qty 1

## 2024-07-31 MED ORDER — LEVOTHYROXINE SODIUM 50 MCG PO TABS
50.0000 ug | ORAL_TABLET | Freq: Every day | ORAL | Status: DC
  Administered 2024-08-01 – 2024-08-02 (×2): 50 ug via ORAL
  Filled 2024-07-31 (×2): qty 1

## 2024-07-31 MED ORDER — NORTRIPTYLINE HCL 25 MG PO CAPS
150.0000 mg | ORAL_CAPSULE | Freq: Every day | ORAL | Status: DC
  Administered 2024-08-01 – 2024-08-02 (×2): 150 mg via ORAL
  Filled 2024-07-31 (×2): qty 6

## 2024-07-31 MED ORDER — ACETAMINOPHEN 650 MG RE SUPP: 650.0000 mg | Freq: Four times a day (QID) | RECTAL | Status: DC | PRN

## 2024-07-31 MED ORDER — ALBUTEROL SULFATE (2.5 MG/3ML) 0.083% IN NEBU: 2.5000 mg | INHALATION_SOLUTION | Freq: Four times a day (QID) | RESPIRATORY_TRACT | Status: DC | PRN

## 2024-07-31 MED ORDER — MIRABEGRON ER 25 MG PO TB24
25.0000 mg | ORAL_TABLET | Freq: Every day | ORAL | Status: DC
  Administered 2024-08-01 – 2024-08-02 (×2): 25 mg via ORAL
  Filled 2024-07-31 (×2): qty 1

## 2024-07-31 MED ORDER — ACETAMINOPHEN 325 MG PO TABS: 650.0000 mg | ORAL_TABLET | Freq: Four times a day (QID) | ORAL | Status: DC | PRN

## 2024-07-31 NOTE — Progress Notes (Signed)
 PHARMACIST - PHYSICIAN COMMUNICATION  CONCERNING:  Enoxaparin  (Lovenox ) for DVT Prophylaxis    RECOMMENDATION: Patient was prescribed enoxaprin 40mg  q24 hours for VTE prophylaxis.   Filed Weights   07/31/24 1259  Weight: 92.5 kg (204 lb)    Body mass index is 31.02 kg/m.  Estimated Creatinine Clearance: 53.7 mL/min (A) (by C-G formula based on SCr of 1.25 mg/dL (H)).   Based on [facility] policy patient is candidate for enoxaparin  0.5mg /kg TBW SQ every 24 hours based on BMI being >30.   DESCRIPTION: Pharmacy has adjusted enoxaparin  dose per [facility] policy.  Patient is now receiving enoxaparin  47.5 mg every 24 hours    Tiearra Colwell Rodriguez-Guzman PharmD, BCPS 07/31/2024 6:39 PM

## 2024-07-31 NOTE — H&P (Signed)
 History and Physical   Dakota Harper FMW:[id number] DOB: 06/02/1946 DOA: 07/31/2024  PCP: [facility]   Patient coming from: Home  Chief Complaint: Fall, lightheadedness  HPI: Dakota Harper is a 78 y.o. male with medical history significant of hypertension, hyperlipidemia, hypothyroidism, CKD 3B, anemia, gout, COPD, BPH, pituitary adenoma status post decompression surgery, tremors, depression, obesity, OSA, orthostatic hypotension, chronic pain presenting with lightheadedness and a fall.  Patient reports some generalized weakness for the past 2 to 3 weeks.  Also reporting some pleuritic chest pain.  States that today he was walking to get his walker and he felt lightheaded and ultimately fell to the ground.  States he hit his head on the ground but did not lose consciousness.  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  Cardiology note in March indicated he was at high risk for high-grade AV block in the future but currently was asymptomatic.  Also has history of pituitary adenoma status post decompression surgery and has been on Florinef  since having issues with hypotension in 2023.  Does follow with endocrinology as outpatient.  Currently lives at home alone.  Patient denies fevers, chills, shortness of breath, abdominal pain, constipation, diarrhea, nausea, vomiting.  ED Course: Vital signs in the ED notable for blood pressure in the 100s-150 systolic.  Lab workup in the ED notable for CMP with creatinine stable at 1.25, calcium  8.8, protein 5.7, albumin 3.2.  CBC within normal limits.  BNP indeterminate at 101.  D-dimer negative.  Troponin negative x 2.  Rester panel for flu COVID and RSV negative.  Urinalysis pending.  CT head and CT C-spine showed no acute abnormality.  No initial intervention in the ED.  Review of Systems: As per HPI otherwise all other systems reviewed and are negative.  Past Medical  History:  Diagnosis Date   Acute renal failure superimposed on stage 3a chronic kidney disease (HCC) 10/27/2020   Acute urinary retention 07/10/2022   Acute UTI 07/11/2022   AKI (acute kidney injury) 07/10/2022   Anxiety    Arthritis    Asthma    Basal cell carcinoma 07/04/2021   L nasal ala. MOHS completed 12/13/2021   BPH (benign prostatic hyperplasia)    Colon adenomas    Community acquired bacterial pneumonia 11/25/2019   COPD (chronic obstructive pulmonary disease) (HCC)    COVID    Depression    in the 1960's   Dysplastic nevus 02/28/2021   R scapula, mod atypia   Dysplastic nevus 03/08/2023   severe at left upper back. 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   Left posterior crown, EDC 03/19/2024   Wears hearing aid in both ears     Past Surgical History:  Procedure Laterality Date   ADJACENT TISSUE TRANSFER/TISSUE REARRANGEMENT Left 12/19/2021   Procedure: ADJACENT TISSUE TRANSFER/TISSUE REARRANGEMENT;  Surgeon: Elisabeth Craig RAMAN, MD;  Location: [facility];  Service: Plastics;  Laterality: Left;   CATARACT EXTRACTION W/PHACO Right 09/11/2022   Procedure: CATARACT EXTRACTION PHACO AND INTRAOCULAR LENS PLACEMENT (IOC) RIGHT  3.53  00:38.4;  Surgeon: Myrna Adine Anes, MD;  Location: [facility];  Service: Ophthalmology;  Laterality: Right;   CATARACT EXTRACTION W/PHACO Left 09/25/2022   Procedure: CATARACT EXTRACTION PHACO AND INTRAOCULAR LENS PLACEMENT (IOC) LEFT;  Surgeon: Myrna Adine Anes, MD;  Location: [facility];  Service: Ophthalmology;  Laterality: Left;  2.80 0:27.8   CHOLECYSTECTOMY     COLONOSCOPY     COLONOSCOPY WITH PROPOFOL  N/A 08/01/2021   Procedure: COLONOSCOPY WITH PROPOFOL ;  Surgeon:  Therisa Bi, MD;  Location: [facility]COPY;  Service: Gastroenterology;  Laterality: N/A;   ESOPHAGOGASTRODUODENOSCOPY (EGD) WITH PROPOFOL  N/A 11/30/2021   Procedure: ESOPHAGOGASTRODUODENOSCOPY (EGD) WITH PROPOFOL ;  Surgeon: Therisa Bi, MD;  Location: [facility] END[facility]COPY;  Service: Gastroenterology;  Laterality: N/A;   HERNIA REPAIR     HOLEP-LASER ENUCLEATION OF THE PROSTATE WITH MORCELLATION N/A 09/04/2022   Procedure: HOLEP-LASER ENUCLEATION OF THE PROSTATE WITH MORCELLATION;  Surgeon: Penne Knee, MD;  Location: [facility]S;  Service: Urology;  Laterality: N/A;   NASAL RECONSTRUCTION Left 12/19/2021   Procedure: NASAL RECONSTRUCTION;  Surgeon: Elisabeth Craig RAMAN, MD;  Location: [facility];  Service: Plastics;  Laterality: Left;  2 hours   pituitary tumor excision      Social History  reports that he has quit smoking. His smoking use included cigarettes. He has never used smokeless tobacco. He reports that he does not drink alcohol  and does not use drugs.  Allergies  Allergen Reactions   Saw Palmetto Hives, Rash and Dermatitis    Berry form  saw palmetto extract   Sulfacetamide Sodium Dermatitis   Other Other (See Comments) and Rash   Sulfa Antibiotics Rash and Dermatitis    Substance with sulfonamide structure and antibacterial mechanism of action (substance)   Sulfacetamide Sodium Rash    Family History  Problem Relation Age of Onset   Kidney cancer Mother    Heart attack Father    Alzheimer's disease Father    Heart attack Sister    Diabetes Mellitus II Sister    Benign prostatic hyperplasia Brother    Prostate cancer Neg Hx    Bladder Cancer Neg Hx   Reviewed on Admission  Prior to Admission medications   Medication Sig Start Date End Date Taking? Authorizing Provider  acetaminophen  (TYLENOL ) 500 MG tablet Take 500 mg by mouth every 4 (four) hours as needed for moderate pain or mild pain.    [provider]  albuterol  (PROVENTIL  HFA;VENTOLIN  HFA) 108 (90 BASE) MCG/ACT  inhaler Inhale 1 puff into the lungs 3 (three) times daily as needed for wheezing or shortness of breath.    [provider]  alum & mag hydroxide-simeth (MAALOX/MYLANTA) 200-200-20 MG/5ML suspension Take 30 mLs by mouth 4 (four) times daily as needed for indigestion or heartburn.    [provider]  amLODipine  (NORVASC ) 5 MG tablet Take 1 tablet (5 mg total) by mouth daily. 07/13/22 09/19/22  Jhonny Calvin NOVAK, MD  aspirin  EC 81 MG tablet Take 81 mg by mouth at bedtime. (2100)    [provider]  atorvastatin  (LIPITOR) 10 MG tablet Take 10 mg by mouth every evening. (2000)    [provider]  carbamide peroxide (EAR WAX REMOVAL DROPS) 6.5 % OTIC solution Place 5 drops into both ears every Sunday.    [provider]  chlorhexidine  (PERIDEX ) 0.12 % solution Use as directed 15 mLs in the mouth or throat 2 (two) times daily as needed (oral discomfort).    [provider]  cycloSPORINE  (RESTASIS ) 0.05 % ophthalmic emulsion Place 1 drop into both eyes 2 (two)  times daily as needed (dry eyes).    [provider]  diclofenac  Sodium (VOLTAREN ) 1 % GEL Apply 2 g topically as needed (lower left leg pain.). 03/31/21   [provider]  diphenhydrAMINE (BENADRYL) 25 MG tablet Take 25 mg by mouth every 6 (six) hours as needed for allergies.    [provider]  divalproex  (DEPAKOTE ) 250 MG DR tablet Take 250 mg by mouth 2 (two) times daily. 03/05/23   [provider]  Dupilumab  (DUPIXENT ) 300 MG/2ML SOAJ Inject 300 mg into the skin every 14 (fourteen) days. Starting at day 15 for maintenance. 06/16/24   Claudene Lehmann, MD  fenofibrate  54 MG tablet Take 1 tablet (54 mg total) by mouth daily. 11/01/20   Jhonny Calvin NOVAK, MD  finasteride  (PROSCAR ) 5 MG tablet Take 1 tablet (5 mg total) by mouth daily. 01/22/20   McGowan, Clotilda LABOR, PA-C  fludrocortisone  (FLORINEF ) 0.1 MG tablet Take 100 mcg by mouth daily. 03/05/23   [provider]  Fluocinolone  Acetonide Body 0.01 % OIL Apply to scalp 1-2 times a day and leave on. 07/04/22   Jackquline Sawyer, MD  fluocinonide  ointment (LIDEX ) 0.05 % Apply 1 Application topically 2 (two) times daily as needed (pruritus). (apply to legs) 06/02/24   Claudene Lehmann, MD  fluticasone  furoate-vilanterol (BREO ELLIPTA ) 200-25 MCG/INH AEPB Inhale 1 puff into the lungs daily. (0800) 08/10/20   [provider]  gabapentin  (NEURONTIN ) 100 MG capsule Take 100 mg by mouth at bedtime. Take along with one 400 mg capsule for total dose 500 mg daily at bedtime 06/23/22   [provider]  gabapentin  (NEURONTIN ) 400 MG capsule Take 400 mg by mouth at bedtime. Take along with one 100 mg capsule for total dose 500 mg daily at bedtime 11/08/21   [provider]  guaiFENesin  (ROBITUSSIN) 100 MG/5ML liquid Take 300 mg by mouth every 6 (six) hours as needed for cough.    [provider]  HYDROcodone -acetaminophen  (NORCO/VICODIN) 5-325 MG tablet Take 1-2 tablets by mouth every 6 (six) hours as needed for moderate pain. 09/04/22   Penne Knee, MD  ketoconazole  (NIZORAL ) 2 % cream Apply 1 Application topically daily.    [provider]  ketoconazole  (NIZORAL ) 2 % shampoo Massage into scalp daily and let sit several minutes before rinsing. 10/12/21   Jackquline Sawyer, MD  lactulose  (CHRONULAC ) 10 GM/15ML solution Take 30 mLs (20 g total) by mouth daily as needed for mild constipation. 03/20/21   Sung, Jade J, MD  levothyroxine  (SYNTHROID , LEVOTHROID) 50 MCG tablet Take 50 mcg by mouth daily before breakfast. (0630)    [provider]  loperamide  (IMODIUM ) 2 MG capsule Take 4 mg by mouth as needed for diarrhea or loose stools.    [provider]  magnesium  hydroxide (MILK OF MAGNESIA) 400 MG/5ML suspension Take 30 mLs by mouth daily as needed (constipation (no bowel movement for 3 days)).    [provider]  methylPREDNISolone  (MEDROL  DOSEPAK) 4  MG TBPK tablet 6 day dose pack - take as directed 03/04/24   Janit Thresa HERO, DPM  mirtazapine (REMERON) 15 MG tablet TAKE 1 TABLET BY MOUTH EVERY DAY BEFORE BEDTIME    [provider]  montelukast  (SINGULAIR ) 5 MG chewable tablet Chew 5 mg by mouth at bedtime. (2100)    [provider]  nemolizumab-ilto  (NEMLUVIO ) 30 MG SQ injection Inject 30 mg into the skin every 28 (twenty-eight) days. 06/02/24   Claudene Lehmann, MD  nortriptyline  (PAMELOR ) 75 MG capsule  Take 150 mg by mouth daily. (0900)    [provider]  omeprazole  (PRILOSEC) 40 MG capsule Take 1 capsule (40 mg total) by mouth daily. 11/16/21   Therisa Bi, MD  ondansetron  (ZOFRAN -ODT) 4 MG disintegrating tablet Take 1 tablet (4 mg total) by mouth every 8 (eight) hours as needed for nausea or vomiting. 12/07/21   Landy Honora CROME, PA-C  paliperidone  (INVEGA ) 3 MG 24 hr tablet Take 3 mg by mouth at bedtime.    [provider]  permethrin  (ELIMITE ) 5 % cream APPLY 1 APPLICATIONS TOPICALLY EVERY MORNING AND EVERY NIGHT AT BEDTIME TO FACE AND SCALP 02/18/24   Jackquline Sawyer, MD  polyethylene glycol (MIRALAX  / GLYCOLAX ) 17 g packet Take 17 g by mouth daily as needed (constipation.).    [provider]  ramelteon (ROZEREM) 8 MG tablet Take 8 mg by mouth at bedtime.    [provider]  Skin Protectants, Misc. (EUCERIN) cream Apply 1 application  topically in the morning and at bedtime. (0800 & 2000)    [provider]  tacrolimus  (PROTOPIC ) 0.1 % ointment Apply topically 2 (two) times daily. 06/02/24   Claudene Lehmann, MD  tadalafil  (CIALIS ) 20 MG tablet Take one tablet at least 60 minutes prior to sexual activity. 09/12/22   Vaillancourt, Samantha, PA-C  tamsulosin  (FLOMAX ) 0.4 MG CAPS capsule Take 0.4 mg by mouth at bedtime. (2100)    [provider]  tiZANidine  (ZANAFLEX ) 2 MG tablet Take 2 mg by mouth at bedtime. (2000)    [provider]  valproic  acid (DEPAKENE ) 250  MG capsule Take 250 mg by mouth 2 (two) times daily.    [provider]  Vibegron  (GEMTESA ) 75 MG TABS Take 1 tablet (75 mg total) by mouth daily. 07/26/23   Helon Clotilda LABOR, PA-C    Physical Exam: Vitals:   07/31/24 1258 07/31/24 1259 07/31/24 1600 07/31/24 1806  BP: 106/64  (!) 156/72   Pulse: 78  76   Resp: 14  16   Temp: 98.3 F (36.8 C)   98.2 F (36.8 C)  TempSrc: Oral  Oral Oral  SpO2: 98%  100%   Weight:  92.5 kg    Height:  5' 8 (1.727 m)      Physical Exam Constitutional:      General: He is not in acute distress.    Appearance: Normal appearance.  HENT:     Head: Normocephalic and atraumatic.     Mouth/Throat:     Mouth: Mucous membranes are moist.     Pharynx: Oropharynx is clear.  Eyes:     Extraocular Movements: Extraocular movements intact.     Pupils: Pupils are equal, round, and reactive to light.  Cardiovascular:     Rate and Rhythm: Normal rate and regular rhythm.     Pulses: Normal pulses.     Heart sounds: Normal heart sounds.  Pulmonary:     Effort: Pulmonary effort is normal. No respiratory distress.     Breath sounds: Normal breath sounds.  Abdominal:     General: Bowel sounds are normal. There is no distension.     Palpations: Abdomen is soft.     Tenderness: There is no abdominal tenderness.  Musculoskeletal:        General: No swelling or deformity.     Right lower leg: Edema present.     Left lower leg: Edema present.  Skin:    General: Skin is warm and dry.  Neurological:  General: No focal deficit present.     Mental Status: Mental status is at baseline.    Labs on Admission: I have personally reviewed following labs and imaging studies  CBC: Recent Labs  Lab 07/31/24 1303  WBC 7.4  HGB 13.5  HCT 39.9  MCV 91.9  PLT 243    Basic Metabolic Panel: Recent Labs  Lab 07/31/24 1303  NA 140  K 3.9  CL 105  CO2 26  GLUCOSE 93  BUN 20  CREATININE 1.25*  CALCIUM  8.8*    GFR: Estimated Creatinine  Clearance: 53.7 mL/min (A) (by C-G formula based on SCr of 1.25 mg/dL (H)).  Liver Function Tests: Recent Labs  Lab 07/31/24 1303  AST 25  ALT 12  ALKPHOS 43  BILITOT 0.6  PROT 5.7*  ALBUMIN 3.2*    Urine analysis:    Component Value Date/Time   COLORURINE AMBER (A) 07/10/2024 0401   APPEARANCEUR HAZY (A) 07/10/2024 0401   APPEARANCEUR Clear 07/12/2023 1527   LABSPEC 1.024 07/10/2024 0401   PHURINE 5.0 07/10/2024 0401   GLUCOSEU NEGATIVE 07/10/2024 0401   HGBUR MODERATE (A) 07/10/2024 0401   BILIRUBINUR NEGATIVE 07/10/2024 0401   BILIRUBINUR Negative 07/12/2023 1527   KETONESUR 5 (A) 07/10/2024 0401   PROTEINUR 100 (A) 07/10/2024 0401   NITRITE NEGATIVE 07/10/2024 0401   LEUKOCYTESUR NEGATIVE 07/10/2024 0401    Radiological Exams on Admission: CT Head Wo Contrast Result Date: 07/31/2024 EXAM: CT HEAD AND CERVICAL SPINE 07/31/2024 03:58:27 PM TECHNIQUE: CT of the head and cervical spine was performed without the administration of intravenous contrast. Multiplanar reformatted images are provided for review. Automated exposure control, iterative reconstruction, and/or weight based adjustment of the mA/kV was utilized to reduce the radiation dose to as low as reasonably achievable. COMPARISON: 06/14/2023 CLINICAL HISTORY: Head trauma, minor (Age >= 65y). First Nurse Note: Patient to ED via [facility] from home for fall due to dizziness. Denies LOC or blood thinners. Hit head on hardwood floor. Denies pain but still feeling dizzy. +orthostatic. FINDINGS: CT HEAD BRAIN AND VENTRICLES: No acute intracranial hemorrhage. No mass effect or midline shift. No abnormal extra-axial fluid collection. No evidence of acute infarct. No hydrocephalus. Mild age-related atrophy. Subcortical and periventricular small vessel ischemic changes. Mild intracranial atherosclerosis. ORBITS: No acute abnormality. SINUSES AND MASTOIDS: No acute abnormality. SOFT TISSUES AND SKULL: No acute skull fracture. No acute  soft tissue abnormality. CT CERVICAL SPINE BONES AND ALIGNMENT: No acute fracture. 3 mm anterolisthesis of C3 on C4. No traumatic malalignment. DEGENERATIVE CHANGES: Mild degenerative changes of the mid cervical spine, most prominent at C4-5 and C6-7. SOFT TISSUES: No prevertebral soft tissue swelling. IMPRESSION: 1. No acute intracranial abnormality. 2. No acute traumatic injury to the cervical spine. Electronically signed by: Pinkie Pebbles MD 07/31/2024 05:09 PM EDT RP Workstation: [id number]   CT Cervical Spine Wo Contrast Result Date: 07/31/2024 EXAM: CT HEAD AND CERVICAL SPINE 07/31/2024 03:58:27 PM TECHNIQUE: CT of the head and cervical spine was performed without the administration of intravenous contrast. Multiplanar reformatted images are provided for review. Automated exposure control, iterative reconstruction, and/or weight based adjustment of the mA/kV was utilized to reduce the radiation dose to as low as reasonably achievable. COMPARISON: 06/14/2023 CLINICAL HISTORY: Head trauma, minor (Age >= 65y). First Nurse Note: Patient to ED via [facility] from home for fall due to dizziness. Denies LOC or blood thinners. Hit head on hardwood floor. Denies pain but still feeling dizzy. +orthostatic. FINDINGS: CT HEAD BRAIN AND VENTRICLES: No acute intracranial hemorrhage.  No mass effect or midline shift. No abnormal extra-axial fluid collection. No evidence of acute infarct. No hydrocephalus. Mild age-related atrophy. Subcortical and periventricular small vessel ischemic changes. Mild intracranial atherosclerosis. ORBITS: No acute abnormality. SINUSES AND MASTOIDS: No acute abnormality. SOFT TISSUES AND SKULL: No acute skull fracture. No acute soft tissue abnormality. CT CERVICAL SPINE BONES AND ALIGNMENT: No acute fracture. 3 mm anterolisthesis of C3 on C4. No traumatic malalignment. DEGENERATIVE CHANGES: Mild degenerative changes of the mid cervical spine, most prominent at C4-5 and C6-7. SOFT TISSUES: No  prevertebral soft tissue swelling. IMPRESSION: 1. No acute intracranial abnormality. 2. No acute traumatic injury to the cervical spine. Electronically signed by: Pinkie Pebbles MD 07/31/2024 05:09 PM EDT RP Workstation: [id number]   EKG: Independently reviewed.  Sinus rhythm at 70 bpm.  Right bundle branch block with QRS 174.  Nonspecific T wave changes.  Significant artifact interfering with interpretation.  QTc prolonged at 501.  Assessment/Plan Active Problems:   Orthostatic hypotension   Hypertension, benign   Stage 3b chronic kidney disease (CKD) (HCC)   BPH (benign prostatic hyperplasia)   Hypothyroidism   OSA (obstructive sleep apnea)   Tremors of nervous system   COPD with chronic bronchitis (HCC)   Chronic pain syndrome   Gout   Hypercholesterolemia   Obesity (BMI 35.0-39.9 without comorbidity)   Pituitary adenoma (HCC)   Recurrent depressive disorder   Normocytic anemia   Near syncope   Near syncope > Patient presenting with lightheaded episode where he nearly passed out.  Did fall but did not lose consciousness. > Additionally reports frequent lightheadedness > His head but CT head and CT C-spine are negative in ED. > Likely etiology.  Some history of orthostatic hypotension and has been on Florinef  ever since he had issues with hypotension in 2023.  This is in the setting of pituitary adenoma status post decompression surgery. 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  However no high-grade block on EKG today. - Monitor on telemetry overnight - Hold off on repeat echocardiogram as this was recently done - Orthostatic vital signs - Check magnesium  - Supportive care  History of pituitary adenoma Hypothyroidism > As per HPI does have history of pituitary adenoma status post decompressive surgery.  > Has hypothyroidism for which he takes Synthroid .  Previously on  hydrocortisone  and has been on Florinef  since 2023 when he had issues with hypotension. > Follows with nephrology outpatient. Last seen in July of this year with no medication adjustments indicated at that time. - Continue with Synthroid  - Continue home Florinef , if workup otherwise negative may need to increase frequency/dose  Hypertension > States he stopped taking his amlodipine  about 2 months ago. - Continue to Hold amlodipine    Hyperlipidemia - Continue home atorvastatin , fenofibrate   CKD 3B > Creatinine stable in the ED - Trend renal function and electrolytes  Anemia > Normal hemoglobin in the ED - Trend CBC  COPD - Continue home Breo, as needed albuterol , Singulair   BPH - Continue home tamsulosin , finasteride  - Replace home Gemtesa  formulary Myrbetriq   Depression > History of SI and self-harm behavior. - Continue home Depakote , nortriptyline  - Confirm he continues take paliperidone    Obesity - Noted  OSA - Continue CPAP  DVT prophylaxis: Lovenox  Code Status:   Full Family Communication:  None on admission Disposition Plan:   Patient is from:  Home  Anticipated DC to:  Home   Anticipated  DC date:  1 to 2 days  Anticipated DC barriers: None  Consults called:  None Admission status:  Observation, telemetry  Severity of Illness: The appropriate patient status for this patient is OBSERVATION. Observation status is judged to be reasonable and necessary in order to provide the required intensity of service to ensure the patient's safety. The patient's presenting symptoms, physical exam findings, and initial radiographic and laboratory data in the context of their medical condition is felt to place them at decreased risk for further clinical deterioration. Furthermore, it is anticipated that the patient will be medically stable for discharge from the hospital within 2 midnights of admission.    Marsa KATHEE Scurry MD [facility]  How to contact the [facility]  Attending or Consulting provider 7A - 7P or covering provider during after hours 7P -7A, for this patient?   Check the care team in [facility] and look for a) attending/consulting [facility] provider listed and b) the [facility] team listed Log into [email or web address] and use [place]'s universal password to access. If you do not have the password, please contact the hospital operator. Locate the [facility] provider you are looking for under [facility] and page to a number that you can be directly reached. If you still have difficulty reaching the provider, please page the [facility] (Director on Call) for the Hospitalists listed on amion for assistance.  07/31/2024, 6:15 PM

## 2024-07-31 NOTE — ED Triage Notes (Signed)
 First Nurse Note: Patient to ED via [facility] from home for fall due to dizziness. Denies LOC or blood thinners. Hit head on hardwood floor. Denies pain but still feeling dizzy. +orthostatic.  90/52 sitting, 124/64- laying 124  cbg 83 HR 98% RA  20 R AC- receiving 500 NaCl

## 2024-07-31 NOTE — ED Provider Notes (Signed)
 [facility] Provider Note    Event Date/Time   First MD Initiated Contact with Patient 07/31/24 1458     (approximate)   History   Weakness and Fall   HPI  Dakota Harper is a 78 year old male with history of COPD, gout, hypertension, CKD presenting to the emergency department for evaluation of lightheadedness and fall.  Patient reports he has felt generally weak for the past several weeks.  Does report some associated pleuritic chest pain.  No fevers or chills.  Denies nausea, vomiting, numbness, tingling, focal weakness.  Reports he was walking to get to his walker today when he began to feel dizzy described as a lightheadedness and fell and hit his head on the ground.  Does not think he passed out.  Not on anticoagulation.  Reviewed cardiology visit from 01/15/2024.  At that time patient was evaluated in the setting of abnormal EKG with right bundle branch block and left anterior fascicular block.  Was not complaining of dizziness or syncope at that time.  Did report some dyspnea on exertion.  He was noted to be at high risk for high-grade AV block in the future but no plans for pacemaker outpatient monitor at that time.  Did have lower extremity edema for which echo was ordered at that time.Performed in May which did not demonstrate significant abnormalities.     Physical Exam   Triage Vital Signs: ED Triage Vitals  Encounter Vitals Group     BP 07/31/24 1258 106/64     Girls Systolic BP Percentile --      Girls Diastolic BP Percentile --      Boys Systolic BP Percentile --      Boys Diastolic BP Percentile --      Pulse Rate 07/31/24 1258 78     Resp 07/31/24 1258 14     Temp 07/31/24 1258 98.3 F (36.8 C)     Temp Source 07/31/24 1258 Oral     SpO2 07/31/24 1258 98 %     Weight 07/31/24 1259 204 lb (92.5 kg)     Height 07/31/24 1259 5' 8 (1.727 m)     Head Circumference --      Peak Flow --      Pain Score 07/31/24 1259 0     Pain Loc --       Pain Education --      Exclude from Growth Chart --     Most recent vital signs: Vitals:   07/31/24 1258 07/31/24 1600  BP: 106/64 (!) 156/72  Pulse: 78 76  Resp: 14 16  Temp: 98.3 F (36.8 C)   SpO2: 98% 100%     General: Awake, interactive  CV:  Regular rate, good peripheral perfusion.  Resp:  Unlabored respirations, lungs clear to auscultation Abd:  Nondistended, soft, nontender Neuro:  Symmetric facial movement, fluid speech, mild generalized weakness of the bilateral upper and lower extremities with normal coordination   ED Results / Procedures / Treatments   Labs (all labs ordered are listed, but only abnormal results are displayed) Labs Reviewed  COMPREHENSIVE METABOLIC PANEL WITH GFR - Abnormal; Notable for the following components:      Result Value   Creatinine, Ser 1.25 (*)    Calcium  8.8 (*)    Total Protein 5.7 (*)    Albumin 3.2 (*)    GFR, Estimated 59 (*)    All other components within normal limits  BRAIN NATRIURETIC PEPTIDE - Abnormal; Notable for  the following components:   B Natriuretic Peptide 101.1 (*)    All other components within normal limits  RESP PANEL BY RT-PCR (RSV, FLU A&B, COVID)  RVPGX2  CBC  D-DIMER, QUANTITATIVE  URINALYSIS, ROUTINE W REFLEX MICROSCOPIC  CBG MONITORING, ED  TROPONIN I (HIGH SENSITIVITY)  TROPONIN I (HIGH SENSITIVITY)     EKG EKG independently reviewed and interpreted by myself demonstrates:  EKG demonstrates normal sinus rhythm at a rate of 78, PR 200, QRS 124, QTc 501, nonspecific ST changes with right bundle branch block and left anterior fascicular block noted, no STEMI  RADIOLOGY Imaging independently reviewed and interpreted by myself demonstrates:  CT head without acute bleed CT cervical spine without fracture  Formal Radiology Read:  CT Head Wo Contrast Result Date: 07/31/2024 EXAM: CT HEAD AND CERVICAL SPINE 07/31/2024 03:58:27 PM TECHNIQUE: CT of the head and cervical spine was performed without  the administration of intravenous contrast. Multiplanar reformatted images are provided for review. Automated exposure control, iterative reconstruction, and/or weight based adjustment of the mA/kV was utilized to reduce the radiation dose to as low as reasonably achievable. COMPARISON: 06/14/2023 CLINICAL HISTORY: Head trauma, minor (Age >= 65y). First Nurse Note: Patient to ED via [facility] from home for fall due to dizziness. Denies LOC or blood thinners. Hit head on hardwood floor. Denies pain but still feeling dizzy. +orthostatic. FINDINGS: CT HEAD BRAIN AND VENTRICLES: No acute intracranial hemorrhage. No mass effect or midline shift. No abnormal extra-axial fluid collection. No evidence of acute infarct. No hydrocephalus. Mild age-related atrophy. Subcortical and periventricular small vessel ischemic changes. Mild intracranial atherosclerosis. ORBITS: No acute abnormality. SINUSES AND MASTOIDS: No acute abnormality. SOFT TISSUES AND SKULL: No acute skull fracture. No acute soft tissue abnormality. CT CERVICAL SPINE BONES AND ALIGNMENT: No acute fracture. 3 mm anterolisthesis of C3 on C4. No traumatic malalignment. DEGENERATIVE CHANGES: Mild degenerative changes of the mid cervical spine, most prominent at C4-5 and C6-7. SOFT TISSUES: No prevertebral soft tissue swelling. IMPRESSION: 1. No acute intracranial abnormality. 2. No acute traumatic injury to the cervical spine. Electronically signed by: Pinkie Pebbles MD 07/31/2024 05:09 PM EDT RP Workstation: [id number]   CT Cervical Spine Wo Contrast Result Date: 07/31/2024 EXAM: CT HEAD AND CERVICAL SPINE 07/31/2024 03:58:27 PM TECHNIQUE: CT of the head and cervical spine was performed without the administration of intravenous contrast. Multiplanar reformatted images are provided for review. Automated exposure control, iterative reconstruction, and/or weight based adjustment of the mA/kV was utilized to reduce the radiation dose to as low as reasonably  achievable. COMPARISON: 06/14/2023 CLINICAL HISTORY: Head trauma, minor (Age >= 65y). First Nurse Note: Patient to ED via [facility] from home for fall due to dizziness. Denies LOC or blood thinners. Hit head on hardwood floor. Denies pain but still feeling dizzy. +orthostatic. FINDINGS: CT HEAD BRAIN AND VENTRICLES: No acute intracranial hemorrhage. No mass effect or midline shift. No abnormal extra-axial fluid collection. No evidence of acute infarct. No hydrocephalus. Mild age-related atrophy. Subcortical and periventricular small vessel ischemic changes. Mild intracranial atherosclerosis. ORBITS: No acute abnormality. SINUSES AND MASTOIDS: No acute abnormality. SOFT TISSUES AND SKULL: No acute skull fracture. No acute soft tissue abnormality. CT CERVICAL SPINE BONES AND ALIGNMENT: No acute fracture. 3 mm anterolisthesis of C3 on C4. No traumatic malalignment. DEGENERATIVE CHANGES: Mild degenerative changes of the mid cervical spine, most prominent at C4-5 and C6-7. SOFT TISSUES: No prevertebral soft tissue swelling. IMPRESSION: 1. No acute intracranial abnormality. 2. No acute traumatic injury to the cervical spine. Electronically  signed by: Pinkie Pebbles MD 07/31/2024 05:09 PM EDT RP Workstation: [id number]    PROCEDURES:  Critical Care performed: No  Procedures   MEDICATIONS ORDERED IN ED: Medications - No data to display   IMPRESSION / MDM / ASSESSMENT AND PLAN / ED COURSE  I reviewed the triage vital signs and the nursing notes.  Differential diagnosis includes, but is not limited to, arrhythmia, anemia, electrolyte abnormality, dehydration, viral illness, pneumonia, clinical exam suggestive of COPD exacerbation, intracranial bleed, cervical spine fracture, no evidence of thoracoabdominal trauma  Patient's presentation is most consistent with acute presentation with potential threat to life or bodily function.  78 year old male presenting to the emergency department evaluation of  lightheadedness and weakness.  Sounds like patient had a near syncopal episode resulting in fall with head trauma.  Stable vitals on presentation.  Labs with reassuring CBC, CMP with stable renal impairment.With pleuritic chest pain and shortness of breath will obtain troponin, BNP, D-dimer, viral swab as well.  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  CT head and CT C-spine unremarkable.  On reevaluation, patient continues to feel lightheaded but has not had any further near syncopal episodes.  Testing is overall reassuring, but with his bifascicular block and your syncopal episode, do think admission for further evaluation is reasonable.  Patient comfortable with this plan.  Will reach out to hospitalist team.  [phone] Case discussed with hospitalist team.  They will evaluate for anticipated admission.     FINAL CLINICAL IMPRESSION(S) / ED DIAGNOSES   Final diagnoses:  Near syncope  Closed head injury, initial encounter     Rx / DC Orders   ED Discharge Orders     None        Note:  This document was prepared using Dragon voice recognition software and may include unintentional dictation errors.   Levander Slate, MD 07/31/24 [phone]

## 2024-08-01 DIAGNOSIS — R55 Syncope and collapse: Secondary | ICD-10-CM | POA: Diagnosis not present

## 2024-08-01 LAB — CBC
HCT: 39 % (ref 39.0–52.0)
Hemoglobin: 13.4 g/dL (ref 13.0–17.0)
MCH: 31.5 pg (ref 26.0–34.0)
MCHC: 34.4 g/dL (ref 30.0–36.0)
MCV: 91.5 fL (ref 80.0–100.0)
Platelets: 236 K/uL (ref 150–400)
RBC: 4.26 MIL/uL (ref 4.22–5.81)
RDW: 13.5 % (ref 11.5–15.5)
WBC: 7.1 K/uL (ref 4.0–10.5)
nRBC: 0 % (ref 0.0–0.2)

## 2024-08-01 LAB — COMPREHENSIVE METABOLIC PANEL WITH GFR
ALT: 11 U/L (ref 0–44)
AST: 16 U/L (ref 15–41)
Albumin: 2.9 g/dL — ABNORMAL LOW (ref 3.5–5.0)
Alkaline Phosphatase: 44 U/L (ref 38–126)
Anion gap: 9 (ref 5–15)
BUN: 21 mg/dL (ref 8–23)
CO2: 28 mmol/L (ref 22–32)
Calcium: 8.8 mg/dL — ABNORMAL LOW (ref 8.9–10.3)
Chloride: 106 mmol/L (ref 98–111)
Creatinine, Ser: 1.6 mg/dL — ABNORMAL HIGH (ref 0.61–1.24)
GFR, Estimated: 44 mL/min — ABNORMAL LOW (ref >60)
Glucose, Bld: 95 mg/dL (ref 70–99)
Potassium: 4.1 mmol/L (ref 3.5–5.1)
Sodium: 143 mmol/L (ref 135–145)
Total Bilirubin: 0.8 mg/dL (ref 0.0–1.2)
Total Protein: 5.8 g/dL — ABNORMAL LOW (ref 6.5–8.1)

## 2024-08-01 MED ORDER — LOSARTAN POTASSIUM 25 MG PO TABS
25.0000 mg | ORAL_TABLET | Freq: Every day | ORAL | Status: DC
  Administered 2024-08-01 – 2024-08-02 (×2): 25 mg via ORAL
  Filled 2024-08-01 (×2): qty 1

## 2024-08-01 MED ORDER — MIDODRINE HCL 5 MG PO TABS
2.5000 mg | ORAL_TABLET | Freq: Three times a day (TID) | ORAL | Status: DC
  Administered 2024-08-01 – 2024-08-02 (×2): 2.5 mg via ORAL
  Filled 2024-08-01 (×2): qty 1

## 2024-08-01 NOTE — TOC Initial Note (Signed)
 Transition of Care [facility]) - Initial/Assessment Note    Patient Details  Name: Dakota Harper MRN: [id number] Date of Birth: May 23, 1946  [facility] CM/SW Contact:    Dalia GORMAN Fuse, RN Phone Number: 08/01/2024, 9:23 AM  Clinical Narrative:                 TOC completed review of the medical record. No TOC needs at this time, please outreach if needs are identified.        Patient Goals and CMS Choice            Expected Discharge Plan and Services                                              Prior Living Arrangements/Services                       Activities of Daily Living   ADL Screening (condition at time of admission) Independently performs ADLs?: Yes (appropriate for developmental age) Is the patient deaf or have difficulty hearing?: No Does the patient have difficulty seeing, even when wearing glasses/contacts?: No Does the patient have difficulty concentrating, remembering, or making decisions?: No  Permission Sought/Granted                  Emotional Assessment              Admission diagnosis:  Near syncope [R55] Closed head injury, initial encounter [S09.90XA] Patient Active Problem List   Diagnosis Date Noted   Near syncope 07/31/2024   Pain due to onychomycosis of toenails of both feet 07/17/2022   Hyponatremia 07/10/2022   Mild protein malnutrition 07/10/2022   Thrombocytopenia 07/10/2022   Normocytic anemia 07/10/2022   Orthostatic hypotension 06/08/2022   Stage 3b chronic kidney disease (CKD) (HCC) 06/08/2022   Dehydration    Callus of foot 06/09/2021   COPD with chronic bronchitis (HCC) 10/27/2020   Ingrown left big toenail 10/25/2020   Ingrown nail of great toe of right foot 10/25/2020   Hypothyroidism 11/25/2019   BPH (benign prostatic hyperplasia) 11/25/2019   Headache 11/25/2019   Tremors of nervous system 11/25/2019   Thrombocytosis 11/25/2019   Elevated bilirubin 11/25/2019   OSA  (obstructive sleep apnea) 11/14/2017   Chronic pain syndrome 10/18/2015   Self-injurious behavior 10/18/2015   Suicidal ideation 10/18/2015   History of substance use 10/17/2015   Generalized abdominal pain 10/07/2015   Chronic idiopathic constipation 09/24/2015   Eczema 06/24/2015   Plantar fasciitis, left 11/19/2014   Chest pain- musculoskeletal 07/03/2014   Leg pain, bilateral 07/03/2014   Varicose veins 07/03/2014   Shoulder bursitis 07/02/2014   Cholelithiasis 01/14/2014   Cataract cortical, senile 01/09/2014   Meibomian gland dysfunction (MGD), bilateral, both upper and lower lids 01/09/2014   Pinguecula of both eyes 01/09/2014   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 [phone]  [address] [place] [address] Phone: [phone] Fax: [phone]  [facility], [place], [place] - [address] [place] [address] Phone: [phone] Fax: [phone]     Social Drivers of Health (SDOH) Social History: SDOH Screenings   Food Insecurity: No Food Insecurity (07/31/2024)  Housing: Unknown (07/31/2024)  Transportation Needs: No Transportation Needs (07/31/2024)  Utilities: Not At Risk (07/31/2024)  Financial Resource Strain: Low Risk  (04/15/2024)   Received from [facility]  Social Connections: Unknown (07/31/2024)  Tobacco Use: Medium Risk (07/09/2024)   SDOH  Interventions:     Readmission Risk Interventions     No data to display

## 2024-08-01 NOTE — Evaluation (Signed)
 Physical Therapy Evaluation Patient Details Name: Dakota Harper MRN: [id number] DOB: 03-04-1946 Today's Date: 08/01/2024  History of Present Illness  78 y.o. male with medical history significant of hypertension, hyperlipidemia, hypothyroidism, CKD 3B, anemia, gout, COPD, BPH, pituitary adenoma status post decompression surgery, tremors, depression, obesity, OSA, orthostatic hypotension, chronic pain presenting with lightheadedness and a fall. CT head/neck negative.   Clinical Impression  Pt A&Ox4, agreeable to PT evaluation, denied pain throughout session. At baseline, pt reports being modI with mobility, cites [facility] for household amb and rollator for community distances, hx of at least 1 fall in the last 6 months. Pt was met supine in bed, minA trunk assist for bed mobility. STS from EOB with RW and CGA. Pt amb ~158ft, intermittent standing rest breaks encouraged to assess BP. Pt noted to be orthostatic, with BP 117/70 sitting EOB and 83/52 after standing for ~3 minutes. BP 140/74 at end of session. Pt was left supine in bed, all needs in reach. Pt would benefit from skilled PT intervention to address listed deficits (see PT Problem List) and allow for safe return to PLOF.       If plan is discharge home, recommend the following: A little help with walking and/or transfers;A little help with bathing/dressing/bathroom;Assistance with cooking/housework;Assist for transportation;Help with stairs or ramp for entrance   Can travel by private vehicle        Equipment Recommendations Rolling walker (2 wheels)  Recommendations for Other Services       Functional Status Assessment Patient has had a recent decline in their functional status and demonstrates the ability to make significant improvements in function in a reasonable and predictable amount of time.     Precautions / Restrictions Precautions Precautions: Fall Recall of Precautions/Restrictions: Impaired Restrictions Weight Bearing  Restrictions Per Provider Order: No      Mobility  Bed Mobility Overal bed mobility: Needs Assistance Bed Mobility: Supine to Sit, Sit to Supine     Supine to sit: Min assist, HOB elevated, Used rails Sit to supine: Min assist, HOB elevated, Used rails   General bed mobility comments: minA to assist with trunk elevation/repositioning    Transfers Overall transfer level: Needs assistance Equipment used: Rolling walker (2 wheels) Transfers: Sit to/from Stand Sit to Stand: Contact guard assist           General transfer comment: STS from EOB with RW    Ambulation/Gait Ambulation/Gait assistance: Contact guard assist Gait Distance (Feet): 150 Feet (over 3 bouts) Assistive device: Rolling walker (2 wheels) Gait Pattern/deviations: Step-through pattern, Wide base of support Gait velocity: decreased     General Gait Details: no LOB, R trunk lean throughout. 2 standing rest breaks encouraged to assess BP. VC for RW positioning  Stairs            Wheelchair Mobility     Tilt Bed    Modified Rankin (Stroke Patients Only)       Balance Overall balance assessment: Needs assistance Sitting-balance support: Feet supported Sitting balance-Leahy Scale: Fair Sitting balance - Comments: tremulous in sitting- requires at least unilateral UE support   Standing balance support: Bilateral upper extremity supported, Single extremity supported Standing balance-Leahy Scale: Poor Standing balance comment: increased postural sway with decreased UE support                             Pertinent Vitals/Pain Pain Assessment Pain Assessment: No/denies pain    Home Living Family/patient expects  to be discharged to:: Private residence Living Arrangements: Alone Available Help at Discharge: Available PRN/intermittently (two people from mental health organization?) Type of Home: House Home Access: Stairs to enter Entrance Stairs-Rails: None Entrance Stairs-Number  of Steps: 2 steps on side   Home Layout: One level Home Equipment: Rollator (4 wheels);Shower seat;Wheelchair - manual;Cane - single point Additional Comments: pt reports sliding glass door broke at home;    Prior Function Prior Level of Function : Needs assist;History of Falls (last six months);Patient poor historian/Family not available             Mobility Comments: SPC for going out of the home and PRN in house; using rollator for room to room,1-2 falls? ADLs Comments: difficulty with tub transfers, indep dressing, light meal prep, cleaning service, blister packs delivered for meds, 2 guys from [employer] help wiht transportation and groceries     Extremity/Trunk Assessment   Upper Extremity Assessment Upper Extremity Assessment: Generalized weakness    Lower Extremity Assessment Lower Extremity Assessment: Generalized weakness       Communication   Communication Communication: No apparent difficulties    Cognition Arousal: Alert Behavior During Therapy: WFL for tasks assessed/performed   PT - Cognitive impairments: No apparent impairments                       PT - Cognition Comments: A&Ox4 Following commands: Impaired Following commands impaired: Follows one step commands inconsistently, Follows one step commands with increased time     Cueing Cueing Techniques: Verbal cues, Visual cues     General Comments      Exercises Other Exercises Other Exercises: see vitals flowsheet for orthostatic vitals assessed during session   Assessment/Plan    PT Assessment Patient needs continued PT services  PT Problem List Decreased strength;Decreased activity tolerance;Decreased balance;Decreased mobility;Decreased coordination;Decreased safety awareness       PT Treatment Interventions DME instruction;Functional mobility training;Gait training;Stair training;Therapeutic activities;Therapeutic exercise;Balance training;Neuromuscular  re-education;Patient/family education;Cognitive remediation    PT Goals (Current goals can be found in the Care Plan section)  Acute Rehab PT Goals Patient Stated Goal: to go home PT Goal Formulation: With patient Time For Goal Achievement: 08/15/24 Potential to Achieve Goals: Fair    Frequency Min 2X/week     Co-evaluation               AM-PAC PT 6 Clicks Mobility  Outcome Measure Help needed turning from your back to your side while in a flat bed without using bedrails?: None Help needed moving from lying on your back to sitting on the side of a flat bed without using bedrails?: A Little Help needed moving to and from a bed to a chair (including a wheelchair)?: A Little Help needed standing up from a chair using your arms (e.g., wheelchair or bedside chair)?: A Little Help needed to walk in hospital room?: A Little Help needed climbing 3-5 steps with a railing? : A Little 6 Click Score: 19    End of Session Equipment Utilized During Treatment: Gait belt Activity Tolerance: Patient tolerated treatment well Patient left: in bed;with call bell/phone within reach;with bed alarm set Nurse Communication: Mobility status PT Visit Diagnosis: Unsteadiness on feet (R26.81);Other abnormalities of gait and mobility (R26.89);Muscle weakness (generalized) (M62.81);History of falling (Z91.81);Difficulty in walking, not elsewhere classified (R26.2)    Time: 8688-8658 PT Time Calculation (min) (ACUTE ONLY): 30 min   Charges:   PT Evaluation $PT Eval Low Complexity: 1 Low PT Treatments $Therapeutic  Activity: 23-37 mins PT General Charges $$ ACUTE PT VISIT: 1 Visit         Liza Czerwinski, SPT

## 2024-08-01 NOTE — Plan of Care (Signed)
  Problem: Education: Goal: Knowledge of condition and prescribed therapy will improve Outcome: Progressing   Problem: Physical Regulation: Goal: Complications related to the disease process, condition or treatment will be avoided or minimized Outcome: Progressing   Problem: Education: Goal: Knowledge of General Education information will improve Description: Including pain rating scale, medication(s)/side effects and non-pharmacologic comfort measures Outcome: Progressing   Problem: Clinical Measurements: Goal: Will remain free from infection Outcome: Progressing Goal: Cardiovascular complication will be avoided Outcome: Progressing   Problem: Safety: Goal: Ability to remain free from injury will improve Outcome: Progressing   Problem: Skin Integrity: Goal: Risk for impaired skin integrity will decrease Outcome: Progressing

## 2024-08-01 NOTE — Evaluation (Signed)
 Occupational Therapy Evaluation Patient Details Name: Dakota Harper MRN: [id number] DOB: 11/26/1945 Today's Date: 08/01/2024   History of Present Illness   78 y.o. male with medical history significant of hypertension, hyperlipidemia, hypothyroidism, CKD 3B, anemia, gout, COPD, BPH, pituitary adenoma status post decompression surgery, tremors, depression, obesity, OSA, orthostatic hypotension, chronic pain presenting with lightheadedness and a fall.     Clinical Impressions Pt was seen for OT evaluation this date. Prior to hospital admission, pt was living alone, reports using rollator for room to room household distances and primarily uses [facility] for out of the home. Endorses 6 falls. Pt generally independent with ADL (endorses tub transfers are challenging) and has assist for groceries from two people with [employer]. Pt endorses indep with light meal prep and has medications delivered in bubble packs. Pt lives alone and has difficulty identifying additional supports available. Pt presents with deficits in strength, balance, safety awareness, and problem solving, affecting safe and optimal ADL completion. Pt currently requires SBA-CGA for ADL transfers and short distance mobility with RW, requiring PRN VC for RW mgt for safety. Tends to leave RW to the side at times. Very unsteady on his feet. Pt strongly declined OT's direct presence in the bathroom while completing toileting in sitting and pericare in standing, requesting privacy despite education in purpose of OT's presence for safety. Pt denied lightheadedness throughout, declining orthostatic vitals at start of session. Pt would benefit from skilled OT services to address noted impairments and functional limitations (see below for any additional details) in order to maximize safety and independence while minimizing future risk of falls, injury, and readmission. Anticipate the need for follow up OT services upon acute hospital DC.    If  plan is discharge home, recommend the following:   A little help with walking and/or transfers;A little help with bathing/dressing/bathroom;Assistance with cooking/housework;Assist for transportation;Help with stairs or ramp for entrance;Supervision due to cognitive status;Direct supervision/assist for medications management;Direct supervision/assist for financial management     Functional Status Assessment   Patient has had a recent decline in their functional status and demonstrates the ability to make significant improvements in function in a reasonable and predictable amount of time.     Equipment Recommendations   Other (comment) (2WW)     Recommendations for Other Services         Precautions/Restrictions   Precautions Precautions: Fall Precaution/Restrictions Comments: hx orthostatic hypotension Restrictions Weight Bearing Restrictions Per Provider Order: No     Mobility Bed Mobility Overal bed mobility: Modified Independent                  Transfers Overall transfer level: Needs assistance Equipment used: Rolling walker (2 wheels) Transfers: Sit to/from Stand Sit to Stand: Contact guard assist, Supervision           General transfer comment: unsteady, cues for RW placement      Balance Overall balance assessment: Needs assistance Sitting-balance support: No upper extremity supported, Feet supported, Single extremity supported Sitting balance-Leahy Scale: Fair     Standing balance support: Bilateral upper extremity supported, Reliant on assistive device for balance Standing balance-Leahy Scale: Poor                             ADL either performed or assessed with clinical judgement   ADL Overall ADL's : Needs assistance/impaired  General ADL Comments: Pt did not require any direct physical assist but demo'd difficulty and poor safety with toileting and grooming at sink.  Pt refused to allow OT to be present while completing standing pericare/clothing mgt after toileting despite education in safety/risks/falls prevention. Pt benefits from supervision for safety with ADL mobility and all ADL involving transitional movements     Vision         Perception         Praxis         Pertinent Vitals/Pain Pain Assessment Pain Assessment: No/denies pain     Extremity/Trunk Assessment Upper Extremity Assessment Upper Extremity Assessment: Generalized weakness   Lower Extremity Assessment Lower Extremity Assessment: Generalized weakness       Communication Communication Communication: No apparent difficulties   Cognition Arousal: Alert Behavior During Therapy: Impulsive Cognition: No family/caregiver present to determine baseline             OT - Cognition Comments: alert and oriented, poor safety awareness and awareness of deficits                 Following commands: Impaired Following commands impaired: Follows one step commands inconsistently, Follows one step commands with increased time     Cueing  General Comments   Cueing Techniques: Verbal cues;Gestural cues;Tactile cues      Exercises     Shoulder Instructions      Home Living Family/patient expects to be discharged to:: Private residence Living Arrangements: Alone Available Help at Discharge: Available PRN/intermittently (two people from mental health organization?) Type of Home: House Home Access: Stairs to enter [employer] of Steps: 2 steps on side   Home Layout: One level     Bathroom Shower/Tub: Chief Strategy Officer: Standard     Home Equipment: Rollator (4 wheels);Shower seat;Wheelchair - manual;Cane - single point   Additional Comments: pt reports sliding glass door broke at home;      Prior Functioning/Environment Prior Level of Function : Needs assist;History of Falls (last six months);Patient poor historian/Family not  available             Mobility Comments: SPC for going out of the home and PRN in house; using rollator for room to room,1-2 falls? ADLs Comments: difficulty with tub transfers, indep dressing, light meal prep, cleaning service, blister packs delivered for meds, 2 guys from [employer] help wiht transportation and groceries    OT Problem List: Decreased safety awareness;Decreased strength;Impaired balance (sitting and/or standing);Decreased knowledge of use of DME or AE;Cardiopulmonary status limiting activity   OT Treatment/Interventions: Self-care/ADL training;Therapeutic exercise;Therapeutic activities;DME and/or AE instruction;Patient/family education;Balance training;Cognitive remediation/compensation      OT Goals(Current goals can be found in the care plan section)   Acute Rehab OT Goals Patient Stated Goal: go home OT Goal Formulation: With patient Time For Goal Achievement: 08/15/24 Potential to Achieve Goals: Good ADL Goals Pt Will Transfer to Toilet: with modified independence;ambulating (LRAD) Pt Will Perform Toileting - Clothing Manipulation and hygiene: with modified independence;sit to/from stand;sitting/lateral leans Additional ADL Goal #1: Pt will complete all aspects of a shower, primarily from sitting, wiht mod indep, 1/1 opportunity.   OT Frequency:  Min 2X/week    Co-evaluation              AM-PAC OT 6 Clicks Daily Activity     Outcome Measure Help from another person eating meals?: None Help from another person taking care of personal grooming?: A Little Help from another person toileting,  which includes using toliet, bedpan, or urinal?: A Little Help from another person bathing (including washing, rinsing, drying)?: A Little Help from another person to put on and taking off regular upper body clothing?: A Little Help from another person to put on and taking off regular lower body clothing?: A Little 6 Click Score: 19   End of Session  Equipment Utilized During Treatment: Rolling walker (2 wheels) Nurse Communication: Mobility status  Activity Tolerance: Patient tolerated treatment well Patient left: in bed;with call bell/phone within reach;with bed alarm set  OT Visit Diagnosis: Other abnormalities of gait and mobility (R26.89);Repeated falls (R29.6);Muscle weakness (generalized) (M62.81)                Time: 1010-1032 OT Time Calculation (min): 22 min Charges:  OT General Charges $OT Visit: 1 Visit OT Evaluation $OT Eval Low Complexity: 1 Low OT Treatments $Self Care/Home Management : 8-22 mins  Warren SAUNDERS., MPH, MS, OTR/L ascom [phone] 08/01/24, 2:25 PM

## 2024-08-01 NOTE — Progress Notes (Signed)
 [facility]  - [place] [facility]   PATIENT NAME: Dakota Harper    MR#:  [id number]  DATE OF BIRTH:  1945/12/12  SUBJECTIVE:      VITALS:  Blood pressure (!) 155/77, pulse 75, temperature 98 F (36.7 C), resp. rate 18, height 5' 8 (1.727 m), weight 87.1 kg, SpO2 99%.  PHYSICAL EXAMINATION:   GENERAL:  78 y.o.-year-old patient with no acute distress.  LUNGS: Normal breath sounds bilaterally, no wheezing CARDIOVASCULAR: S1, S2 normal. No murmur   ABDOMEN: Soft, nontender, nondistended. Bowel sounds present.  EXTREMITIES: No  edema b/l.    NEUROLOGIC: nonfocal  patient is alert and awake SKIN: No obvious rash, lesion, or ulcer.   LABORATORY PANEL:  CBC Recent Labs  Lab 08/01/24 0453  WBC 7.1  HGB 13.4  HCT 39.0  PLT 236    Chemistries  Recent Labs  Lab 08/01/24 0453  NA 143  K 4.1  CL 106  CO2 28  GLUCOSE 95  BUN 21  CREATININE 1.60*  CALCIUM  8.8*  AST 16  ALT 11  ALKPHOS 44  BILITOT 0.8   Cardiac Enzymes No results for input(s): TROPONINI in the last 168 hours. RADIOLOGY:  CT Head Wo Contrast Result Date: 07/31/2024 EXAM: CT HEAD AND CERVICAL SPINE 07/31/2024 03:58:27 PM TECHNIQUE: CT of the head and cervical spine was performed without the administration of intravenous contrast. Multiplanar reformatted images are provided for review. Automated exposure control, iterative reconstruction, and/or weight based adjustment of the mA/kV was utilized to reduce the radiation dose to as low as reasonably achievable. COMPARISON: 06/14/2023 CLINICAL HISTORY: Head trauma, minor (Age >= 65y). First Nurse Note: Patient to ED via [facility] from home for fall due to dizziness. Denies LOC or blood thinners. Hit head on hardwood floor. Denies pain but still feeling dizzy. +orthostatic. FINDINGS: CT HEAD BRAIN AND VENTRICLES: No acute intracranial hemorrhage. No mass effect or midline shift. No abnormal extra-axial fluid collection. No evidence of acute  infarct. No hydrocephalus. Mild age-related atrophy. Subcortical and periventricular small vessel ischemic changes. Mild intracranial atherosclerosis. ORBITS: No acute abnormality. SINUSES AND MASTOIDS: No acute abnormality. SOFT TISSUES AND SKULL: No acute skull fracture. No acute soft tissue abnormality. CT CERVICAL SPINE BONES AND ALIGNMENT: No acute fracture. 3 mm anterolisthesis of C3 on C4. No traumatic malalignment. DEGENERATIVE CHANGES: Mild degenerative changes of the mid cervical spine, most prominent at C4-5 and C6-7. SOFT TISSUES: No prevertebral soft tissue swelling. IMPRESSION: 1. No acute intracranial abnormality. 2. No acute traumatic injury to the cervical spine. Electronically signed by: Pinkie Pebbles MD 07/31/2024 05:09 PM EDT RP Workstation: [id number]   CT Cervical Spine Wo Contrast Result Date: 07/31/2024 EXAM: CT HEAD AND CERVICAL SPINE 07/31/2024 03:58:27 PM TECHNIQUE: CT of the head and cervical spine was performed without the administration of intravenous contrast. Multiplanar reformatted images are provided for review. Automated exposure control, iterative reconstruction, and/or weight based adjustment of the mA/kV was utilized to reduce the radiation dose to as low as reasonably achievable. COMPARISON: 06/14/2023 CLINICAL HISTORY: Head trauma, minor (Age >= 65y). First Nurse Note: Patient to ED via [facility] from home for fall due to dizziness. Denies LOC or blood thinners. Hit head on hardwood floor. Denies pain but still feeling dizzy. +orthostatic. FINDINGS: CT HEAD BRAIN AND VENTRICLES: No acute intracranial hemorrhage. No mass effect or midline shift. No abnormal extra-axial fluid collection. No evidence of acute infarct. No hydrocephalus. Mild age-related atrophy. Subcortical and periventricular small vessel ischemic changes. Mild intracranial atherosclerosis. ORBITS:  No acute abnormality. SINUSES AND MASTOIDS: No acute abnormality. SOFT TISSUES AND SKULL: No acute skull fracture.  No acute soft tissue abnormality. CT CERVICAL SPINE BONES AND ALIGNMENT: No acute fracture. 3 mm anterolisthesis of C3 on C4. No traumatic malalignment. DEGENERATIVE CHANGES: Mild degenerative changes of the mid cervical spine, most prominent at C4-5 and C6-7. SOFT TISSUES: No prevertebral soft tissue swelling. IMPRESSION: 1. No acute intracranial abnormality. 2. No acute traumatic injury to the cervical spine. Electronically signed by: Pinkie Pebbles MD 07/31/2024 05:09 PM EDT RP Workstation: [id number]    Assessment and Plan  Dakota Harper is a 78 y.o. male with medical history significant of hypertension, hyperlipidemia, hypothyroidism, CKD 3B, anemia, gout, COPD, BPH, pituitary adenoma status post decompression surgery, tremors, depression, obesity, OSA, orthostatic hypotension, chronic pain presenting with lightheadedness and a fall.   CT head and CT C-spine showed no acute abnormality.  Near syncope - Patient presenting with lightheaded episode where he nearly passed out.  Did fall but did not lose consciousness. --Additionally reports frequent lightheadedness -- CT head and CT C-spine are negative in ED. --Some history of orthostatic hypotension and has been on Florinef  ever since he had issues with hypotension in 2023.  This is in the setting of pituitary adenoma status post decompression surgery. 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  However no high-grade block on EKG today. -- Hold off on repeat echocardiogram as this was recently done --noted to have orthostatic hypotension--will start low dose midodrine . Pt ambulated well with PT  History of pituitary adenoma Hypothyroidism --As per HPI does have history of pituitary adenoma status post decompressive surgery.  -- Has hypothyroidism for which he takes Synthroid .  Previously on hydrocortisone  and has been on Florinef  since  2023 when he had issues with hypotension. --Follows with nephrology outpatient. Last seen in July of this year with no medication adjustments indicated at that time. - Continue with Synthroid  - Continue home Florinef , if workup otherwise negative may need to increase frequency/dose   Hypertension --States he stopped taking his amlodipine  about 2 months ago. - Continue to Hold amlodipine   --on losartan    Hyperlipidemia - Continue home atorvastatin , fenofibrate    CKD 3B -- Creatinine stable in the ED  Anemia -- Normal hemoglobin in the ED  COPD - Continue home Breo, as needed albuterol , Singulair    BPH - Continue home tamsulosin , finasteride  - Replace home Gemtesa  formulary Myrbetriq    Depression --History of SI and self-harm behavior. - Continue home Depakote , nortriptyline  - Confirm he continues take paliperidone     Obesity - Noted   OSA - Continue CPAP    Family communication :none Consults :none CODE STATUS: full DVT Prophylaxis :lovneox Level of care: Telemetry Medical Status is: Observation The patient remains OBS appropriate and will d/c before 2 midnights.    TOTAL TIME TAKING CARE OF THIS PATIENT: 35 minutes.  >50% time spent on counselling and coordination of care  Note: This dictation was prepared with Dragon dictation along with smaller phrase technology. Any transcriptional errors that result from this process are unintentional.  Leita Blanch M.D    [facility]   CC: Primary care physician; [facility], [facility]

## 2024-08-02 DIAGNOSIS — R55 Syncope and collapse: Secondary | ICD-10-CM | POA: Diagnosis not present

## 2024-08-02 LAB — GLUCOSE, CAPILLARY: Glucose-Capillary: 94 mg/dL (ref 70–99)

## 2024-08-02 MED ORDER — MIDODRINE HCL 5 MG PO TABS: 2.5000 mg | ORAL_TABLET | Freq: Two times a day (BID) | ORAL | Status: DC

## 2024-08-02 MED ORDER — MIDODRINE HCL 2.5 MG PO TABS: 2.5000 mg | ORAL_TABLET | Freq: Two times a day (BID) | ORAL | 0 refills | Status: AC | PRN

## 2024-08-02 MED ORDER — MIDODRINE HCL 5 MG PO TABS: 2.5000 mg | ORAL_TABLET | Freq: Two times a day (BID) | ORAL | Status: DC | PRN

## 2024-08-02 NOTE — Plan of Care (Signed)
  Problem: Education: Goal: Knowledge of condition and prescribed therapy will improve Outcome: Progressing   Problem: Cardiac: Goal: Will achieve and/or maintain adequate cardiac output Outcome: Progressing   Problem: Physical Regulation: Goal: Complications related to the disease process, condition or treatment will be avoided or minimized Outcome: Progressing   Problem: Education: Goal: Knowledge of General Education information will improve Description: Including pain rating scale, medication(s)/side effects and non-pharmacologic comfort measures Outcome: Progressing   Problem: Health Behavior/Discharge Planning: Goal: Ability to manage health-related needs will improve Outcome: Progressing   Problem: Clinical Measurements: Goal: Ability to maintain clinical measurements within normal limits will improve Outcome: Progressing Goal: Will remain free from infection Outcome: Progressing Goal: Diagnostic test results will improve Outcome: Progressing Goal: Respiratory complications will improve Outcome: Progressing Goal: Cardiovascular complication will be avoided Outcome: Progressing

## 2024-08-02 NOTE — Discharge Summary (Signed)
 Physician Discharge Summary   Patient: Dakota Harper MRN: [id number] DOB: 1946/02/21  Admit date:     07/31/2024  Discharge date: 08/02/24  Discharge Physician: Leita Blanch   PCP: [facility]   Recommendations at discharge:    F/u PCP In 1-2 weeks Keep log of BP at home Use TEDS /stocking to help avoid orthostatic bp  Discharge Diagnoses: Principal Problem:   Near syncope Active Problems:   Orthostatic hypotension   Hypertension, benign   Stage 3b chronic kidney disease (CKD) (HCC)   BPH (benign prostatic hyperplasia)   Hypothyroidism   OSA (obstructive sleep apnea)   Tremors of nervous system   COPD with chronic bronchitis (HCC)   Chronic pain syndrome   Gout   Hypercholesterolemia   Obesity (BMI 35.0-39.9 without comorbidity)   Pituitary adenoma (HCC)   Recurrent depressive disorder   Normocytic anemia   Dakota Harper is a 78 y.o. male with medical history significant of hypertension, hyperlipidemia, hypothyroidism, CKD 3B, anemia, gout, COPD, BPH, pituitary adenoma status post decompression surgery, tremors, depression, obesity, OSA, orthostatic hypotension, chronic pain presenting with lightheadedness and a fall.    CT head and CT C-spine showed no acute abnormality.   Near syncope - Patient presenting with lightheaded episode where he nearly passed out.  Did fall but did not lose consciousness. --Additionally reports frequent lightheadedness -- CT head and CT C-spine are negative in ED. --Some history of orthostatic hypotension and has been on Florinef  ever since he had issues with hypotension in 2023.  This is in the setting of pituitary adenoma status post decompression surgery. 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  However no high-grade block on EKG today. -- Hold off on repeat echocardiogram as this was recently done --noted to  have orthostatic hypotension--will start low dose midodrine  prn. Pt ambulated well with PT around the nurses station using RW. Has Rollator at home   History of pituitary adenoma Hypothyroidism --As per HPI does have history of pituitary adenoma status post decompressive surgery.  -- Has hypothyroidism for which he takes Synthroid .  Previously on hydrocortisone  and has been on Florinef  since 2023 when he had issues with hypotension. --Follows with nephrology outpatient. Last seen in July of this year with no medication adjustments indicated at that time. - Continue with Synthroid  - Continue home Florinef , if workup otherwise negative may need to increase frequency/dose   Hypertension, uncontrolled --on losartan    Hyperlipidemia - Continue home atorvastatin , fenofibrate    CKD 3B -- Creatinine stable in the ED   Anemia -- Normal hemoglobin in the ED   COPD - Continue home Breo, as needed albuterol , Singulair    BPH - Continue home tamsulosin , finasteride  - on Gemtesa    Depression --History of SI and self-harm behavior. - Continue home Depakote , nortriptyline  - pt continues take paliperidone     Obesity - Noted   OSA - Continue CPAP  Overall feels better. Eager to go home D/C home with [facility]     Family communication :none Consults :none CODE STATUS: full DVT Prophylaxis :lovneox     Disposition: Home health Diet recommendation:  Discharge Diet Orders (From admission, onward)     Start     Ordered   08/02/24 0000  Diet - low sodium heart healthy        08/02/24 0908           Cardiac diet DISCHARGE MEDICATION: Allergies as  of 08/02/2024       Reactions   Saw Palmetto Hives, Rash, Dermatitis   Berry form saw palmetto extract   Sulfacetamide Sodium Dermatitis   Other Other (See Comments), Rash   Sulfa Antibiotics Rash, Dermatitis   Substance with sulfonamide structure and antibacterial mechanism of action (substance)   Sulfacetamide Sodium Rash         Medication List     TAKE these medications    acetaminophen  500 MG tablet Commonly known as: TYLENOL  Take 500 mg by mouth every 4 (four) hours as needed for moderate pain or mild pain.   albuterol  108 (90 Base) MCG/ACT inhaler Commonly known as: VENTOLIN  HFA Inhale 1 puff into the lungs 3 (three) times daily as needed for wheezing or shortness of breath.   aspirin  EC 81 MG tablet Take 81 mg by mouth at bedtime. (2100)   atorvastatin  10 MG tablet Commonly known as: LIPITOR Take 10 mg by mouth every evening. (2000)   Breo Ellipta  200-25 MCG/INH Aepb Generic drug: fluticasone  furoate-vilanterol Inhale 1 puff into the lungs daily. (0800)   cycloSPORINE  0.05 % ophthalmic emulsion Commonly known as: RESTASIS  Place 1 drop into both eyes 2 (two) times daily as needed (dry eyes).   diclofenac  Sodium 1 % Gel Commonly known as: VOLTAREN  Apply 2 g topically as needed (lower left leg pain.).   divalproex  250 MG DR tablet Commonly known as: DEPAKOTE  Take 250 mg by mouth 2 (two) times daily.   Dupixent  300 MG/2ML Soaj Generic drug: Dupilumab  Inject 300 mg into the skin every 14 (fourteen) days. Starting at day 15 for maintenance.   Ear Wax Removal Drops 6.5 % OTIC solution Generic drug: carbamide peroxide Place 5 drops into both ears every Sunday.   fenofibrate  54 MG tablet Take 1 tablet (54 mg total) by mouth daily.   finasteride  5 MG tablet Commonly known as: PROSCAR  Take 1 tablet (5 mg total) by mouth daily.   fludrocortisone  0.1 MG tablet Commonly known as: FLORINEF  Take 100 mcg by mouth daily.   Fluocinolone  Acetonide Body 0.01 % Oil Apply to scalp 1-2 times a day and leave on.   fluocinonide  ointment 0.05 % Commonly known as: LIDEX  Apply 1 Application topically 2 (two) times daily as needed (pruritus). (apply to legs)   Gemtesa  75 MG Tabs Generic drug: Vibegron  Take 1 tablet (75 mg total) by mouth daily.   levothyroxine  50 MCG tablet Commonly known as:  SYNTHROID  Take 50 mcg by mouth daily before breakfast. (0630)   losartan  25 MG tablet Commonly known as: COZAAR  Take 25 mg by mouth daily.   midodrine  2.5 MG tablet Commonly known as: PROAMATINE  Take 1 tablet (2.5 mg total) by mouth 2 (two) times daily as needed (for SBP <120).   montelukast  5 MG chewable tablet Commonly known as: SINGULAIR  Chew 5 mg by mouth at bedtime. (2100)   nortriptyline  75 MG capsule Commonly known as: PAMELOR  Take 150 mg by mouth daily. (0900)   omeprazole  40 MG capsule Commonly known as: PRILOSEC Take 1 capsule (40 mg total) by mouth daily.   tacrolimus  0.1 % ointment Commonly known as: PROTOPIC  Apply topically 2 (two) times daily.   tamsulosin  0.4 MG Caps capsule Commonly known as: FLOMAX  Take 0.4 mg by mouth at bedtime. (2100)   tiZANidine  2 MG tablet Commonly known as: ZANAFLEX  Take 2 mg by mouth at bedtime. (2000)               Durable Medical Equipment  (From admission,  onward)           Start     Ordered   08/01/24 1246  For home use only DME Walker rolling  Once       Question Answer Comment  Walker: With 5 Inch Wheels   Patient needs a walker to treat with the following condition Weakness generalized      08/01/24 1245            Follow-up Information     [facility]. Schedule an appointment as soon as possible for a visit in 1 week(s).   Contact information: [address] [place] [address] [phone]                 Filed Weights   07/31/24 1259 08/01/24 0500 08/02/24 0434  Weight: 92.5 kg 87.1 kg 86.8 kg     Condition at discharge: fair  The results of significant diagnostics from this hospitalization (including imaging, microbiology, ancillary and laboratory) are listed below for reference.   Imaging Studies: CT Head Wo Contrast Result Date: 07/31/2024 EXAM: CT HEAD AND CERVICAL SPINE 07/31/2024 03:58:27 PM TECHNIQUE: CT of the head and cervical spine was  performed without the administration of intravenous contrast. Multiplanar reformatted images are provided for review. Automated exposure control, iterative reconstruction, and/or weight based adjustment of the mA/kV was utilized to reduce the radiation dose to as low as reasonably achievable. COMPARISON: 06/14/2023 CLINICAL HISTORY: Head trauma, minor (Age >= 65y). First Nurse Note: Patient to ED via [facility] from home for fall due to dizziness. Denies LOC or blood thinners. Hit head on hardwood floor. Denies pain but still feeling dizzy. +orthostatic. FINDINGS: CT HEAD BRAIN AND VENTRICLES: No acute intracranial hemorrhage. No mass effect or midline shift. No abnormal extra-axial fluid collection. No evidence of acute infarct. No hydrocephalus. Mild age-related atrophy. Subcortical and periventricular small vessel ischemic changes. Mild intracranial atherosclerosis. ORBITS: No acute abnormality. SINUSES AND MASTOIDS: No acute abnormality. SOFT TISSUES AND SKULL: No acute skull fracture. No acute soft tissue abnormality. CT CERVICAL SPINE BONES AND ALIGNMENT: No acute fracture. 3 mm anterolisthesis of C3 on C4. No traumatic malalignment. DEGENERATIVE CHANGES: Mild degenerative changes of the mid cervical spine, most prominent at C4-5 and C6-7. SOFT TISSUES: No prevertebral soft tissue swelling. IMPRESSION: 1. No acute intracranial abnormality. 2. No acute traumatic injury to the cervical spine. Electronically signed by: Pinkie Pebbles MD 07/31/2024 05:09 PM EDT RP Workstation: [id number]   CT Cervical Spine Wo Contrast Result Date: 07/31/2024 EXAM: CT HEAD AND CERVICAL SPINE 07/31/2024 03:58:27 PM TECHNIQUE: CT of the head and cervical spine was performed without the administration of intravenous contrast. Multiplanar reformatted images are provided for review. Automated exposure control, iterative reconstruction, and/or weight based adjustment of the mA/kV was utilized to reduce the radiation dose to as low as  reasonably achievable. COMPARISON: 06/14/2023 CLINICAL HISTORY: Head trauma, minor (Age >= 65y). First Nurse Note: Patient to ED via [facility] from home for fall due to dizziness. Denies LOC or blood thinners. Hit head on hardwood floor. Denies pain but still feeling dizzy. +orthostatic. FINDINGS: CT HEAD BRAIN AND VENTRICLES: No acute intracranial hemorrhage. No mass effect or midline shift. No abnormal extra-axial fluid collection. No evidence of acute infarct. No hydrocephalus. Mild age-related atrophy. Subcortical and periventricular small vessel ischemic changes. Mild intracranial atherosclerosis. ORBITS: No acute abnormality. SINUSES AND MASTOIDS: No acute abnormality. SOFT TISSUES AND SKULL: No acute skull fracture. No acute soft tissue abnormality. CT CERVICAL SPINE BONES AND ALIGNMENT: No  acute fracture. 3 mm anterolisthesis of C3 on C4. No traumatic malalignment. DEGENERATIVE CHANGES: Mild degenerative changes of the mid cervical spine, most prominent at C4-5 and C6-7. SOFT TISSUES: No prevertebral soft tissue swelling. IMPRESSION: 1. No acute intracranial abnormality. 2. No acute traumatic injury to the cervical spine. Electronically signed by: Pinkie Pebbles MD 07/31/2024 05:09 PM EDT RP Workstation: [id number]   DG Chest Port 1 View Result Date: 07/09/2024 CLINICAL DATA:  Weakness, COPD EXAM: PORTABLE CHEST 1 VIEW COMPARISON:  None Available. FINDINGS: The heart size and mediastinal contours are within normal limits. Both lungs are clear. The visualized skeletal structures are unremarkable. IMPRESSION: No active disease. Electronically Signed   By: Dorethia Molt M.D.   On: 07/09/2024 23:57    Microbiology: Results for orders placed or performed during the hospital encounter of 07/31/24  Resp panel by RT-PCR (RSV, Flu A&B, Covid) Anterior Nasal Swab     Status: None   Collection Time: 07/31/24  3:49 PM   Specimen: Anterior Nasal Swab  Result Value Ref Range Status   SARS Coronavirus 2 by RT PCR  NEGATIVE NEGATIVE Final    Comment: (NOTE) SARS-CoV-2 target nucleic acids are NOT DETECTED.  The SARS-CoV-2 RNA is generally detectable in upper respiratory specimens during the acute phase of infection. The lowest concentration of SARS-CoV-2 viral copies this assay can detect is 138 copies/mL. A negative result does not preclude SARS-Cov-2 infection and should not be used as the sole basis for treatment or other patient management decisions. A negative result may occur with  improper specimen collection/handling, submission of specimen other than nasopharyngeal swab, presence of viral mutation(s) within the areas targeted by this assay, and inadequate number of viral copies(<138 copies/mL). A negative result must be combined with clinical observations, patient history, and epidemiological information. The expected result is Negative.  Fact Sheet for Patients:  BloggerCourse.com  Fact Sheet for Healthcare Providers:  SeriousBroker.it  This test is no t yet approved or cleared by the [place]  FDA and  has been authorized for detection and/or diagnosis of SARS-CoV-2 by FDA under an Emergency Use Authorization (EUA). This EUA will remain  in effect (meaning this test can be used) for the duration of the COVID-19 declaration under Section 564(b)(1) of the Act, 21 U.S.C.section 360bbb-3(b)(1), unless the authorization is terminated  or revoked sooner.       Influenza A by PCR NEGATIVE NEGATIVE Final   Influenza B by PCR NEGATIVE NEGATIVE Final    Comment: (NOTE) The Xpert Xpress SARS-CoV-2/FLU/RSV plus assay is intended as an aid in the diagnosis of influenza from Nasopharyngeal swab specimens and should not be used as a sole basis for treatment. Nasal washings and aspirates are unacceptable for Xpert Xpress SARS-CoV-2/FLU/RSV testing.  Fact Sheet for Patients: BloggerCourse.com  Fact Sheet for  Healthcare Providers: SeriousBroker.it  This test is not yet approved or cleared by the [place]  FDA and has been authorized for detection and/or diagnosis of SARS-CoV-2 by FDA under an Emergency Use Authorization (EUA). This EUA will remain in effect (meaning this test can be used) for the duration of the COVID-19 declaration under Section 564(b)(1) of the Act, 21 U.S.C. section 360bbb-3(b)(1), unless the authorization is terminated or revoked.     Resp Syncytial Virus by PCR NEGATIVE NEGATIVE Final    Comment: (NOTE) Fact Sheet for Patients: BloggerCourse.com  Fact Sheet for Healthcare Providers: SeriousBroker.it  This test is not yet approved or cleared by the [place]  FDA and has been authorized for detection  and/or diagnosis of SARS-CoV-2 by FDA under an Emergency Use Authorization (EUA). This EUA will remain in effect (meaning this test can be used) for the duration of the COVID-19 declaration under Section 564(b)(1) of the Act, 21 U.S.C. section 360bbb-3(b)(1), unless the authorization is terminated or revoked.  Performed at [facility], [address] [place], [place] [address]     Labs: CBC: Recent Labs  Lab 07/31/24 1303 08/01/24 0453  WBC 7.4 7.1  HGB 13.5 13.4  HCT 39.9 39.0  MCV 91.9 91.5  PLT 243 236   Basic Metabolic Panel: Recent Labs  Lab 07/31/24 1303 08/01/24 0453  NA 140 143  K 3.9 4.1  CL 105 106  CO2 26 28  GLUCOSE 93 95  BUN 20 21  CREATININE 1.25* 1.60*  CALCIUM  8.8* 8.8*   Liver Function Tests: Recent Labs  Lab 07/31/24 1303 08/01/24 0453  AST 25 16  ALT 12 11  ALKPHOS 43 44  BILITOT 0.6 0.8  PROT 5.7* 5.8*  ALBUMIN 3.2* 2.9*   CBG: Recent Labs  Lab 08/02/24 0446  GLUCAP 94    Discharge time spent: greater than 30 minutes.  Signed: Leita Blanch, MD [facility] 08/02/2024

## 2024-08-02 NOTE — Discharge Instructions (Addendum)
 Keep log of BP at home  Home Health Services set up with [facility]. Start of care will be 24 to 48 hours after discharge.

## 2024-08-02 NOTE — TOC Transition Note (Signed)
 Transition of Care [facility]) - Discharge Note   Patient Details  Name: Dakota Harper MRN: [id number] Date of Birth: 1946-02-07  [facility] CM/SW Contact:  [place] L Daija Routson, LCSW Phone Number: 08/02/2024, 10:08 AM   Clinical Narrative:      CSW met with patient. No family at bedside. Recommendations for Home Health has been discussed. Patient advised that he had HHA in the past but could not recall the agency he used. He was agreeable to CSW selecting an agency that accepts his insurance.   DME discussed. Patient advised that he has a RW and a cane he uses in the home. Patient declined any further DME.   In regard to transportation, patient advised that he will call an [employer] or a [employer].   CSW contacted [facility] and spoke with Channing. Patient will receive services 24 to 48 hours after discharge. Discharge Instructions have been added to the AVS.   TOC signing off.        Patient Goals and CMS Choice            Discharge Placement                       Discharge Plan and Services Additional resources added to the After Visit Summary for                                       Social Drivers of Health (SDOH) Interventions SDOH Screenings   Food Insecurity: No Food Insecurity (07/31/2024)  Housing: Unknown (07/31/2024)  Transportation Needs: No Transportation Needs (07/31/2024)  Utilities: Not At Risk (07/31/2024)  Financial Resource Strain: Low Risk  (04/15/2024)   Received from [facility]  Social Connections: Unknown (07/31/2024)  Tobacco Use: Medium Risk (07/09/2024)     Readmission Risk Interventions     No data to display

## 2024-08-07 ENCOUNTER — Telehealth: Payer: Self-pay

## 2024-08-07 NOTE — Telephone Encounter (Signed)
 Patient left nurse VM that he wants to d/c dupixent  due to no improvement.  Called patient back but no answer and left detailed VM. Advised patient if he wants to stop medication he can hold until his follow up with Dr. Claudene on 08/25/24. aw

## 2024-08-11 ENCOUNTER — Ambulatory Visit: Admitting: Dermatology

## 2024-08-14 NOTE — Progress Notes (Signed)
 Dakota Harper is a 78 y.o. male seen in follow up. He was previously here in 05/2024. At that time, he was advised to see me back in a year. Today he returns early to ask me about his medications. He asks why he is taking losartan , fludrocortisone , and midodrine . These were not prescribed by Endocrinology. It seems he got these medications either from Hospital [facility] at [facility] or from his primary care provider.    History: Pituitary macroadenoma. He was previously followed at [facility] Endocrinology clinic in 2015. In about 2014 an MRI of the brain showed a lobulated sellar/suprasellar mass measuring 2.2 cm and abutting the optic nerve.  He saw a neuro-ophthalmologist who did not find a visual field deficit.  He underwent decompression surgery at [facility] in 10/2013.  Postoperatively, he was treated with levothyroxine  50 mcg a day and hydrocortisone .  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  There were no other deficiencies.  Pathology stained positive for synaptophysin and negative for ACTH , GH and prolactin.  He had no endocrine follow-up from 2015 - 02/2024. In 02/2024, he established here with Dr Standley. She obtained labs including ACTH , cortisol, prolactin, IGF-1, testosterone, TSH and Free T4. Labs were normal. She obtained MRI pituitary which was completed on 04/15/2024 at [facility]. That report is included below and was notable for a questionable area in the pituitary measuring up to 1 cm which may be a pituitary adenoma.   Hypothyroidism. He continues to take levothyroxine  for hypothyroidism.    Past Medical History:  Diagnosis Date  . Actinic keratosis   . BPH with obstruction/lower urinary tract symptoms   . Dysplastic nevus   . Hyperlipidemia   . Hypertension   . Hypothyroidism   . Migraines   . Squamous cell carcinoma in situ    Outpatient Medications Marked as Taking for the 08/14/24 encounter (Office Visit) with Solum, Anna Melissa, MD  Medication Sig Dispense Refill  .  acetaminophen  (TYLENOL ) 325 MG tablet Take 500 mg by mouth every 4 (four) hours as needed    . albuterol  90 mcg/actuation inhaler     . aspirin  81 MG EC tablet Take 81 mg by mouth once daily    . atorvastatin  (LIPITOR) 10 MG tablet Take 10 mg by mouth once daily    . BREO ELLIPTA  200-25 mcg/dose DsDv     . chlorhexidine  (PERIDEX ) 0.12 % solution     . cycloSPORINE  (RESTASIS ) 0.05 % ophthalmic emulsion     . divalproex  (DEPAKOTE ) 250 MG DR tablet Take 250 mg by mouth 2 (two) times daily    . finasteride  (PROSCAR ) 5 mg tablet Take 5 mg by mouth once daily    . fludrocortisone  (FLORINEF ) 0.1 mg tablet Take 0.1 mg by mouth once daily    . levothyroxine  (SYNTHROID , LEVOTHROID) 50 MCG tablet Take 50 mcg by mouth once daily Take on an empty stomach with a glass of water at least 30-60 minutes before breakfast.    . loperamide  (IMODIUM ) 2 mg capsule Take by mouth    . montelukast  (SINGULAIR ) 5 MG chewable tablet Take 5 mg by mouth at bedtime    . multivitamin tablet Take 1 tablet by mouth once daily    . nortriptyline  (PAMELOR ) 75 MG capsule Take 150 mg by mouth at bedtime Taking 150mg  ngihtly    . omeprazole  (PRILOSEC) 10 MG DR capsule Take 40 mg by mouth once daily    . paliperidone  (INVEGA ) 3 MG 24 hr tablet Take 3 mg by  mouth every morning    . permethrin  (ELIMITE ) 5 % cream Apply topically once Apply from head to soles of feet; remove by washing after 8 - 14 hrs.    . polyethylene glycol (MIRALAX ) powder Take by mouth    . polyethylene glycol (MIRALAX ) powder Take as directed for colonic prep. 238 g 0  . tamsulosin  (FLOMAX ) 0.4 mg capsule Take 0.4 mg by mouth once daily Take 30 minutes after same meal each day.    . tiZANidine  (ZANAFLEX ) 2 MG tablet Take 2 mg by mouth at bedtime    . valproic  acid (DEPAKENE ) 250 mg capsule Take 250 mg by mouth 2 (two) times daily    . vibegron  (GEMTESA ) 75 mg Tab Take 75 mg by mouth once daily      Physical Exam BP 130/76   Pulse 80   Ht 172.7 cm (5' 8)    Wt 86.3 kg (190 lb 3.2 oz)   SpO2 99%   BMI 28.92 kg/m   GEN: well developed, well nourished male, in NAD. PSYC: alert and oriented, good insight  Assessment Pituitary Adenoma Hypothyroidism  Plan - Counseled him about use of losartan  for hypertension and midodrine  and fludrocortisone  for orthostatic hypotension. Counseled him that as these were not prescribed by Endocrinology, I cannot explain circumstance as to why they were started and when, however he was encouraged to discuss with his primary care provider if they need to be continued or not. - MRI scan suggested possible 1 cm pituitary adenoma. Last imaging of pituitary was >10 yrs ago so no recent images available to compare. Pituitary adenoma is likely non-functional. It would be reasonable to wait another 3-5 yrs before reiimaging to assess for growth. No evidence of pituitary hormonal deficits at this time.  - Hypothyroidism to be managed by primary care provider.    Anticipate follow up in 05/2025.

## 2024-08-25 ENCOUNTER — Ambulatory Visit: Admitting: Dermatology

## 2024-08-28 ENCOUNTER — Other Ambulatory Visit: Payer: Self-pay | Admitting: Dermatology

## 2024-08-28 DIAGNOSIS — B8801 Infestation by demodex mites: Secondary | ICD-10-CM

## 2024-09-05 ENCOUNTER — Emergency Department: Admission: EM | Admit: 2024-09-05 | Discharge: 2024-09-11 | Disposition: A | Discharge status: Home / Self Care

## 2024-09-05 ENCOUNTER — Emergency Department

## 2024-09-05 ENCOUNTER — Encounter: Payer: Self-pay | Admitting: Emergency Medicine

## 2024-09-05 ENCOUNTER — Other Ambulatory Visit: Payer: Self-pay

## 2024-09-05 DIAGNOSIS — R296 Repeated falls: Secondary | ICD-10-CM | POA: Diagnosis not present

## 2024-09-05 DIAGNOSIS — J449 Chronic obstructive pulmonary disease, unspecified: Secondary | ICD-10-CM | POA: Diagnosis not present

## 2024-09-05 DIAGNOSIS — I129 Hypertensive chronic kidney disease with stage 1 through stage 4 chronic kidney disease, or unspecified chronic kidney disease: Secondary | ICD-10-CM | POA: Diagnosis not present

## 2024-09-05 DIAGNOSIS — N1832 Chronic kidney disease, stage 3b: Secondary | ICD-10-CM | POA: Insufficient documentation

## 2024-09-05 DIAGNOSIS — R42 Dizziness and giddiness: Secondary | ICD-10-CM | POA: Diagnosis present

## 2024-09-05 DIAGNOSIS — E039 Hypothyroidism, unspecified: Secondary | ICD-10-CM | POA: Insufficient documentation

## 2024-09-05 DIAGNOSIS — S0093XA Contusion of unspecified part of head, initial encounter: Secondary | ICD-10-CM | POA: Diagnosis not present

## 2024-09-05 DIAGNOSIS — W1830XA Fall on same level, unspecified, initial encounter: Secondary | ICD-10-CM | POA: Diagnosis not present

## 2024-09-05 LAB — URINALYSIS, ROUTINE W REFLEX MICROSCOPIC
Bilirubin Urine: NEGATIVE
Glucose, UA: NEGATIVE mg/dL
Hgb urine dipstick: NEGATIVE
Ketones, ur: NEGATIVE mg/dL
Leukocytes,Ua: NEGATIVE
Nitrite: NEGATIVE
Protein, ur: NEGATIVE mg/dL
Specific Gravity, Urine: 1.011 (ref 1.005–1.030)
pH: 7 (ref 5.0–8.0)

## 2024-09-05 LAB — CBC
HCT: 41.9 % (ref 39.0–52.0)
Hemoglobin: 13.9 g/dL (ref 13.0–17.0)
MCH: 30.8 pg (ref 26.0–34.0)
MCHC: 33.2 g/dL (ref 30.0–36.0)
MCV: 92.7 fL (ref 80.0–100.0)
Platelets: 227 K/uL (ref 150–400)
RBC: 4.52 MIL/uL (ref 4.22–5.81)
RDW: 14.2 % (ref 11.5–15.5)
WBC: 6.3 K/uL (ref 4.0–10.5)
nRBC: 0 % (ref 0.0–0.2)

## 2024-09-05 LAB — COMPREHENSIVE METABOLIC PANEL WITH GFR
ALT: 13 U/L (ref 0–44)
AST: 19 U/L (ref 15–41)
Albumin: 3.3 g/dL — ABNORMAL LOW (ref 3.5–5.0)
Alkaline Phosphatase: 54 U/L (ref 38–126)
Anion gap: 8 (ref 5–15)
BUN: 26 mg/dL — ABNORMAL HIGH (ref 8–23)
CO2: 28 mmol/L (ref 22–32)
Calcium: 8.9 mg/dL (ref 8.9–10.3)
Chloride: 102 mmol/L (ref 98–111)
Creatinine, Ser: 1.45 mg/dL — ABNORMAL HIGH (ref 0.61–1.24)
GFR, Estimated: 49 mL/min — ABNORMAL LOW (ref >60)
Glucose, Bld: 85 mg/dL (ref 70–99)
Potassium: 4 mmol/L (ref 3.5–5.1)
Sodium: 138 mmol/L (ref 135–145)
Total Bilirubin: 0.7 mg/dL (ref 0.0–1.2)
Total Protein: 7 g/dL (ref 6.5–8.1)

## 2024-09-05 MED ORDER — HYDROCODONE-ACETAMINOPHEN 5-325 MG PO TABS
1.0000 | ORAL_TABLET | Freq: Four times a day (QID) | ORAL | Status: DC | PRN
  Administered 2024-09-07 – 2024-09-09 (×3): 1 via ORAL
  Filled 2024-09-05 (×3): qty 1

## 2024-09-05 MED ORDER — TAMSULOSIN HCL 0.4 MG PO CAPS
0.4000 mg | ORAL_CAPSULE | Freq: Every day | ORAL | Status: DC
  Administered 2024-09-05 – 2024-09-10 (×6): 0.4 mg via ORAL
  Filled 2024-09-05 (×6): qty 1

## 2024-09-05 MED ORDER — MONTELUKAST SODIUM 5 MG PO CHEW
5.0000 mg | CHEWABLE_TABLET | Freq: Every day | ORAL | Status: DC
  Filled 2024-09-05: qty 1

## 2024-09-05 MED ORDER — FLUTICASONE FUROATE-VILANTEROL 200-25 MCG/ACT IN AEPB
1.0000 | INHALATION_SPRAY | Freq: Every day | RESPIRATORY_TRACT | Status: DC
  Administered 2024-09-06 – 2024-09-11 (×6): 1 via RESPIRATORY_TRACT
  Filled 2024-09-05 (×3): qty 28

## 2024-09-05 MED ORDER — LEVOTHYROXINE SODIUM 50 MCG PO TABS
50.0000 ug | ORAL_TABLET | Freq: Every day | ORAL | Status: DC
  Administered 2024-09-06 – 2024-09-11 (×6): 50 ug via ORAL
  Filled 2024-09-05 (×6): qty 1

## 2024-09-05 MED ORDER — ATORVASTATIN CALCIUM 20 MG PO TABS
10.0000 mg | ORAL_TABLET | Freq: Every evening | ORAL | Status: DC
  Administered 2024-09-05 – 2024-09-10 (×6): 10 mg via ORAL
  Filled 2024-09-05 (×6): qty 1

## 2024-09-05 MED ORDER — ACETAMINOPHEN 500 MG PO TABS
500.0000 mg | ORAL_TABLET | ORAL | Status: DC | PRN
  Administered 2024-09-05 – 2024-09-06 (×2): 500 mg via ORAL
  Filled 2024-09-05 (×2): qty 1

## 2024-09-05 MED ORDER — CYCLOSPORINE 0.05 % OP EMUL
1.0000 [drp] | Freq: Two times a day (BID) | OPHTHALMIC | Status: DC | PRN
  Administered 2024-09-07 – 2024-09-11 (×4): 1 [drp] via OPHTHALMIC
  Filled 2024-09-05 (×8): qty 30

## 2024-09-05 MED ORDER — MIRABEGRON ER 25 MG PO TB24
25.0000 mg | ORAL_TABLET | Freq: Every day | ORAL | Status: DC
  Administered 2024-09-05 – 2024-09-11 (×7): 25 mg via ORAL
  Filled 2024-09-05 (×7): qty 1

## 2024-09-05 MED ORDER — LOSARTAN POTASSIUM 50 MG PO TABS
25.0000 mg | ORAL_TABLET | Freq: Every day | ORAL | Status: DC
  Administered 2024-09-05 – 2024-09-11 (×7): 25 mg via ORAL
  Filled 2024-09-05 (×7): qty 1

## 2024-09-05 MED ORDER — TIZANIDINE HCL 2 MG PO TABS
2.0000 mg | ORAL_TABLET | Freq: Every day | ORAL | Status: DC
  Administered 2024-09-05 – 2024-09-10 (×6): 2 mg via ORAL
  Filled 2024-09-05 (×6): qty 1

## 2024-09-05 MED ORDER — MIDODRINE HCL 5 MG PO TABS: 2.5000 mg | ORAL_TABLET | Freq: Two times a day (BID) | ORAL | Status: DC | PRN

## 2024-09-05 MED ORDER — DIVALPROEX SODIUM 250 MG PO DR TAB
250.0000 mg | DELAYED_RELEASE_TABLET | Freq: Two times a day (BID) | ORAL | Status: DC
  Administered 2024-09-05 – 2024-09-11 (×12): 250 mg via ORAL
  Filled 2024-09-05 (×12): qty 1

## 2024-09-05 MED ORDER — FENOFIBRATE 54 MG PO TABS
54.0000 mg | ORAL_TABLET | Freq: Every day | ORAL | Status: DC
  Administered 2024-09-05 – 2024-09-11 (×7): 54 mg via ORAL
  Filled 2024-09-05 (×7): qty 1

## 2024-09-05 MED ORDER — ACETAMINOPHEN 325 MG PO TABS
650.0000 mg | ORAL_TABLET | Freq: Once | ORAL | Status: DC
  Filled 2024-09-05: qty 2

## 2024-09-05 MED ORDER — FINASTERIDE 5 MG PO TABS
5.0000 mg | ORAL_TABLET | Freq: Every day | ORAL | Status: DC
  Administered 2024-09-05 – 2024-09-11 (×7): 5 mg via ORAL
  Filled 2024-09-05 (×7): qty 1

## 2024-09-05 MED ORDER — DICLOFENAC SODIUM 1 % EX GEL: 2.0000 g | CUTANEOUS | Status: DC | PRN

## 2024-09-05 MED ORDER — MONTELUKAST SODIUM 10 MG PO TABS
5.0000 mg | ORAL_TABLET | Freq: Every day | ORAL | Status: DC
  Administered 2024-09-05 – 2024-09-10 (×6): 5 mg via ORAL
  Filled 2024-09-05 (×6): qty 1

## 2024-09-05 MED ORDER — ALBUTEROL SULFATE HFA 108 (90 BASE) MCG/ACT IN AERS: 1.0000 | INHALATION_SPRAY | Freq: Three times a day (TID) | RESPIRATORY_TRACT | Status: DC | PRN

## 2024-09-05 MED ORDER — FLUDROCORTISONE ACETATE 0.1 MG PO TABS
100.0000 ug | ORAL_TABLET | Freq: Every day | ORAL | Status: DC
  Administered 2024-09-05 – 2024-09-11 (×7): 100 ug via ORAL
  Filled 2024-09-05 (×7): qty 1

## 2024-09-05 MED ORDER — ALBUTEROL SULFATE (2.5 MG/3ML) 0.083% IN NEBU: 2.5000 mg | INHALATION_SOLUTION | Freq: Three times a day (TID) | RESPIRATORY_TRACT | Status: DC | PRN

## 2024-09-05 MED ORDER — PANTOPRAZOLE SODIUM 40 MG PO TBEC
40.0000 mg | DELAYED_RELEASE_TABLET | Freq: Every day | ORAL | Status: DC
  Administered 2024-09-05 – 2024-09-11 (×7): 40 mg via ORAL
  Filled 2024-09-05 (×7): qty 1

## 2024-09-05 MED ORDER — NORTRIPTYLINE HCL 25 MG PO CAPS
150.0000 mg | ORAL_CAPSULE | Freq: Every day | ORAL | Status: DC
  Administered 2024-09-05 – 2024-09-11 (×7): 150 mg via ORAL
  Filled 2024-09-05 (×7): qty 6

## 2024-09-05 NOTE — ED Notes (Signed)
 Awake alert oriented assisted to toilet in room. Walks with assistance of cane and 1 person.

## 2024-09-05 NOTE — ED Notes (Signed)
 Pt transported to CT at this time.

## 2024-09-05 NOTE — ED Triage Notes (Signed)
 First Nurse Note: Patient to ED via [facility] from home for a fall. PT reports getting light headed and fell. Denies LOC. 3rd fall in the past 2 weeks. Denies injury. Aox4 Denies blood thinners. VS WNL.

## 2024-09-05 NOTE — ED Provider Notes (Signed)
 [facility] Provider Note    Event Date/Time   First MD Initiated Contact with Patient 09/05/24 1531     (approximate)   History   Fall   HPI  Dakota Harper is a 78 y.o. male with medical history significant of hypertension, hyperlipidemia, hypothyroidism, CKD 3B, anemia, gout, COPD, BPH, pituitary adenoma status post decompression surgery, tremors, depression, obesity, OSA, orthostatic hypotension, chronic pain presenting with lightheadedness presents from EMS after fall.  Patient states that he lives at home alone and has no family in town and his emergency contact is a friend that is hard to get a hold of.  He tells me that he normally ambulates with a walker and a cane.  He called EMS as this is his third fall in 2 weeks.  He states that he became lightheaded and fell but denies any loss of consciousness.  He is not on any blood thinners.  He states that he is compliant with his medications including midodrine  and fludrocortisone  for orthostatic hypotension.  Denies any headache, neck pain, worsening back pain, chest pain abdominal pain or pain in any extremity.  He tells me that he previously resided in a skilled nursing facility but moved into a house a year ago.  He wonders whether this was a bad move.     Physical Exam   Triage Vital Signs: ED Triage Vitals  Encounter Vitals Group     BP 09/05/24 1303 (!) 142/76     Girls Systolic BP Percentile --      Girls Diastolic BP Percentile --      Boys Systolic BP Percentile --      Boys Diastolic BP Percentile --      Pulse Rate 09/05/24 1303 74     Resp 09/05/24 1303 18     Temp 09/05/24 1302 (!) (P) 97.4 F (36.3 C)     Temp Source 09/05/24 1302 (P) Oral     SpO2 09/05/24 1303 99 %     Weight 09/05/24 1303 193 lb (87.5 kg)     Height 09/05/24 1303 5' 8 (1.727 m)     Head Circumference --      Peak Flow --      Pain Score 09/05/24 1303 10     Pain Loc --      Pain Education --      Exclude  from Growth Chart --     Most recent vital signs: Vitals:   09/05/24 1635 09/05/24 1712  BP: (!) 163/93 (!) 177/73  Pulse: 72 73  Resp: 20   Temp: (!) 97.5 F (36.4 C)   SpO2: 100%     Nursing Triage Note reviewed. Vital signs reviewed and patients oxygen saturation is normoxic  General: Patient is well nourished, well developed, awake and alert, resting comfortably in no acute distress Head: Normocephalic and atraumatic Eyes: Normal inspection, extraocular muscles intact, no conjunctival pallor Ear, nose, throat: Normal external exam Neck: Normal range of motion, no C-spine tenderness to palpation Respiratory: Patient is in no respiratory distress, lungs CTAB Cardiovascular: Patient is not tachycardic, RRR without murmur appreciated GI: Abd SNT with no guarding or rebound  Back: Normal inspection of the back with good strength and range of motion throughout all ext No T-spine or L-spine tenderness to palpation Extremities: pulses intact with good cap refills, no LE pitting edema or calf tenderness Able to straight leg lift bilaterally Neuro: The patient is alert and oriented to person, place, and time,  appropriately conversive, with 5/5 bilat UE/LE strength, no gross motor or sensory defects noted. Coordination appears to be adequate. Skin: Warm, dry, and intact Psych: normal mood and affect, no SI or HI  ED Results / Procedures / Treatments   Labs (all labs ordered are listed, but only abnormal results are displayed) Labs Reviewed  COMPREHENSIVE METABOLIC PANEL WITH GFR - Abnormal; Notable for the following components:      Result Value   BUN 26 (*)    Creatinine, Ser 1.45 (*)    Albumin 3.3 (*)    GFR, Estimated 49 (*)    All other components within normal limits  URINALYSIS, ROUTINE W REFLEX MICROSCOPIC - Abnormal; Notable for the following components:   Color, Urine YELLOW (*)    APPearance CLEAR (*)    All other components within normal limits  CBC  CBG  MONITORING, ED     EKG EKG and rhythm strip are interpreted by myself:   EKG: [Normal sinus rhythm] at heart rate of 74, normal QRS duration, QTc 477, nonspecific ST segments and T waves no ectopy EKG not consistent with Acute STEMI Rhythm strip: NSR in lead II   RADIOLOGY CT head: No acute abnormality on my independent review and interpretation radiologist agrees    PROCEDURES:  Critical Care performed: No  Procedures   MEDICATIONS ORDERED IN ED: Medications  acetaminophen  (TYLENOL ) tablet 650 mg (650 mg Oral Patient Refused/Not Given 09/05/24 1602)  acetaminophen  (TYLENOL ) tablet 500 mg (has no administration in time range)  atorvastatin  (LIPITOR) tablet 10 mg (10 mg Oral Given 09/05/24 1712)  cycloSPORINE  (RESTASIS ) 0.05 % ophthalmic emulsion 1 drop (has no administration in time range)  diclofenac  Sodium (VOLTAREN ) 1 % topical gel 2 g (has no administration in time range)  divalproex  (DEPAKOTE ) DR tablet 250 mg (has no administration in time range)  fenofibrate  tablet 54 mg (54 mg Oral Given 09/05/24 1713)  finasteride  (PROSCAR ) tablet 5 mg (5 mg Oral Given 09/05/24 1713)  fludrocortisone  (FLORINEF ) tablet 100 mcg (100 mcg Oral Given 09/05/24 1713)  fluticasone  furoate-vilanterol (BREO ELLIPTA ) 200-25 MCG/ACT 1 puff (has no administration in time range)  HYDROcodone -acetaminophen  (NORCO/VICODIN) 5-325 MG per tablet 1 tablet (has no administration in time range)  levothyroxine  (SYNTHROID ) tablet 50 mcg (has no administration in time range)  losartan  (COZAAR ) tablet 25 mg (25 mg Oral Given 09/05/24 1712)  midodrine  (PROAMATINE ) tablet 2.5 mg (has no administration in time range)  nortriptyline  (PAMELOR ) capsule 150 mg (150 mg Oral Given 09/05/24 1713)  pantoprazole  (PROTONIX ) EC tablet 40 mg (40 mg Oral Given 09/05/24 1712)  tamsulosin  (FLOMAX ) capsule 0.4 mg (has no administration in time range)  tiZANidine  (ZANAFLEX ) tablet 2 mg (has no administration in time range)   mirabegron  ER (MYRBETRIQ ) tablet 25 mg (25 mg Oral Given 09/05/24 1713)  albuterol  (PROVENTIL ) (2.5 MG/3ML) 0.083% nebulizer solution 2.5 mg (has no administration in time range)  montelukast  (SINGULAIR ) tablet 5 mg (has no administration in time range)     IMPRESSION / MDM / ASSESSMENT AND PLAN / ED COURSE                                Differential diagnosis includes, but is not limited to, intracranial hemorrhage, electrolyte derangement, anemia, UTI, worsening orthostatic hypotension, decompensation   ED course: Patient presents without any focal neurological deficits and EKG demonstrates no evidence of acute arrhythmia.  I reviewed his chart including his endocrinology note from  last month and also his discharge summary.  His frequent falls seem to be an ongoing issue.  He was not anemic and had no acute renal insufficiency or profound electrolyte derangements.  CT head was unremarkable.  I did consider possible increasing his midodrine  dose as he is on a very small amount however patient is hypertensive here. We did ambulate the patient around the unit and he was 1-2 person assist even using a cane/walker and per nursing he was very unsteady.  Patient is going to be considered boarder status for evaluation for either home health or long-term care once his urinalysis results.   Clinical Course as of 09/05/24 1858  Fri Sep 05, 2024  1536 BP(!): 142/76 Not low  [HD]  1546 Creatinine(!): 1.45 Creatinine seems to be at baseline with the patient [HD]  1556 Patient does want to stay as a boarder, asked pharmacy tech to med reconcile the patient [HD]  1834 Urinalysis, Routine w reflex microscopic -Urine, Clean Catch(!) Not consistent with UTI.  Will make the patient a bottle [HD]    Clinical Course User Index [HD] Nicholaus Rolland BRAVO, MD   Plan to sign patient out to oncoming physician at 11 PM  -- Risk: 5 This patient has a high risk of morbidity due to further diagnostic testing or  treatment. Rationale: This patient's evaluation and management involve a high risk of morbidity due to the potential severity of presenting symptoms, need for diagnostic testing, and/or initiation of treatment that may require close monitoring. The differential includes conditions with potential for significant deterioration or requiring escalation of care. Treatment decisions in the ED, including medication administration, procedural interventions, or disposition planning, reflect this level of risk. COPA: 5 The patient has the following acute or chronic illness/injury that poses a possible threat to life or bodily function: [X] : The patient has a potentially serious acute condition or an acute exacerbation of a chronic illness requiring urgent evaluation and management in the Emergency Department. The clinical presentation necessitates immediate consideration of life-threatening or function-threatening diagnoses, even if they are ultimately ruled out.   FINAL CLINICAL IMPRESSION(S) / ED DIAGNOSES   Final diagnoses:  Frequent falls  Contusion of head, unspecified part of head, initial encounter     Rx / DC Orders   ED Discharge Orders     None        Note:  This document was prepared using Dragon voice recognition software and may include unintentional dictation errors.   Nicholaus Rolland BRAVO, MD 09/05/24 [phone]

## 2024-09-05 NOTE — ED Notes (Signed)
 Patient ambulated with this RN using cane. Patient had an unsteady gait and required assistance from this RN for stabilization. Patient returned to bed without complications. MD Nicholaus made aware.

## 2024-09-06 DIAGNOSIS — S0093XA Contusion of unspecified part of head, initial encounter: Secondary | ICD-10-CM | POA: Diagnosis not present

## 2024-09-06 MED ORDER — POLYETHYLENE GLYCOL 3350 17 G PO PACK
17.0000 g | PACK | Freq: Every day | ORAL | Status: DC | PRN
  Administered 2024-09-06 – 2024-09-10 (×5): 17 g via ORAL
  Filled 2024-09-06 (×5): qty 1

## 2024-09-06 NOTE — ED Notes (Signed)
 Nursing called tomorrow's breakfast order to dietary per patient request

## 2024-09-06 NOTE — ED Notes (Signed)
 Dinner order placed

## 2024-09-06 NOTE — ED Notes (Signed)
 Meal order called to dietary

## 2024-09-06 NOTE — ED Notes (Signed)
 Dinner tray given

## 2024-09-06 NOTE — ED Notes (Signed)
 Patient assisted to toilet, 1 person stand by assist

## 2024-09-06 NOTE — ED Notes (Signed)
 Sent message to pharm requesting breo ellipta 

## 2024-09-06 NOTE — NC FL2 (Addendum)
 [place]  MEDICAID FL2 LEVEL OF CARE FORM     IDENTIFICATION  Patient Name: Dakota Harper Birthdate: Nov 28, 1945 Sex: male Admission Date (Current Location): 09/05/2024  [facility] and [place] Number:  Chiropodist and Address:  [facility], [address], [place], [place] [address]      Provider Number: [id number]  Attending Physician Name and Address:  Ernest Ronal BRAVO, MD  Relative Name and Phone Number:  RUMALDA VERNELL Grad)  [phone]    Current Level of Care: Hospital Recommended Level of Care: Skilled Nursing Facility Prior Approval Number:    Date Approved/Denied:   PASRR Number: [id number]  Discharge Plan: SNF    Current Diagnoses: Patient Active Problem List   Diagnosis Date Noted   Near syncope 07/31/2024   Pain due to onychomycosis of toenails of both feet 07/17/2022   Hyponatremia 07/10/2022   Mild protein malnutrition 07/10/2022   Thrombocytopenia 07/10/2022   Normocytic anemia 07/10/2022   Orthostatic hypotension 06/08/2022   Stage 3b chronic kidney disease (CKD) (HCC) 06/08/2022   Dehydration    Callus of foot 06/09/2021   COPD with chronic bronchitis (HCC) 10/27/2020   Ingrown left big toenail 10/25/2020   Ingrown nail of great toe of right foot 10/25/2020   Hypothyroidism 11/25/2019   BPH (benign prostatic hyperplasia) 11/25/2019   Headache 11/25/2019   Tremors of nervous system 11/25/2019   Thrombocytosis 11/25/2019   Elevated bilirubin 11/25/2019   OSA (obstructive sleep apnea) 11/14/2017   Chronic pain syndrome 10/18/2015   Self-injurious behavior 10/18/2015   Suicidal ideation 10/18/2015   History of substance use 10/17/2015   Generalized abdominal pain 10/07/2015   Chronic idiopathic constipation 09/24/2015   Eczema 06/24/2015   Plantar fasciitis, left 11/19/2014   Chest pain- musculoskeletal 07/03/2014   Leg pain, bilateral 07/03/2014   Varicose veins 07/03/2014   Shoulder bursitis 07/02/2014    Cholelithiasis 01/14/2014   Cataract cortical, senile 01/09/2014   Meibomian gland dysfunction (MGD), bilateral, both upper and lower lids 01/09/2014   Pinguecula of both eyes 01/09/2014   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   Obesity (BMI 35.0-39.9 without comorbidity) 09/26/2013   Pituitary adenoma (HCC) 09/22/2013   Gout 09/18/2013   Allergic bronchitis 08/30/2013   Chronic cough 07/18/2013   Pruritic disorder 06/14/2012   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 Yes; Fluid consistency: Thin: General starting at 11/01 9260  Fall precautions starting at 10/31 1835)  AMBULATORY STATUS COMMUNICATION OF NEEDS Skin   Extensive Assist Verbally Normal                       Personal Care Assistance Level of Assistance  Bathing, Feeding, Dressing Bathing Assistance: Limited assistance Feeding assistance: Independent Dressing Assistance: Limited assistance     Functional Limitations Info  Sight, Hearing, Speech          SPECIAL CARE FACTORS FREQUENCY  PT (By licensed PT), OT (By licensed OT)     PT Frequency: 2x OT Frequency: 2x            Contractures Contractures Info:  Present    Additional Factors Info  Allergies, Code Status Code Status Info: Prior Allergies Info: Saw palmetto; Sulfacetimide Sodium; Sulfa Antibiotics;           Current Medications (09/06/2024):  This is the current hospital active medication list Current Facility-Administered Medications  Medication Dose Route Frequency Provider Last Rate Last  Admin   acetaminophen  (TYLENOL ) tablet 500 mg  500 mg Oral Q4H PRN Davis, Hillary E, MD   500 mg at 09/06/24 1603   acetaminophen  (TYLENOL ) tablet 650 mg  650 mg Oral Once Davis, Hillary E, MD       albuterol  (PROVENTIL ) (2.5 MG/3ML) 0.083% nebulizer solution 2.5 mg  2.5 mg Nebulization TID PRN Davis, Hillary E, MD       atorvastatin  (LIPITOR) tablet 10 mg  10 mg Oral QPM Nicholaus Rolland BRAVO, MD   10 mg at 09/05/24 1712   cycloSPORINE  (RESTASIS ) 0.05 % ophthalmic emulsion 1 drop  1 drop Both Eyes BID PRN Nicholaus Rolland BRAVO, MD       diclofenac  Sodium (VOLTAREN ) 1 % topical gel 2 g  2 g Topical PRN Nicholaus Rolland BRAVO, MD       divalproex  (DEPAKOTE ) DR tablet 250 mg  250 mg Oral BID Nicholaus Rolland E, MD   250 mg at 09/06/24 9068   fenofibrate  tablet 54 mg  54 mg Oral Daily Nicholaus Rolland E, MD   54 mg at 09/06/24 0932   finasteride  (PROSCAR ) tablet 5 mg  5 mg Oral Daily Nicholaus Rolland E, MD   5 mg at 09/06/24 9066   fludrocortisone  (FLORINEF ) tablet 100 mcg  100 mcg Oral Daily Davis, Hillary E, MD   100 mcg at 09/06/24 0933   fluticasone  furoate-vilanterol (BREO ELLIPTA ) 200-25 MCG/ACT 1 puff  1 puff Inhalation Daily Nicholaus Rolland BRAVO, MD   1 puff at 09/06/24 1244   HYDROcodone -acetaminophen  (NORCO/VICODIN) 5-325 MG per tablet 1 tablet  1 tablet Oral Q6H PRN Nicholaus Rolland BRAVO, MD       levothyroxine  (SYNTHROID ) tablet 50 mcg  50 mcg Oral QAC breakfast Nicholaus Rolland BRAVO, MD   50 mcg at 09/06/24 9278   losartan  (COZAAR ) tablet 25 mg  25 mg Oral Daily Nicholaus Rolland E, MD   25 mg at 09/06/24 0931   midodrine  (PROAMATINE ) tablet 2.5 mg  2.5 mg Oral BID PRN Nicholaus Rolland BRAVO, MD       mirabegron  ER (MYRBETRIQ ) tablet 25 mg  25 mg Oral Daily Nicholaus Rolland E, MD   25 mg at 09/06/24 0932   montelukast  (SINGULAIR ) tablet 5 mg  5 mg Oral QHS Hunt, Madison H, RPH   5 mg at 09/05/24 2133   nortriptyline  (PAMELOR ) capsule 150 mg  150 mg Oral Daily Nicholaus Rolland E, MD   150 mg at 09/06/24 0932   pantoprazole  (PROTONIX ) EC  tablet 40 mg  40 mg Oral Daily Nicholaus Rolland E, MD   40 mg at 09/06/24 0931   polyethylene glycol (MIRALAX  / GLYCOLAX ) packet 17 g  17 g Oral Daily PRN Levander Slate, MD   17 g at 09/06/24 1556   tamsulosin  (FLOMAX ) capsule 0.4 mg  0.4 mg Oral QHS Nicholaus Rolland E, MD   0.4 mg at 09/05/24 2134   tiZANidine  (ZANAFLEX ) tablet 2 mg  2 mg Oral QHS Nicholaus Rolland BRAVO, MD   2 mg at 09/05/24 2133   Current Outpatient Medications  Medication Sig Dispense Refill   acetaminophen  (TYLENOL ) 500 MG tablet Take 500 mg by mouth every  4 (four) hours as needed for moderate pain or mild pain.     albuterol  (PROVENTIL  HFA;VENTOLIN  HFA) 108 (90 BASE) MCG/ACT inhaler Inhale 1 puff into the lungs 3 (three) times daily as needed for wheezing or shortness of breath.     aspirin  EC 81 MG tablet Take 81 mg by mouth at bedtime. (2100)     atorvastatin  (LIPITOR) 10 MG tablet Take 10 mg by mouth every evening. (2000)     carbamide peroxide (EAR WAX REMOVAL DROPS) 6.5 % OTIC solution Place 5 drops into both ears every Sunday.     cycloSPORINE  (RESTASIS ) 0.05 % ophthalmic emulsion Place 1 drop into both eyes 2 (two) times daily as needed (dry eyes).     diclofenac  Sodium (VOLTAREN ) 1 % GEL Apply 2 g topically as needed (lower left leg pain.).     divalproex  (DEPAKOTE ) 250 MG DR tablet Take 250 mg by mouth 2 (two) times daily.     Dupilumab  (DUPIXENT ) 300 MG/2ML SOAJ Inject 300 mg into the skin every 14 (fourteen) days. Starting at day 15 for maintenance. 4 mL 5   fenofibrate  54 MG tablet Take 1 tablet (54 mg total) by mouth daily.     finasteride  (PROSCAR ) 5 MG tablet Take 1 tablet (5 mg total) by mouth daily. 90 tablet 3   fludrocortisone  (FLORINEF ) 0.1 MG tablet Take 100 mcg by mouth daily.     Fluocinolone  Acetonide Body 0.01 % OIL Apply to scalp 1-2 times a day and leave on. 118.28 mL 6   fluocinonide  ointment (LIDEX ) 0.05 % Apply 1 Application topically 2 (two) times daily as needed (pruritus). (apply to legs) 30 g 5    fluticasone  furoate-vilanterol (BREO ELLIPTA ) 200-25 MCG/INH AEPB Inhale 1 puff into the lungs daily. (0800)     HYDROcodone -acetaminophen  (NORCO/VICODIN) 5-325 MG tablet Take 1 tablet by mouth every 6 (six) hours as needed.     levothyroxine  (SYNTHROID , LEVOTHROID) 50 MCG tablet Take 50 mcg by mouth daily before breakfast. (0630)     losartan  (COZAAR ) 25 MG tablet Take 25 mg by mouth daily.     midodrine  (PROAMATINE ) 2.5 MG tablet Take 1 tablet (2.5 mg total) by mouth 2 (two) times daily as needed (for SBP <120). 30 tablet 0   montelukast  (SINGULAIR ) 5 MG chewable tablet Chew 5 mg by mouth at bedtime. (2100)     nortriptyline  (PAMELOR ) 75 MG capsule Take 150 mg by mouth daily. (0900)     omeprazole  (PRILOSEC) 40 MG capsule Take 1 capsule (40 mg total) by mouth daily. 90 capsule 0   permethrin  (ELIMITE ) 5 % cream APPLY EVERY MORNING AND AT BEDTIME TO FACE AND SCALP 60 g 1   tamsulosin  (FLOMAX ) 0.4 MG CAPS capsule Take 0.4 mg by mouth at bedtime. (2100)     tiZANidine  (ZANAFLEX ) 2 MG tablet Take 2 mg by mouth at bedtime. (2000)     Vibegron  (GEMTESA ) 75 MG TABS Take 1 tablet (75 mg total) by mouth daily. 90 tablet 3   tacrolimus  (PROTOPIC ) 0.1 % ointment Apply topically 2 (two) times daily. (Patient not taking: Reported on 09/05/2024) 30 g 0     Discharge Medications: Please see discharge summary for a list of discharge medications.  Relevant Imaging Results:  Relevant Lab Results:   Additional Information [id number]  Hazeline Charnley L Sherae Santino, LCSW

## 2024-09-06 NOTE — TOC Initial Note (Signed)
 Transition of Care [facility]) - Initial/Assessment Note    Patient Details  Name: Dakota Harper MRN: [id number] Date of Birth: June 14, 1946  [facility] CM/SW Contact:    Shalondra Wunschel L Harmoney Sienkiewicz, LCSW Phone Number: 09/06/2024, 4:27 PM  Clinical Narrative:                    [facility] consult received for SNF Placement. CSW met with patient. No family at bedside. CSW and patient discussed recommendations for SNF. Patient is agreeable to going.   Patient advised that he was living at [facility] but discharged himself because it was too expensive. Patient lives alone. He reports have a RW with a seat and a cane that he uses at home. PCP is at [facility].    Patient advised that his last PCP appointment was three months ago. Patient is A&O.   Patient advised of no preferences but stated that he wanted to remain in [facility] for SNF placement.   Patient expressed concerns about not being able to shave and his personal belonging not being at the facility. CSW encouraged patient to call his support system to see if they can assist.   FL2 will be completed as well as bed search. Shara will be started.      Patient Goals and CMS Choice            Expected Discharge Plan and Services                                              Prior Living Arrangements/Services                       Activities of Daily Living      Permission Sought/Granted                  Emotional Assessment              Admission diagnosis:  Fall Patient Active Problem List   Diagnosis Date Noted   Near syncope 07/31/2024   Pain due to onychomycosis of toenails of both feet 07/17/2022   Hyponatremia 07/10/2022   Mild protein malnutrition 07/10/2022   Thrombocytopenia 07/10/2022   Normocytic anemia 07/10/2022   Orthostatic hypotension 06/08/2022   Stage 3b chronic kidney disease (CKD) (HCC) 06/08/2022   Dehydration    Callus of foot  06/09/2021   COPD with chronic bronchitis (HCC) 10/27/2020   Ingrown left big toenail 10/25/2020   Ingrown nail of great toe of right foot 10/25/2020   Hypothyroidism 11/25/2019   BPH (benign prostatic hyperplasia) 11/25/2019   Headache 11/25/2019   Tremors of nervous system 11/25/2019   Thrombocytosis 11/25/2019   Elevated bilirubin 11/25/2019   OSA (obstructive sleep apnea) 11/14/2017   Chronic pain syndrome 10/18/2015   Self-injurious behavior 10/18/2015   Suicidal ideation 10/18/2015   History of substance use 10/17/2015   Generalized abdominal pain 10/07/2015   Chronic idiopathic constipation 09/24/2015   Eczema 06/24/2015   Plantar fasciitis, left 11/19/2014   Chest pain- musculoskeletal 07/03/2014   Leg pain, bilateral 07/03/2014   Varicose veins 07/03/2014   Shoulder bursitis 07/02/2014   Cholelithiasis 01/14/2014   Cataract cortical, senile 01/09/2014   Meibomian gland dysfunction (MGD), bilateral, both upper and lower lids 01/09/2014   Pinguecula of both eyes 01/09/2014   Hypogonadism male  10/20/2013   Obesity (BMI 35.0-39.9 without comorbidity) 09/26/2013   Pituitary adenoma (HCC) 09/22/2013   Gout 09/18/2013   Allergic bronchitis 08/30/2013   Chronic cough 07/18/2013   Pruritic disorder 06/14/2012   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 [place] [address] Phone: [phone] Fax: [phone]  [facility] - [place], [place] - [address] [place] [address] Phone: [phone] Fax: [phone]  [id number] [place], [place] - [address] AT [facility] OF [address] [place] [address] Phone: [phone] Fax: [phone]     Social Drivers of Health (SDOH) Social History: SDOH Screenings   Food Insecurity: No Food Insecurity (07/31/2024)  Housing: Unknown (07/31/2024)  Transportation Needs: No Transportation Needs (07/31/2024)  Utilities: Not At Risk (07/31/2024)  Financial Resource Strain: Low Risk  (04/15/2024)   Received from [facility]  Social Connections: Unknown (07/31/2024)  Tobacco Use: Medium Risk (09/05/2024)   SDOH Interventions:     Readmission Risk Interventions     No data to display

## 2024-09-06 NOTE — Evaluation (Signed)
 Occupational Therapy Evaluation Patient Details Name: Dakota Harper MRN: [id number] DOB: 01/14/1946 Today's Date: 09/06/2024   History of Present Illness   Dakota Harper is a 78 y.o. male with medical history significant of hypertension, hyperlipidemia, hypothyroidism, CKD 3B, anemia, gout, COPD, BPH, pituitary adenoma status post decompression surgery, tremors, depression, obesity, OSA, orthostatic hypotension, chronic pain presenting with lightheadedness presents from EMS after fall.     Clinical Impressions Dakota Harper presents with generalized weakness, limited endurance, tremor, and repeated falls. As of this year, he has been living alone in a house. Prior to that, he spent a year at [facility] assisted living facility. He has few supports: he receives assistance for community outings/shopping from a social service agency and states that he has 1 friend who will take him to the grocery store as needed. Other than this, he appears to be on his own. He has had numerous falls (~ 3 in past few weeks; multiple falls during the summer). He reports showering every 3 days, having TV dinners or cereal for his meals, states he is able to dress and toilet INDly. During today's evaluation, pt is A&Ox3 but demonstrates limited safety awareness and appears to have some cognitive impairment (e.g., he reports that he had his phone with him when he fell recently but was unable to figure out how to turn it on; he is unable to provide many details about his home setup or PLOF). Pt struggles to perform bed mobility and transfers; he has constant tremor in bilateral UE and displays poor standing balance with RW. Pt has significant drop in BP with positional changes:  BP in supine 158/67; BP in sitting 122/67; BP in standing 102/58; BP after standing 3 minutes 85/52, at which point pt endorses dizziness, demonstrates reduced responsiveness, and requires Max A+2 to return to supine. Pt is a high falls risk 2/2  orthostatic hypotension, and he is generally unable to detect changes in his BP. Recommend ongoing OT while pt is hospitalized. Anticipate that pt would be unsafe to be home alone until his BP can remain in a stable range. Recommend ongoing rehab 5 days/wk, < 3 hrs/day.   If plan is discharge home, recommend the following:   A lot of help with walking and/or transfers;A lot of help with bathing/dressing/bathroom;Assistance with cooking/housework;Assist for transportation     Functional Status Assessment   Patient has had a recent decline in their functional status and demonstrates the ability to make significant improvements in function in a reasonable and predictable amount of time.     Equipment Recommendations   None recommended by OT     Recommendations for Other Services         Precautions/Restrictions   Precautions Precautions: Fall;Other (comment) Recall of Precautions/Restrictions: Impaired Precaution/Restrictions Comments: orthostatic precautions Restrictions Weight Bearing Restrictions Per Provider Order: No     Mobility Bed Mobility Overal bed mobility: Needs Assistance Bed Mobility: Sit to Supine       Sit to supine: +2 for physical assistance, Max assist   General bed mobility comments: Required Max A +2 to return to bed after experiencing presyncope; Mod A+2 to boost to [facility] in supine    Transfers Overall transfer level: Needs assistance Equipment used: Rolling walker (2 wheels) Transfers: Sit to/from Stand Sit to Stand: From elevated surface, Min assist           General transfer comment: sit <> stand at ED stretcher (elevated surface) to RW but continued to lean back  against bed.      Balance Overall balance assessment: Needs assistance Sitting-balance support: Bilateral upper extremity supported, Feet unsupported Sitting balance-Leahy Scale: Fair   Postural control: Posterior lean Standing balance support: Reliant on assistive  device for balance, Bilateral upper extremity supported, During functional activity Standing balance-Leahy Scale: Poor Standing balance comment: Patient stood at edge of bed with RW for 3 min while leaning backwards on bed during orthostatic vitals. He appeared to be dependent on support from RW and bed to remain balanced in standing.                           ADL either performed or assessed with clinical judgement   ADL Overall ADL's : Needs assistance/impaired Eating/Feeding: Supervision/ safety               Upper Body Dressing : Minimal assistance       Toilet Transfer: Moderate assistance   Toileting- Clothing Manipulation and Hygiene: Moderate assistance               Vision         Perception         Praxis         Pertinent Vitals/Pain Pain Assessment Pain Assessment: No/denies pain Pain Score: 0-No pain     Extremity/Trunk Assessment Upper Extremity Assessment Upper Extremity Assessment: Generalized weakness   Lower Extremity Assessment Lower Extremity Assessment: Generalized weakness   Cervical / Trunk Assessment Cervical / Trunk Assessment: Kyphotic   Communication Communication Communication: No apparent difficulties Factors Affecting Communication: Reduced clarity of speech   Cognition Arousal: Alert Behavior During Therapy: WFL for tasks assessed/performed               OT - Cognition Comments: A&Ox 3; however, has difficulty describing his PLOF or home setup; provides rambling, vague responses to questions                 Following commands: Intact       Cueing  General Comments   Cueing Techniques: Verbal cues;Tactile cues  Pt with constant tremor, worsened when he became dizzy after standing 3 min, w/ BP dropping to 85/52   Exercises Other Exercises Other Exercises: Educ re: falls prevention, home safety, DC recs   Shoulder Instructions      Home Living Family/patient expects to be discharged  to:: Private residence Living Arrangements: Alone Available Help at Discharge: Other (Comment);Available PRN/intermittently (someone from a social service agency helps him ~ once per week for community-based activities/shopping) Type of Home: House Home Access: Stairs to enter [employer] of Steps: 2 steps on the front but one is really tall (this is the easiest to access entrance); 2 steps on side but one tall and it is a long uneven walk to reach them. Entrance Stairs-Rails:  (unsure) Home Layout: One level     Bathroom Shower/Tub: Chief Strategy Officer: Standard     Home Equipment: Rollator (4 wheels);Cane - single point   Additional Comments: life alert (he forgot to wear it yesterday when he fell)      Prior Functioning/Environment Prior Level of Function : Needs assist;History of Falls (last six months);Patient poor historian/Family not available             Mobility Comments: Pt reports using SPC or rollator in the home and [facility] out of the house. He recalls 3 falls in the last month and states this is how many  falls he had in the last 6 months (however it is documented 3 falls in the last 2 weeks and he was here for falls a month ago). He states he collapses while turning but denies dizziness or syncope. ADLs Comments: He states he is I with ADLs but is terrified of the shower (which he uses every 3 days). He states he makes TV dinners. His friend drives him to the grocery store and someone from the agency that helped placed him will come and help with housework whenever he calls.    OT Problem List: Decreased strength;Decreased activity tolerance;Impaired balance (sitting and/or standing);Decreased safety awareness;Impaired UE functional use   OT Treatment/Interventions: Self-care/ADL training;DME and/or AE instruction;Therapeutic activities;Balance training;Therapeutic exercise;Cognitive remediation/compensation;Energy conservation;Patient/family  education;Neuromuscular education      OT Goals(Current goals can be found in the care plan section)   Acute Rehab OT Goals Patient Stated Goal: to not fall OT Goal Formulation: With patient Time For Goal Achievement: 09/20/24 Potential to Achieve Goals: Fair   OT Frequency:  Min 2X/week    Co-evaluation PT/OT/SLP Co-Evaluation/Treatment: Yes Reason for Co-Treatment: Complexity of the patient's impairments (multi-system involvement);For patient/therapist safety PT goals addressed during session: Mobility/safety with mobility;Balance;Proper use of DME;Strengthening/ROM OT goals addressed during session: ADL's and self-care;Strengthening/ROM;Proper use of Adaptive equipment and DME      AM-PAC OT 6 Clicks Daily Activity     Outcome Measure Help from another person eating meals?: A Little Help from another person taking care of personal grooming?: A Little Help from another person toileting, which includes using toliet, bedpan, or urinal?: A Lot Help from another person bathing (including washing, rinsing, drying)?: A Lot Help from another person to put on and taking off regular upper body clothing?: A Little Help from another person to put on and taking off regular lower body clothing?: A Lot 6 Click Score: 15   End of Session Equipment Utilized During Treatment: Rolling walker (2 wheels);Gait belt  Activity Tolerance: Treatment limited secondary to medical complications (Comment) (orthostatic hypotension) Patient left: in bed;with call bell/phone within reach  OT Visit Diagnosis: Unsteadiness on feet (R26.81);Other abnormalities of gait and mobility (R26.89);Repeated falls (R29.6);History of falling (Z91.81);Muscle weakness (generalized) (M62.81);Dizziness and giddiness (R42)                Time: 8684-8651 OT Time Calculation (min): 33 min Charges:  OT General Charges $OT Visit: 1 Visit OT Evaluation $OT Eval Moderate Complexity: 1 Mod OT Treatments $Self Care/Home  Management : 8-22 mins Suzen Hock, PhD, MS, OTR/L 09/06/24, 3:17 PM

## 2024-09-06 NOTE — Evaluation (Signed)
 Physical Therapy Evaluation Patient Details Name: Dakota Harper MRN: [id number] DOB: Apr 11, 1946 Today's Date: 09/06/2024  History of Present Illness  Dakota Harper is a 78 y.o. male with medical history significant of hypertension, hyperlipidemia, hypothyroidism, CKD 3B, anemia, gout, COPD, BPH, pituitary adenoma status post decompression surgery, tremors, depression, obesity, OSA, orthostatic hypotension, chronic pain presenting with lightheadedness presents from EMS after fall.   Clinical Impression  Patient received reclining in bed and agreeable to PT evaluation. He was alert and oriented to self, location, and date but appeared to lack safety awareness and problem solving ability and had differences in his history (especially about falls and assistance at home) compared to documented history or report to various healthcare providers. He states he lives alone in a single story home with the easiest entrance on the side having 2 steps (one very tall). He is unsure if there is a handrail there. He states he has fallen 3 times in the last month and over the last 6 months, but he was here last month for similar falls. He states he used to live in assisted living at the [facility] but moved into the home where is now after working with a placement agency that also sends someone to help with housework when he calls. He reports ambulating with [facility] or rollator in the home and [facility] outside the home. He takes ride-share services for transportation or his friend takes him to get groceries. He reports being I with ADLs but is terrified of showering (which he does every 3 days). He states he collapses when he falls, often when turning, and does not experience dizziness. Upon PT evaluation, patient was able to move supine to sit with a lot of struggle and attempts and did not attempt to move the blanket off of his body before moving (ended up seated with blanket still on him). He had some slight  backwards lean and had difficulty sitting up without support while orthostatic vitals obtained. He transferred sit to stand to RW from elevated stretcher with min A and remained leaning on the bed while standing at RW while standing and 3 min standing orthostatic vitals were obtained. Patient then reported feeling increased tremor and lightheadedness and requested to sit down. He went stand to sit with supervision but then transferred sit to supine Total A +2 due to tremor worsening and becoming less responsive. He recovered quickly in supine where his BP returned to 122/115mmHg and HR to 91 bpm. He was able to assist max A + boost up bed. Patient's orthostatic vitals were obtained and he had a severe drop in systolic BP that worsened with continued standing and resulted in pt becoming symptomatic. It was not safe for him to attempt ambulation at that point. Patient appears unable to feel the worsening in his hemodynamics until it is too late to correct before falling. He appears to have experienced a significant decline in functional activity tolerance does not appear safe to attempt upright mobility.  Patient would benefit from skilled physical therapy to address impairments and functional limitations (see PT Problem List below) to work towards stated goals and return to PLOF or maximal functional independence.    Orthostatic VS for the past 24 hrs (Last 3 readings):  BP- Lying Pulse- Lying BP- Sitting Pulse- Sitting BP- Standing at 0 minutes Pulse- Standing at 0 minutes BP- Standing at 3 minutes Pulse- Standing at 3 minutes  09/06/24 1346 158/67 87 122/67 88 102/58 66 (!) 85/52 55  If plan is discharge home, recommend the following: Two people to help with walking and/or transfers;Two people to help with bathing/dressing/bathroom;Assist for transportation;Supervision due to cognitive status;Assistance with cooking/housework;Direct supervision/assist for medications management   Can travel by  private vehicle   No    Equipment Recommendations Rolling walker (2 wheels);BSC/3in1  Recommendations for Other Services       Functional Status Assessment Patient has had a recent decline in their functional status and demonstrates the ability to make significant improvements in function in a reasonable and predictable amount of time.     Precautions / Restrictions Precautions Precautions: Fall;Other (comment) (orthostatic precautions) Recall of Precautions/Restrictions: Impaired (no awareness of how OH is affecting balance) Restrictions Weight Bearing Restrictions Per Provider Order: No      Mobility  Bed Mobility Overal bed mobility: Needs Assistance Bed Mobility: Supine to Sit, Sit to Supine     Supine to sit: Max assist Sit to supine: Modified independent (Device/Increase time)   General bed mobility comments: supine <> sit from flat bed with great effort and difficulty controlling trunk and did not attempt to remove blanket before moving (ended up seated with blanket still on him). Required total A +2 to return to bed after experiencing presyncope. mod A+2 boost in bed    Transfers Overall transfer level: Needs assistance Equipment used: Rolling walker (2 wheels) Transfers: Sit to/from Stand Sit to Stand: Min assist, From elevated surface           General transfer comment: sit <> stand at ED stretcher (elevated surface) to RW but continued to lean back against bed.    Ambulation/Gait               General Gait Details: did not attempt due to pt becoming symptomatic with severe drop in blood pressure after standing at edge of bed for 3 min to check orthostatic hypotension.  Stairs            Wheelchair Mobility     Tilt Bed    Modified Rankin (Stroke Patients Only)       Balance Overall balance assessment: Needs assistance Sitting-balance support: Bilateral upper extremity supported, Feet unsupported Sitting balance-Leahy Scale:  Fair Sitting balance - Comments: effort to maintain sitting without back support Postural control: Posterior lean Standing balance support: Reliant on assistive device for balance, Bilateral upper extremity supported, During functional activity Standing balance-Leahy Scale: Poor Standing balance comment: Patient stood at edge of bed with RW for 3 min while leaning backwards on bed during orthostatic vitals. He appeared to be dependent on support from RW and bed to remain balanced in standing.                             Pertinent Vitals/Pain Pain Assessment Pain Assessment: 0-10 (reports pain in the base of his spine yesterday, but none now) Pain Score: 0-No pain    Home Living Family/patient expects to be discharged to:: Private residence Living Arrangements: Alone Available Help at Discharge: Other (Comment) (reports no one can stay with him, states someone from the agency that helped place him in the home checks in once a week and he has a friend who takes him to the grocery store.) Type of Home: House Home Access: Stairs to enter Entrance Stairs-Rails:  (unsure) Entrance Stairs-Number of Steps: 2 steps on the front but one is really tall (this is the easiest to access entrance); 2 steps on side but one tall  and it is a long uneven walk to reach them.   Home Layout: One level Home Equipment: Rollator (4 wheels);Cane - single point;Other (comment) Additional Comments: life alert (he forgot to wear it yesterday when he fell)    Prior Function Prior Level of Function : Needs assist;History of Falls (last six months);Patient poor historian/Family not available             Mobility Comments: Pt reports using SPC or rollator in the home and [facility] out of the house. He recalls 3 falls in the last month and states this is how many falls he had in the last 6 months (however it is documented 3 falls in the last 2 weeks and he was here for falls a month ago). He states he collapses  while turning but denies dizziness or syncope. ADLs Comments: He states he is I with ADLs but is terrified of the shower (which he uses every 3 days). He states he makes TV dinners. His freind drives him to the grocery store and someone from the agency that helped placed him will come and help with housework whenever he calls.     Extremity/Trunk Assessment   Upper Extremity Assessment Upper Extremity Assessment: Generalized weakness    Lower Extremity Assessment Lower Extremity Assessment: Generalized weakness    Cervical / Trunk Assessment Cervical / Trunk Assessment: Kyphotic (mild)  Communication   Communication Communication: Impaired Factors Affecting Communication: Reduced clarity of speech    Cognition Arousal: Alert Behavior During Therapy: WFL for tasks assessed/performed   PT - Cognitive impairments: No family/caregiver present to determine baseline, Safety/Judgement, Problem solving, Sequencing, Awareness                       PT - Cognition Comments: Pt is oriented to self, location, and date but gives answers about his history of falls and history that conflict with prior documentation. He seems unaware of the reason for his falls and safety awareness is low. He failed to remove the blanket from his body when attempting to go supien to sit. Following commands: Intact       Cueing Cueing Techniques: Verbal cues     General Comments General comments (skin integrity, edema, etc.): Pt with constant tremor that worsened when he became lightheaded after standing 3 min and orthostatic vitals dropped    Exercises Other Exercises Other Exercises: pt educated on role of PT in acute care setting, safe mobiltiy techniques, need to hold ambulation trial due to orthostatic hypotension, safety awareness.   Assessment/Plan    PT Assessment Patient needs continued PT services  PT Problem List Decreased strength;Decreased balance;Decreased cognition;Decreased  knowledge of precautions;Decreased mobility;Decreased knowledge of use of DME;Cardiopulmonary status limiting activity;Decreased activity tolerance;Decreased safety awareness;Decreased coordination       PT Treatment Interventions DME instruction;Therapeutic activities;Cognitive remediation;Gait training;Therapeutic exercise;Patient/family education;Stair training;Balance training;Functional mobility training;Neuromuscular re-education    PT Goals (Current goals can be found in the Care Plan section)  Acute Rehab PT Goals Patient Stated Goal: to stop falling and go home PT Goal Formulation: With patient Time For Goal Achievement: 09/20/24 Potential to Achieve Goals: Fair    Frequency Min 2X/week     Co-evaluation PT/OT/SLP Co-Evaluation/Treatment: Yes Reason for Co-Treatment: Complexity of the patient's impairments (multi-system involvement);For patient/therapist safety PT goals addressed during session: Mobility/safety with mobility;Balance;Proper use of DME;Strengthening/ROM         AM-PAC PT 6 Clicks Mobility  Outcome Measure Help needed turning from your back to your side  while in a flat bed without using bedrails?: None Help needed moving from lying on your back to sitting on the side of a flat bed without using bedrails?: None Help needed moving to and from a bed to a chair (including a wheelchair)?: A Little Help needed standing up from a chair using your arms (e.g., wheelchair or bedside chair)?: A Little Help needed to walk in hospital room?: Total Help needed climbing 3-5 steps with a railing? : Total 6 Click Score: 16    End of Session Equipment Utilized During Treatment: Gait belt Activity Tolerance: Treatment limited secondary to medical complications (Comment) (orthostatic hypotension) Patient left: in bed;with call bell/phone within reach;Other (comment) (with OT in room) Nurse Communication: Mobility status PT Visit Diagnosis: Other abnormalities of gait and  mobility (R26.89);Repeated falls (R29.6);History of falling (Z91.81);Other symptoms and signs involving the nervous system (R29.898)    Time: 8695-8665 PT Time Calculation (min) (ACUTE ONLY): 30 min   Charges:   PT Evaluation $PT Eval High Complexity: 1 High   PT General Charges $$ ACUTE PT VISIT: 1 Visit         Camie SAUNDERS. Juli, PT, DPT, Cert. MDT 09/06/24, 2:27 PM  [facility] [address] [place], [place] [address] P: [phone] I F: [phone]

## 2024-09-06 NOTE — ED Notes (Signed)
 Meal tray given

## 2024-09-07 DIAGNOSIS — S0093XA Contusion of unspecified part of head, initial encounter: Secondary | ICD-10-CM | POA: Diagnosis not present

## 2024-09-07 MED ORDER — HYDRALAZINE HCL 10 MG PO TABS
10.0000 mg | ORAL_TABLET | Freq: Four times a day (QID) | ORAL | Status: DC | PRN
  Administered 2024-09-11: 10 mg via ORAL
  Filled 2024-09-07: qty 1

## 2024-09-07 NOTE — ED Notes (Signed)
 Bed alarm on, fall risk bracelet on, grip socks on, and call bell near pt

## 2024-09-07 NOTE — ED Notes (Signed)
 Placed patient's lunch order

## 2024-09-07 NOTE — ED Provider Notes (Addendum)
 7:30 AM on 09/07/2024: At the end of my shift, patient did have some elevated blood pressures and has not received any midodrine .  This is the opposite of what he previously had with blood pressures and 134/62 yesterday.  I have added some as needed hydralazine  and have relayed this to the oncoming physician that if blood pressures continue to be labile patient may meet admission criteria.  Of note patient remains asymptomatic at this time denies any chest pain shortness of breath.     ED Boarding Reassessment Note - Medical/SNF Hold Time/Date: 2:24 AM on 09/07/2024 Provider: Rolland Moats, MD    Event Date/Time   First MD Initiated Contact with Patient 09/06/24 2303     (approximate)   Reason for Re-evaluation: Continued ED stay pending post-acute placement: [SNF / rehab / home with services (undecided which is most appropriate)  Interval History: Patient remains in ED awaiting safe disposition. Reports [no new complaints].  Nursing notes [no acute events  Overnight events: [none   Objective:  Vitals:   09/07/24 0600 09/07/24 0615  BP: (!) 201/89 (!) 178/87  Pulse: 79 77  Resp:    Temp:    SpO2: 97% 97%    General: [alert / resting / asleep / oriented] Cardiac: WWP Lungs: No distress Neuro: grossly intact   Data Reviewed: Labs Reviewed  COMPREHENSIVE METABOLIC PANEL WITH GFR - Abnormal; Notable for the following components:      Result Value   BUN 26 (*)    Creatinine, Ser 1.45 (*)    Albumin 3.3 (*)    GFR, Estimated 49 (*)    All other components within normal limits  URINALYSIS, ROUTINE W REFLEX MICROSCOPIC - Abnormal; Notable for the following components:   Color, Urine YELLOW (*)    APPearance CLEAR (*)    All other components within normal limits  CBC  CBG MONITORING, ED    [facility] labs / imaging reviewed - none pending]  Has not be hypotensive.   Assessment: Patient medically stable, awaiting safe discharge Ongoing medical issues: near syncope events,  blood pressure    Plan: - Continue current medications. - Adjust plan as follows: monitor BP - Encourage oral intake and mobility as tolerated. - Maintain communication with case management regarding placement. - Will reassess PRN for any acute change.  Attending Role / Disposition: Patient remains in ED under emergency physician care pending transfer to post-acute facility. Ongoing active management provided as needed. TOC involved   Clinical Course as of 09/07/24 0731  Fri Sep 05, 2024  1536 BP(!): 142/76 Not low  [HD]  1546 Creatinine(!): 1.45 Creatinine seems to be at baseline with the patient [HD]  1556 Patient does want to stay as a boarder, asked pharmacy tech to med reconcile the patient [HD]  1834 Urinalysis, Routine w reflex microscopic -Urine, Clean Catch(!) Not consistent with UTI.  Will make the patient a bottle [HD]  Sun Sep 07, 2024  9285 Patient presented with lightheadedness and falls.  Usually on midodrin but not requiring for the past day while in the ER.  Currently boarding in the ER for placement.  History of SNF.  Labs reassuring.   Today now is hypertensive.  No chest pain or dyspnea.  Re-eval today given new HTN.  [SM]    Clinical Course User Index [HD] Moats Rolland BRAVO, MD [SM] Suzanne Kirsch, MD      Moats Rolland BRAVO, MD 09/07/24 9773    Moats Rolland BRAVO, MD 09/07/24 [phone]

## 2024-09-08 DIAGNOSIS — S0093XA Contusion of unspecified part of head, initial encounter: Secondary | ICD-10-CM | POA: Diagnosis not present

## 2024-09-08 LAB — CBG MONITORING, ED: Glucose-Capillary: 114 mg/dL — ABNORMAL HIGH (ref 70–99)

## 2024-09-08 NOTE — ED Notes (Signed)
 This tech assisted pt to ambulate to restroom at this time.

## 2024-09-08 NOTE — Progress Notes (Signed)
 Physical Therapy Treatment Patient Details Name: Dakota Harper MRN: [id number] DOB: [birth year] Today's Date: 09/08/2024   History of Present Illness Dakota Harper is a 78 y.o. male with medical history significant of hypertension, hyperlipidemia, hypothyroidism, CKD 3B, anemia, gout, COPD, BPH, pituitary adenoma status post decompression surgery, tremors, depression, obesity, OSA, orthostatic hypotension, chronic pain presenting with lightheadedness presents from EMS after fall.    PT Comments  Pt was supine in bed upon arrival. He is alert and cooperative. Agreeable to session and endorses living home alone and having friends drive him for his needs. Pt's BP at rest 160/74. He endorses need to urinate. Was able to exit bed, stand to RW + tolerate ambulation to toilet prior to ambulating in hallway. BP after ~ 10 minutes of OOB activity 164/83 (106). No symptoms of dizziness throughout session however pt does endorse feeling weak and does present with some mild tremors. Author encourages pt to consider STR at DC however pt seems heart set on Dcing directly home. If he remains unwilling to go to STR, HHPT would be suggested to maximize his independence and safety with all ADLs.    If plan is discharge home, recommend the following: Two people to help with walking and/or transfers;Two people to help with bathing/dressing/bathroom;Assist for transportation;Supervision due to cognitive status;Assistance with cooking/housework;Direct supervision/assist for medications management     Equipment Recommendations  Rolling walker (2 wheels);BSC/3in1       Precautions / Restrictions Precautions Precautions: Fall Recall of Precautions/Restrictions: Intact Precaution/Restrictions Comments: orthostatic precautions Restrictions Weight Bearing Restrictions Per Provider Order: No     Mobility  Bed Mobility Overal bed mobility: Needs Assistance Bed Mobility: Supine to Sit, Sit to Supine  Supine to  sit: Min assist Sit to supine: Supervision   Transfers Overall transfer level: Needs assistance Equipment used: Rolling walker (2 wheels) Transfers: Sit to/from Stand Sit to Stand: From elevated surface, Contact guard assist  General transfer comment: CGA for safety however no physical lifting assistance to stand    Ambulation/Gait Ambulation/Gait assistance: Contact guard assist, Supervision Gait Distance (Feet): 120 Feet Assistive device: Rolling walker (2 wheels) Gait Pattern/deviations: Step-through pattern Gait velocity: decreased  General Gait Details: Pt ambulated ~ 120 ft with RW. CGA for safety due to unsteadiness and several occasions of tremors. no c/o dizziness. I just feel weak.   Balance Overall balance assessment: Needs assistance Sitting-balance support: Bilateral upper extremity supported, Feet supported Sitting balance-Leahy Scale: Fair     Standing balance support: Reliant on assistive device for balance, During functional activity, Bilateral upper extremity supported Standing balance-Leahy Scale: Fair Standing balance comment: Reliant on BUE support however no formal LOB with intervention required       Communication Communication Communication: No apparent difficulties  Cognition Arousal: Alert Behavior During Therapy: WFL for tasks assessed/performed   PT - Cognitive impairments: No apparent impairments, Safety/Judgement, Problem solving    PT - Cognition Comments: Pt was supine on stretcher upon arrival. Following commands: Intact      Cueing Cueing Techniques: Verbal cues, Tactile cues         Pertinent Vitals/Pain Pain Assessment Pain Assessment: No/denies pain     PT Goals (current goals can now be found in the care plan section) Acute Rehab PT Goals Patient Stated Goal: get a RW prior to going home Progress towards PT goals: Progressing toward goals    Frequency    Min 2X/week           Co-evaluation  PT goals  addressed during session: Mobility/safety with mobility;Proper use of DME;Balance;Strengthening/ROM        AM-PAC PT 6 Clicks Mobility   Outcome Measure  Help needed turning from your back to your side while in a flat bed without using bedrails?: None Help needed moving from lying on your back to sitting on the side of a flat bed without using bedrails?: None Help needed moving to and from a bed to a chair (including a wheelchair)?: A Little Help needed standing up from a chair using your arms (e.g., wheelchair or bedside chair)?: A Little Help needed to walk in hospital room?: A Little Help needed climbing 3-5 steps with a railing? : A Little 6 Click Score: 20    End of Session   Activity Tolerance: Patient tolerated treatment well Patient left: in bed;with call bell/phone within reach;with chair alarm set Nurse Communication: Mobility status PT Visit Diagnosis: Other abnormalities of gait and mobility (R26.89);Repeated falls (R29.6);History of falling (Z91.81);Other symptoms and signs involving the nervous system (R29.898)     Time: 8683-8661 PT Time Calculation (min) (ACUTE ONLY): 22 min  Charges:    $Gait Training: 8-22 mins PT General Charges $$ ACUTE PT VISIT: 1 Visit                    Rankin Essex PTA 09/08/24, 2:25 PM

## 2024-09-08 NOTE — ED Provider Notes (Signed)
 Emergency Medicine Observation Re-evaluation Note   BP (!) 150/72   Pulse 76   Temp 97.6 F (36.4 C) (Oral)   Resp 18   Ht 5' 8 (1.727 m)   Wt 87.5 kg   SpO2 99%   BMI 29.35 kg/m    ED Course / MDM   No reported events during my shift at the time of this note.   Pt is awaiting dispo from consultants   Ginnie Shams MD    Shams Ginnie, MD 09/08/24 [phone]

## 2024-09-08 NOTE — ED Notes (Signed)
 Patient assisted to toilet and back to bed at this time. Pt ambulatory with standby assist and cane.

## 2024-09-09 DIAGNOSIS — S0093XA Contusion of unspecified part of head, initial encounter: Secondary | ICD-10-CM | POA: Diagnosis not present

## 2024-09-09 NOTE — Progress Notes (Signed)
 Occupational Therapy Treatment Patient Details Name: Dakota Harper MRN: [id number] DOB: 1946/05/14 Today's Date: 09/09/2024   History of present illness Dakota Harper is a 78 y.o. male with medical history significant of hypertension, hyperlipidemia, hypothyroidism, CKD 3B, anemia, gout, COPD, BPH, pituitary adenoma status post decompression surgery, tremors, depression, obesity, OSA, orthostatic hypotension, chronic pain presenting with lightheadedness presents from EMS after fall.   OT comments  Pt is supine in bed on arrival. Pleasant and agreeable to OT session. He denies pain. Pt performed bed mobility with Min to Mod A, increased time and effort. He reports increased weakness this date compared to yesterday. Notable tremors in BUEs when trying to utilize mobile phone, needs assist to answer calls. Poor standing balance with unilateral support from cane and no RW present in room, utilized HHA on L side from therapist and able to ambulate ~10 ft with Min A, however upon return to bed pt with anterior LOB with Mod A required to prevent fall and safely return to bed. He required increased time and cues with Min A to scoot to [facility] and reposition. Pt declining need for STR and wishing to return home with [facility] therapy and OT provided extensive education on need for rehab as he is a very high fall risk at this time during standing balance activities with only unilateral support.  Pt returned to bed with all needs in place and will cont to require skilled acute OT services to maximize his safety and IND to return to PLOF.       If plan is discharge home, recommend the following:  A lot of help with walking and/or transfers;A lot of help with bathing/dressing/bathroom;Assistance with cooking/housework;Assist for transportation   Equipment Recommendations  Other (comment);BSC/3in1;Tub/shower seat (defer to next venue)    Recommendations for Other Services      Precautions / Restrictions  Precautions Precautions: Fall Recall of Precautions/Restrictions: Intact Precaution/Restrictions Comments: orthostatic precautions Restrictions Weight Bearing Restrictions Per Provider Order: No       Mobility Bed Mobility Overal bed mobility: Needs Assistance Bed Mobility: Supine to Sit, Sit to Supine     Supine to sit: Min assist, HOB elevated, Mod assist Sit to supine: Mod assist   General bed mobility comments: Min/Mod A to perform all bed mobility this date with increased time and notable tremors, no c/o dizziness    Transfers Overall transfer level: Needs assistance Equipment used: Rolling walker (2 wheels) Transfers: Sit to/from Stand Sit to Stand: From elevated surface, Contact guard assist, Min assist           General transfer comment: Min/CGA for standing from high ED stretcher d/t posterior lean and poor balance, ambulated ~10 ft before returning to bed to prevent fall d/t anterior lean when utilizing only cane for support vs RW     Balance Overall balance assessment: Needs assistance Sitting-balance support: Bilateral upper extremity supported, Feet supported Sitting balance-Leahy Scale: Fair   Postural control: Posterior lean Standing balance support: Reliant on assistive device for balance, During functional activity, Bilateral upper extremity supported Standing balance-Leahy Scale: Poor Standing balance comment: poor balance with only unilateral support, improved with HHA x1 on L and cane use of R side, however noted with anterior LOB when only utilizing cane and pt assisted in to bed to prevent fall                           ADL either performed or assessed with clinical  judgement   ADL Overall ADL's : Needs assistance/impaired                                       General ADL Comments: anticipate max A for LB ADLs management for safety and Min A for UB ADLs d/t tremors    Extremity/Trunk Assessment               Vision       Perception     Praxis     Communication Communication Communication: No apparent difficulties   Cognition Arousal: Alert Behavior During Therapy: WFL for tasks assessed/performed                                 Following commands: Intact        Cueing   Cueing Techniques: Verbal cues, Tactile cues  Exercises Other Exercises Other Exercises: Edu on use of RW for BUE support to maximize balance/safety during transfers and mobility. Edu on need for assist at all times with ADLs to prevent falls.    Shoulder Instructions       General Comments constant tremors in BUEs, no c/o dizziness, reports increased weakness this date compared to yesterday    Pertinent Vitals/ Pain       Pain Assessment Pain Assessment: No/denies pain  Home Living                                          Prior Functioning/Environment              Frequency  Min 2X/week        Progress Toward Goals  OT Goals(current goals can now be found in the care plan section)  Progress towards OT goals: Progressing toward goals  Acute Rehab OT Goals Patient Stated Goal: improve balance and strength OT Goal Formulation: With patient Time For Goal Achievement: 09/20/24 Potential to Achieve Goals: Fair  Plan      Co-evaluation                 AM-PAC OT 6 Clicks Daily Activity     Outcome Measure   Help from another person eating meals?: A Little Help from another person taking care of personal grooming?: A Little Help from another person toileting, which includes using toliet, bedpan, or urinal?: A Lot Help from another person bathing (including washing, rinsing, drying)?: A Lot Help from another person to put on and taking off regular upper body clothing?: A Little Help from another person to put on and taking off regular lower body clothing?: A Lot 6 Click Score: 15    End of Session Equipment Utilized During Treatment: Gait belt  (SPC)  OT Visit Diagnosis: Unsteadiness on feet (R26.81);Other abnormalities of gait and mobility (R26.89);Repeated falls (R29.6);History of falling (Z91.81);Muscle weakness (generalized) (M62.81);Dizziness and giddiness (R42)   Activity Tolerance Patient tolerated treatment well   Patient Left in bed;with call bell/phone within reach   Nurse Communication          Time: 8596-8567 OT Time Calculation (min): 29 min  Charges: OT General Charges $OT Visit: 1 Visit OT Treatments $Therapeutic Activity: 23-37 mins  Griffen Frayne, OTR/L  09/09/24, 3:54 PM   Chae Oommen E Amyjo Mizrachi 09/09/2024, 3:51 PM

## 2024-09-09 NOTE — ED Provider Notes (Signed)
-----------------------------------------   7:29 AM on 09/09/2024 -----------------------------------------   Blood pressure (!) 174/95, pulse 73, temperature 97.7 F (36.5 C), temperature source Oral, resp. rate 20, height 1.727 m (5' 8), weight 87.5 kg, SpO2 100%.  The patient is calm and cooperative at this time.  There have been no acute events since the last update.  Awaiting disposition plan from [facility] team.   Gordan Huxley, MD 09/09/24 [phone]

## 2024-09-09 NOTE — ED Notes (Signed)
 Pt given the unit phone to call in his breakfast order.

## 2024-09-09 NOTE — ED Provider Notes (Signed)
 Emergency Medicine Observation Re-evaluation Note   BP (!) 174/95   Pulse 73   Temp 97.7 F (36.5 C) (Oral)   Resp 20   Ht 5' 8 (1.727 m)   Wt 87.5 kg   SpO2 100%   BMI 29.35 kg/m    ED Course / MDM   No reported events during my shift at the time of this note.   Pt is awaiting dispo from consultants   Ginnie Shams MD    Shams Ginnie, MD 09/09/24 [phone]

## 2024-09-09 NOTE — ED Notes (Addendum)
 Helped pt ambulate to the toilet and back to into the bed.

## 2024-09-09 NOTE — ED Notes (Signed)
 Pt used walker to get to toilet. Pt back in bed with bed alarm on, call light in reach, and stretcher locked in the lowest position.

## 2024-09-09 NOTE — TOC Progression Note (Signed)
 Transition of Care [facility]) - Progression Note    Patient Details  Name: Dakota Harper MRN: [id number] Date of Birth: 1946-07-12  [facility] CM/SW Contact  K'La JINNY Ruts, LCSW Phone Number: 09/09/2024, 4:26 PM  Clinical Narrative:    Chart reviewed. Patient needs to be informed about accepting SNF placement. Then auth needs be ran for identified SNF. The patient is currently in imaging.                      Expected Discharge Plan and Services                                               Social Drivers of Health (SDOH) Interventions SDOH Screenings   Food Insecurity: No Food Insecurity (07/31/2024)  Housing: Unknown (07/31/2024)  Transportation Needs: No Transportation Needs (07/31/2024)  Utilities: Not At Risk (07/31/2024)  Financial Resource Strain: Low Risk  (04/15/2024)   Received from [facility]  Social Connections: Unknown (07/31/2024)  Tobacco Use: Medium Risk (09/05/2024)    Readmission Risk Interventions     No data to display

## 2024-09-10 DIAGNOSIS — S0093XA Contusion of unspecified part of head, initial encounter: Secondary | ICD-10-CM | POA: Diagnosis not present

## 2024-09-10 NOTE — ED Notes (Signed)
 Pt called out wanting the remote for the television and the bedside table to moved closer to pt. Pt has no other needs at the moment.

## 2024-09-10 NOTE — Progress Notes (Signed)
 Physical Therapy Treatment Patient Details Name: Dakota Harper MRN: [id number] DOB: Aug 31, 1946 Today's Date: 09/10/2024   History of Present Illness Dakota Harper is a 78 y.o. male with medical history significant of hypertension, hyperlipidemia, hypothyroidism, CKD 3B, anemia, gout, COPD, BPH, pituitary adenoma status post decompression surgery, tremors, depression, obesity, OSA, orthostatic hypotension, chronic pain presenting with lightheadedness presents from EMS after fall.    PT Comments  Arrived to pt laying in bed with HOB raised. Pt stated that he didn't know if he had to use the bathroom or not but wanted to try. Pt was A and O x4 and was eager to do some walking. Pt's previous BP measure was very high so BP was monitored once during ambulation and was WNL. Pt completed bed transfer with Min A. Pt completed sit to stand transfer with CGA to RW. Completed stand to sit transfer to [facility] with CGA. Ambulated 120 ft with RW while CGA to showers. Pt was stronger in BLE but struggled at times with tremors with BUE. Discharge recs still appropriate.   If plan is discharge home, recommend the following: Assist for transportation;Supervision due to cognitive status;Assistance with cooking/housework;Direct supervision/assist for medications management;A lot of help with walking and/or transfers;A lot of help with bathing/dressing/bathroom;Direct supervision/assist for financial management   Can travel by private vehicle     No  Equipment Recommendations  Rolling walker (2 wheels);BSC/3in1    Recommendations for Other Services       Precautions / Restrictions Precautions Precautions: Fall Recall of Precautions/Restrictions: Intact Precaution/Restrictions Comments: orthostatic precautions Restrictions Weight Bearing Restrictions Per Provider Order: No     Mobility  Bed Mobility Overal bed mobility: Needs Assistance Bed Mobility: Supine to Sit     Supine to sit: Min assist, HOB  elevated     General bed mobility comments: Min A to perform all bed mobility this date with increased time and notable tremors, no c/o dizziness    Transfers Overall transfer level: Needs assistance Equipment used: Rolling walker (2 wheels) Transfers: Sit to/from Stand       General transfer comment: Posterior lean while on EOB. Tremors with BUE    Ambulation/Gait Ambulation/Gait assistance: Contact guard assist, Supervision Gait Distance (Feet): 120 Feet Assistive device: Rolling walker (2 wheels) Gait Pattern/deviations: Drifts right/left, Step-through pattern Gait velocity: decreased     General Gait Details: Pt ambulated 120 ft with RW. Veered left at times but noticed that it wasn't normal          Balance Overall balance assessment: Needs assistance Sitting-balance support: Bilateral upper extremity supported, Feet supported Sitting balance-Leahy Scale: Fair Sitting balance - Comments: CGA while sitting on EOB before ambulating Postural control: Posterior lean Standing balance support: Reliant on assistive device for balance, During functional activity, Bilateral upper extremity supported Standing balance-Leahy Scale: Poor Standing balance comment: Flexed posterior while standing bedside with RW         Communication Communication Communication: No apparent difficulties Factors Affecting Communication: Reduced clarity of speech  Cognition Arousal: Alert Behavior During Therapy: WFL for tasks assessed/performed   PT - Cognitive impairments: No apparent impairments, Safety/Judgement, Problem solving         PT - Cognition Comments: Pt was able to recall PTA's name from 2 days prior. He was agreeable and cooperable and wanted to get up and move. Following commands: Intact      Cueing Cueing Techniques: Verbal cues, Tactile cues  Exercises      General Comments General comments (skin integrity,  edema, etc.): Checked HR halfway through ambulation,  values were WNL      Pertinent Vitals/Pain Pain Assessment Pain Assessment: No/denies pain Pain Score: 0-No pain     PT Goals (current goals can now be found in the care plan section) Acute Rehab PT Goals Patient Stated Goal: get a RW prior to going home Progress towards PT goals: Progressing toward goals    Frequency    Min 2X/week       AM-PAC PT 6 Clicks Mobility   Outcome Measure  Help needed turning from your back to your side while in a flat bed without using bedrails?: A Little Help needed moving from lying on your back to sitting on the side of a flat bed without using bedrails?: A Little Help needed moving to and from a bed to a chair (including a wheelchair)?: A Little Help needed standing up from a chair using your arms (e.g., wheelchair or bedside chair)?: A Little Help needed to walk in hospital room?: A Little Help needed climbing 3-5 steps with a railing? : A Lot 6 Click Score: 17    End of Session Equipment Utilized During Treatment: Gait belt Activity Tolerance: Patient tolerated treatment well Patient left: Other (comment) (Ambulated to shower and left with nurses/techs) Nurse Communication: Mobility status PT Visit Diagnosis: Other abnormalities of gait and mobility (R26.89);Repeated falls (R29.6);History of falling (Z91.81);Other symptoms and signs involving the nervous system (R29.898)     Time: 8444-8383 PT Time Calculation (min) (ACUTE ONLY): 21 min  Charges:    $Gait Training: 8-22 mins PT General Charges $$ ACUTE PT VISIT: 1 Visit                     Signe Uziel Covault SPTA    Brooksie Ellwanger 09/10/2024, 5:16 PM

## 2024-09-10 NOTE — ED Notes (Signed)
 Pt received hospital lunch and drink.

## 2024-09-10 NOTE — ED Notes (Addendum)
 Pt received hospital breakfast and drink.

## 2024-09-10 NOTE — ED Notes (Signed)
 This tech and Dakota Harper EDT assisted pt with shower. Pt was scrubbed with soap and water, hair was washed/brushed, peri care performed, new gown/underwear/socks applied, and new linen placed on bed. Pt walked from decon shower to room with assistance from this tech and walker. Pt was assisted back into bed and readjusted. New blankets were placed on pt. Call bell in reach. No other needs verbalized at this time.

## 2024-09-10 NOTE — ED Notes (Signed)
 Pt ambulated using walker to use in room toilet. Pt back in bed with call bell in reach, bed in lowest locked position.

## 2024-09-10 NOTE — ED Notes (Signed)
 This tech assisted pt to ambulate to the restroom at this time.

## 2024-09-10 NOTE — TOC Progression Note (Addendum)
 Transition of Care [facility]) - Progression Note    Patient Details  Name: Dakota Harper MRN: [id number] Date of Birth: [birth year]  [facility] CM/SW Contact  Tedra Coppernoll L Ayden Hardwick, [place] Phone Number: 09/10/2024, 11:10 AM  Clinical Narrative:     CSW met with patient. No family at bedside. CSW reviewed SNF offers. Patient declining SNF. He stated that he can't go to rehab for three weeks (not sure where he received this information from) and eat the food at the facility. He reiterated that he can't eat the food.   CSW advised against discharge home and cited ongoing concerns and risk for imminent danger as patient lives alone. Patient stated that he is going home. He reported doing well with PT and would like HHPT set up. Patient provided no preferences for service agency.   Secure chat sent to provider. Patient advised of no DME needs. PT recommended a RW and a BSC. Patient declined and advised that he will get DME if needed once he discharges.   [facility] contacted for patient. [facility] is able to service patient.   Patient advised that he can request an UBER/LYFT when discharged. He also advised that he can call a friend to assist with transportation.                       Expected Discharge Plan and Services                                               Social Drivers of Health (SDOH) Interventions SDOH Screenings   Food Insecurity: No Food Insecurity (07/31/2024)  Housing: Unknown (07/31/2024)  Transportation Needs: No Transportation Needs (07/31/2024)  Utilities: Not At Risk (07/31/2024)  Financial Resource Strain: Low Risk  (04/15/2024)   Received from [facility]  Social Connections: Unknown (07/31/2024)  Tobacco Use: Medium Risk (09/05/2024)    Readmission Risk Interventions     No data to display

## 2024-09-10 NOTE — ED Provider Notes (Signed)
 Emergency Medicine Observation Re-evaluation Note   BP (!) 176/80 (BP Location: Right Arm)   Pulse 88   Temp 97.6 F (36.4 C) (Oral)   Resp 18   Ht 5' 8 (1.727 m)   Wt 87.5 kg   SpO2 100%   BMI 29.35 kg/m    ED Course / MDM   No reported events during my shift at the time of this note.   Pt is awaiting dispo from consultants   Ginnie Shams MD    Shams Ginnie, MD 09/10/24 [phone]

## 2024-09-11 DIAGNOSIS — S0093XA Contusion of unspecified part of head, initial encounter: Secondary | ICD-10-CM | POA: Diagnosis not present

## 2024-09-11 NOTE — ED Provider Notes (Signed)
 Patient seen and evaluated by me this morning.  He reports feeling well.  He likes the walker that our physical therapists used with him and plans to get 1 at home.  He has a cane and a rollator but he believes the rollator is too bulky.  He is declining placement to any assisted living facilities and is eager to go home.  He has a friend who can pick him up, drive him home, drive him to the grocery store and help around the house as needed.  Our social work has reached out to home health agency to assist the patient as well.  He reports having his prescriptions and is happy to go home today.  We discussed close ED return precautions.  Patient is suitable for outpatient management   Claudene Rover, MD 09/11/24 [phone]

## 2024-09-11 NOTE — Discharge Instructions (Signed)
 [facility] services have been arranged for you to receive PT, OT, RN, Aide and SW. They will contact you 24 to 48 hours after discharge.

## 2024-09-11 NOTE — ED Provider Notes (Signed)
-----------------------------------------   4:36 AM on 09/11/2024 -----------------------------------------   Blood pressure (!) 174/74, pulse 74, temperature 97.9 F (36.6 C), temperature source Oral, resp. rate 20, height 1.727 m (5' 8), weight 87.5 kg, SpO2 98%.  The patient is calm and cooperative at this time.  There have been no acute events since the last update.  Awaiting disposition plan from [facility] team.   Gordan Huxley, MD 09/11/24 [phone]

## 2024-09-11 NOTE — ED Notes (Signed)
 Pt able to get up independently and walk from bed to toilet with walker with steady gait. Pt able to stand up from toilet independently and walk back to the bed. Pt able to get in the bed with minimal assistance.

## 2024-09-20 ENCOUNTER — Emergency Department
Admission: EM | Admit: 2024-09-20 | Discharge: 2024-09-20 | Disposition: A | Discharge status: Home / Self Care | Attending: Emergency Medicine | Admitting: Emergency Medicine

## 2024-09-20 ENCOUNTER — Other Ambulatory Visit: Payer: Self-pay

## 2024-09-20 ENCOUNTER — Encounter: Payer: Self-pay | Admitting: Intensive Care

## 2024-09-20 ENCOUNTER — Emergency Department

## 2024-09-20 DIAGNOSIS — M545 Low back pain, unspecified: Secondary | ICD-10-CM | POA: Diagnosis present

## 2024-09-20 DIAGNOSIS — N1832 Chronic kidney disease, stage 3b: Secondary | ICD-10-CM | POA: Insufficient documentation

## 2024-09-20 DIAGNOSIS — J449 Chronic obstructive pulmonary disease, unspecified: Secondary | ICD-10-CM | POA: Diagnosis not present

## 2024-09-20 DIAGNOSIS — I5022 Chronic systolic (congestive) heart failure: Secondary | ICD-10-CM | POA: Diagnosis not present

## 2024-09-20 DIAGNOSIS — R053 Chronic cough: Secondary | ICD-10-CM | POA: Diagnosis not present

## 2024-09-20 DIAGNOSIS — Z85828 Personal history of other malignant neoplasm of skin: Secondary | ICD-10-CM | POA: Insufficient documentation

## 2024-09-20 DIAGNOSIS — D72829 Elevated white blood cell count, unspecified: Secondary | ICD-10-CM | POA: Diagnosis not present

## 2024-09-20 DIAGNOSIS — I130 Hypertensive heart and chronic kidney disease with heart failure and stage 1 through stage 4 chronic kidney disease, or unspecified chronic kidney disease: Secondary | ICD-10-CM | POA: Insufficient documentation

## 2024-09-20 LAB — CBC WITH DIFFERENTIAL/PLATELET
Abs Immature Granulocytes: 0.14 K/uL — ABNORMAL HIGH (ref 0.00–0.07)
Basophils Absolute: 0.1 K/uL (ref 0.0–0.1)
Basophils Relative: 1 %
Eosinophils Absolute: 0.3 K/uL (ref 0.0–0.5)
Eosinophils Relative: 3 %
HCT: 38.6 % — ABNORMAL LOW (ref 39.0–52.0)
Hemoglobin: 13 g/dL (ref 13.0–17.0)
Immature Granulocytes: 1 %
Lymphocytes Relative: 11 %
Lymphs Abs: 1.2 K/uL (ref 0.7–4.0)
MCH: 31.3 pg (ref 26.0–34.0)
MCHC: 33.7 g/dL (ref 30.0–36.0)
MCV: 92.8 fL (ref 80.0–100.0)
Monocytes Absolute: 0.7 K/uL (ref 0.1–1.0)
Monocytes Relative: 7 %
Neutro Abs: 8.5 K/uL — ABNORMAL HIGH (ref 1.7–7.7)
Neutrophils Relative %: 77 %
Platelets: 258 K/uL (ref 150–400)
RBC: 4.16 MIL/uL — ABNORMAL LOW (ref 4.22–5.81)
RDW: 14.4 % (ref 11.5–15.5)
WBC: 10.9 K/uL — ABNORMAL HIGH (ref 4.0–10.5)
nRBC: 0 % (ref 0.0–0.2)

## 2024-09-20 LAB — COMPREHENSIVE METABOLIC PANEL WITH GFR
ALT: 9 U/L (ref 0–44)
AST: 16 U/L (ref 15–41)
Albumin: 3.5 g/dL (ref 3.5–5.0)
Alkaline Phosphatase: 65 U/L (ref 38–126)
Anion gap: 8 (ref 5–15)
BUN: 19 mg/dL (ref 8–23)
CO2: 27 mmol/L (ref 22–32)
Calcium: 9 mg/dL (ref 8.9–10.3)
Chloride: 108 mmol/L (ref 98–111)
Creatinine, Ser: 1.24 mg/dL (ref 0.61–1.24)
GFR, Estimated: 60 mL/min — ABNORMAL LOW (ref >60)
Glucose, Bld: 87 mg/dL (ref 70–99)
Potassium: 3.7 mmol/L (ref 3.5–5.1)
Sodium: 142 mmol/L (ref 135–145)
Total Bilirubin: 0.4 mg/dL (ref 0.0–1.2)
Total Protein: 5.8 g/dL — ABNORMAL LOW (ref 6.5–8.1)

## 2024-09-20 LAB — RESP PANEL BY RT-PCR (RSV, FLU A&B, COVID)  RVPGX2
Influenza A by PCR: NEGATIVE
Influenza B by PCR: NEGATIVE
Resp Syncytial Virus by PCR: NEGATIVE
SARS Coronavirus 2 by RT PCR: NEGATIVE

## 2024-09-20 LAB — GROUP A STREP BY PCR: Group A Strep by PCR: NOT DETECTED

## 2024-09-20 LAB — PRO BRAIN NATRIURETIC PEPTIDE: Pro Brain Natriuretic Peptide: 496 pg/mL — ABNORMAL HIGH (ref <300.0)

## 2024-09-20 MED ORDER — FUROSEMIDE 40 MG PO TABS
20.0000 mg | ORAL_TABLET | Freq: Once | ORAL | Status: AC
  Administered 2024-09-20: 20 mg via ORAL
  Filled 2024-09-20: qty 1

## 2024-09-20 MED ORDER — LIDOCAINE 5 % EX PTCH
1.0000 | MEDICATED_PATCH | CUTANEOUS | Status: DC
  Filled 2024-09-20: qty 1

## 2024-09-20 MED ORDER — LIDOCAINE VISCOUS HCL 2 % MT SOLN
15.0000 mL | Freq: Once | OROMUCOSAL | Status: AC
  Administered 2024-09-20: 15 mL via ORAL
  Filled 2024-09-20: qty 15

## 2024-09-20 MED ORDER — IPRATROPIUM-ALBUTEROL 0.5-2.5 (3) MG/3ML IN SOLN
3.0000 mL | Freq: Once | RESPIRATORY_TRACT | Status: AC
  Administered 2024-09-20: 3 mL via RESPIRATORY_TRACT
  Filled 2024-09-20: qty 3

## 2024-09-20 MED ORDER — HYDROCODONE-ACETAMINOPHEN 5-325 MG PO TABS
1.0000 | ORAL_TABLET | Freq: Once | ORAL | Status: AC
  Administered 2024-09-20: 1 via ORAL
  Filled 2024-09-20: qty 1

## 2024-09-20 MED ORDER — HYDROCODONE-ACETAMINOPHEN 5-325 MG PO TABS: 1.0000 | ORAL_TABLET | Freq: Four times a day (QID) | ORAL | 0 refills | Status: AC | PRN

## 2024-09-20 MED ORDER — IBUPROFEN 600 MG PO TABS
600.0000 mg | ORAL_TABLET | Freq: Once | ORAL | Status: DC
  Filled 2024-09-20: qty 1

## 2024-09-20 MED ORDER — FUROSEMIDE 20 MG PO TABS: 20.0000 mg | ORAL_TABLET | Freq: Every day | ORAL | 0 refills | Status: DC

## 2024-09-20 MED ORDER — ALUM & MAG HYDROXIDE-SIMETH 200-200-20 MG/5ML PO SUSP
30.0000 mL | Freq: Once | ORAL | Status: AC
  Administered 2024-09-20: 30 mL via ORAL
  Filled 2024-09-20: qty 30

## 2024-09-20 MED ORDER — PSEUDOEPH-BROMPHEN-DM 30-2-10 MG/5ML PO SYRP: 5.0000 mL | ORAL_SOLUTION | Freq: Four times a day (QID) | ORAL | 0 refills | Status: AC | PRN

## 2024-09-20 NOTE — ED Provider Notes (Signed)
 [facility] Provider Note    Event Date/Time   First MD Initiated Contact with Patient 09/20/24 1523     (approximate)   History   Harper Throat    HPI  Dakota Harper is a 78 y.o. male    with a past medical history of frequent Harper, Dakota Harper, Dakota Harper, Dakota Harper, Dakota Harper, Dakota Harper, Dakota Harper, Dakota Harper throat and Dakota Harper Harper in the spine. According to the patient, symptoms started 2 days ago with Harper throat, cough.  Patient denies fever, chills, shortness of breath, chest Harper, abdominal Harper or diarrhea.  Patient is taking hydrocodone  for Harper throat but is not working.  Patient also endorses having lumbar back Harper secondary to Dakota Harper.  Patient endorses lumbar back Harper increased when he is sitting down.  He  has been seen in [place] [facility] for his lumbar back Harper, he is getting injections.  Patient has history of COPD.  Patient is here by himself.  Patient lives at home by himself.    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/27/2020   Ingrown left big toenail 10/25/2020   Ingrown nail of great toe of right foot 10/25/2020   Hypothyroidism 11/25/2019   BPH (benign prostatic hyperplasia) 11/25/2019   Headache 11/25/2019   Tremors of nervous system 11/25/2019   Thrombocytosis 11/25/2019   Elevated bilirubin 11/25/2019   OSA (obstructive sleep apnea) 11/14/2017   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   Self-injurious behavior 10/18/2015   Suicidal ideation  10/18/2015   History of substance use 10/17/2015   Generalized abdominal Harper 10/07/2015   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   Eczema 06/24/2015   Plantar fasciitis, left 11/19/2014   Chest Harper- musculoskeletal 07/03/2014   Leg Harper, bilateral 07/03/2014   Varicose veins 07/03/2014   Shoulder bursitis 07/02/2014   Cholelithiasis 01/14/2014   Cataract cortical, senile 01/09/2014   Meibomian gland dysfunction (MGD), bilateral, both upper and lower lids 01/09/2014   Pinguecula of both eyes 01/09/2014   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   Obesity (BMI 35.0-39.9 without comorbidity) 09/26/2013   Dakota Harper (HCC) 09/22/2013   Gout 09/18/2013   Allergic bronchitis 08/30/2013   Chronic cough 07/18/2013   Pruritic disorder 06/14/2012   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   Insomnia 09/02/2010   Recurrent depressive disorder 01/30/2008   Nonorganic sleep disorder 01/30/2008   Hypercholesterolemia 09/03/2007   Hypertension, benign 09/03/2007     Physical Exam   Triage Vital Signs: ED Triage Vitals  Encounter Vitals Group     BP 09/20/24 1506 (!) 148/71     Girls Systolic BP Percentile --      Girls Diastolic BP Percentile --      Boys Systolic BP Percentile --      Boys Diastolic BP Percentile --      Pulse Rate 09/20/24 1506 88     Resp 09/20/24 1506 16     Temp 09/20/24 1506 98.6 F (37 C)     Temp Source 09/20/24 1506 Oral     SpO2 09/20/24  1506 98 %     Weight 09/20/24 1508 192 lb (87.1 kg)     Height 09/20/24 1508 5' 8 (1.727 m)     Head Circumference --      Peak Flow --      Harper Score 09/20/24 1508 8     Harper Loc --      Harper Education --      Exclude from Growth Chart --     Most recent vital signs: Vitals:   09/20/24 1506  BP: (!) 148/71  Pulse: 88  Resp: 16  Temp: 98.6 F (37 C)  SpO2: 98%     Physical Exam Vitals and nursing note reviewed.  During triage patient had systolic hypertension  General:          Awake, no distress.   No difficulty breathing, no use of accessory muscles. Throat: Peritonsillar erythema, tonsillar enlargement.  Presence of small vesicles in the tonsils. CV:                  Good peripheral perfusion.  Resp:               Normal effort. no tachypnea.  Bilateral inspiratory and expiratory crackles.  No wheezing. Abd:                 No distention.  Soft nontender             ED Results / Procedures / Treatments   Labs (all labs ordered are listed, but only abnormal results are displayed) Labs Reviewed  COMPREHENSIVE METABOLIC PANEL WITH GFR - Abnormal; Notable for the following components:      Result Value   Total Protein 5.8 (*)    GFR, Estimated 60 (*)    All other components within normal limits  CBC WITH DIFFERENTIAL/PLATELET - Abnormal; Notable for the following components:   WBC 10.9 (*)    RBC 4.16 (*)    HCT 38.6 (*)    Neutro Abs 8.5 (*)    Abs Immature Granulocytes 0.14 (*)    All other components within normal limits  PRO BRAIN NATRIURETIC PEPTIDE - Abnormal; Notable for the following components:   Pro Brain Natriuretic Peptide 496.0 (*)    All other components within normal limits  GROUP A STREP BY PCR  RESP PANEL BY RT-PCR (RSV, FLU A&B, COVID)  [id number]    PROCEDURES:  Critical Care performed:   Procedures   MEDICATIONS ORDERED IN ED: Medications  ibuprofen (ADVIL) tablet 600 mg (600 mg Oral Not Given 09/20/24 1621)  lidocaine  (LIDODERM ) 5 % 1 patch (1 patch Transdermal Patient Refused/Not Given 09/20/24 1621)  furosemide  (LASIX ) tablet 20 mg (has no administration in time range)  HYDROcodone -acetaminophen  (NORCO/VICODIN) 5-325 MG per tablet 1 tablet (has no administration in time range)  ipratropium-albuterol  (DUONEB) 0.5-2.5 (3) MG/3ML nebulizer solution 3 mL (3 mLs Nebulization Given 09/20/24 1621)  alum & mag hydroxide-simeth (MAALOX/MYLANTA) 200-200-20 MG/5ML suspension 30 mL (30 mLs Oral Given 09/20/24 1620)    And  lidocaine  (XYLOCAINE ) 2 % viscous  mouth solution 15 mL (15 mLs Oral Given 09/20/24 1619)   Clinical Course as of 09/20/24 1837  Sat Sep 20, 2024  1533 DG Chest 1 View  No acute cardiopulmonary process. 2. Chronic linear scarring or atelectasis at the left lung base.   [AE]  1624 Group A Strep by PCR ([facility] Only) Negative [AE]  1624 Resp panel by RT-PCR (RSV, Flu A&B, Covid) Anterior Dakota Swab Negative [AE]  1813 CBC with Differential(!) White blood cells elevated 10.9, hemoglobin 13, neutrophils elevated 8.5. [AE]  1814 Comprehensive metabolic panel(!) GFR 60, improved when compared with eGFR of 2 weeks ago  (49) [AE]  1815 Pro Brain Natriuretic Peptide(!): 496.0 [AE]  1816 Pro Brain natriuretic peptide(!) Elevated, 496 [AE]  1817 Assess the patient with report of labs.  Patient is going to have a doses of furosemide  and Norco for his lumbar back Harper before discharge.  Patient is going to be discharged with prescription for furosemide  and Norco for 2 days.  Patient endorses he is going to have an appointment with Harper management on Tuesday.  I did advise patient to have a follow-up appointment with CHF clinic.  Patient is agreeable with the plan and is requesting cough syrup. [AE]    Clinical Course User Index [AE] Janit Kast, PA-C    IMPRESSION / MDM / ASSESSMENT AND PLAN / ED COURSE  I reviewed the triage vital signs and the nursing notes.  Differential diagnosis includes, but is not limited to, strep A, viral Dakota Harper, COPD exacerbation, pneumonia, lumbar strain.   Patient's presentation is most consistent with acute complicated illness / injury requiring diagnostic workup.   Antwon Rochin Wellnitz is a 78 y.o., male Dakota presents today with history of 2 days of Harper throat, productive cough.  Patient denies fever, chills, Dakota congestion, chest Harper, abdominal Harper, diarrhea.  With a physical exam patient had systolic hypertension during triage.  Patient is afebrile, no tachypnea, no tachycardia.  No signs  of difficulty breathing, no use of accessory muscles.  During physical exam evidence of active productive cough.  Throat presence of peritonsillar erythema, tonsillar enlargement, presence of vesicles in the tonsils.  Pulmonary auscultation presents with inspiratory and expiratory crackles, no wheezing.  Rest of the physical exam is normal. Plan DuoNeb Maalox with lidocaine  Lidocaine  patch  Waiting for results of respiratory panel and group A strep. Reassess Reassessed the patient after DuoNeb treatment and Maalox with lidocaine .  Patient endorses feeling better from his Harper throat.  I will order CBC, CMP, BNP. BMP came back elevated at 409.  Updated patient with results, patient endorses he's been seen by cardiac heart failure clinic.  Patient is going to receive furosemide  before discharge.  Patient endorses lumbar Harper.  Patient is going to have Harper management appointment on Tuesday.  I will prescribe Norco before discharge due to pharmacy hours.  Patient's diagnosis is consistent with this compensated cardiac heart failure, cough, Harper throat secondary to cough.  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 I independently reviewed and interpreted imaging and agree with radiologists findings ruling out pneumonia.  Labs are  reassuring. I did review the patient's allergies and medications.The patient is in stable and satisfactory condition for discharge home  Patient will be discharged home with prescriptions for furosemide , Norco, cough syrup. Patient is to follow up with Harper management clinic, cardiac heart failure clinic, PCP as needed or otherwise directed. Patient is given ED precautions to return to the ED for any worsening or new symptoms. Discussed plan of care with patient, answered all of patient's questions, Patient agreeable to plan of care. Advised patient to take medications according to the instructions on the label. Discussed possible side effects of new medications. Patient  verbalized understanding.  FINAL CLINICAL IMPRESSION(S) / ED DIAGNOSES   Final diagnoses:  Chronic systolic congestive heart failure (HCC)  Chronic cough  Lumbar back Harper     Rx / DC Orders  ED Discharge Orders          Ordered    brompheniramine-pseudoephedrine-DM 30-2-10 MG/5ML syrup  4 times daily PRN        09/20/24 1837    furosemide  (LASIX ) 20 MG tablet  Daily        09/20/24 1837    HYDROcodone -acetaminophen  (NORCO/VICODIN) 5-325 MG tablet  Every 6 hours PRN        09/20/24 1837             Note:  This document was prepared using Dragon voice recognition software and may include unintentional dictation errors.   Janit Kast, PA-C 09/20/24 1837    Waymond Lorelle Cummins, MD 09/20/24 [phone]

## 2024-09-20 NOTE — ED Triage Notes (Signed)
 Patient arrived by [place]. C/o sore throat and arthritis pain located in spine. Also reports cough, congestion and sore throat.

## 2024-09-20 NOTE — Discharge Instructions (Addendum)
 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  Please take Lasix  1 tablet by mouth daily.  You can take cough syrup 4 times a day as needed.  You can take Norco every 6 hours as needed for pain.  Please go to your appointment on Tuesday with pain management.  Please make an appointment with cardiac heart failure clinic for a follow-up.  Please come back to ED or go to your PCP if you have new symptoms symptoms worsen.

## 2024-09-22 ENCOUNTER — Ambulatory Visit: Admitting: Dermatology

## 2024-09-22 ENCOUNTER — Encounter: Payer: Self-pay | Admitting: Dermatology

## 2024-09-22 DIAGNOSIS — L578 Other skin changes due to chronic exposure to nonionizing radiation: Secondary | ICD-10-CM | POA: Diagnosis not present

## 2024-09-22 DIAGNOSIS — Z86007 Personal history of in-situ neoplasm of skin: Secondary | ICD-10-CM

## 2024-09-22 DIAGNOSIS — L209 Atopic dermatitis, unspecified: Secondary | ICD-10-CM | POA: Diagnosis not present

## 2024-09-22 DIAGNOSIS — D229 Melanocytic nevi, unspecified: Secondary | ICD-10-CM

## 2024-09-22 DIAGNOSIS — B8801 Infestation by demodex mites: Secondary | ICD-10-CM

## 2024-09-22 DIAGNOSIS — L821 Other seborrheic keratosis: Secondary | ICD-10-CM

## 2024-09-22 DIAGNOSIS — W908XXA Exposure to other nonionizing radiation, initial encounter: Secondary | ICD-10-CM | POA: Diagnosis not present

## 2024-09-22 DIAGNOSIS — L299 Pruritus, unspecified: Secondary | ICD-10-CM

## 2024-09-22 DIAGNOSIS — B353 Tinea pedis: Secondary | ICD-10-CM

## 2024-09-22 DIAGNOSIS — D225 Melanocytic nevi of trunk: Secondary | ICD-10-CM

## 2024-09-22 DIAGNOSIS — L2081 Atopic neurodermatitis: Secondary | ICD-10-CM

## 2024-09-22 MED ORDER — PERMETHRIN 5 % EX CREA: TOPICAL_CREAM | CUTANEOUS | 6 refills | Status: AC

## 2024-09-22 MED ORDER — CICLOPIROX OLAMINE 0.77 % EX CREA: TOPICAL_CREAM | Freq: Two times a day (BID) | CUTANEOUS | 1 refills | Status: AC

## 2024-09-22 MED ORDER — CICLOPIROX 1 % EX SHAM
MEDICATED_SHAMPOO | CUTANEOUS | 6 refills | Status: AC
Start: 2024-09-22 — End: ?

## 2024-09-22 NOTE — Progress Notes (Signed)
 Follow-Up Visit   Subjective  Dakota Harper is a 78 y.o. 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  Objective  Well appearing patient in no apparent distress; mood and affect are within normal limits.   A focused examination was performed of the following areas: Scalp, arms, back, left foot  Relevant exam findings are noted in the Assessment and Plan.    Assessment & Plan   HISTORY OF SQUAMOUS CELL CARCINOMA IN SITU OF THE SKIN - No evidence of recurrence today- L post crown scalp - Recommend regular full body skin exams - Recommend daily broad spectrum sunscreen SPF 30+ to sun-exposed areas, reapply every 2 hours as needed.  - Call if any new or changing lesions are noted between office visits    ACTINIC DAMAGE - chronic, secondary to cumulative UV radiation exposure/sun exposure over time - diffuse scaly erythematous macules with underlying dyspigmentation - Recommend daily broad spectrum sunscreen SPF 30+ to sun-exposed areas, reapply every 2 hours as needed.  - Recommend staying in the shade or wearing long sleeves, sun glasses (UVA+UVB protection) and wide brim hats (4-inch brim around the entire circumference of the hat). - Call for new or changing lesions.   ATOPIC DERMATITIS Trunk, extremities Started Dupixent  06/2024 Exam: Clear today, pt still c/o itching, although not as bad <1% BSA  Chronic and persistent condition with duration or expected duration over  one year. Condition is symptomatic/ bothersome to patient. Not currently at goal.   Atopic dermatitis (eczema) is a chronic, relapsing, pruritic condition that can significantly affect quality of life. It is often associated with allergic rhinitis and/or asthma and can require treatment with topical medications, phototherapy, or in severe cases biologic injectable medication (Dupixent ; Adbry) or Oral JAK inhibitors.  Treatment Plan: Pt would like to d/c Dupixent  due to he does not think it is helping as well as he would like it. Pt asked about Rinvoq.  Do not recommend it due to recent diagnosis of CHF. Cont Sarna lotion qd/bid, pt states works as well as anything he has used for itching.  Recommend gentle skin care.  SCALP PRURITUS scalp Exam: Mild erythema scalp  Chronic and persistent condition with duration or expected duration over one year. Condition is symptomatic/ bothersome to patient. Not currently at goal.   Treatment Plan: Start Ciclopirox shampoo 3x/wk to wash scalp, let sit 5 minutes and rinse out Cont Permethrin  5% cr qhs to aa scalp to treat Demodex.  Pt states helps with itching if he uses it daily Start Sarna lotion prn itching  TINEA PEDIS feet Exam:mild erythema and scale around the toes. Pt c/o feet itching  Treatment Plan: Start Ciclopirox cr bid to feet for 4 weeks  SEBORRHEIC KERATOSIS back - Stuck-on, waxy, tan-brown papules and/or plaques  - Benign-appearing - Discussed benign etiology and prognosis. - Observe - Call for any changes  MELANOCYTIC NEVI back Exam: Tan-brown and/or pink-flesh-colored symmetric macules and papules  Treatment Plan: Benign appearing on exam today. Recommend observation. Call  clinic for new or changing moles. Recommend daily use of broad spectrum spf 30+ sunscreen to sun-exposed areas.   INFESTATION BY DEMODEX   Related Medications permethrin  (ELIMITE ) 5 % cream qhs to scalp for itching  Return in about 6 months  (around 03/22/2025) for UBSE, Hx of SCC IS.  I, Sonya Hupman, RMA, am acting as scribe for Rexene Rattler, MD .   Documentation: I have reviewed the above documentation for accuracy and completeness, and I agree with the above.  Rexene Rattler, MD

## 2024-09-22 NOTE — Patient Instructions (Signed)

## 2024-09-25 ENCOUNTER — Other Ambulatory Visit: Payer: Self-pay | Admitting: Sports Medicine

## 2024-09-25 ENCOUNTER — Encounter: Payer: Self-pay | Admitting: *Deleted

## 2024-09-25 DIAGNOSIS — W19XXXA Unspecified fall, initial encounter: Secondary | ICD-10-CM

## 2024-09-25 DIAGNOSIS — M4698 Unspecified inflammatory spondylopathy, sacral and sacrococcygeal region: Secondary | ICD-10-CM

## 2024-09-25 DIAGNOSIS — M545 Low back pain, unspecified: Secondary | ICD-10-CM

## 2024-09-25 DIAGNOSIS — M533 Sacrococcygeal disorders, not elsewhere classified: Secondary | ICD-10-CM

## 2024-09-25 DIAGNOSIS — M51369 Other intervertebral disc degeneration, lumbar region without mention of lumbar back pain or lower extremity pain: Secondary | ICD-10-CM

## 2024-09-25 DIAGNOSIS — M47816 Spondylosis without myelopathy or radiculopathy, lumbar region: Secondary | ICD-10-CM

## 2024-09-26 ENCOUNTER — Ambulatory Visit: Admitting: Cardiovascular Disease

## 2024-10-08 ENCOUNTER — Ambulatory Visit: Attending: Medical | Admitting: Medical

## 2024-10-08 ENCOUNTER — Ambulatory Visit

## 2024-10-08 ENCOUNTER — Encounter: Payer: Self-pay | Admitting: Medical

## 2024-10-08 DIAGNOSIS — R9431 Abnormal electrocardiogram [ECG] [EKG]: Secondary | ICD-10-CM

## 2024-10-08 DIAGNOSIS — R55 Syncope and collapse: Secondary | ICD-10-CM

## 2024-10-08 DIAGNOSIS — R296 Repeated falls: Secondary | ICD-10-CM | POA: Diagnosis not present

## 2024-10-08 DIAGNOSIS — I10 Essential (primary) hypertension: Secondary | ICD-10-CM

## 2024-10-08 DIAGNOSIS — I9589 Other hypotension: Secondary | ICD-10-CM

## 2024-10-08 DIAGNOSIS — Z79899 Other long term (current) drug therapy: Secondary | ICD-10-CM

## 2024-10-08 DIAGNOSIS — R0602 Shortness of breath: Secondary | ICD-10-CM

## 2024-10-08 DIAGNOSIS — I5032 Chronic diastolic (congestive) heart failure: Secondary | ICD-10-CM

## 2024-10-08 DIAGNOSIS — E782 Mixed hyperlipidemia: Secondary | ICD-10-CM

## 2024-10-08 DIAGNOSIS — R42 Dizziness and giddiness: Secondary | ICD-10-CM

## 2024-10-08 DIAGNOSIS — R600 Localized edema: Secondary | ICD-10-CM

## 2024-10-08 MED ORDER — FUROSEMIDE 20 MG PO TABS: 20.0000 mg | ORAL_TABLET | Freq: Every day | ORAL | 3 refills | Status: DC

## 2024-10-08 NOTE — Progress Notes (Unsigned)
 Cardiology Office Note   Date:  10/08/2024  ID:  Joshva, Labreck October 05, 1946, MRN [id number] PCP: [facility]  [facility] Providers Cardiologist:  Deatrice Cage, MD   History of Present Illness YARIEL FERRARIS is a 78 y.o. male with a h/o HTN, orthostatic hypotension, hypothyroidism, h/o pituitary adenoma, HLD, CKD stage 3, bipolar disorder and obesity who presents for follow-up.   He was referred for abnormal EKG. He has a known h/o chronic atypical cheset pain and was briefly hospitalized in 2023 for this. MPI showed no evidence of ischemia with normal EF. He was referred due to abnormal EKG showing RBBB and LAFB. He denies any symptoms.   He was last seen 01/2024 and was stable form a cardiac perspective. Echo showed LVEF 60-65%, normal wall motion. G1DD.   he has h/o orthostatic hypotension on Florinef  since 2023. In the setting of pituitary adenoma s/p decompression surgery.   The patient was admitted 07/2024  with near syncope, but did not lose consciousness. CT head and CT spine were negative. EKG was stable. He was started on midodrine  as needed. He was continued on Florinef .   He saw endocrinology 08/14/24 who saw 1cm pituitary adenoma, likely non-functional. Plan to wait another 3-5 years for imaging.    Most recent fall was 09/05/24 at which time he went to the ER. BP was actually elevated at that time. They recommended rehab, but the patient ended up going back home. Patient is doing PT at home. Form his falls, he started experiencing back pain and saw ortho who noted compression fracture vs arthritis vs muscle pain. MRI was ordered  Today, the patient reports three falls in the last month, he denies significant injuries and no LOC. When he has fallen, he feels too weak to stand, starts shaking and starts falling.  This occurs mainly when he stands up. No chest pain, SOB, or palpitations. Orthostatics are positive today.  He lives by himself.  He uses a cane or walker. He is still doing PT and feels this is helping.  He has not triued compression socks, abdominal binder. He eats salt occasionally. Hehs been taking losartan , midodrine  and ?florinef  as needed.   Studies Reviewed EKG Interpretation Date/Time:  Wednesday October 08 2024 11:39:26 EST Ventricular Rate:  86 PR Interval:  204 QRS Duration:  170 QT Interval:  428 QTC Calculation: 512 R Axis:   -81  Text Interpretation: Normal sinus rhythm Right bundle branch block Left anterior fascicular block Bifascicular block When compared with ECG of 05-Sep-2024 13:06, No significant change was found Confirmed by Franchester, Javon Hupfer ([id number]) on 10/08/2024 11:51:08 AM    Echo 03/2024  1. Left ventricular ejection fraction, by estimation, is 60 to 65%. Left  ventricular ejection fraction by PLAX is 62 %. The left ventricle has  normal function. The left ventricle has no regional wall motion  abnormalities. Left ventricular diastolic  parameters are consistent with Grade I diastolic dysfunction (impaired  relaxation).   2. Right ventricular systolic function is normal. The right ventricular  size is normal.   3. The mitral valve is normal in structure. No evidence of mitral valve  regurgitation. No evidence of mitral stenosis.   4. The aortic valve is normal in structure. There is mild calcification  of the aortic valve. Aortic valve regurgitation is not visualized. Aortic  valve sclerosis is present, with no evidence of aortic valve stenosis.   5. The inferior vena cava is normal in size with greater  than 50%  respiratory variability, suggesting right atrial pressure of 3 mmHg.   Echo 06/2022  1. Left ventricular ejection fraction, by estimation, is 55 to 60%. The  left ventricle has normal function. The left ventricle has no regional  wall motion abnormalities. There is mild left ventricular hypertrophy.  Left ventricular diastolic parameters  are consistent with Grade I diastolic  dysfunction (impaired relaxation).   2. Right ventricular systolic function is normal. The right ventricular  size is not well visualized.   3. The mitral valve is normal in structure. No evidence of mitral valve  regurgitation.   4. The aortic valve was not well visualized. Aortic valve regurgitation  is not visualized.   MPI 07/2022    The study is normal. The study is low risk.   No ST deviation was noted.   LV perfusion is normal. There is no evidence of ischemia. There is no evidence of infarction.   Left ventricular function is normal. Nuclear stress EF: 74 %. The left ventricular ejection fraction is hyperdynamic (>65%). End diastolic cavity size is normal. End systolic cavity size is normal.     Physical Exam VS:  There were no vitals taken for this visit.  Orthostatic VS for the past 24 hrs (Last 3 readings):  BP- Lying Pulse- Lying BP- Sitting Pulse- Sitting BP- Standing at 0 minutes Pulse- Standing at 0 minutes BP- Standing at 3 minutes Pulse- Standing at 3 minutes  10/08/24 1145 155/80 86 113/73 87 96/56 83 (!) 83/54 95      Wt Readings from Last 3 Encounters:  09/20/24 192 lb (87.1 kg)  09/05/24 193 lb (87.5 kg)  08/02/24 191 lb 5.8 oz (86.8 kg)    GEN: Well nourished, well developed in no acute distress NECK: No JVD; No carotid bruits CARDIAC: RRR, no murmurs, rubs, gallops RESPIRATORY:  Clear to auscultation without rales, wheezing or rhonchi  ABDOMEN: Soft, non-tender, non-distended EXTREMITIES:  No edema; No deformity   ASSESSMENT AND PLAN  Pre-syncope Falls Orthostatic hypotension HTN Apparently, patient has a history of orthostatic hypotension on Florinef  dating back to 2023, in the setting of pituitary adenoma status post decompression surgery.  Patient has been admitted to the hospital and gone to the ER for falls over the last few months.  He was admitted in September for presyncope, CT of the head and neck was unremarkable.  EKG was stable.  Midodrine  as  needed was added onto Florinef .  Since that time he has had recurrent falls and gone to the ER.  In the ER blood pressure has been high.  It was recommended he be discharged to a rehab facility, but patient wanted to go home.  Patient has persistent presyncopal symptoms when he stands.  Orthostatics today are positive.  EKG shows normal sinus rhythm with right bundle branch block and LAFB.  I am unsure if patient knows what he is taking, but he tells me he takes midodrine  2.5 mg BID and Florinef  if SBP is less than 120.  He says he is also taking losartan  25 mg daily.  I recommended compression socks/thigh-high socks, abdominal binder.  I will stop losartan .  Patient is currently doing PT/home health and feels this is helping.  He has a cane or walker to use at home.  He does live alone.  I will also order a 2-week heart monitor, carotid ultrasound, and limited echocardiogram to be can complete.  I will check a magnesium  and a TSH.  Lower leg edema HFpEF  Patient was in the ER 09/20/2024 and pro-BNP was elevated to the 400s.  He was given Lasix  to take for 7 days.  Patient has lower leg edema on exam.  Update echocardiogram as above and I will give Lasix  20 mg to take daily.  Bmet in 2 weeks  RBBB LAFB Echo 03/2024 showed normal EF and no WMA. EKG today is stable.   HLD Patient needs updated lipid panel. Continue Lipitor 10mg  daily, fenofibrate  54mg  daily.        Dispo: Follow-up in 2 months  Signed, Miakoda Mcmillion VEAR Fishman, PA-C

## 2024-10-08 NOTE — Patient Instructions (Signed)
 Medication Instructions:  Your physician recommends the following medication changes.  STOP TAKING: Losartan   START TAKING: Lasix  20 mg daily  *If you need a refill on your cardiac medications before your next appointment, please call your pharmacy*  Lab Work: Your provider would like for you to have following labs drawn today TSH and Mag.    Your provider would like for you to return in 2 weeks to have the following labs drawn: BMeT.   Please go to [facility] [address] ([facility]) [address], [place] [address] You do not need an appointment.  They are open from 8 am- 4:30 pm.  Lunch from 1:00 pm- 2:00 pm You DO NOT need to be fasting.   You may also go to one of the following [facility]:  [address] [place], [place] [address] Phone: [phone] Lab hours: Mon-Fri 8 am- 5 pm    Lunch 12 pm- 1 pm  [address] [place],  [place]  [address]  [place]  Phone: [phone] Lab hours: 7 am- 4 pm Lunch 12 pm-1 pm   [address] [place],  [place]  [address]  [place]  Phone: [phone] Lab hours: Mon-Fri 8 am- 5 pm    Lunch 12 pm- 1 pm   Testing/Procedures: Your physician has requested that you have a carotid duplex. This test is an ultrasound of the carotid arteries in your neck. It looks at blood flow through these arteries that supply the brain with blood.   Allow one hour for this exam.  There are no restrictions or special instructions.  This will take place at [address] ([facility]) [phone], [place] [address]  Your physician has requested that you have an LIMITED echocardiogram. Echocardiography is a painless test that uses sound waves to create images of your heart. It provides your doctor with information about the size and shape of your heart and how well your heart's chambers and valves are working.   You may receive an ultrasound enhancing agent through an IV if needed to better visualize your heart during the echo. This  procedure takes approximately one hour.  There are no restrictions for this procedure.  This will take place at [address] [facility]) [address], [place] [address]  Please note: We ask at that you not bring children with you during ultrasound (echo/ vascular) testing. Due to room size and safety concerns, children are not allowed in the ultrasound rooms during exams. Our front office staff cannot provide observation of children in our lobby area while testing is being conducted. An adult accompanying a patient to their appointment will only be allowed in the ultrasound room at the discretion of the ultrasound technician under special circumstances. We apologize for any inconvenience.  ZIO XT- Long Term Monitor Instructions  Your physician has requested you wear a ZIO patch monitor for 14 days.  This is a single patch monitor. [employer] supplies one patch monitor per enrollment. Additional stickers are not available. Please do not apply patch if you will be having a Nuclear Stress Test, Echocardiogram, Cardiac CT, MRI, or Chest Xray during the period you would be wearing the monitor. The patch cannot be worn during these tests. You cannot remove and re-apply the ZIO XT patch monitor.  Your ZIO patch monitor will be mailed 3 day USPS to your address on file. It may take 3-5 days to receive your monitor after you have been enrolled. Once you have received your monitor, please review the enclosed  instructions. Your monitor has already been registered assigning a specific monitor serial number to you.  Billing and Patient Assistance Program Information  We have supplied [employer] with any of your insurance information on file for billing purposes.  [employer] offers a sliding scale Patient Assistance Program for patients that do not have insurance, or whose insurance does not completely cover the cost of the ZIO monitor.  You must apply for the Patient Assistance Program to qualify for this  discounted rate.  To apply, please call [employer] at [phone], select option 4, select option 2, ask to apply for Patient Assistance Program. Meredeth will ask your household income, and how many people are in your household. They will quote your out-of-pocket cost based on that information. [employer] will also be able to set up a 10-month, interest-free payment plan if needed.  Applying the monitor   Shave hair from upper left chest.  Hold abrader disc by orange tab. Rub abrader in 40 strokes over the upper left chest as indicated in your monitor instructions.  Clean area with 4 enclosed alcohol  pads. Let dry.  Apply patch as indicated in monitor instructions. Patch will be placed under collarbone on left side of chest with arrow pointing upward.  Rub patch adhesive wings for 2 minutes. Remove white label marked 1. Remove the white label marked 2. Rub patch adhesive wings for 2 additional minutes.  While looking in a mirror, press and release button in center of patch. A small green light will flash 3-4 times. This will be your only indicator that the monitor has been turned on.  Do not shower for the first 24 hours. You may shower after the first 24 hours.  Press the button if you feel a symptom. You will hear a small click. Record Date, Time and Symptom in the Patient Logbook.  When you are ready to remove the patch, follow instructions on the last 2 pages of Patient Logbook.  Stick patch monitor into the tabs at the bottom of the return box.  Place Patient Logbook in the blue and white box. Use locking tab on box and tape box closed securely. The blue and white box has prepaid postage on it. Please place it in the mailbox as soon as possible. Your physician should have your test results approximately 7-14 days after the monitor has been mailed back to [facility].  Call [facility] Customer Care at [phone] if you have questions regarding your ZIO XT patch monitor.  Call them  immediately if you see an orange light blinking on your monitor.  If your monitor falls off in less than 4 days, contact our Monitor department at [phone].  If your monitor becomes loose or falls off after 4 days call [employer] at [phone] for suggestions on securing your monitor.   Follow-Up: At [facility], you and your health needs are our priority.  As part of our continuing mission to provide you with exceptional heart care, our providers are all part of one team.  This team includes your primary Cardiologist (physician) and Advanced Practice Providers or APPs (Physician Assistants and Nurse Practitioners) who all work together to provide you with the care you need, when you need it.  Your next appointment:   2 month(s)  Provider:   You may see Deatrice Cage, MD or Cadence Franchester, [place]   We recommend signing up for the patient portal called MyChart.  Sign up information is provided on this After Visit Summary.  MyChart is used to  connect with patients for Virtual Visits (Telemedicine).  Patients are able to view lab/test results, encounter notes, upcoming appointments, etc.  Non-urgent messages can be sent to your provider as well.   To learn more about what you can do with MyChart, go to [email or web address].

## 2024-10-09 ENCOUNTER — Ambulatory Visit: Payer: Self-pay | Admitting: Medical

## 2024-10-09 LAB — MAGNESIUM: Magnesium: 2 mg/dL (ref 1.6–2.3)

## 2024-10-09 LAB — TSH: TSH: 4.43 u[IU]/mL (ref 0.450–4.500)

## 2024-10-23 LAB — BASIC METABOLIC PANEL WITH GFR
BUN/Creatinine Ratio: 17 (ref 10–24)
BUN: 27 mg/dL (ref 8–27)
CO2: 26 mmol/L (ref 20–29)
Calcium: 9.9 mg/dL (ref 8.6–10.2)
Chloride: 103 mmol/L (ref 96–106)
Creatinine, Ser: 1.59 mg/dL — ABNORMAL HIGH (ref 0.76–1.27)
Glucose: 89 mg/dL (ref 70–99)
Potassium: 4.6 mmol/L (ref 3.5–5.2)
Sodium: 146 mmol/L — ABNORMAL HIGH (ref 134–144)
eGFR: 44 mL/min/1.73 — ABNORMAL LOW (ref >59)

## 2024-10-23 MED ORDER — FUROSEMIDE 20 MG PO TABS: 20.0000 mg | ORAL_TABLET | ORAL | 3 refills | Status: AC

## 2024-10-27 ENCOUNTER — Telehealth: Payer: Self-pay | Admitting: Cardiovascular Disease

## 2024-10-27 NOTE — Telephone Encounter (Signed)
 Pts home health nurse calling concerned about patients recent weight gain. He has gained 10 pounds since 12/16. Nurse states his lasix  was reduced to every other day. Nurse denies any swelling. Nurse ask pt receive c/b. Please advise.

## 2024-10-27 NOTE — Telephone Encounter (Signed)
 I spoke with Dakota Harper after receiving a call from his [facility] Nurse Marcus in regards to a recent weight gain of 10 pounds since 10/21/2024. Patient states that he believes his weight gain is due to eating a large amount of ice cream recently and that his weight today was 180.4 pounds. Patient states that he does not have any new shortness of breath and no swelling. Lasix  was recently reduced to 20 mg every other day. Attempted to call the Home Health Nurse at the number the patient provided [phone].)

## 2024-11-07 ENCOUNTER — Emergency Department
Admission: EM | Admit: 2024-11-07 | Discharge: 2024-11-07 | Disposition: A | Discharge status: Home / Self Care | Source: Home / Self Care

## 2024-11-07 ENCOUNTER — Other Ambulatory Visit: Payer: Self-pay

## 2024-11-07 DIAGNOSIS — N189 Chronic kidney disease, unspecified: Secondary | ICD-10-CM | POA: Diagnosis not present

## 2024-11-07 DIAGNOSIS — I129 Hypertensive chronic kidney disease with stage 1 through stage 4 chronic kidney disease, or unspecified chronic kidney disease: Secondary | ICD-10-CM | POA: Insufficient documentation

## 2024-11-07 DIAGNOSIS — Y92009 Unspecified place in unspecified non-institutional (private) residence as the place of occurrence of the external cause: Secondary | ICD-10-CM | POA: Diagnosis not present

## 2024-11-07 DIAGNOSIS — J449 Chronic obstructive pulmonary disease, unspecified: Secondary | ICD-10-CM | POA: Diagnosis not present

## 2024-11-07 DIAGNOSIS — W1830XA Fall on same level, unspecified, initial encounter: Secondary | ICD-10-CM | POA: Diagnosis not present

## 2024-11-07 DIAGNOSIS — I959 Hypotension, unspecified: Secondary | ICD-10-CM | POA: Diagnosis present

## 2024-11-07 DIAGNOSIS — I951 Orthostatic hypotension: Secondary | ICD-10-CM | POA: Diagnosis not present

## 2024-11-07 DIAGNOSIS — W19XXXA Unspecified fall, initial encounter: Secondary | ICD-10-CM

## 2024-11-07 DIAGNOSIS — E039 Hypothyroidism, unspecified: Secondary | ICD-10-CM | POA: Diagnosis not present

## 2024-11-07 DIAGNOSIS — R42 Dizziness and giddiness: Secondary | ICD-10-CM

## 2024-11-07 LAB — CBC
HCT: 45.5 % (ref 39.0–52.0)
Hemoglobin: 15.1 g/dL (ref 13.0–17.0)
MCH: 31.1 pg (ref 26.0–34.0)
MCHC: 33.2 g/dL (ref 30.0–36.0)
MCV: 93.6 fL (ref 80.0–100.0)
Platelets: 259 K/uL (ref 150–400)
RBC: 4.86 MIL/uL (ref 4.22–5.81)
RDW: 13.8 % (ref 11.5–15.5)
WBC: 10.5 K/uL (ref 4.0–10.5)
nRBC: 0 % (ref 0.0–0.2)

## 2024-11-07 LAB — COMPREHENSIVE METABOLIC PANEL WITH GFR
ALT: 14 U/L (ref 0–44)
AST: 23 U/L (ref 15–41)
Albumin: 4.2 g/dL (ref 3.5–5.0)
Alkaline Phosphatase: 66 U/L (ref 38–126)
Anion gap: 12 (ref 5–15)
BUN: 28 mg/dL — ABNORMAL HIGH (ref 8–23)
CO2: 28 mmol/L (ref 22–32)
Calcium: 10.2 mg/dL (ref 8.9–10.3)
Chloride: 102 mmol/L (ref 98–111)
Creatinine, Ser: 1.7 mg/dL — ABNORMAL HIGH (ref 0.61–1.24)
GFR, Estimated: 41 mL/min — ABNORMAL LOW
Glucose, Bld: 122 mg/dL — ABNORMAL HIGH (ref 70–99)
Potassium: 4.1 mmol/L (ref 3.5–5.1)
Sodium: 143 mmol/L (ref 135–145)
Total Bilirubin: 0.4 mg/dL (ref 0.0–1.2)
Total Protein: 7 g/dL (ref 6.5–8.1)

## 2024-11-07 MED ORDER — ALUM & MAG HYDROXIDE-SIMETH 200-200-20 MG/5ML PO SUSP
30.0000 mL | Freq: Once | ORAL | Status: AC
  Administered 2024-11-07: 30 mL via ORAL
  Filled 2024-11-07: qty 30

## 2024-11-07 MED ORDER — SODIUM CHLORIDE 0.9 % IV BOLUS
1000.0000 mL | Freq: Once | INTRAVENOUS | Status: AC
  Administered 2024-11-07: 1000 mL via INTRAVENOUS

## 2024-11-07 NOTE — ED Triage Notes (Signed)
 First nurse note: Per EMT report, patient had two falls today and declined transport with the first fall. Per report, patient believes he is falling due to hypotension. Patient took a medication after first fall to raise blood pressure per MD's instructions. Patient denies pain to EMT. EMT reports patient is unsteady when standing and with using his walker at home. EMT reports patient lives alone.

## 2024-11-07 NOTE — ED Triage Notes (Signed)
 Pt to ED via [facility] for c/o falling twice today. Pt denies hitting head. Pt denies pain at this time.

## 2024-11-07 NOTE — ED Provider Notes (Signed)
 "  [facility] Provider Note    Event Date/Time   First MD Initiated Contact with Patient 11/07/24 1814     (approximate)   History   Fall   HPI  Dakota Harper is a 79 y.o. male with a past medical history of COPD, hypertension, hypercholesterolemia, hypothyroidism, CKD, presenting to the emergency department via EMS after 2 falls today.  Patient reports that he has a history of hypotension that he believes that his blood pressure is why he is falling.  He reports that when he goes from a sitting position into a standing position he will become unsteady on his feet and fall.  He denies any injuries from the fall and denies hitting his head.  He reports that he thinks he is dehydrated because he has not been drinking enough water.     Physical Exam   Triage Vital Signs: ED Triage Vitals  Encounter Vitals Group     BP 11/07/24 1606 117/67     Girls Systolic BP Percentile --      Girls Diastolic BP Percentile --      Boys Systolic BP Percentile --      Boys Diastolic BP Percentile --      Pulse Rate 11/07/24 1606 83     Resp 11/07/24 1606 16     Temp 11/07/24 1606 97.9 F (36.6 C)     Temp src --      SpO2 11/07/24 1606 95 %     Weight 11/07/24 1608 180 lb (81.6 kg)     Height 11/07/24 1608 5' 8 (1.727 m)     Head Circumference --      Peak Flow --      Pain Score 11/07/24 1606 0     Pain Loc --      Pain Education --      Exclude from Growth Chart --     Most recent vital signs: Vitals:   11/07/24 1606  BP: 117/67  Pulse: 83  Resp: 16  Temp: 97.9 F (36.6 C)  SpO2: 95%     General: Awake, no distress.  CV:  Good peripheral perfusion.  Resp:  Normal effort.  Abd:  No distention.  Other:  Dry mucous membranes   ED Results / Procedures / Treatments   Labs (all labs ordered are listed, but only abnormal results are displayed) Labs Reviewed  COMPREHENSIVE METABOLIC PANEL WITH GFR - Abnormal; Notable for the following components:       Result Value   Glucose, Bld 122 (*)    BUN 28 (*)    Creatinine, Ser 1.70 (*)    GFR, Estimated 41 (*)    All other components within normal limits  CBC     EKG ED ECG REPORT I, Reche CHRISTELLA Leventhal, the attending physician, personally viewed and interpreted this ECG.  Date: 11/07/2024 Rate: 84 bpm Rhythm: normal sinus rhythm QRS Axis: Left axis deviation Intervals: normal ST/T Wave abnormalities: Right bundle branch block Narrative Interpretation: no evidence of acute ischemia     RADIOLOGY     PROCEDURES:  Critical Care performed: No  Procedures   MEDICATIONS ORDERED IN ED: Medications - No data to display   IMPRESSION / MDM / ASSESSMENT AND PLAN / ED COURSE  I reviewed the triage vital signs and the nursing notes.  Differential diagnosis includes, but is not limited to, orthostatic hypotension, dehydration, stroke, sepsis.  Patient's presentation is most consistent with exacerbation of chronic illness.  Patient is a 79 year old male with a past medical history of hypertension, hyperlipidemia, COPD, hypothyroidism, CKD, presenting to the emergency department via EMS for 2 falls today which the patient believes is related to orthostatic hypotension.  On physical exam the patient is noted to have dry mucous membranes.  CBC and CMP were collected in triage.  CBC is overall unremarkable.  On CMP the patient's kidney function does appear to be slightly worsened which may be related to dehydration.  Discussed with the patient plan for treatment with IV fluids and reevaluation and he is agreeable.  He has no complaints at this time.  Patient's orthostatic vitals were positive.  Patient reports that after fluids he is feeling significantly improved.  He reports that he did have some pain that he thought was related to his reflux but that it has resolved at this time.  Patient reports that he is comfortable going home at this time.   Patient will be discharged with instructions to follow-up with his primary care physician within the next few days.  He will continue all of his home medications as prescribed.  Be sure to drink plenty of fluids.  Return to the emergency department for any new or worsening symptoms.      FINAL CLINICAL IMPRESSION(S) / ED DIAGNOSES   Final diagnoses:  Fall in home, initial encounter  Orthostatic lightheadedness     Rx / DC Orders   ED Discharge Orders     None        Note:  This document was prepared using Dragon voice recognition software and may include unintentional dictation errors.   Rexford Reche HERO, MD 11/07/24 2101  "

## 2024-11-07 NOTE — ED Notes (Addendum)
 Pt discharged by Redell, RN at this time. Redell removed pts IV before discharge.

## 2024-11-12 ENCOUNTER — Ambulatory Visit (INDEPENDENT_AMBULATORY_CARE_PROVIDER_SITE_OTHER)

## 2024-11-12 ENCOUNTER — Ambulatory Visit: Attending: Medical

## 2024-11-12 DIAGNOSIS — I9589 Other hypotension: Secondary | ICD-10-CM

## 2024-11-12 DIAGNOSIS — I10 Essential (primary) hypertension: Secondary | ICD-10-CM

## 2024-11-12 DIAGNOSIS — R9431 Abnormal electrocardiogram [ECG] [EKG]: Secondary | ICD-10-CM

## 2024-11-12 DIAGNOSIS — R55 Syncope and collapse: Secondary | ICD-10-CM | POA: Diagnosis not present

## 2024-11-12 LAB — ECHOCARDIOGRAM LIMITED
Area-P 1/2: 4.04 cm2
Calc EF: 56.7 %
S' Lateral: 3.4 cm
Single Plane A2C EF: 55.7 %
Single Plane A4C EF: 58.4 %

## 2024-11-13 LAB — BASIC METABOLIC PANEL WITH GFR
BUN/Creatinine Ratio: 13 (ref 10–24)
BUN: 18 mg/dL (ref 8–27)
CO2: 27 mmol/L (ref 20–29)
Calcium: 9.4 mg/dL (ref 8.6–10.2)
Chloride: 99 mmol/L (ref 96–106)
Creatinine, Ser: 1.43 mg/dL — ABNORMAL HIGH (ref 0.76–1.27)
Glucose: 81 mg/dL (ref 70–99)
Potassium: 4 mmol/L (ref 3.5–5.2)
Sodium: 140 mmol/L (ref 134–144)
eGFR: 50 mL/min/1.73 — ABNORMAL LOW

## 2024-11-17 ENCOUNTER — Other Ambulatory Visit: Payer: Self-pay

## 2024-11-17 ENCOUNTER — Emergency Department
Admission: EM | Admit: 2024-11-17 | Discharge: 2024-11-17 | Disposition: A | Discharge status: Home / Self Care | Attending: Emergency Medicine | Admitting: Emergency Medicine

## 2024-11-17 DIAGNOSIS — W19XXXA Unspecified fall, initial encounter: Secondary | ICD-10-CM

## 2024-11-17 DIAGNOSIS — I951 Orthostatic hypotension: Secondary | ICD-10-CM

## 2024-11-17 DIAGNOSIS — W1830XA Fall on same level, unspecified, initial encounter: Secondary | ICD-10-CM | POA: Insufficient documentation

## 2024-11-17 DIAGNOSIS — R42 Dizziness and giddiness: Secondary | ICD-10-CM | POA: Diagnosis present

## 2024-11-17 DIAGNOSIS — I10 Essential (primary) hypertension: Secondary | ICD-10-CM | POA: Diagnosis not present

## 2024-11-17 LAB — URINALYSIS, ROUTINE W REFLEX MICROSCOPIC
Bilirubin Urine: NEGATIVE
Glucose, UA: NEGATIVE mg/dL
Hgb urine dipstick: NEGATIVE
Ketones, ur: NEGATIVE mg/dL
Leukocytes,Ua: NEGATIVE
Nitrite: NEGATIVE
Protein, ur: NEGATIVE mg/dL
Specific Gravity, Urine: 1.012 (ref 1.005–1.030)
pH: 7 (ref 5.0–8.0)

## 2024-11-17 LAB — CBC WITH DIFFERENTIAL/PLATELET
Abs Immature Granulocytes: 0.18 K/uL — ABNORMAL HIGH (ref 0.00–0.07)
Basophils Absolute: 0.1 K/uL (ref 0.0–0.1)
Basophils Relative: 1 %
Eosinophils Absolute: 0.4 K/uL (ref 0.0–0.5)
Eosinophils Relative: 6 %
HCT: 41.9 % (ref 39.0–52.0)
Hemoglobin: 14.6 g/dL (ref 13.0–17.0)
Immature Granulocytes: 3 %
Lymphocytes Relative: 23 %
Lymphs Abs: 1.7 K/uL (ref 0.7–4.0)
MCH: 31.3 pg (ref 26.0–34.0)
MCHC: 34.8 g/dL (ref 30.0–36.0)
MCV: 89.7 fL (ref 80.0–100.0)
Monocytes Absolute: 0.8 K/uL (ref 0.1–1.0)
Monocytes Relative: 11 %
Neutro Abs: 4.1 K/uL (ref 1.7–7.7)
Neutrophils Relative %: 56 %
Platelets: 286 K/uL (ref 150–400)
RBC: 4.67 MIL/uL (ref 4.22–5.81)
RDW: 13.5 % (ref 11.5–15.5)
WBC: 7.1 K/uL (ref 4.0–10.5)
nRBC: 0 % (ref 0.0–0.2)

## 2024-11-17 LAB — COMPREHENSIVE METABOLIC PANEL WITH GFR
ALT: 14 U/L (ref 0–44)
AST: 24 U/L (ref 15–41)
Albumin: 3.7 g/dL (ref 3.5–5.0)
Alkaline Phosphatase: 63 U/L (ref 38–126)
Anion gap: 13 (ref 5–15)
BUN: 15 mg/dL (ref 8–23)
CO2: 25 mmol/L (ref 22–32)
Calcium: 9.5 mg/dL (ref 8.9–10.3)
Chloride: 102 mmol/L (ref 98–111)
Creatinine, Ser: 1.27 mg/dL — ABNORMAL HIGH (ref 0.61–1.24)
GFR, Estimated: 57 mL/min — ABNORMAL LOW
Glucose, Bld: 79 mg/dL (ref 70–99)
Potassium: 4 mmol/L (ref 3.5–5.1)
Sodium: 140 mmol/L (ref 135–145)
Total Bilirubin: 0.4 mg/dL (ref 0.0–1.2)
Total Protein: 6.3 g/dL — ABNORMAL LOW (ref 6.5–8.1)

## 2024-11-17 LAB — TROPONIN T, HIGH SENSITIVITY: Troponin T High Sensitivity: 23 ng/L — ABNORMAL HIGH (ref 0–19)

## 2024-11-17 LAB — CK: Total CK: 51 U/L (ref 49–397)

## 2024-11-17 LAB — LIPASE, BLOOD: Lipase: 17 U/L (ref 11–51)

## 2024-11-17 MED ORDER — MIDODRINE HCL 5 MG PO TABS: 2.5000 mg | ORAL_TABLET | Freq: Once | ORAL | Status: DC

## 2024-11-17 MED ORDER — LACTATED RINGERS IV BOLUS
500.0000 mL | Freq: Once | INTRAVENOUS | Status: AC
  Administered 2024-11-17: 500 mL via INTRAVENOUS

## 2024-11-17 NOTE — ED Triage Notes (Signed)
 Pt here from home VIA EMS for fall after feeling dizzy. EMS notes pt was on the floor on his back, pt estimates down for approx 20-25 min. Hx of orthostatic hypotension, HTN, HLD, BPH, and falls. Denies head strike/LOC/thinners/SI/HI/N/V/D/fever/CP/SOB/meds PTA.

## 2024-11-17 NOTE — ED Provider Notes (Signed)
 "  [facility] Provider Note    Event Date/Time   First MD Initiated Contact with Patient 11/17/24 [phone]     (approximate)   History   Fall   HPI  Dakota Harper is a 79 y.o. male who presents to the ED for evaluation of Fall   Review a cardiology clinic visit from last month.  History of HTN, orthostatic hypotension, frequent falls  Patient presents to the ED from home after a fall.  Reports getting up relatively quickly and blaming his orthostatic hypotension.  Reports he feels fine now and had no preceding illnesses or concerns.  No syncope   Physical Exam   Triage Vital Signs: ED Triage Vitals  Encounter Vitals Group     BP 11/17/24 0512 (!) 158/102     Girls Systolic BP Percentile --      Girls Diastolic BP Percentile --      Boys Systolic BP Percentile --      Boys Diastolic BP Percentile --      Pulse Rate 11/17/24 0514 86     Resp 11/17/24 0514 20     Temp 11/17/24 0514 98.3 F (36.8 C)     Temp Source 11/17/24 0514 Oral     SpO2 11/17/24 0514 100 %     Weight 11/17/24 0514 173 lb (78.5 kg)     Height 11/17/24 0514 5' 8 (1.727 m)     Head Circumference --      Peak Flow --      Pain Score 11/17/24 0514 0     Pain Loc --      Pain Education --      Exclude from Growth Chart --     Most recent vital signs: Vitals:   11/17/24 0512 11/17/24 0514  BP: (!) 158/102   Pulse:  86  Resp:  20  Temp:  98.3 F (36.8 C)  SpO2:  100%    General: Awake, no distress.  CV:  Good peripheral perfusion.  Resp:  Normal effort.  Abd:  No distention.  MSK:  No deformity noted.  Neuro:  No focal deficits appreciated. Other:     ED Results / Procedures / Treatments   Labs (all labs ordered are listed, but only abnormal results are displayed) Labs Reviewed  CBC WITH DIFFERENTIAL/PLATELET - Abnormal; Notable for the following components:      Result Value   Abs Immature Granulocytes 0.18 (*)    All other components within normal limits   COMPREHENSIVE METABOLIC PANEL WITH GFR - Abnormal; Notable for the following components:   Creatinine, Ser 1.27 (*)    Total Protein 6.3 (*)    GFR, Estimated 57 (*)    All other components within normal limits  URINALYSIS, ROUTINE W REFLEX MICROSCOPIC - Abnormal; Notable for the following components:   Color, Urine YELLOW (*)    APPearance CLEAR (*)    All other components within normal limits  TROPONIN T, HIGH SENSITIVITY - Abnormal; Notable for the following components:   Troponin T High Sensitivity 23 (*)    All other components within normal limits  CK  LIPASE, BLOOD    EKG Sinus rhythm with rate 75 bpm, right bundle, left anterior fascicular block, no STEMI  RADIOLOGY   Official radiology report(s): No results found.  PROCEDURES and INTERVENTIONS:  Procedures  Medications  lactated ringers  bolus 500 mL (500 mLs Intravenous New Bag/Given 11/17/24 0624)     IMPRESSION / MDM / ASSESSMENT AND  PLAN / ED COURSE  I reviewed the triage vital signs and the nursing notes.  Differential diagnosis includes, but is not limited to, orthostatic hypotension, cardiac dysrhythmia, TIA, UTI, viral syndrome  {Patient presents with symptoms of an acute illness or injury that is potentially life-threatening.  Patient presents after a fall after an episode of positional dizziness, likely related to his known orthostatic hypotension.  He presents to the ED well-appearing, asymptomatic and with a normal exam and a desire to go home.  Normal CBC, CK, UA, metabolic panel.  Will provide a small fluid bolus and ambulate the patient with anticipation of outpatient management  Clinical Course as of 11/17/24 0654  Mon Nov 17, 2024  0654 Reassessed, feels well and wants to go home [DS]    Clinical Course User Index [DS] Claudene Rover, MD     FINAL CLINICAL IMPRESSION(S) / ED DIAGNOSES   Final diagnoses:  Fall, initial encounter     Rx / DC Orders   ED Discharge Orders     None         Note:  This document was prepared using Dragon voice recognition software and may include unintentional dictation errors.   Claudene Rover, MD 11/17/24 [phone]  "

## 2024-11-19 NOTE — Telephone Encounter (Signed)
 Patient is returning call

## 2024-12-09 NOTE — Telephone Encounter (Signed)
 Pt returning call. Please advise.

## 2024-12-10 ENCOUNTER — Telehealth: Payer: Self-pay | Admitting: Medical

## 2024-12-10 ENCOUNTER — Ambulatory Visit: Admitting: Medical

## 2024-12-10 NOTE — Telephone Encounter (Signed)
 Pt would like a c/b regarding Echo results. Please advise

## 2024-12-10 NOTE — Telephone Encounter (Signed)
 Called patient reviewed echo results with patient - no further questions

## 2024-12-11 ENCOUNTER — Ambulatory Visit

## 2024-12-11 VITALS — BP 110/70 | HR 85 | Temp 97.7°F

## 2024-12-11 DIAGNOSIS — L299 Pruritus, unspecified: Secondary | ICD-10-CM

## 2024-12-11 DIAGNOSIS — L219 Seborrheic dermatitis, unspecified: Secondary | ICD-10-CM

## 2024-12-11 DIAGNOSIS — R42 Dizziness and giddiness: Secondary | ICD-10-CM

## 2024-12-11 MED ORDER — IVERMECTIN 1 % EX CREA: TOPICAL_CREAM | CUTANEOUS | 1 refills | Status: AC

## 2024-12-11 MED ORDER — CLOBETASOL PROPIONATE 0.05 % EX SOLN: CUTANEOUS | 5 refills | Status: AC

## 2024-12-11 NOTE — Patient Instructions (Signed)

## 2024-12-11 NOTE — Progress Notes (Signed)
" °  °  Subjective   Dakota Harper is a 79 y.o. male who presents for the following: Rash. Patient is established patient   Today patient reports: Rash in the scalp has been treated as mites in the past patient is not longer using Permiterin cream. He believes he has mites in scalp and would like treatment.   Patient c/o lightheadedness and dizziness apon arrival to the clinic; all vitals WNL; patient has taken all of his morning meds. He states that he has been dehydrated for the past couple of days. Has had multiple ER visits and admissions for pre syncope, falls.   Review of Systems:    No other skin or systemic complaints except as noted in HPI or Assessment and Plan.  The following portions of the chart were reviewed this encounter and updated as appropriate: medications, allergies, medical history  Relevant Medical History:  Personal history of non melanoma skin cancer - see medical history for full details   Objective  (SKPE) Well appearing patient in no apparent distress; mood and affect are within normal limits. Examination was performed of the: Focused Exam of: scalp   Examination notable for: Scalp with mild erythema and scale  Examination limited by: Clothing and Patient deferred removal       Assessment & Plan  (SKAP)   Seborrheic Dermatitis Scalp pruritus Chronic, flaring, not at goal  - Start clobetasol  solutions 0.05% twice daily to affected skin Discussed side effect of super potent topical steroids including atrophy, dyspigmentation, striae, telangectasia, folliculitis, loss of skin pigment, hair growth, tachyphylaxis, risk of systemic absorption with missuse. - Start Ivermectin  1% cream apply topically to affected areas of the scalp    Lightheaded, dizzy upon arrival to clinic - All vitals WNL. Previous notes reviewed with multiple ER visits, admissions for presyncope, falls. Patient improved to baseline after sitting, water. He denies any chest pain, SOB. He  would like to continue to monitor sx. Advised if sx recur would recommend going to ED for eval.   Was sun protection counseling provided?: No   Level of service outlined above   Patient instructions (SKPI)   Procedures, orders, diagnosis for this visit:    There are no diagnoses linked to this encounter.  Return to clinic: Return if symptoms worsen or fail to improve.  I, Emerick Ege, CMA am acting as scribe for Lauraine JAYSON Kanaris, MD.   Documentation: I have reviewed the above documentation for accuracy and completeness, and I agree with the above.  Lauraine JAYSON Kanaris, MD      "

## 2024-12-31 ENCOUNTER — Ambulatory Visit: Admitting: Medical

## 2025-02-24 ENCOUNTER — Ambulatory Visit: Admitting: Dermatology
